# Patient Record
Sex: Male | Born: 1960 | Race: White | Hispanic: No | Smoking: Former smoker
Health system: Southern US, Community
[De-identification: ages and names within clinical notes are randomized; demographics above are authoritative.]

## PROBLEM LIST (undated history)

## (undated) DIAGNOSIS — I739 Peripheral vascular disease, unspecified: Secondary | ICD-10-CM

## (undated) DIAGNOSIS — E119 Type 2 diabetes mellitus without complications: Secondary | ICD-10-CM

---

## 2018-06-10 ENCOUNTER — Ambulatory Visit: Payer: Self-pay | Admitting: Family Medicine

## 2018-06-10 ENCOUNTER — Encounter (HOSPITAL_COMMUNITY): Payer: Self-pay | Admitting: Emergency Medicine

## 2018-06-10 ENCOUNTER — Inpatient Hospital Stay (HOSPITAL_COMMUNITY)
Admission: EM | Admit: 2018-06-10 | Discharge: 2018-06-16 | DRG: 623 | Disposition: A | Discharge status: Home / Self Care | Payer: Self-pay | Attending: Internal Medicine | Admitting: Internal Medicine

## 2018-06-10 ENCOUNTER — Encounter: Payer: Self-pay | Admitting: Family Medicine

## 2018-06-10 ENCOUNTER — Other Ambulatory Visit: Payer: Self-pay

## 2018-06-10 ENCOUNTER — Emergency Department (HOSPITAL_COMMUNITY): Payer: Self-pay

## 2018-06-10 VITALS — BP 145/78 | HR 83 | Temp 98.9°F | Resp 18 | Ht 71.0 in | Wt 152.4 lb

## 2018-06-10 DIAGNOSIS — Z6821 Body mass index (BMI) 21.0-21.9, adult: Secondary | ICD-10-CM

## 2018-06-10 DIAGNOSIS — Z833 Family history of diabetes mellitus: Secondary | ICD-10-CM

## 2018-06-10 DIAGNOSIS — E119 Type 2 diabetes mellitus without complications: Secondary | ICD-10-CM

## 2018-06-10 DIAGNOSIS — E1165 Type 2 diabetes mellitus with hyperglycemia: Secondary | ICD-10-CM | POA: Diagnosis present

## 2018-06-10 DIAGNOSIS — L02619 Cutaneous abscess of unspecified foot: Secondary | ICD-10-CM

## 2018-06-10 DIAGNOSIS — I998 Other disorder of circulatory system: Secondary | ICD-10-CM | POA: Diagnosis present

## 2018-06-10 DIAGNOSIS — E1151 Type 2 diabetes mellitus with diabetic peripheral angiopathy without gangrene: Secondary | ICD-10-CM | POA: Diagnosis present

## 2018-06-10 DIAGNOSIS — S92912A Unspecified fracture of left toe(s), initial encounter for closed fracture: Secondary | ICD-10-CM | POA: Diagnosis present

## 2018-06-10 DIAGNOSIS — S91302A Unspecified open wound, left foot, initial encounter: Secondary | ICD-10-CM

## 2018-06-10 DIAGNOSIS — W25XXXA Contact with sharp glass, initial encounter: Secondary | ICD-10-CM | POA: Diagnosis present

## 2018-06-10 DIAGNOSIS — X58XXXA Exposure to other specified factors, initial encounter: Secondary | ICD-10-CM | POA: Diagnosis present

## 2018-06-10 DIAGNOSIS — K08409 Partial loss of teeth, unspecified cause, unspecified class: Secondary | ICD-10-CM | POA: Diagnosis present

## 2018-06-10 DIAGNOSIS — E440 Moderate protein-calorie malnutrition: Secondary | ICD-10-CM

## 2018-06-10 DIAGNOSIS — F17200 Nicotine dependence, unspecified, uncomplicated: Secondary | ICD-10-CM | POA: Diagnosis present

## 2018-06-10 DIAGNOSIS — L02612 Cutaneous abscess of left foot: Secondary | ICD-10-CM | POA: Diagnosis present

## 2018-06-10 DIAGNOSIS — L03119 Cellulitis of unspecified part of limb: Secondary | ICD-10-CM

## 2018-06-10 DIAGNOSIS — S91332A Puncture wound without foreign body, left foot, initial encounter: Secondary | ICD-10-CM | POA: Diagnosis present

## 2018-06-10 DIAGNOSIS — R0989 Other specified symptoms and signs involving the circulatory and respiratory systems: Secondary | ICD-10-CM

## 2018-06-10 DIAGNOSIS — I70202 Unspecified atherosclerosis of native arteries of extremities, left leg: Secondary | ICD-10-CM | POA: Diagnosis present

## 2018-06-10 DIAGNOSIS — E11628 Type 2 diabetes mellitus with other skin complications: Principal | ICD-10-CM | POA: Diagnosis present

## 2018-06-10 DIAGNOSIS — I10 Essential (primary) hypertension: Secondary | ICD-10-CM | POA: Diagnosis present

## 2018-06-10 DIAGNOSIS — L97509 Non-pressure chronic ulcer of other part of unspecified foot with unspecified severity: Secondary | ICD-10-CM

## 2018-06-10 DIAGNOSIS — E11621 Type 2 diabetes mellitus with foot ulcer: Secondary | ICD-10-CM

## 2018-06-10 DIAGNOSIS — L03116 Cellulitis of left lower limb: Secondary | ICD-10-CM | POA: Diagnosis present

## 2018-06-10 DIAGNOSIS — S025XXA Fracture of tooth (traumatic), initial encounter for closed fracture: Secondary | ICD-10-CM | POA: Diagnosis present

## 2018-06-10 DIAGNOSIS — I771 Stricture of artery: Secondary | ICD-10-CM | POA: Diagnosis present

## 2018-06-10 DIAGNOSIS — R739 Hyperglycemia, unspecified: Secondary | ICD-10-CM | POA: Diagnosis present

## 2018-06-10 DIAGNOSIS — L0390 Cellulitis, unspecified: Secondary | ICD-10-CM | POA: Diagnosis present

## 2018-06-10 LAB — CBC WITH DIFFERENTIAL/PLATELET
Basophils Absolute: 0.1 10*3/uL (ref 0.0–0.1)
Basophils Relative: 0 %
Eosinophils Absolute: 0.1 10*3/uL (ref 0.0–0.7)
Eosinophils Relative: 1 %
HCT: 45.4 % (ref 39.0–52.0)
Hemoglobin: 15.9 g/dL (ref 13.0–17.0)
Lymphocytes Relative: 19 %
Lymphs Abs: 2.4 10*3/uL (ref 0.7–4.0)
MCH: 31.9 pg (ref 26.0–34.0)
MCHC: 35 g/dL (ref 30.0–36.0)
MCV: 91 fL (ref 78.0–100.0)
Monocytes Absolute: 1.1 10*3/uL — ABNORMAL HIGH (ref 0.1–1.0)
Monocytes Relative: 9 %
Neutro Abs: 9.3 10*3/uL — ABNORMAL HIGH (ref 1.7–7.7)
Neutrophils Relative %: 71 %
Platelets: 382 10*3/uL (ref 150–400)
RBC: 4.99 MIL/uL (ref 4.22–5.81)
RDW: 11.9 % (ref 11.5–15.5)
WBC: 12.9 10*3/uL — ABNORMAL HIGH (ref 4.0–10.5)

## 2018-06-10 LAB — CG4 I-STAT (LACTIC ACID)
Lactic Acid, Venous: 2.56 mmol/L (ref 0.5–1.9)
Lactic Acid, Venous: 2.41 mmol/L (ref 0.5–1.9)

## 2018-06-10 LAB — COMPREHENSIVE METABOLIC PANEL
ALT: 14 U/L (ref 0–44)
AST: 17 U/L (ref 15–41)
Albumin: 3.7 g/dL (ref 3.5–5.0)
Alkaline Phosphatase: 111 U/L (ref 38–126)
Anion gap: 15 (ref 5–15)
BUN: 13 mg/dL (ref 6–20)
CO2: 26 mmol/L (ref 22–32)
Calcium: 10 mg/dL (ref 8.9–10.3)
Chloride: 96 mmol/L — ABNORMAL LOW (ref 98–111)
Creatinine, Ser: 0.9 mg/dL (ref 0.61–1.24)
GFR calc Af Amer: >60 mL/min (ref >60)
GFR calc non Af Amer: >60 mL/min (ref >60)
Glucose, Bld: 250 mg/dL — ABNORMAL HIGH (ref 70–99)
Potassium: 4.6 mmol/L (ref 3.5–5.1)
Sodium: 137 mmol/L (ref 135–145)
Total Bilirubin: 0.6 mg/dL (ref 0.3–1.2)
Total Protein: 8.7 g/dL — ABNORMAL HIGH (ref 6.5–8.1)

## 2018-06-10 MED ORDER — SODIUM CHLORIDE 0.9 % IV SOLN
INTRAVENOUS | Status: AC
  Administered 2018-06-11: 01:00:00 via INTRAVENOUS

## 2018-06-10 MED ORDER — HYDROCODONE-ACETAMINOPHEN 5-325 MG PO TABS: 1.0000 | ORAL_TABLET | ORAL | Status: DC | PRN

## 2018-06-10 MED ORDER — ONDANSETRON HCL 4 MG/2ML IJ SOLN: 4.0000 mg | Freq: Four times a day (QID) | INTRAMUSCULAR | Status: DC | PRN

## 2018-06-10 MED ORDER — ONDANSETRON HCL 4 MG PO TABS: 4.0000 mg | ORAL_TABLET | Freq: Four times a day (QID) | ORAL | Status: DC | PRN

## 2018-06-10 MED ORDER — INSULIN ASPART 100 UNIT/ML ~~LOC~~ SOLN
0.0000 [IU] | SUBCUTANEOUS | Status: DC
  Administered 2018-06-11: 2 [IU] via SUBCUTANEOUS
  Administered 2018-06-11: 5 [IU] via SUBCUTANEOUS
  Administered 2018-06-11: 2 [IU] via SUBCUTANEOUS
  Administered 2018-06-11 (×2): 1 [IU] via SUBCUTANEOUS
  Administered 2018-06-12: 2 [IU] via SUBCUTANEOUS
  Administered 2018-06-12 (×2): 1 [IU] via SUBCUTANEOUS
  Administered 2018-06-13 (×3): 2 [IU] via SUBCUTANEOUS
  Administered 2018-06-14 (×3): 1 [IU] via SUBCUTANEOUS

## 2018-06-10 MED ORDER — SODIUM CHLORIDE 0.9 % IV BOLUS
1000.0000 mL | Freq: Once | INTRAVENOUS | Status: AC
  Administered 2018-06-10: 1000 mL via INTRAVENOUS

## 2018-06-10 MED ORDER — ENOXAPARIN SODIUM 40 MG/0.4ML ~~LOC~~ SOLN
40.0000 mg | Freq: Every day | SUBCUTANEOUS | Status: DC
  Administered 2018-06-11: 40 mg via SUBCUTANEOUS
  Filled 2018-06-10: qty 0.4

## 2018-06-10 MED ORDER — ACETAMINOPHEN 325 MG PO TABS: 650.0000 mg | ORAL_TABLET | Freq: Four times a day (QID) | ORAL | Status: DC | PRN

## 2018-06-10 MED ORDER — PIPERACILLIN-TAZOBACTAM 3.375 G IVPB 30 MIN
3.3750 g | Freq: Once | INTRAVENOUS | Status: AC
  Administered 2018-06-10: 3.375 g via INTRAVENOUS
  Filled 2018-06-10: qty 50

## 2018-06-10 MED ORDER — METRONIDAZOLE IN NACL 5-0.79 MG/ML-% IV SOLN
500.0000 mg | Freq: Three times a day (TID) | INTRAVENOUS | Status: DC
  Administered 2018-06-11 – 2018-06-14 (×9): 500 mg via INTRAVENOUS
  Filled 2018-06-10 (×9): qty 100

## 2018-06-10 MED ORDER — TETANUS-DIPHTH-ACELL PERTUSSIS 5-2.5-18.5 LF-MCG/0.5 IM SUSP
0.5000 mL | Freq: Once | INTRAMUSCULAR | Status: AC
  Administered 2018-06-10: 0.5 mL via INTRAMUSCULAR
  Filled 2018-06-10: qty 0.5

## 2018-06-10 MED ORDER — VANCOMYCIN HCL 10 G IV SOLR
1500.0000 mg | Freq: Once | INTRAVENOUS | Status: AC
  Administered 2018-06-10: 1500 mg via INTRAVENOUS
  Filled 2018-06-10: qty 1500

## 2018-06-10 MED ORDER — SODIUM CHLORIDE 0.9 % IV SOLN
2.0000 g | Freq: Every day | INTRAVENOUS | Status: DC
  Administered 2018-06-11 – 2018-06-14 (×4): 2 g via INTRAVENOUS
  Filled 2018-06-10 (×2): qty 2
  Filled 2018-06-10: qty 20
  Filled 2018-06-10: qty 2

## 2018-06-10 MED ORDER — ACETAMINOPHEN 650 MG RE SUPP: 650.0000 mg | Freq: Four times a day (QID) | RECTAL | Status: DC | PRN

## 2018-06-10 NOTE — Progress Notes (Signed)
Pharmacy Note   A consult was received from an ED physician for vancomycin per pharmacy dosing.    The patient's profile has been reviewed for ht/wt/allergies/indication/available labs.    A one time order has been placed for vancomycin 1500 mg IV x1 .  Further antibiotics/pharmacy consults should be ordered by admitting physician if indicated.                       Thank you,  Royetta Asal, PharmD, BCPS Pager [phone] 06/10/2018 9:12 PM

## 2018-06-10 NOTE — ED Provider Notes (Signed)
[facility] DEPT Provider Note   CSN: [id number] Arrival date & time: 06/10/18  1805     History   Chief Complaint Chief Complaint  Patient presents with  . Foot Swelling    HPI Stanley Chaney is a 57 y.o. male presents today for evaluation of acute onset, progressively worsening left foot pain and wound for 1 week.  Sent by PCP for further evaluation.  He states that he was visiting his son about a week ago when he began to feel pain and swelling in his left foot.  He applied ice over the area without relief in his symptoms.  Since then he has had progressively worsening throbbing pain.  Today he noticed really drainage from a wound which he thinks has been developing over the last week.  He denies any known trauma.  Notes numbness and tingling to the area as well which he thinks is due to swelling.  Denies fevers, chills, nausea, vomiting, diaphoresis.  He has been living in the [place] for the past 20 years and has not been seen by a physician in at least that amount of time.  Went to see a primary care physician earlier today for evaluation who promptly sent him to the ED for further evaluation. At the time, he documented there "appears to be a puncture wound at base of the foot at the arch with surrounding blistering and air under the skin that is draining sanguinous fluid with slight odor as well as some thin yellow exudate, does have a bullous type lesions centrally with some fluid under the skin medially, diffuse soft tissue swelling throughout mid foot that moves up to the ankle as well as slightly to the lateral foot, diffuse erythema across the foot primarily medial foot to the medial ankle". He is primarily [place] speaking, a Optometrist was used throughout the encounter.  The history is provided by the patient. The history is limited by a language barrier. A language interpreter was used.    History reviewed. No pertinent past medical  history.  Patient Active Problem List   Diagnosis Date Noted  . Cellulitis of left foot 06/10/2018  . Diabetes mellitus ([place]) 06/10/2018  . Hyperglycemia 06/10/2018  . Cellulitis 06/10/2018    History reviewed. No pertinent surgical history.      Home Medications    Prior to Admission medications   Medication Sig Start Date End Date Taking? Authorizing Provider  acetaminophen (TYLENOL) 500 MG tablet Take 500 mg by mouth every 6 (six) hours as needed.   Yes [provider]    Family History Family History  Problem Relation Age of Onset  . Diabetes Mother   . Cancer Mother     Social History Social History   Tobacco Use  . Smoking status: Current Every Day Smoker  . Smokeless tobacco: Never Used  Substance Use Topics  . Alcohol use: Not Currently  . Drug use: Not on file     Allergies   Patient has no known allergies.   Review of Systems Review of Systems  Constitutional: Negative for chills, diaphoresis and fever.  Musculoskeletal: Positive for arthralgias.  Skin: Positive for color change and wound.  Neurological: Positive for numbness.  All other systems reviewed and are negative.    Physical Exam Updated Vital Signs BP (!) 153/94 (BP Location: Left Arm)   Pulse 87   Temp (!) 97.5 F (36.4 C) (Oral)   Resp 18   SpO2 100%   Physical Exam  Constitutional: He appears well-developed and well-nourished. No distress.  HENT:  Head: Normocephalic and atraumatic.  Eyes: Conjunctivae are normal. Right eye exhibits no discharge. Left eye exhibits no discharge.  Neck: No JVD present. No tracheal deviation present.  Cardiovascular: Normal rate and intact distal pulses.  2+ DP/PT pulses bilaterally.  1+ pitting edema of the left lower extremity near the ankle  Pulmonary/Chest: Effort normal.  Abdominal: He exhibits no distension.  Musculoskeletal: He exhibits no edema.  5/5 strength of BLE major muscle groups. onychomycotic toes bilaterally.   Neurological: He is alert.  Skin: Skin is warm and dry. Capillary refill takes less than 2 seconds. There is erythema.  See below images.  Patient has what appears to be a puncture wound to the plantar aspect of the left medial foot with blistering extending to the medial arch and erythema and swelling extending to the medial aspect of the foot and distal lower leg.  Tender to palpation overlying this area.  No crepitus noted.  Draining serosanguineous fluid  Psychiatric: He has a normal mood and affect. His behavior is normal.  Nursing note and vitals reviewed.        ED Treatments / Results  Labs (all labs ordered are listed, but only abnormal results are displayed) Labs Reviewed  COMPREHENSIVE METABOLIC PANEL - Abnormal; Notable for the following components:      Result Value   Chloride 96 (*)    Glucose, Bld 250 (*)    Total Protein 8.7 (*)    All other components within normal limits  CBC WITH DIFFERENTIAL/PLATELET - Abnormal; Notable for the following components:   WBC 12.9 (*)    Neutro Abs 9.3 (*)    Monocytes Absolute 1.1 (*)    All other components within normal limits  CG4 I-STAT (LACTIC ACID) - Abnormal; Notable for the following components:   Lactic Acid, Venous 2.56 (*)    All other components within normal limits  CG4 I-STAT (LACTIC ACID) - Abnormal; Notable for the following components:   Lactic Acid, Venous 2.41 (*)    All other components within normal limits  CULTURE, BLOOD (ROUTINE X 2)  CULTURE, BLOOD (ROUTINE X 2)  URINALYSIS, ROUTINE W REFLEX MICROSCOPIC  LIPID PANEL  I-STAT CG4 LACTIC ACID, ED  I-STAT CG4 LACTIC ACID, ED    EKG None  Radiology Dg Foot Complete Left  Result Date: 06/10/2018 CLINICAL DATA:  Plantar foot wound, swelling EXAM: LEFT FOOT - COMPLETE 3+ VIEW COMPARISON:  None. FINDINGS: Fracture involving the proximal shaft of the 5th proximal phalanx. Mild degenerative changes involving the 1st MTP joint. Visualized soft tissues are  grossly unremarkable in this patient with known plantar soft tissue wound. No radiopaque foreign body is seen. IMPRESSION: Fracture involving the proximal shaft of the 5th proximal phalanx. Electronically Signed   By: Julian Hy M.D.   On: 06/10/2018 19:20    Procedures Procedures (including critical care time) EMERGENCY DEPARTMENT US SOFT TISSUE INTERPRETATION "Study: Limited Soft Tissue Ultrasound"  INDICATIONS: Soft tissue infection Multiple views of the body part were obtained in real-time with a multi-frequency linear probe  PERFORMED BY: Myself IMAGES ARCHIVED?: Yes SIDE:Left BODY PART:Lower extremity INTERPRETATION:  Abcess present, Cellulitis present and possible foreign body    Medications Ordered in ED Medications  vancomycin (VANCOCIN) 1,500 mg in sodium chloride 0.9 % 500 mL IVPB (1,500 mg Intravenous Transfusing/Transfer 06/10/18 2322)  sodium chloride 0.9 % bolus 1,000 mL (0 mLs Intravenous Stopped 06/10/18 2305)  Tdap (BOOSTRIX) injection 0.5 mL (  0.5 mLs Intramuscular Given 06/10/18 2140)  piperacillin-tazobactam (ZOSYN) IVPB 3.375 g (0 g Intravenous Stopped 06/10/18 2218)     Initial Impression / Assessment and Plan / ED Course  I have reviewed the triage vital signs and the nursing notes.  Pertinent labs & imaging results that were available during my care of the patient were reviewed by me and considered in my medical decision making (see chart for details).     Patient presents for evaluation of progressively worsening left lower extremity puncture wound with surrounding cellulitis.  He is afebrile, mildly hypertensive in the ED.  Decreased sensation noted on palpation of the foot though he is tender to palpation surrounding the infected area.  Found to have a leukocytosis of 12.9, elevated blood glucose of 250 suggesting undiagnosed diabetes.  He also has an elevated lactic acid of 2.56.  Blood cultures were obtained and IV antibiotics were initiated.   Bedside ultrasound performed showing possible foreign body.  Will require admission for further evaluation and management.  Spoke with Dr. Roel Cluck who agrees to assume care of patient and bring him into the hospital for further evaluation and management. She will consult orthopedics for further recommendations. Patient seen and evaluated by Dr. Winfred Leeds who agrees with assessment and plan at this time.   Final Clinical Impressions(s) / ED Diagnoses   Final diagnoses:  Foot abscess, left  Cellulitis of left lower extremity    ED Discharge Orders    None       Debroah Baller 06/10/18 2339    Orlie Dakin, MD 06/10/18 2351

## 2018-06-10 NOTE — ED Triage Notes (Signed)
Pt sent from [facility] for left foot swelling and possible infection for week. Reports very painful.

## 2018-06-10 NOTE — ED Notes (Signed)
ED TO INPATIENT HANDOFF REPORT  Name/Age/Gender Stanley Chaney 57 y.o. male  Code Status   Home/SNF/Other Home  Chief Complaint Left leg swelling  Level of Care/Admitting Diagnosis ED Disposition    ED Disposition Condition [facility] Area: [place] [[id number]]  Level of Care: Med-Surg [16]  Diagnosis: Cellulitis [563875]  Admitting Physician: Toy Baker [[id number]]  Attending Physician: Toy Baker [[id number]]  Estimated length of stay: past midnight tomorrow  Certification:: I certify this patient will need inpatient services for at least 2 midnights  PT Class (Do Not Modify): Inpatient [101]  PT Acc Code (Do Not Modify): Private [1]       Medical History History reviewed. No pertinent past medical history.  Allergies No Known Allergies  IV Location/Drains/Wounds Patient Lines/Drains/Airways Status   Active Line/Drains/Airways    Name:   Placement date:   Placement time:   Site:   Days:   Peripheral IV 06/10/18 Right;Medial Forearm   06/10/18    2136    Forearm   less than 1   Peripheral IV 06/10/18 Left Hand   06/10/18    2159    Hand   less than 1          Labs/Imaging Results for orders placed or performed during the hospital encounter of 06/10/18 (from the past 48 hour(s))  Comprehensive metabolic panel     Status: Abnormal   Collection Time: 06/10/18  7:08 PM  Result Value Ref Range   Sodium 137 135 - 145 mmol/L   Potassium 4.6 3.5 - 5.1 mmol/L   Chloride 96 (L) 98 - 111 mmol/L   CO2 26 22 - 32 mmol/L   Glucose, Bld 250 (H) 70 - 99 mg/dL   BUN 13 6 - 20 mg/dL   Creatinine, Ser 0.90 0.61 - 1.24 mg/dL   Calcium 10.0 8.9 - 10.3 mg/dL   Total Protein 8.7 (H) 6.5 - 8.1 g/dL   Albumin 3.7 3.5 - 5.0 g/dL   AST 17 15 - 41 U/L   ALT 14 0 - 44 U/L   Alkaline Phosphatase 111 38 - 126 U/L   Total Bilirubin 0.6 0.3 - 1.2 mg/dL   GFR calc non Af Amer >60 >60 mL/min   GFR calc Af Amer >60 >60 mL/min    Comment:  (NOTE) The eGFR has been calculated using the CKD EPI equation. This calculation has not been validated in all clinical situations. eGFR's persistently <60 mL/min signify possible Chronic Kidney Disease.    Anion gap 15 5 - 15    Comment: Performed at [facility], [place] [address] [place], [place] [address]  CBC with Differential     Status: Abnormal   Collection Time: 06/10/18  7:08 PM  Result Value Ref Range   WBC 12.9 (H) 4.0 - 10.5 K/uL   RBC 4.99 4.22 - 5.81 MIL/uL   Hemoglobin 15.9 13.0 - 17.0 g/dL   HCT 45.4 39.0 - 52.0 %   MCV 91.0 78.0 - 100.0 fL   MCH 31.9 26.0 - 34.0 pg   MCHC 35.0 30.0 - 36.0 g/dL   RDW 11.9 11.5 - 15.5 %   Platelets 382 150 - 400 K/uL   Neutrophils Relative % 71 %   Neutro Abs 9.3 (H) 1.7 - 7.7 K/uL   Lymphocytes Relative 19 %   Lymphs Abs 2.4 0.7 - 4.0 K/uL   Monocytes Relative 9 %   Monocytes Absolute 1.1 (H) 0.1 - 1.0 K/uL  Eosinophils Relative 1 %   Eosinophils Absolute 0.1 0.0 - 0.7 K/uL   Basophils Relative 0 %   Basophils Absolute 0.1 0.0 - 0.1 K/uL    Comment: Performed at [facility], [place] [address] [place], [place] [address]  CG4 I-STAT (Lactic acid)     Status: Abnormal   Collection Time: 06/10/18  7:16 PM  Result Value Ref Range   Lactic Acid, Venous 2.56 (HH) 0.5 - 1.9 mmol/L   Comment NOTIFIED PHYSICIAN   CG4 I-STAT (Lactic acid)     Status: Abnormal   Collection Time: 06/10/18  8:46 PM  Result Value Ref Range   Lactic Acid, Venous 2.41 (HH) 0.5 - 1.9 mmol/L   Comment NOTIFIED PHYSICIAN    Dg Foot Complete Left  Result Date: 06/10/2018 CLINICAL DATA:  Plantar foot wound, swelling EXAM: LEFT FOOT - COMPLETE 3+ VIEW COMPARISON:  None. FINDINGS: Fracture involving the proximal shaft of the 5th proximal phalanx. Mild degenerative changes involving the 1st MTP joint. Visualized soft tissues are grossly unremarkable in this patient with known plantar soft tissue wound. No radiopaque foreign body  is seen. IMPRESSION: Fracture involving the proximal shaft of the 5th proximal phalanx. Electronically Signed   By: Julian Hy M.D.   On: 06/10/2018 19:20    Pending Labs Unresulted Labs (From admission, onward)    Start     Ordered   06/11/18 0500  Lipid panel  Tomorrow morning,   R     06/10/18 2234   06/10/18 2229  Urinalysis, Routine w reflex microscopic  Once,   R     06/10/18 2228   06/10/18 2110  Blood culture (routine x 2)  BLOOD CULTURE X 2,   STAT     06/10/18 2109   Signed and Held  Hemoglobin A1c  Once,   R     Signed and Held   Signed and Held  HIV antibody  Once,   R     Signed and Held   Signed and Held  Sedimentation rate  Once,   R     Signed and Held   Signed and Held  C-reactive protein  Once,   R     Signed and Held   Signed and Held  Prealbumin  Once,   R     Signed and Held   Signed and Held  Lipid panel  Tomorrow morning,   R     Signed and Held   Signed and Held  Hemoglobin A1c  Once,   R    Comments:  To assess prior glycemic control    Signed and Held   Signed and Held  Lactic acid, plasma  STAT Now then every 3 hours,   STAT     Signed and Held   Signed and Held  Procalcitonin  STAT,   R     Signed and Held   Signed and Held  Protime-INR  STAT,   R     Signed and Held   Signed and Held  APTT  STAT,   R     Signed and Held   Signed and Held  Magnesium  Tomorrow morning,   R    Comments:  Call MD if <1.5    Signed and Held   Signed and Held  Phosphorus  Tomorrow morning,   R     Signed and Held   Signed and Held  TSH  Once,   R    Comments:  Cancel if already done within 1 month and notify MD    Signed and Held   Signed and Held  Comprehensive metabolic panel  Once,   R    Comments:  Cal MD for K<3.5 or >5.0    Signed and Held   Signed and Held  CBC  Once,   R    Comments:  Call for hg <8.0    Signed and Held   Signed and Held  MRSA PCR Screening  Once,   R    Question:  Patient immune status  Answer:  Normal   Signed and Held    Signed and Held  HIV Antibody (routine testing w rflx)  Tomorrow morning,   R     Signed and Held          Vitals/Pain Today's Vitals   06/10/18 2108 06/10/18 2130 06/10/18 2204 06/10/18 2206  BP: (!) 142/71  (!) 152/77   Pulse: 73  73   Resp: 14  18   Temp:      TempSrc:      SpO2: 98%  97%   PainSc:  0-No pain  0-No pain    Isolation Precautions No active isolations  Medications Medications  vancomycin (VANCOCIN) 1,500 mg in sodium chloride 0.9 % 500 mL IVPB (1,500 mg Intravenous New Bag/Given 06/10/18 2202)  sodium chloride 0.9 % bolus 1,000 mL (1,000 mLs Intravenous New Bag/Given 06/10/18 2138)  Tdap (BOOSTRIX) injection 0.5 mL (0.5 mLs Intramuscular Given 06/10/18 2140)  piperacillin-tazobactam (ZOSYN) IVPB 3.375 g (0 g Intravenous Stopped 06/10/18 2218)    Mobility walks

## 2018-06-10 NOTE — Patient Instructions (Addendum)
   Go to emergency room after leaving our office for further evaluation and treatment of your foot.    If you have lab work done today you will be contacted with your lab results within the next 2 weeks.  If you have not heard from [place] then please contact us. The fastest way to get your results is to register for My Chart.   IF you received an x-ray today, you will receive an invoice from [facility]. Please contact [facility] at [phone] with questions or concerns regarding your invoice.   IF you received labwork today, you will receive an invoice from [facility]. Please contact [facility] at [phone] with questions or concerns regarding your invoice.   Our billing staff will not be able to assist you with questions regarding bills from these companies.  You will be contacted with the lab results as soon as they are available. The fastest way to get your results is to activate your My Chart account. Instructions are located on the last page of this paperwork. If you have not heard from [place] regarding the results in 2 weeks, please contact this office.

## 2018-06-10 NOTE — H&P (Signed)
Stanley Chaney XKG:[id number] DOB: 03-Aug-1961 DOA: 06/10/2018   PCP: Patient, No Pcp Per   Outpatient Specialists:  NONE    Patient arrived to ER on 06/10/18 at [place]  Patient coming from: home Lives  With family    Chief Complaint:  Chief Complaint  Patient presents with  . Foot Swelling    HPI: Stanley Chaney is a 57 y.o. male with no known medical history      Presented with   Puncture wound followed by swelling He is unsure what happened. Have never seen a doctor for the past 21 years.   One week ago he could have stepped on glass while visiting his son He applied ice but the foot continued to swell for the past 4 days he had severe pain which prevented him from sleeping and today started to have drainage no fevers or chills  Reports weight loss, increased thirst No blurred vision.   While in ER: Leukocytosis Noted to have BG 250 Remote frx 5th phalynx The following Work up has been ordered so far:  Orders Placed This Encounter  Procedures  . Blood culture (routine x 2)  . DG Foot Complete Left  . Comprehensive metabolic panel  . CBC with Differential  . Saline Lock IV, Maintain IV access  . I-Stat CG4 Lactic Acid, ED  . CG4 I-STAT (Lactic acid)  . CG4 I-STAT (Lactic acid)   Following Medications were ordered in ER: Medications  vancomycin (VANCOCIN) 1,500 mg in sodium chloride 0.9 % 500 mL IVPB (1,500 mg Intravenous New Bag/Given 06/10/18 2202)  sodium chloride 0.9 % bolus 1,000 mL (1,000 mLs Intravenous New Bag/Given 06/10/18 2138)  Tdap (BOOSTRIX) injection 0.5 mL (0.5 mLs Intramuscular Given 06/10/18 2140)  piperacillin-tazobactam (ZOSYN) IVPB 3.375 g (3.375 g Intravenous New Bag/Given 06/10/18 2145)    Significant initial  Findings: Abnormal Labs Reviewed  COMPREHENSIVE METABOLIC PANEL - Abnormal; Notable for the following components:      Result Value   Chloride 96 (*)    Glucose, Bld 250 (*)    Total Protein 8.7 (*)    All other components  within normal limits  CBC WITH DIFFERENTIAL/PLATELET - Abnormal; Notable for the following components:   WBC 12.9 (*)    Neutro Abs 9.3 (*)    Monocytes Absolute 1.1 (*)    All other components within normal limits  CG4 I-STAT (LACTIC ACID) - Abnormal; Notable for the following components:   Lactic Acid, Venous 2.56 (*)    All other components within normal limits  CG4 I-STAT (LACTIC ACID) - Abnormal; Notable for the following components:   Lactic Acid, Venous 2.41 (*)    All other components within normal limits     Na 137 K 4.6  Cr    Lab Results  Component Value Date   CREATININE 0.90 06/10/2018    WBC  12.9  HG/HCT   Stable,     Component Value Date/Time   HGB 15.9 06/10/2018 1908   HCT 45.4 06/10/2018 1908    Lactic Acid, Venous    Component Value Date/Time   LATICACIDVEN 2.41 (HH) 06/10/2018 2046     UA  ordered   Left foot showing fracture involving proximal shaft of the fifth proximal phalanx no radiopaque foreign body  ECG:  Not obtained    ED Triage Vitals  Enc Vitals Group     BP 06/10/18 1824 128/74     Pulse Rate 06/10/18 1824 80     Resp 06/10/18 1824 17  Temp 06/10/18 1824 (!) 97.5 F (36.4 C)     Temp Source 06/10/18 1824 Oral     SpO2 06/10/18 1824 98 %     Weight --      Height --      Head Circumference --      Peak Flow --      Pain Score 06/10/18 1825 10     Pain Loc --      Pain Edu? --      Excl. in [place]? --   TMAX(24)@       Latest  Blood pressure (!) 152/77, pulse 73, temperature (!) 97.5 F (36.4 C), temperature source Oral, resp. rate 18, SpO2 97 %.     Hospitalist was called for admission for left foot wound/cellulitis   Review of Systems:    Pertinent positives include: fatigue, left leg pain  Constitutional:  No weight loss, night sweats, Fevers, chills,  weight loss  HEENT:  No headaches, Difficulty swallowing,Tooth/dental problems,Sore throat,  No sneezing, itching, ear ache, nasal congestion, post nasal  drip,  Cardio-vascular:  No chest pain, Orthopnea, PND, anasarca, dizziness, palpitations.no Bilateral lower extremity swelling  GI:  No heartburn, indigestion, abdominal pain, nausea, vomiting, diarrhea, change in bowel habits, loss of appetite, melena, blood in stool, hematemesis Resp:  no shortness of breath at rest. No dyspnea on exertion, No excess mucus, no productive cough, No non-productive cough, No coughing up of blood.No change in color of mucus.No wheezing. Skin:  no rash or lesions. No jaundice GU:  no dysuria, change in color of urine, no urgency or frequency. No straining to urinate.  No flank pain.  Musculoskeletal:  No joint pain or no joint swelling. No decreased range of motion. No back pain.  Psych:  No change in mood or affect. No depression or anxiety. No memory loss.  Neuro: no localizing neurological complaints, no tingling, no weakness, no double vision, no gait abnormality, no slurred speech, no confusion  All systems reviewed and apart from [place] all are negative  Past Medical History:  History reviewed. No pertinent past medical history.    No past surgical history on file.  Social History:  Ambulatory  independently      reports that he has been smoking. He has never used smokeless tobacco. His alcohol and drug histories are not on file.     Family History:   Family History  Problem Relation Age of Onset  . Diabetes Mother   . Cancer Mother     Allergies: No Known Allergies   Prior to Admission medications   Medication Sig Start Date End Date Taking? Authorizing Provider  acetaminophen (TYLENOL) 500 MG tablet Take 500 mg by mouth every 6 (six) hours as needed.   Yes [provider]   Physical Exam: Blood pressure (!) 152/77, pulse 73, temperature (!) 97.5 F (36.4 C), temperature source Oral, resp. rate 18, SpO2 97 %. 1. General:  in No Acute distress  well  -appearing 2. Psychological: Alert and   Oriented 3. Head/ENT:     Dry Mucous Membranes                          Head Non traumatic, neck supple                            Poor Dentition 4. SKIN: decreased Skin turgor,  Skin clean Dry eft foot swollen with draining  wound,wounds on feet bilaterally With diminished sensation          5. Heart: Regular rate and rhythm no Murmur, no Rub or gallop 6. Lungs: Distant breath sounds no wheezes or crackles   7. Abdomen: Soft, non-tender, Non distended bowel sounds present 8. Lower extremities: no clubbing, cyanosis, or  edema 9. Neurologically Grossly intact, moving all 4 extremities equally  10. MSK: Normal range of motion   LABS:     Recent Labs  Lab 06/10/18 1908  WBC 12.9*  NEUTROABS 9.3*  HGB 15.9  HCT 45.4  MCV 91.0  PLT 182   Basic Metabolic Panel: Recent Labs  Lab 06/10/18 1908  NA 137  K 4.6  CL 96*  CO2 26  GLUCOSE 250*  BUN 13  CREATININE 0.90  CALCIUM 10.0      Recent Labs  Lab 06/10/18 1908  AST 17  ALT 14  ALKPHOS 111  BILITOT 0.6  PROT 8.7*  ALBUMIN 3.7   No results for input(s): LIPASE, AMYLASE in the last 168 hours. No results for input(s): AMMONIA in the last 168 hours.    HbA1C: No results for input(s): HGBA1C in the last 72 hours. CBG: No results for input(s): GLUCAP in the last 168 hours.    Urine analysis: No results found for: COLORURINE, APPEARANCEUR, LABSPEC, PHURINE, GLUCOSEU, HGBUR, BILIRUBINUR, KETONESUR, PROTEINUR, UROBILINOGEN, NITRITE, LEUKOCYTESUR     Cultures: No results found for: [place], [place], CULT, REPTSTATUS   Radiological Exams on Admission: Dg Foot Complete Left  Result Date: 06/10/2018 CLINICAL DATA:  Plantar foot wound, swelling EXAM: LEFT FOOT - COMPLETE 3+ VIEW COMPARISON:  None. FINDINGS: Fracture involving the proximal shaft of the 5th proximal phalanx. Mild degenerative changes involving the 1st MTP joint. Visualized soft tissues are grossly unremarkable in this patient with known plantar soft tissue wound. No  radiopaque foreign body is seen. IMPRESSION: Fracture involving the proximal shaft of the 5th proximal phalanx. Electronically Signed   By: Julian Hy M.D.   On: 06/10/2018 19:20    Chart has been reviewed    Assessment/Plan   57 y.o. male with no known medical history   Admitted for left foot wound/cellulitis in the setting of new diagnosis of diabetes mellitus with hyperglycemia  Present on Admission: . Cellulitis of left foot --admit per cellulitis protocol will           continue current antibiotic choice      plain films showed:   no evidence of air no evidence of osteomyelitis  no   foreign   objects       tetanus shot has been updated.      Will obtain MRSA screening,   obtain blood cultures if febrile or septic     further antibiotic adjustment pending above results       Consult orthopedics in a.m. to see if MRI would be beneficial for further investigation of the foot ulcer Order sed rate and CRP ABIs Ultrasound at bedside was done showed no definitive foreign body but inconcclusive . Hyperglycemia likely new diagnosis of diabetes mellitus order hemoglobin A1c TSH sliding scale and diabetes education Tobacco abuse order tobacco cessation protocol patient is 40+ smoking history book about importance of quitting Given extensive tobacco abuse and distant breath sounds with recent weight loss will obtain chest x-ray Elevated blood pressure -in the setting of pain if continues to persist may need antihypertensives Other plan as per orders. Sepsis given leukocytosis elevated lactic acid will monitor vital signs  obtain serial lactic acid order IV antibiotics and rehydrate DVT prophylaxis:  SCD     Code Status:  FULL CODE  as per patient   I had personally discussed CODE STATUS with patient    Family Communication:   Family not  at  Bedside    Disposition Plan:  To home once workup is complete and patient is stable   Admission status:  inpatient     Expect 2  midnight stay secondary to severity of patient's current illness including    Severe lab abnormalities including elevated lactic acid and extensive comorbidities including:  DM2   That are currently affecting medical management.  I expect  patient to be hospitalized for 2 midnights requiring inpatient medical care.  Patient is at high risk for adverse outcome (such as loss of life or disability) if not treated.  Indication for inpatient stay as follows:     Need for IV antibiotics, IV fluids,      Level of care        medical floor    Shamone Winzer 06/11/2018, 1:30 AM    [facility]  Pager [phone]   after 2 AM please page floor coverage PA If 7AM-7PM, please contact the day team taking care of the patient  [email or web address]  Password [email or web address]

## 2018-06-10 NOTE — ED Provider Notes (Signed)
Left foot swelling and pain for the past 1 week.  Pain is worse with weightbearing pain is severe.  He denies fever denies vomiting.  Denies history of diabetes.  On exam alert nontoxic left lower extremity with open wound at plantar surface.  There is no tenderness at the fifth toe.  Doubt acute fracture.   Orlie Dakin, MD 06/10/18 2103

## 2018-06-10 NOTE — Progress Notes (Signed)
Subjective:  By signing my name below, I, Stanley Chaney, attest that this documentation has been prepared under the direction and in the presence of [name], MD. Electronically Signed: [name], [address]. 06/10/2018 , 5:36 PM .  Patient was seen in Room 3 .   Patient ID: Stanley Chaney, male    DOB: November 03, 1960, 57 y.o.   MRN: [id number] Chief Complaint  Patient presents with  . Foot Pain    left foot has bubble on bottom    HPI Stanley Chaney is a 57 y.o. male Here for left foot issue. Patient states he was visiting his son about a week ago. At the time, he felt pain in his foot, and noticed swelling at that time. He had applied ice over the area. But in the past 4 days, he's had more pain and soreness where he's unable to sleep. Today is the first day he noticed the drainage. He denies fever or chills. He denies history of diabetes. He believes he might have a piece of glass in his foot.   Patient's primary language is [place]. [employer] video interpreter for translation, [id number].   There are no active problems to display for this patient.  No past medical history on file.  No Known Allergies Prior to Admission medications   Not on File   Social History   Socioeconomic History  . Marital status: Married    Spouse name: Not on file  . Number of children: Not on file  . Years of education: Not on file  . Highest education level: Not on file  Occupational History  . Not on file  Social Needs  . Financial resource strain: Not on file  . Food insecurity:    Worry: Not on file    Inability: Not on file  . Transportation needs:    Medical: Not on file    Non-medical: Not on file  Tobacco Use  . Smoking status: Current Every Day Smoker  . Smokeless tobacco: Never Used  Substance and Sexual Activity  . Alcohol use: Not on file  . Drug use: Not on file  . Sexual activity: Not on file  Lifestyle  . Physical activity:    Days per week: Not on file   Minutes per session: Not on file  . Stress: Not on file  Relationships  . Social connections:    Talks on phone: Not on file    Gets together: Not on file    Attends religious service: Not on file    Active member of club or organization: Not on file    Attends meetings of clubs or organizations: Not on file    Relationship status: Not on file  . Intimate partner violence:    Fear of current or ex partner: Not on file    Emotionally abused: Not on file    Physically abused: Not on file    Forced sexual activity: Not on file  Other Topics Concern  . Not on file  Social History Narrative  . Not on file   Review of Systems  Constitutional: Negative for fatigue and unexpected weight change.  Eyes: Negative for visual disturbance.  Respiratory: Negative for cough, chest tightness and shortness of breath.   Cardiovascular: Negative for chest pain, palpitations and leg swelling.  Gastrointestinal: Negative for abdominal pain and Chaney in stool.  Musculoskeletal: Positive for myalgias (left foot).  Skin: Positive for wound.  Neurological: Negative for dizziness, light-headedness and headaches.  Objective:   Physical Exam  Constitutional: He is oriented to person, place, and time. He appears well-developed and well-nourished. No distress.  HENT:  Head: Normocephalic and atraumatic.  Eyes: Pupils are equal, round, and reactive to light. EOM are normal.  Neck: Neck supple.  Cardiovascular: Normal rate.  Pulmonary/Chest: Effort normal. No respiratory distress.  Musculoskeletal: Normal range of motion.  Neurological: He is alert and oriented to person, place, and time.  Skin: Skin is warm and dry.  Left foot: appears to be a puncture wound at base of the foot at the arch with surrounding blistering and air under the skin that is draining sanguinous fluid with slight odor as well as some thin yellow exudate, does have a bullous type lesions centrally with some fluid under the skin  medially, diffuse soft tissue swelling throughout mid foot that moves up to the ankle as well as slightly to the lateral foot, diffuse erythema across the foot primarily medial foot to the medial ankle  Psychiatric: He has a normal mood and affect. His behavior is normal.  Nursing note and vitals reviewed.   Vitals:   06/10/18 1714  BP: (!) 145/78  Pulse: 83  Resp: 18  Temp: 98.9 F (37.2 C)  TempSrc: Oral  SpO2: 97%  Weight: 152 lb 6.4 oz (69.1 kg)  Height: 5\' 11"  (1.803 m)       Assessment & Plan:  Stanley Chaney is a 56 y.o. male Wound, open, foot, left, initial encounter  Cellulitis and abscess of foot  One week history of foot swelling followed by open wound drainage today.  Suspect possible foreign body/puncture now with cellulitis/abscess and possibility of deep space infection given the amount of swelling and erythema extending to ankle.  Further evaluation through emergency room as possible imaging, IV antibiotics may be needed.  [place] interpreter by video used with understanding of plan expressed.  All questions answered.  No orders of the defined types were placed in this encounter.  Patient Instructions     Go to [facility] emergency room after leaving our office for further evaluation and treatment of your foot.    If you have lab work done today you will be contacted with your lab results within the next 2 weeks.  If you have not heard from [place] then please contact us. The fastest way to get your results is to register for My Chart.   IF you received an x-ray today, you will receive an invoice from [facility]. Please contact [facility] at [phone] with questions or concerns regarding your invoice.   IF you received labwork today, you will receive an invoice from [facility]. Please contact [facility] at [phone] with questions or concerns regarding your invoice.   Our billing staff will not be able to assist you with questions  regarding bills from these companies.  You will be contacted with the lab results as soon as they are available. The fastest way to get your results is to activate your My Chart account. Instructions are located on the last page of this paperwork. If you have not heard from [place] regarding the results in 2 weeks, please contact this office.      I personally performed the services described in this documentation, which was scribed in my presence. The recorded information has been reviewed and considered for accuracy and completeness, addended by me as needed, and agree with information above.  Signed,   [name], MD [facility].  06/10/18 5:40 PM

## 2018-06-11 ENCOUNTER — Other Ambulatory Visit: Payer: Self-pay

## 2018-06-11 ENCOUNTER — Inpatient Hospital Stay (HOSPITAL_COMMUNITY): Payer: Self-pay

## 2018-06-11 DIAGNOSIS — S92515A Nondisplaced fracture of proximal phalanx of left lesser toe(s), initial encounter for closed fracture: Secondary | ICD-10-CM

## 2018-06-11 DIAGNOSIS — L02612 Cutaneous abscess of left foot: Secondary | ICD-10-CM

## 2018-06-11 DIAGNOSIS — L0390 Cellulitis, unspecified: Secondary | ICD-10-CM

## 2018-06-11 DIAGNOSIS — E440 Moderate protein-calorie malnutrition: Secondary | ICD-10-CM

## 2018-06-11 LAB — URINALYSIS, ROUTINE W REFLEX MICROSCOPIC
Bacteria, UA: NONE SEEN
Bilirubin Urine: NEGATIVE
Glucose, UA: >=500 mg/dL — AB
Hgb urine dipstick: NEGATIVE
Ketones, ur: 20 mg/dL — AB
Leukocytes, UA: NEGATIVE
Nitrite: NEGATIVE
Protein, ur: NEGATIVE mg/dL
Specific Gravity, Urine: 1.016 (ref 1.005–1.030)
pH: 6 (ref 5.0–8.0)

## 2018-06-11 LAB — LACTIC ACID, PLASMA
Lactic Acid, Venous: 1.7 mmol/L (ref 0.5–1.9)
Lactic Acid, Venous: 1.8 mmol/L (ref 0.5–1.9)

## 2018-06-11 LAB — CBC
HCT: 40.3 % (ref 39.0–52.0)
Hemoglobin: 13.7 g/dL (ref 13.0–17.0)
MCH: 31.1 pg (ref 26.0–34.0)
MCHC: 34 g/dL (ref 30.0–36.0)
MCV: 91.4 fL (ref 78.0–100.0)
Platelets: 365 10*3/uL (ref 150–400)
RBC: 4.41 MIL/uL (ref 4.22–5.81)
RDW: 11.9 % (ref 11.5–15.5)
WBC: 11.1 10*3/uL — ABNORMAL HIGH (ref 4.0–10.5)

## 2018-06-11 LAB — COMPREHENSIVE METABOLIC PANEL
ALT: 12 U/L (ref 0–44)
AST: 13 U/L — ABNORMAL LOW (ref 15–41)
Albumin: 3 g/dL — ABNORMAL LOW (ref 3.5–5.0)
Alkaline Phosphatase: 87 U/L (ref 38–126)
Anion gap: 11 (ref 5–15)
BUN: 9 mg/dL (ref 6–20)
CO2: 28 mmol/L (ref 22–32)
Calcium: 9 mg/dL (ref 8.9–10.3)
Chloride: 103 mmol/L (ref 98–111)
Creatinine, Ser: 0.77 mg/dL (ref 0.61–1.24)
GFR calc Af Amer: >60 mL/min (ref >60)
GFR calc non Af Amer: >60 mL/min (ref >60)
Glucose, Bld: 155 mg/dL — ABNORMAL HIGH (ref 70–99)
Potassium: 4 mmol/L (ref 3.5–5.1)
Sodium: 142 mmol/L (ref 135–145)
Total Bilirubin: 0.9 mg/dL (ref 0.3–1.2)
Total Protein: 7 g/dL (ref 6.5–8.1)

## 2018-06-11 LAB — GLUCOSE, CAPILLARY
Glucose-Capillary: 128 mg/dL — ABNORMAL HIGH (ref 70–99)
Glucose-Capillary: 134 mg/dL — ABNORMAL HIGH (ref 70–99)
Glucose-Capillary: 145 mg/dL — ABNORMAL HIGH (ref 70–99)
Glucose-Capillary: 164 mg/dL — ABNORMAL HIGH (ref 70–99)
Glucose-Capillary: 178 mg/dL — ABNORMAL HIGH (ref 70–99)
Glucose-Capillary: 257 mg/dL — ABNORMAL HIGH (ref 70–99)

## 2018-06-11 LAB — LIPID PANEL
Cholesterol: 148 mg/dL (ref 0–200)
HDL: 28 mg/dL — ABNORMAL LOW (ref >40)
LDL Cholesterol: 100 mg/dL — ABNORMAL HIGH (ref 0–99)
Total CHOL/HDL Ratio: 5.3 RATIO
Triglycerides: 98 mg/dL (ref <150)
VLDL: 20 mg/dL (ref 0–40)

## 2018-06-11 LAB — HIV ANTIBODY (ROUTINE TESTING W REFLEX)
HIV Screen 4th Generation wRfx: NONREACTIVE
HIV Screen 4th Generation wRfx: NONREACTIVE

## 2018-06-11 LAB — PROTIME-INR
INR: 1.06
Prothrombin Time: 13.7 seconds (ref 11.4–15.2)

## 2018-06-11 LAB — TSH: TSH: 0.59 u[IU]/mL (ref 0.350–4.500)

## 2018-06-11 LAB — PHOSPHORUS: Phosphorus: 3.6 mg/dL (ref 2.5–4.6)

## 2018-06-11 LAB — APTT: aPTT: 32 seconds (ref 24–36)

## 2018-06-11 LAB — PROCALCITONIN: Procalcitonin: <0.1 ng/mL

## 2018-06-11 LAB — C-REACTIVE PROTEIN: CRP: 13.9 mg/dL — ABNORMAL HIGH (ref <1.0)

## 2018-06-11 LAB — SEDIMENTATION RATE: Sed Rate: 78 mm/hr — ABNORMAL HIGH (ref 0–16)

## 2018-06-11 LAB — MAGNESIUM: Magnesium: 2.1 mg/dL (ref 1.7–2.4)

## 2018-06-11 LAB — PREALBUMIN: Prealbumin: 7.6 mg/dL — ABNORMAL LOW (ref 18–38)

## 2018-06-11 LAB — MRSA PCR SCREENING: MRSA by PCR: NEGATIVE

## 2018-06-11 MED ORDER — JUVEN PO PACK
1.0000 | PACK | Freq: Two times a day (BID) | ORAL | Status: DC
  Administered 2018-06-13 – 2018-06-15 (×3): 1 via ORAL
  Filled 2018-06-11 (×13): qty 1

## 2018-06-11 MED ORDER — ASPIRIN EC 81 MG PO TBEC
81.0000 mg | DELAYED_RELEASE_TABLET | Freq: Every day | ORAL | Status: DC
  Administered 2018-06-11 – 2018-06-16 (×6): 81 mg via ORAL
  Filled 2018-06-11 (×6): qty 1

## 2018-06-11 MED ORDER — LIVING WELL WITH DIABETES BOOK
Freq: Once | Status: AC
  Administered 2018-06-11: 16:00:00
  Filled 2018-06-11: qty 1

## 2018-06-11 MED ORDER — GADOBUTROL 1 MMOL/ML IV SOLN
7.5000 mL | Freq: Once | INTRAVENOUS | Status: AC | PRN
  Administered 2018-06-11: 6 mL via INTRAVENOUS

## 2018-06-11 MED ORDER — CHLORHEXIDINE GLUCONATE 4 % EX LIQD: 60.0000 mL | Freq: Once | CUTANEOUS | Status: DC

## 2018-06-11 MED ORDER — NICOTINE 21 MG/24HR TD PT24
21.0000 mg | MEDICATED_PATCH | Freq: Every day | TRANSDERMAL | Status: DC
  Administered 2018-06-11 – 2018-06-16 (×6): 21 mg via TRANSDERMAL
  Filled 2018-06-11 (×6): qty 1

## 2018-06-11 MED ORDER — POVIDONE-IODINE 10 % EX SWAB: 2.0000 "application " | Freq: Once | CUTANEOUS | Status: DC

## 2018-06-11 MED ORDER — CEFAZOLIN SODIUM-DEXTROSE 2-4 GM/100ML-% IV SOLN
2.0000 g | INTRAVENOUS | Status: AC
  Administered 2018-06-12: 2 g via INTRAVENOUS
  Filled 2018-06-11: qty 100

## 2018-06-11 MED ORDER — SODIUM CHLORIDE 0.9 % IV SOLN
INTRAVENOUS | Status: DC | PRN
  Administered 2018-06-11 – 2018-06-12 (×2): 1000 mL via INTRAVENOUS

## 2018-06-11 NOTE — Progress Notes (Signed)
Darrick Meigs, MD was paged d/t the pt reporting questions regarding the next step of his care. [facility] reported that Ortho will follow up with the pt today. I notified the pt of the ortho consult per [place].

## 2018-06-11 NOTE — Progress Notes (Signed)
VASCULAR LAB PRELIMINARY  ARTERIAL  ABI completed:    RIGHT    LEFT    PRESSURE WAVEFORM  PRESSURE WAVEFORM  BRACHIAL 124 Triphasic BRACHIAL 123 Triphasic  DP 73 Monophasic DP 79 Monophasic  AT   AT    PT 73 Monophasic PT 90 Monophasic  PER   PER    GREAT TOE  Absent GREAT TOE  Absent    RIGHT LEFT  ABI 0.59 0.73    Bilateral ABIs are suggestive of moderate arterial insufficiency at rest. Unable to calculate TBIs bilaterally due to no apparent waveform. Preliminary results discussed with Dr. Darrick Meigs.  06/11/2018 11:57 AM Maudry Mayhew, MHA, RVT, RDCS, RDMS

## 2018-06-11 NOTE — Consult Note (Signed)
[facility] Nurse wound consult note Reason for Consult:Cellulitis to left plantar foot.  Unclear etiology, he may have stepped on glass.  No foreign body present at this time.  Dry scabbed lesion to right plantar foot at great toe.  Wound type: Infectious with possible neuropathy and decreased sensation due to bilateral nonintact lesions to plantar feet.  Pressure Injury POA: NA Measurement:Left foot:  4 cm x 3 cm erythema with 0.3 cm opening in center.  Patient states wound ruptured and large amount serosanguinous effluent was emitted.  Wound bed: unable to visualize.   Drainage (amount, consistency, odor) Initially large serosanguinous  Weeping now.  Periwound:maceration and erythema  Tender to touch he states he could not wear shoe Dressing procedure/placement/frequency:Cleanse left foot with soap and water. Apply Aquacel AG to open wound .cover with gauze and kerlix/tape.  Change daily.  Right foot open to air.  Will not follow at this time.  Please re-consult if needed.  Domenic Moras MSN, RN, FNP-BC CWON Wound, Ostomy, Continence Nurse Pager [phone]

## 2018-06-11 NOTE — Consult Note (Signed)
Reason for Consult: Questionable abscess left foot Referring Physician: Hospitalist  Rodolph Hagemann is an 57 y.o. male.  HPI: The patient is a 57 year old male who is had about a 10-day history of swelling in his left foot.  He is had some drainage off and on during that time.  He does not remember having previous problems with the foot.  He has not known to be a diabetic but was admitted with elevated blood sugars.  He states that he has been having some worsening sensation in his lower extremity and by his feeling that he has not been getting good enough blood supply to his foot over the last several months.  He is admitted to the medical service with an obvious draining wound for treatment and evaluation.  We are consulted to help manage this lower extremity wound.  History reviewed. No pertinent past medical history.  History reviewed. No pertinent surgical history.  Family History  Problem Relation Age of Onset  . Diabetes Mother   . Cancer Mother     Social History:  reports that he has been smoking. He has never used smokeless tobacco. He reports that he drank alcohol. His drug history is not on file.  Allergies: No Known Allergies  Medications: I have reviewed the patient's current medications.  Results for orders placed or performed during the hospital encounter of 06/10/18 (from the past 48 hour(s))  Comprehensive metabolic panel     Status: Abnormal   Collection Time: 06/10/18  7:08 PM  Result Value Ref Range   Sodium 137 135 - 145 mmol/L   Potassium 4.6 3.5 - 5.1 mmol/L   Chloride 96 (L) 98 - 111 mmol/L   CO2 26 22 - 32 mmol/L   Glucose, Bld 250 (H) 70 - 99 mg/dL   BUN 13 6 - 20 mg/dL   Creatinine, Ser 0.90 0.61 - 1.24 mg/dL   Calcium 10.0 8.9 - 10.3 mg/dL   Total Protein 8.7 (H) 6.5 - 8.1 g/dL   Albumin 3.7 3.5 - 5.0 g/dL   AST 17 15 - 41 U/L   ALT 14 0 - 44 U/L   Alkaline Phosphatase 111 38 - 126 U/L   Total Bilirubin 0.6 0.3 - 1.2 mg/dL   GFR calc non Af Amer  >60 >60 mL/min   GFR calc Af Amer >60 >60 mL/min    Comment: (NOTE) The eGFR has been calculated using the CKD EPI equation. This calculation has not been validated in all clinical situations. eGFR's persistently <60 mL/min signify possible Chronic Kidney Disease.    Anion gap 15 5 - 15    Comment: Performed at [facility], [place] [address], [place], [place] [address]  CBC with Differential     Status: Abnormal   Collection Time: 06/10/18  7:08 PM  Result Value Ref Range   WBC 12.9 (H) 4.0 - 10.5 K/uL   RBC 4.99 4.22 - 5.81 MIL/uL   Hemoglobin 15.9 13.0 - 17.0 g/dL   HCT 45.4 39.0 - 52.0 %   MCV 91.0 78.0 - 100.0 fL   MCH 31.9 26.0 - 34.0 pg   MCHC 35.0 30.0 - 36.0 g/dL   RDW 11.9 11.5 - 15.5 %   Platelets 382 150 - 400 K/uL   Neutrophils Relative % 71 %   Neutro Abs 9.3 (H) 1.7 - 7.7 K/uL   Lymphocytes Relative 19 %   Lymphs Abs 2.4 0.7 - 4.0 K/uL   Monocytes Relative 9 %   Monocytes  Absolute 1.1 (H) 0.1 - 1.0 K/uL   Eosinophils Relative 1 %   Eosinophils Absolute 0.1 0.0 - 0.7 K/uL   Basophils Relative 0 %   Basophils Absolute 0.1 0.0 - 0.1 K/uL    Comment: Performed at [facility], [place] [address], [place], [place] [address]  CG4 I-STAT (Lactic acid)     Status: Abnormal   Collection Time: 06/10/18  7:16 PM  Result Value Ref Range   Lactic Acid, Venous 2.56 (HH) 0.5 - 1.9 mmol/L   Comment NOTIFIED PHYSICIAN   CG4 I-STAT (Lactic acid)     Status: Abnormal   Collection Time: 06/10/18  8:46 PM  Result Value Ref Range   Lactic Acid, Venous 2.41 (HH) 0.5 - 1.9 mmol/L   Comment NOTIFIED PHYSICIAN   Blood culture (routine x 2)     Status: None (Preliminary result)   Collection Time: 06/10/18  9:10 PM  Result Value Ref Range   Specimen Description      BLOOD RIGHT FOREARM Performed at [facility], [place] [address], [place], [place] [address]    Special Requests      BOTTLES DRAWN AEROBIC AND ANAEROBIC Blood Culture adequate  volume Performed at [place] [address], [place], [place] [address]    Culture      NO GROWTH < 12 HOURS Performed at [place] [address] [place], [place] [address]    Report Status PENDING   Blood culture (routine x 2)     Status: None (Preliminary result)   Collection Time: 06/10/18  9:15 PM  Result Value Ref Range   Specimen Description      BLOOD LEFT ANTECUBITAL Performed at [place] [address], [place], [place] [address]    Special Requests      BOTTLES DRAWN AEROBIC AND ANAEROBIC Blood Culture adequate volume Performed at [place] [address] [place], [place] [address]    Culture      NO GROWTH < 12 HOURS Performed at [place] [address], [place], [place] [address]    Report Status PENDING   Urinalysis, Routine w reflex microscopic     Status: Abnormal   Collection Time: 06/11/18 12:00 AM  Result Value Ref Range   Color, Urine YELLOW YELLOW   APPearance CLEAR CLEAR   Specific Gravity, Urine 1.016 1.005 - 1.030   pH 6.0 5.0 - 8.0   Glucose, UA >=500 (A) NEGATIVE mg/dL   Hgb urine dipstick NEGATIVE NEGATIVE   Bilirubin Urine NEGATIVE NEGATIVE   Ketones, ur 20 (A) NEGATIVE mg/dL   Protein, ur NEGATIVE NEGATIVE mg/dL   Nitrite NEGATIVE NEGATIVE   Leukocytes, UA NEGATIVE NEGATIVE   RBC / HPF 0-5 0 - 5 RBC/hpf   WBC, UA 0-5 0 - 5 WBC/hpf   Bacteria, UA NONE SEEN NONE SEEN   Squamous Epithelial / LPF 0-5 0 - 5   Mucus PRESENT    Hyaline Casts, UA PRESENT     Comment: Performed at [facility], [place] [address], [place], [place] [address]  Sedimentation rate     Status: Abnormal   Collection Time: 06/11/18 12:13 AM  Result Value Ref Range   Sed Rate 78 (H) 0 - 16 mm/hr    Comment: Performed at [facility], [place] [address], [place], [place] [address]  C-reactive protein     Status: Abnormal   Collection Time: 06/11/18  12:13 AM  Result Value Ref Range   CRP 13.9 (H) <1.0 mg/dL    Comment: Performed at [facility], [place] [address], [place], [place] [address]  Prealbumin     Status: Abnormal   Collection Time: 06/11/18 12:13 AM  Result Value Ref Range   Prealbumin 7.6 (L) 18 - 38 mg/dL    Comment: Performed at [place], [place] [address] [place], [place] [address]  Lactic acid, plasma     Status: None   Collection Time: 06/11/18 12:13 AM  Result Value Ref Range   Lactic Acid, Venous 1.8 0.5 - 1.9 mmol/L    Comment: Performed at [facility], [place] [address], [place], [place] [address]  Procalcitonin     Status: None   Collection Time: 06/11/18 12:13 AM  Result Value Ref Range   Procalcitonin <0.10 ng/mL    Comment:        Interpretation: PCT (Procalcitonin) <= 0.5 ng/mL: Systemic infection (sepsis) is not likely. Local bacterial infection is possible. (NOTE)       Sepsis PCT Algorithm           Lower Respiratory Tract                                      Infection PCT Algorithm    ----------------------------     ----------------------------         PCT < 0.25 ng/mL                PCT < 0.10 ng/mL         Strongly encourage             Strongly discourage   discontinuation of antibiotics    initiation of antibiotics    ----------------------------     -----------------------------       PCT 0.25 - 0.50 ng/mL            PCT 0.10 - 0.25 ng/mL               OR       >80% decrease in PCT            Discourage initiation of                                            antibiotics      Encourage discontinuation           of antibiotics    ----------------------------     -----------------------------         PCT >= 0.50 ng/mL              PCT 0.26 - 0.50 ng/mL               AND        <80% decrease in PCT             Encourage initiation of                                             antibiotics       Encourage continuation           of  antibiotics    ----------------------------     -----------------------------        PCT >= 0.50 ng/mL                  PCT > 0.50 ng/mL               AND         increase in PCT                  Strongly encourage                                      initiation of antibiotics    Strongly encourage escalation           of antibiotics                                     -----------------------------                                           PCT <= 0.25 ng/mL                                                 OR                                        > 80% decrease in PCT                                     Discontinue / Do not initiate                                             antibiotics Performed at [place] [address], [place], [place] [address]   Protime-INR     Status: None   Collection Time: 06/11/18 12:13 AM  Result Value Ref Range   Prothrombin Time 13.7 11.4 - 15.2 seconds   INR 1.06     Comment: Performed at [facility], [place] [address] [place], [place] [address]  APTT     Status: None   Collection Time: 06/11/18 12:13 AM  Result Value Ref Range   aPTT 32 24 - 36 seconds    Comment: Performed at [facility], [place] [address] [place], [place] [address]  Glucose, capillary     Status: Abnormal   Collection Time: 06/11/18 12:26 AM  Result Value Ref Range   Glucose-Capillary 178 (H) 70 - 99 mg/dL  MRSA PCR Screening     Status: None   Collection Time: 06/11/18 12:30 AM  Result Value Ref Range   MRSA by PCR NEGATIVE NEGATIVE    Comment:        The GeneXpert MRSA Assay (FDA approved for NASAL specimens only), is one component  of a comprehensive MRSA colonization surveillance program. It is not intended to diagnose MRSA infection nor to guide or monitor treatment for MRSA infections. Performed at [facility], [place] [address] [place], [place] [address]   Glucose, capillary      Status: Abnormal   Collection Time: 06/11/18  4:10 AM  Result Value Ref Range   Glucose-Capillary 145 (H) 70 - 99 mg/dL  Lipid panel     Status: Abnormal   Collection Time: 06/11/18  4:42 AM  Result Value Ref Range   Cholesterol 148 0 - 200 mg/dL   Triglycerides 98 <150 mg/dL   HDL 28 (L) >40 mg/dL   Total CHOL/HDL Ratio 5.3 RATIO   VLDL 20 0 - 40 mg/dL   LDL Cholesterol 100 (H) 0 - 99 mg/dL    Comment:        Total Cholesterol/HDL:CHD Risk Coronary Heart Disease Risk Table                     Men   Women  1/2 Average Risk   3.4   3.3  Average Risk       5.0   4.4  2 X Average Risk   9.6   7.1  3 X Average Risk  23.4   11.0        Use the calculated Patient Ratio above and the CHD Risk Table to determine the patient's CHD Risk.        ATP III CLASSIFICATION (LDL):  <100     mg/dL   Optimal  100-129  mg/dL   Near or Above                    Optimal  130-159  mg/dL   Borderline  160-189  mg/dL   High  >190     mg/dL   Very High Performed at [place] [address], [place], [place] [address]   Lactic acid, plasma     Status: None   Collection Time: 06/11/18  4:42 AM  Result Value Ref Range   Lactic Acid, Venous 1.7 0.5 - 1.9 mmol/L    Comment: Performed at [facility], [place] [address], [place], [place] [address]  Magnesium     Status: None   Collection Time: 06/11/18  4:42 AM  Result Value Ref Range   Magnesium 2.1 1.7 - 2.4 mg/dL    Comment: Performed at [facility], [place] [address], [place], [place] [address]  Phosphorus     Status: None   Collection Time: 06/11/18  4:42 AM  Result Value Ref Range   Phosphorus 3.6 2.5 - 4.6 mg/dL    Comment: Performed at [facility], [place] [address], [place], [place] [address]  TSH     Status: None   Collection Time: 06/11/18  4:42 AM  Result Value Ref Range   TSH 0.590 0.350 - 4.500 uIU/mL    Comment: Performed by a 3rd Generation assay with a  functional sensitivity of <=0.01 uIU/mL. Performed at [facility], [place] [address], [place], [place] [address]   Comprehensive metabolic panel     Status: Abnormal   Collection Time: 06/11/18  4:42 AM  Result Value Ref Range   Sodium 142 135 - 145 mmol/L   Potassium 4.0 3.5 - 5.1 mmol/L   Chloride 103 98 - 111 mmol/L   CO2 28 22 - 32 mmol/L   Glucose, Bld  155 (H) 70 - 99 mg/dL   BUN 9 6 - 20 mg/dL   Creatinine, Ser 0.77 0.61 - 1.24 mg/dL   Calcium 9.0 8.9 - 10.3 mg/dL   Total Protein 7.0 6.5 - 8.1 g/dL   Albumin 3.0 (L) 3.5 - 5.0 g/dL   AST 13 (L) 15 - 41 U/L   ALT 12 0 - 44 U/L   Alkaline Phosphatase 87 38 - 126 U/L   Total Bilirubin 0.9 0.3 - 1.2 mg/dL   GFR calc non Af Amer >60 >60 mL/min   GFR calc Af Amer >60 >60 mL/min    Comment: (NOTE) The eGFR has been calculated using the CKD EPI equation. This calculation has not been validated in all clinical situations. eGFR's persistently <60 mL/min signify possible Chronic Kidney Disease.    Anion gap 11 5 - 15    Comment: Performed at [facility], [place] [address], [place], [place] [address]  CBC     Status: Abnormal   Collection Time: 06/11/18  4:42 AM  Result Value Ref Range   WBC 11.1 (H) 4.0 - 10.5 K/uL   RBC 4.41 4.22 - 5.81 MIL/uL   Hemoglobin 13.7 13.0 - 17.0 g/dL   HCT 40.3 39.0 - 52.0 %   MCV 91.4 78.0 - 100.0 fL   MCH 31.1 26.0 - 34.0 pg   MCHC 34.0 30.0 - 36.0 g/dL   RDW 11.9 11.5 - 15.5 %   Platelets 365 150 - 400 K/uL    Comment: Performed at [facility], [place] [address] [place], [place] [address]  Glucose, capillary     Status: Abnormal   Collection Time: 06/11/18  7:30 AM  Result Value Ref Range   Glucose-Capillary 134 (H) 70 - 99 mg/dL  Glucose, capillary     Status: Abnormal   Collection Time: 06/11/18 12:39 PM  Result Value Ref Range   Glucose-Capillary 257 (H) 70 - 99 mg/dL    Dg Chest 2 View  Result Date: 06/11/2018 CLINICAL DATA:   Abnormal lung sounds. EXAM: CHEST - 2 VIEW COMPARISON:  None. FINDINGS: The lungs are clear without focal pneumonia, edema, pneumothorax or pleural effusion. The cardiopericardial silhouette is within normal limits for size. The visualized bony structures of the thorax are intact. IMPRESSION: No active cardiopulmonary disease. Electronically Signed   By: Misty Stanley M.D.   On: 06/11/2018 08:42   Dg Foot Complete Left  Result Date: 06/10/2018 CLINICAL DATA:  Plantar foot wound, swelling EXAM: LEFT FOOT - COMPLETE 3+ VIEW COMPARISON:  None. FINDINGS: Fracture involving the proximal shaft of the 5th proximal phalanx. Mild degenerative changes involving the 1st MTP joint. Visualized soft tissues are grossly unremarkable in this patient with known plantar soft tissue wound. No radiopaque foreign body is seen. IMPRESSION: Fracture involving the proximal shaft of the 5th proximal phalanx. Electronically Signed   By: Julian Hy M.D.   On: 06/10/2018 19:20    ROS  ROS: I have reviewed the patient's review of systems thoroughly and there are no positive responses as relates to the HPI. Blood pressure 122/61, pulse 68, temperature 98.6 F (37 C), temperature source Oral, resp. rate 18, height _0  (1.803 m), weight 69.1 kg, SpO2 97 %. Physical Exam Well-developed well-nourished patient in no acute distress. Alert and oriented x3 HEENT:within normal limits Cardiac: Regular rate and rhythm Pulmonary: Lungs clear to auscultation Abdomen: Soft and nontender.  Normal active bowel sounds  Musculoskeletal: Left foot: Obvious erythema over the medial  aspect of the leg with a single punctate draining area centrally on the bottom of the foot.  Tenderness to palpation surrounding this erythematous wound.  Tenderness to palpation around the lateral aspect of the little toe.  Full range of motion of the great toe.  Decreased sensation of the toes globally compared to the hand.  Diminished pulses in the  foot. Assessment/Plan: 57 year old male likely diabetic of unknown severity with a 10-day history of redness and swelling in the left foot now with drainage in the central aspect on the plantar surface.  Radiographic evidence of proximal phalanx fracture left little toe without him knowing that he has a fracture.//I had a long talk with the patient and his hospitalist today.  Let a ways we could go here but I think we need an MRI to sort out with the extent of this infection is.  Given his vascular results would probably need them to weigh in as to whether he needs arteriogram prior to any sort of surgical intervention.  It is possible with an abscess that they would want [place] to drain that and get it under better control before they gave any consideration to arteriogram.  We will get their advice on that and also await the results of his MRI.  I will hold his Lovenox in the morning and probably plan on doing a little drainage in the operating room tomorrow if that is appropriate.  If there is different or better plan then certainly we could wait over the weekend while that is evolving.  I can be reached at any time to discuss the patient at the number below.  Andreanna Mikolajczak Maudie Mercury 06/11/2018, 1:38 PM  Cell: [phone]

## 2018-06-11 NOTE — Progress Notes (Signed)
Inpatient Diabetes Program Recommendations  AACE/ADA: New Consensus Statement on Inpatient Glycemic Control (2015)  Target Ranges:  Prepandial:   less than 140 mg/dL      Peak postprandial:   less than 180 mg/dL (1-2 hours)      Critically ill patients:  140 - 180 mg/dL   Lab Results  Component Value Date   GLUCAP 257 (H) 06/11/2018    Review of Glycemic Control  Diabetes history: New-onset Outpatient Diabetes medications: None Current orders for Inpatient glycemic control: Novolog 0-9 units Q4H  HgbA1C pending.  Used interpreter to speak with pt about new-onset DM. Will need PCP to manage his diabetes. Has not seen MD in over 20 years.   Inpatient Diabetes Program Recommendations:     Add Lantus 10 units QHS Add Novolog 3 units tidwc for meal coverage insulin   Discussed new diagnosis, how diet, exercise and stress affect blood sugars. Discussed what a HgbA1C is, glucose monitoring at home, and eating healthy diet, limiting simple CHOs.  Pt exercises regularly and eats diet of rice, pasta, veggies and weight is usually stable, although he's lost weight in the last 2 weeks, likely d/t diabetes. Will continue with education. Asked RN to allow pt to prick finger and discuss insulin administration.  Will f/u in am.  Thank you. Lorenda Peck, RD, LDN, CDE Inpatient Diabetes Coordinator [phone]

## 2018-06-11 NOTE — Progress Notes (Signed)
Pt has refused pain medications twice this morning. Pt reassured me that he is ok. Pt refused to elevate lower extremities at this time. I educated the pt regarding swelling and how elevating his leg could help reduce/prevent further swelling and possibly help reduce pain. Pt nicely refused at this time.

## 2018-06-11 NOTE — Progress Notes (Signed)
CSW consulted for access to meds. Alerted RNCM. No more CSW needs identified.  Signing off.   Pricilla Holm, MSW, [place] Social Work [phone]

## 2018-06-11 NOTE — Progress Notes (Signed)
[facility]  PROGRESS NOTE  Hakiem Malizia ALP:[id number] DOB: 1960/10/22 DOA: 06/10/2018 PCP: Patient, No Pcp Per   Brief HPI:   57 year old male with no significant medical problems came to hospital with puncture wound to left foot followed by swelling.  Patient has not seen a physician for past 21 years.  Patient says that one week ago he stepped on a glass while visiting his son.  After that he started swelling.    Subjective   Patient seen and examined, denies pain.  Continues to have discharge from the left foot.   Assessment/Plan:     1. Left foot abscess-patient is new onset diabetic, continue empiric antibiotics Rocephin and Flagyl.  Will consult orthopedics for further recommendations.  Patient might need an MRI to rule out osteomyelitis.  2. Peripheral arterial disease-ABIs done this morning showed moderate peripheral arterial disease, ABI right 0.59, ABI left 0.73.  Will consult orthopedic surgery for further recommendations.  3. Diabetes mellitus-new onset, hemoglobin A1c is pending.  Continue sliding scale insulin with NovoLog.     CBG: Recent Labs  Lab 06/11/18 0026 06/11/18 0410 06/11/18 0730 06/11/18 1239  GLUCAP 178* 145* 134* 257*    CBC: Recent Labs  Lab 06/10/18 1908 06/11/18 0442  WBC 12.9* 11.1*  NEUTROABS 9.3*  --   HGB 15.9 13.7  HCT 45.4 40.3  MCV 91.0 91.4  PLT 382 353    Basic Metabolic Panel: Recent Labs  Lab 06/10/18 1908 06/11/18 0442  NA 137 142  K 4.6 4.0  CL 96* 103  CO2 26 28  GLUCOSE 250* 155*  BUN 13 9  CREATININE 0.90 0.77  CALCIUM 10.0 9.0  MG  --  2.1  PHOS  --  3.6     DVT prophylaxis: Lovenox  Code Status: Full code  Family Communication: No family at bedside  Disposition Plan: likely home when medically ready for discharge   Consultants:  None  Procedures:  None   Antibiotics:   Anti-infectives (From admission, onward)   Start     Dose/Rate Route Frequency Ordered Stop   06/11/18 0500  metroNIDAZOLE (FLAGYL) IVPB 500 mg     500 mg 100 mL/hr over 60 Minutes Intravenous Every 8 hours 06/10/18 2352     06/11/18 0400  cefTRIAXone (ROCEPHIN) 2 g in sodium chloride 0.9 % 100 mL IVPB     2 g 200 mL/hr over 30 Minutes Intravenous Daily 06/10/18 2352     06/10/18 2115  piperacillin-tazobactam (ZOSYN) IVPB 3.375 g     3.375 g 100 mL/hr over 30 Minutes Intravenous  Once 06/10/18 2109 06/10/18 2218   06/10/18 2115  vancomycin (VANCOCIN) 1,500 mg in sodium chloride 0.9 % 500 mL IVPB     1,500 mg 250 mL/hr over 120 Minutes Intravenous  Once 06/10/18 2112 06/11/18 0038       Objective   Vitals:   06/11/18 0011 06/11/18 0600 06/11/18 0918 06/11/18 1420  BP:  126/63 122/61 129/68  Pulse:  66 68 62  Resp:  16 18 18   Temp:  98.3 F (36.8 C) 98.6 F (37 C) 98.2 F (36.8 C)  TempSrc:  Oral Oral Oral  SpO2:  98% 97% 98%  Weight: 69.1 kg     Height: 5\' 11"  (1.803 m)       Intake/Output Summary (Last 24 hours) at 06/11/2018 1519 Last data filed at 06/11/2018 1435 Gross per 24 hour  Intake 3873.56 ml  Output 580 ml  Net 3293.56 ml   [employer]  06/11/18 0011  Weight: 69.1 kg     Physical Examination:    General: Appears in no acute distress  Cardiovascular: S1-S2, regular  Respiratory: Clear to auscultation bilaterally, no wheezing or crackles auscultated  Abdomen: Soft, nontender, no organomegaly  Extremities: Left foot noted to have significant purulent discharge from the plantar surface  Neurologic: Alert, oriented x3, no focal deficit noted     Data Reviewed: I have personally reviewed following labs and imaging studies   Recent Results (from the past 240 hour(s))  Blood culture (routine x 2)     Status: None (Preliminary result)   Collection Time: 06/10/18  9:10 PM  Result Value Ref Range Status   Specimen Description   Final    BLOOD RIGHT FOREARM Performed at [facility], [address] [place], [place] [address]     Special Requests   Final    BOTTLES DRAWN AEROBIC AND ANAEROBIC Blood Culture adequate volume Performed at [facility] [address] [place], [place] [address]    Culture   Final    NO GROWTH < 24 HOURS Performed at [facility] [address], [place], [place] [address]    Report Status PENDING  Incomplete  Blood culture (routine x 2)     Status: None (Preliminary result)   Collection Time: 06/10/18  9:15 PM  Result Value Ref Range Status   Specimen Description   Final    BLOOD LEFT ANTECUBITAL Performed at [facility] [address], [place], [place] [address]    Special Requests   Final    BOTTLES DRAWN AEROBIC AND ANAEROBIC Blood Culture adequate volume Performed at [facility] [address] [place], [place] [address]    Culture   Final    NO GROWTH < 24 HOURS Performed at [facility] [address] [place], [place] [address]    Report Status PENDING  Incomplete  MRSA PCR Screening     Status: None   Collection Time: 06/11/18 12:30 AM  Result Value Ref Range Status   MRSA by PCR NEGATIVE NEGATIVE Final    Comment:        The GeneXpert MRSA Assay (FDA approved for NASAL specimens only), is one component of a comprehensive MRSA colonization surveillance program. It is not intended to diagnose MRSA infection nor to guide or monitor treatment for MRSA infections. Performed at [facility], [place] [address] [place], [place] [address]      Liver Function Tests: Recent Labs  Lab 06/10/18 1908 06/11/18 0442  AST 17 13*  ALT 14 12  ALKPHOS 111 87  BILITOT 0.6 0.9  PROT 8.7* 7.0  ALBUMIN 3.7 3.0*   No results for input(s): LIPASE, AMYLASE in the last 168 hours. No results for input(s): AMMONIA in the last 168 hours.  Cardiac Enzymes: No results for input(s): CKTOTAL, CKMB, CKMBINDEX, TROPONINI in the last 168 hours. BNP (last 3 results) No results for input(s):  BNP in the last 8760 hours.  ProBNP (last 3 results) No results for input(s): PROBNP in the last 8760 hours.    Studies: Dg Chest 2 View  Result Date: 06/11/2018 CLINICAL DATA:  Abnormal lung sounds. EXAM: CHEST - 2 VIEW COMPARISON:  None. FINDINGS: The lungs are clear without focal pneumonia, edema, pneumothorax or pleural effusion. The cardiopericardial silhouette is within normal limits for size. The visualized bony structures of the thorax are intact. IMPRESSION: No active cardiopulmonary disease. Electronically  Signed   By: Misty Stanley M.D.   On: 06/11/2018 08:42   Dg Foot Complete Left  Result Date: 06/10/2018 CLINICAL DATA:  Plantar foot wound, swelling EXAM: LEFT FOOT - COMPLETE 3+ VIEW COMPARISON:  None. FINDINGS: Fracture involving the proximal shaft of the 5th proximal phalanx. Mild degenerative changes involving the 1st MTP joint. Visualized soft tissues are grossly unremarkable in this patient with known plantar soft tissue wound. No radiopaque foreign body is seen. IMPRESSION: Fracture involving the proximal shaft of the 5th proximal phalanx. Electronically Signed   By: Julian Hy M.D.   On: 06/10/2018 19:20    Scheduled Meds: . enoxaparin (LOVENOX) injection  40 mg Subcutaneous Daily  . insulin aspart  0-9 Units Subcutaneous Q4H  . nicotine  21 mg Transdermal Daily  . nutrition supplement (JUVEN)  1 packet Oral BID BM      Time spent: 25 min  [facility] Pager [phone]. If 7PM-7AM, please contact night-coverage at [email or web address], Office  [phone]  password [email or web address]  06/11/2018, 3:19 PM  LOS: 1 day

## 2018-06-11 NOTE — Progress Notes (Signed)
Initial Nutrition Assessment  DOCUMENTATION CODES:   Non-severe (moderate) malnutrition in context of acute illness/injury  INTERVENTION:   Provide Juven Fruit Punch BID, each serving provides 95kcal and 2.5g of protein (amino acids glutamine and arginine)  NUTRITION DIAGNOSIS:   Moderate Malnutrition related to acute illness(pain related to puncture of left foot) as evidenced by percent weight loss, energy intake < 75% for > 7 days.  GOAL:   Patient will meet greater than or equal to 90% of their needs  MONITOR:   PO intake, Supplement acceptance, Labs, Weight trends, I & O's, Skin  REASON FOR ASSESSMENT:   Consult Wound healing  ASSESSMENT:   57 y.o. male Presented with puncture wound followed by swelling.    Patient in room with nursing student at bedside. Pt reporting to student that he his foot continues to hurt.  Pt reports he ate eggs, bacon, bagel with cream cheese and coffee for breakfast. Pt states he is fine to eat 1 meal a day. At home he states he was in a lot of pain d/t his foot and he wasn't eating. Pt has been refusing medications and some care from staff. Pt very preoccupied with his foot. Not interested in suggestions from RD and does not seem to believe nutrition will have an impact on the healing of his foot. Believes he will need antibiotics only.  Pt admitted with elevated CBG levels. There is a consult for diabetes coordinator related to likely new diabetes diagnosis.  Per patient, UBW is 165 lb. Pt has lost 13 lb since a week ago, that would be 9% wt loss x 1 week, significant for time frame.   NUTRITION - FOCUSED PHYSICAL EXAM:    Most Recent Value  Orbital Region  No depletion  Upper Arm Region  No depletion  Thoracic and Lumbar Region  Unable to assess  Buccal Region  No depletion  Temple Region  No depletion  Clavicle Bone Region  No depletion  Clavicle and Acromion Bone Region  No depletion  Scapular Bone Region  No depletion  Dorsal Hand   No depletion  Patellar Region  No depletion  Anterior Thigh Region  No depletion  Posterior Calf Region  Mild depletion  Edema (RD Assessment)  None       Diet Order:   Diet Order            Diet Carb Modified Fluid consistency: Thin; Room service appropriate? Yes  Diet effective now              EDUCATION NEEDS:   Education needs have been addressed(Pt not receptive to education)  Skin:  Skin Assessment: Skin Integrity Issues: Skin Integrity Issues:: Other (Comment) Other: puncture wound on left foot  Last BM:  10/2  Height:   Ht Readings from Last 1 Encounters:  06/11/18 5\' 11"  (1.803 m)    Weight:   Wt Readings from Last 1 Encounters:  06/11/18 69.1 kg    Ideal Body Weight:  78.2 kg  BMI:  Body mass index is 21.25 kg/m.  Estimated Nutritional Needs:   Kcal:  6203-5597  Protein:  80-90g  Fluid:  2L/day   Clayton Bibles, MS, RD, LDN [facility] Pager: [phone] After Hours Pager: [phone]

## 2018-06-11 NOTE — Consult Note (Signed)
Hospital Consult    Reason for Consult:  Foot abscess Referring Physician:  Dr. Darrick Meigs MRN #:  [id number]  History of Present Illness: This is a 57 y.o. male previously from his back to stand.  He now presents with left foot swelling and drainage and pain.  He has been unable to walk for several days because of this.  He says he feels like he is not getting enough blood supply but he denies frank claudication or rest pain.  He has remained active prior to this.  He is a chronic longtime smoker.  Denies any history of diabetes hypertension or hypercholesterolemia.  He does not take aspirin or other blood thinners.  PMH denies any pertinent history  Surgical history no previous surgery  Social history he is a chronic long-term smoker  Family history: No pertinent family history   No Known Allergies  Prior to Admission medications   Medication Sig Start Date End Date Taking? Authorizing Provider  acetaminophen (TYLENOL) 500 MG tablet Take 500 mg by mouth every 6 (six) hours as needed.   Yes [provider]    Social History   Socioeconomic History  . Marital status: Married    Spouse name: Not on file  . Number of children: Not on file  . Years of education: Not on file  . Highest education level: Not on file  Occupational History  . Not on file  Social Needs  . Financial resource strain: Not on file  . Food insecurity:    Worry: Not on file    Inability: Not on file  . Transportation needs:    Medical: Not on file    Non-medical: Not on file  Tobacco Use  . Smoking status: Current Every Day Smoker  . Smokeless tobacco: Never Used  Substance and Sexual Activity  . Alcohol use: Not Currently  . Drug use: Not on file  . Sexual activity: Not on file  Lifestyle  . Physical activity:    Days per week: Not on file    Minutes per session: Not on file  . Stress: Not on file  Relationships  . Social connections:    Talks on phone: Not on file    Gets together:  Not on file    Attends religious service: Not on file    Active member of club or organization: Not on file    Attends meetings of clubs or organizations: Not on file    Relationship status: Not on file  . Intimate partner violence:    Fear of current or ex partner: Not on file    Emotionally abused: Not on file    Physically abused: Not on file    Forced sexual activity: Not on file  Other Topics Concern  . Not on file  Social History Narrative  . Not on file     Family History  Problem Relation Age of Onset  . Diabetes Mother   . Cancer Mother     ROS: ascular: []  chest pain/pressure []  palpitations []  SOB lying flat []  DOE [x]  pain in legs while walking []  pain in legs at rest []  pain in legs at night []  non-healing ulcers []  hx of DVT []  swelling in legs  Pulmonary: []  productive cough []  asthma/wheezing []  home O2  Neurologic: []  weakness in []  arms []  legs []  numbness in []  arms []  legs []  hx of CVA []  mini stroke [] difficulty speaking or slurred speech []  temporary loss of vision in one eye []   dizziness  Hematologic: []  hx of cancer []  bleeding problems []  problems with blood clotting easily  Endocrine:   [x]  diabetes []  thyroid disease  GI []  vomiting blood []  blood in stool  GU: []  CKD/renal failure []  HD--[]  M/W/F or []  T/T/S []  burning with urination []  blood in urine  Psychiatric: []  anxiety []  depression  Musculoskeletal: []  arthritis []  joint pain  Integumentary: []  rashes [x]  ulcers  Constitutional: [x]  fever []  chills   Physical Examination  Vitals:   06/11/18 0600 06/11/18 0918  BP: 126/63 122/61  Pulse: 66 68  Resp: 16 18  Temp: 98.3 F (36.8 C) 98.6 F (37 C)  SpO2: 98% 97%   Body mass index is 21.25 kg/m.  General:  WDWN in NAD HENT: WNL, normocephalic Pulmonary: normal non-labored breathing Cardiac: Palpable femoral pulses bilaterally 1+ pop deal pulses bilaterally No pedal pulses  palpable Abdomen: soft, NT/ND, no masses Extremities: Left foot is cellulitic with an area of fluctuance Musculoskeletal: no muscle wasting or atrophy  Neurologic: A&O X 3; Appropriate Affect ; SENSATION: normal; MOTOR FUNCTION:  moving all extremities equally. Speech is fluent/normal   CBC    Component Value Date/Time   WBC 11.1 (H) 06/11/2018 0442   RBC 4.41 06/11/2018 0442   HGB 13.7 06/11/2018 0442   HCT 40.3 06/11/2018 0442   PLT 365 06/11/2018 0442   MCV 91.4 06/11/2018 0442   MCH 31.1 06/11/2018 0442   MCHC 34.0 06/11/2018 0442   RDW 11.9 06/11/2018 0442   LYMPHSABS 2.4 06/10/2018 1908   MONOABS 1.1 (H) 06/10/2018 1908   EOSABS 0.1 06/10/2018 1908   BASOSABS 0.1 06/10/2018 1908    BMET    Component Value Date/Time   NA 142 06/11/2018 0442   K 4.0 06/11/2018 0442   CL 103 06/11/2018 0442   CO2 28 06/11/2018 0442   GLUCOSE 155 (H) 06/11/2018 0442   BUN 9 06/11/2018 0442   CREATININE 0.77 06/11/2018 0442   CALCIUM 9.0 06/11/2018 0442   GFRNONAA >60 06/11/2018 0442   GFRAA >60 06/11/2018 0442    COAGS: Lab Results  Component Value Date   INR 1.06 06/11/2018     Non-Invasive Vascular Imaging:     Left foot xray FINDINGS: Fracture involving the proximal shaft of the 5th proximal phalanx.  Mild degenerative changes involving the 1st MTP joint.  Visualized soft tissues are grossly unremarkable in this patient with known plantar soft tissue wound.  No radiopaque foreign body is seen.  IMPRESSION: Fracture involving the proximal shaft of the 5th proximal phalanx.  ASSESSMENT/PLAN: This is a 57 y.o. male here with left foot abscess as well as a fracture of his left small toe.  Will need angiography early next week and can be transferred to [facility] this weekend.  Will also need to be placed on antiplatelet agents.  Okay for abscess drainage at this time.  I discussed this with Drs. Lama and Graves. Also discussed with patient that this is a limb  threatening situation.   Neesha Langton C. Donzetta Matters, MD [facility] Office: [phone] Pager: [phone]

## 2018-06-12 ENCOUNTER — Inpatient Hospital Stay (HOSPITAL_COMMUNITY): Payer: Self-pay | Admitting: Certified Registered"

## 2018-06-12 ENCOUNTER — Encounter (HOSPITAL_COMMUNITY)
Admission: EM | Disposition: A | Discharge status: Home / Self Care | Payer: Self-pay | Source: Home / Self Care | Attending: Family Medicine

## 2018-06-12 ENCOUNTER — Encounter (HOSPITAL_COMMUNITY): Payer: Self-pay

## 2018-06-12 DIAGNOSIS — I739 Peripheral vascular disease, unspecified: Secondary | ICD-10-CM

## 2018-06-12 HISTORY — PX: I&D EXTREMITY: SHX5045

## 2018-06-12 LAB — CBC
HCT: 40.3 % (ref 39.0–52.0)
Hemoglobin: 13.8 g/dL (ref 13.0–17.0)
MCH: 31.2 pg (ref 26.0–34.0)
MCHC: 34.2 g/dL (ref 30.0–36.0)
MCV: 91.2 fL (ref 78.0–100.0)
Platelets: 398 10*3/uL (ref 150–400)
RBC: 4.42 MIL/uL (ref 4.22–5.81)
RDW: 12 % (ref 11.5–15.5)
WBC: 8.9 10*3/uL (ref 4.0–10.5)

## 2018-06-12 LAB — COMPREHENSIVE METABOLIC PANEL
ALT: 12 U/L (ref 0–44)
AST: 15 U/L (ref 15–41)
Albumin: 2.9 g/dL — ABNORMAL LOW (ref 3.5–5.0)
Alkaline Phosphatase: 91 U/L (ref 38–126)
Anion gap: 13 (ref 5–15)
BUN: 11 mg/dL (ref 6–20)
CO2: 26 mmol/L (ref 22–32)
Calcium: 9 mg/dL (ref 8.9–10.3)
Chloride: 102 mmol/L (ref 98–111)
Creatinine, Ser: 0.83 mg/dL (ref 0.61–1.24)
GFR calc Af Amer: >60 mL/min (ref >60)
GFR calc non Af Amer: >60 mL/min (ref >60)
Glucose, Bld: 149 mg/dL — ABNORMAL HIGH (ref 70–99)
Potassium: 5 mmol/L (ref 3.5–5.1)
Sodium: 141 mmol/L (ref 135–145)
Total Bilirubin: 0.8 mg/dL (ref 0.3–1.2)
Total Protein: 7 g/dL (ref 6.5–8.1)

## 2018-06-12 LAB — GLUCOSE, CAPILLARY
Glucose-Capillary: 130 mg/dL — ABNORMAL HIGH (ref 70–99)
Glucose-Capillary: 133 mg/dL — ABNORMAL HIGH (ref 70–99)
Glucose-Capillary: 119 mg/dL — ABNORMAL HIGH (ref 70–99)
Glucose-Capillary: 128 mg/dL — ABNORMAL HIGH (ref 70–99)
Glucose-Capillary: 168 mg/dL — ABNORMAL HIGH (ref 70–99)
Glucose-Capillary: 155 mg/dL — ABNORMAL HIGH (ref 70–99)

## 2018-06-12 LAB — HEMOGLOBIN A1C
Hgb A1c MFr Bld: 10.4 % — ABNORMAL HIGH (ref 4.8–5.6)
Mean Plasma Glucose: 252 mg/dL

## 2018-06-12 SURGERY — IRRIGATION AND DEBRIDEMENT EXTREMITY
Anesthesia: General | Laterality: Left

## 2018-06-12 MED ORDER — MIDAZOLAM HCL 2 MG/2ML IJ SOLN
INTRAMUSCULAR | Status: AC
  Filled 2018-06-12: qty 2

## 2018-06-12 MED ORDER — INSULIN GLARGINE 100 UNIT/ML ~~LOC~~ SOLN
10.0000 [IU] | Freq: Every day | SUBCUTANEOUS | Status: DC
  Administered 2018-06-12 – 2018-06-15 (×4): 10 [IU] via SUBCUTANEOUS
  Filled 2018-06-12 (×4): qty 0.1

## 2018-06-12 MED ORDER — SODIUM CHLORIDE 0.9 % IR SOLN
Status: DC | PRN
  Administered 2018-06-12: 1000 mL

## 2018-06-12 MED ORDER — CHLORHEXIDINE GLUCONATE 4 % EX LIQD: 60.0000 mL | Freq: Once | CUTANEOUS | Status: DC

## 2018-06-12 MED ORDER — MIDAZOLAM HCL 5 MG/5ML IJ SOLN
INTRAMUSCULAR | Status: DC | PRN
  Administered 2018-06-12: 2 mg via INTRAVENOUS

## 2018-06-12 MED ORDER — PROPOFOL 10 MG/ML IV BOLUS
INTRAVENOUS | Status: DC | PRN
  Administered 2018-06-12: 180 mg via INTRAVENOUS

## 2018-06-12 MED ORDER — ENOXAPARIN SODIUM 40 MG/0.4ML ~~LOC~~ SOLN
40.0000 mg | Freq: Every day | SUBCUTANEOUS | Status: DC
  Administered 2018-06-13 – 2018-06-16 (×3): 40 mg via SUBCUTANEOUS
  Filled 2018-06-12 (×3): qty 0.4

## 2018-06-12 MED ORDER — PROPOFOL 10 MG/ML IV BOLUS
INTRAVENOUS | Status: AC
  Filled 2018-06-12: qty 20

## 2018-06-12 MED ORDER — BUPIVACAINE HCL (PF) 0.5 % IJ SOLN
INTRAMUSCULAR | Status: AC
  Filled 2018-06-12: qty 30

## 2018-06-12 MED ORDER — DEXAMETHASONE SODIUM PHOSPHATE 10 MG/ML IJ SOLN
INTRAMUSCULAR | Status: AC
  Filled 2018-06-12: qty 1

## 2018-06-12 MED ORDER — LACTATED RINGERS IV SOLN
INTRAVENOUS | Status: DC | PRN
  Administered 2018-06-12: 13:00:00 via INTRAVENOUS

## 2018-06-12 MED ORDER — LIDOCAINE 2% (20 MG/ML) 5 ML SYRINGE
INTRAMUSCULAR | Status: AC
  Filled 2018-06-12: qty 5

## 2018-06-12 MED ORDER — EPHEDRINE 5 MG/ML INJ
INTRAVENOUS | Status: AC
  Filled 2018-06-12: qty 10

## 2018-06-12 MED ORDER — FENTANYL CITRATE (PF) 100 MCG/2ML IJ SOLN: 25.0000 ug | INTRAMUSCULAR | Status: DC | PRN

## 2018-06-12 MED ORDER — OXYCODONE HCL 5 MG/5ML PO SOLN
5.0000 mg | Freq: Once | ORAL | Status: DC | PRN
  Filled 2018-06-12: qty 5

## 2018-06-12 MED ORDER — FENTANYL CITRATE (PF) 100 MCG/2ML IJ SOLN
INTRAMUSCULAR | Status: DC | PRN
  Administered 2018-06-12: 180 ug via INTRAVENOUS
  Administered 2018-06-12: 50 ug via INTRAVENOUS

## 2018-06-12 MED ORDER — ONDANSETRON HCL 4 MG/2ML IJ SOLN
INTRAMUSCULAR | Status: DC | PRN
  Administered 2018-06-12: 4 mg via INTRAVENOUS

## 2018-06-12 MED ORDER — OXYCODONE HCL 5 MG PO TABS: 5.0000 mg | ORAL_TABLET | Freq: Once | ORAL | Status: DC | PRN

## 2018-06-12 MED ORDER — POVIDONE-IODINE 10 % EX SWAB
2.0000 "application " | Freq: Once | CUTANEOUS | Status: AC
  Administered 2018-06-12: 2 via TOPICAL

## 2018-06-12 MED ORDER — ONDANSETRON HCL 4 MG/2ML IJ SOLN: 4.0000 mg | Freq: Once | INTRAMUSCULAR | Status: DC | PRN

## 2018-06-12 MED ORDER — LIDOCAINE HCL (CARDIAC) PF 50 MG/5ML IV SOSY
PREFILLED_SYRINGE | INTRAVENOUS | Status: DC | PRN
  Administered 2018-06-12: 75 mg via INTRAVENOUS

## 2018-06-12 MED ORDER — LACTATED RINGERS IV SOLN
INTRAVENOUS | Status: DC
  Administered 2018-06-12: 1000 mL via INTRAVENOUS

## 2018-06-12 MED ORDER — EPHEDRINE SULFATE 50 MG/ML IJ SOLN
INTRAMUSCULAR | Status: DC | PRN
  Administered 2018-06-12: 5 mg via INTRAVENOUS

## 2018-06-12 MED ORDER — ONDANSETRON HCL 4 MG/2ML IJ SOLN
INTRAMUSCULAR | Status: AC
  Filled 2018-06-12: qty 2

## 2018-06-12 MED ORDER — FENTANYL CITRATE (PF) 250 MCG/5ML IJ SOLN
INTRAMUSCULAR | Status: AC
  Filled 2018-06-12: qty 5

## 2018-06-12 SURGICAL SUPPLY — 36 items
BAG SPEC THK2 15X12 ZIP CLS (MISCELLANEOUS)
BAG ZIPLOCK 12X15 (MISCELLANEOUS) ×1 IMPLANT
BANDAGE ACE 4X5 VEL STRL LF (GAUZE/BANDAGES/DRESSINGS) ×2 IMPLANT
BANDAGE ESMARK 6X9 LF (GAUZE/BANDAGES/DRESSINGS) ×1 IMPLANT
BNDG CMPR 9X6 STRL LF SNTH (GAUZE/BANDAGES/DRESSINGS)
BNDG ESMARK 6X9 LF (GAUZE/BANDAGES/DRESSINGS)
BNDG GAUZE ELAST 4 BULKY (GAUZE/BANDAGES/DRESSINGS) ×3 IMPLANT
COVER SURGICAL LIGHT HANDLE (MISCELLANEOUS) ×3 IMPLANT
CUFF TOURN SGL QUICK 18 (TOURNIQUET CUFF) ×2 IMPLANT
CUFF TOURN SGL QUICK 24 (TOURNIQUET CUFF)
CUFF TOURN SGL QUICK 34 (TOURNIQUET CUFF)
CUFF TRNQT CYL 24X4X40X1 (TOURNIQUET CUFF) IMPLANT
CUFF TRNQT CYL 34X4X40X1 (TOURNIQUET CUFF) IMPLANT
DRAIN PENROSE 18X1/2 LTX STRL (DRAIN) ×1 IMPLANT
DRAIN PENROSE 18X1/4 LTX STRL (WOUND CARE) ×2 IMPLANT
DRAPE SURG 17X11 SM STRL (DRAPES) ×2 IMPLANT
DRSG EMULSION OIL 3X3 NADH (GAUZE/BANDAGES/DRESSINGS) ×2 IMPLANT
DRSG PAD ABDOMINAL 8X10 ST (GAUZE/BANDAGES/DRESSINGS) ×2 IMPLANT
DURAPREP 26ML APPLICATOR (WOUND CARE) ×3 IMPLANT
ELECT REM PT RETURN 15FT ADLT (MISCELLANEOUS) ×3 IMPLANT
GAUZE SPONGE 4X4 12PLY STRL (GAUZE/BANDAGES/DRESSINGS) ×1 IMPLANT
GLOVE BIO SURGEON STRL SZ8 (GLOVE) ×3 IMPLANT
GLOVE ECLIPSE 7.5 STRL STRAW (GLOVE) ×3 IMPLANT
HANDPIECE INTERPULSE COAX TIP (DISPOSABLE)
KIT BASIN OR (CUSTOM PROCEDURE TRAY) ×3 IMPLANT
MANIFOLD NEPTUNE II (INSTRUMENTS) ×3 IMPLANT
PACK ORTHO EXTREMITY (CUSTOM PROCEDURE TRAY) ×3 IMPLANT
PAD ABD 8X10 STRL (GAUZE/BANDAGES/DRESSINGS) ×2 IMPLANT
PAD CAST 4YDX4 CTTN HI CHSV (CAST SUPPLIES) ×1 IMPLANT
PADDING CAST COTTON 4X4 STRL (CAST SUPPLIES) ×3
POSITIONER SURGICAL ARM (MISCELLANEOUS) ×1 IMPLANT
SET HNDPC FAN SPRY TIP SCT (DISPOSABLE) ×1 IMPLANT
SUT ETHILON 3 0 PS 1 (SUTURE) ×2 IMPLANT
SWAB CULTURE ESWAB REG 1ML (MISCELLANEOUS) ×2 IMPLANT
SYR CONTROL 10ML LL (SYRINGE) ×1 IMPLANT
TUBE ANAEROBIC SPECIMEN COL (MISCELLANEOUS) ×2 IMPLANT

## 2018-06-12 NOTE — Transfer of Care (Signed)
Immediate Anesthesia Transfer of Care Note  Patient: Stanley Chaney  Procedure(s) Performed: Excisional Debridement left foot (Left )  Patient Location: PACU  Anesthesia Type:General  Level of Consciousness: awake, alert , oriented and patient cooperative  Airway & Oxygen Therapy: Patient Spontanous Breathing and Patient connected to face mask oxygen  Post-op Assessment: Report given to RN, Post -op Vital signs reviewed and stable and Patient moving all extremities X 4  Post vital signs: stable  Last Vitals:  Vitals Value Taken Time  BP 149/71 06/12/2018  1:47 PM  Temp 36.3 C 06/12/2018  1:47 PM  Pulse 63 06/12/2018  1:55 PM  Resp 19 06/12/2018  1:55 PM  SpO2 98 % 06/12/2018  1:55 PM  Vitals shown include unvalidated device data.  Last Pain:  Vitals:   06/12/18 1347  TempSrc:   PainSc: 0-No pain      Patients Stated Pain Goal: 3 (02/08/55 1537)  Complications: No apparent anesthesia complications

## 2018-06-12 NOTE — Progress Notes (Signed)
Pt was provided with the surgical consent and blood consent through the [place] translator. Pt reports no further questions or concerns.

## 2018-06-12 NOTE — Anesthesia Postprocedure Evaluation (Signed)
Anesthesia Post Note  Patient: Stanley Chaney  Procedure(s) Performed: Excisional Debridement left foot (Left )     Patient location during evaluation: PACU Anesthesia Type: General Level of consciousness: awake and alert Pain management: pain level controlled Vital Signs Assessment: post-procedure vital signs reviewed and stable Respiratory status: spontaneous breathing, nonlabored ventilation, respiratory function stable and patient connected to nasal cannula oxygen Cardiovascular status: blood pressure returned to baseline and stable Postop Assessment: no apparent nausea or vomiting Anesthetic complications: no    Last Vitals:  Vitals:   06/12/18 1547 06/12/18 1649  BP: (!) 171/59 (!) 164/86  Pulse: 66 66  Resp:    Temp: 36.9 C 36.4 C  SpO2: 100% 100%    Last Pain:  Vitals:   06/12/18 1649  TempSrc: Oral  PainSc:                  Arlette Schaad P Ota Ebersole

## 2018-06-12 NOTE — Anesthesia Procedure Notes (Signed)
Procedure Name: LMA Insertion Date/Time: 06/12/2018 1:09 PM Performed by: Lissa Morales, CRNA Pre-anesthesia Checklist: Patient identified, Emergency Drugs available, Suction available and Patient being monitored Patient Re-evaluated:Patient Re-evaluated prior to induction Oxygen Delivery Method: Circle system utilized Preoxygenation: Pre-oxygenation with 100% oxygen Induction Type: IV induction Ventilation: Mask ventilation without difficulty LMA: LMA with gastric port inserted LMA Size: 4.0 Tube type: Oral Number of attempts: 1 Airway Equipment and Method: Oral airway Placement Confirmation: positive ETCO2 Tube secured with: Tape Dental Injury: Teeth and Oropharynx as per pre-operative assessment

## 2018-06-12 NOTE — Progress Notes (Addendum)
[facility]  PROGRESS NOTE  Stanley Chaney LPF:[id number] DOB: 11-04-1960 DOA: 06/10/2018 PCP: Patient, No Pcp Per   Brief HPI:   57 year old male with no significant medical problems came to hospital with puncture wound to left foot followed by swelling.  Patient has not seen a physician for past 21 years.  Patient says that one week ago he stepped on a glass while visiting his son.  After that he started swelling.    Subjective   Patient seen and examined, plan for complaints of the left foot abscess today.  MRI of the foot confirms presence of abscess, no osteomyelitis.  Also shows transverse fracture of the proximal metaphysis of the proximal phalanx small toe.   Assessment/Plan:     1. Left foot abscess-patient is new onset diabetic, continue empiric antibiotics Rocephin and Flagyl.  Orthopedics has seen the patient and plan for drainage of the abscess today.  2. Peripheral arterial disease-ABIs done this morning showed moderate peripheral arterial disease, ABI right 0.59, ABI left 0.73.  Vascular surgery was consulted.  Plan for angiogram early next week.  Will transfer patient to [facility] over the weekend.  Will start aspirin 81 mg p.o. daily  3. Diabetes mellitus-new onset, hemoglobin A1c is 10.4.  Continue sliding scale insulin with NovoLog.  Will start Lantus 10 units subcu daily  4. Transverse fracture of the proximal metaphysis of proximal phalanx-orthopedic surgery following.     CBG: Recent Labs  Lab 06/11/18 1703 06/11/18 2200 06/12/18 0438 06/12/18 0846 06/12/18 1217  GLUCAP 164* 128* 130* 133* 119*    CBC: Recent Labs  Lab 06/10/18 1908 06/11/18 0442 06/12/18 0505  WBC 12.9* 11.1* 8.9  NEUTROABS 9.3*  --   --   HGB 15.9 13.7 13.8  HCT 45.4 40.3 40.3  MCV 91.0 91.4 91.2  PLT 382 365 532    Basic Metabolic Panel: Recent Labs  Lab 06/10/18 1908 06/11/18 0442 06/12/18 0505  NA 137 142 141  K 4.6 4.0 5.0  CL 96* 103 102  CO2 26 28  26   GLUCOSE 250* 155* 149*  BUN 13 9 11   CREATININE 0.90 0.77 0.83  CALCIUM 10.0 9.0 9.0  MG  --  2.1  --   PHOS  --  3.6  --      DVT prophylaxis: Lovenox  Code Status: Full code  Family Communication: No family at bedside  Disposition Plan: likely home when medically ready for discharge   Consultants:  None  Procedures:  None   Antibiotics:   Anti-infectives (From admission, onward)   Start     Dose/Rate Route Frequency Ordered Stop   06/12/18 0600  ceFAZolin (ANCEF) IVPB 2g/100 mL premix     2 g 200 mL/hr over 30 Minutes Intravenous On call to O.R. 06/11/18 2239 06/12/18 1320   06/11/18 0500  metroNIDAZOLE (FLAGYL) IVPB 500 mg     500 mg 100 mL/hr over 60 Minutes Intravenous Every 8 hours 06/10/18 2352     06/11/18 0400  cefTRIAXone (ROCEPHIN) 2 g in sodium chloride 0.9 % 100 mL IVPB     2 g 200 mL/hr over 30 Minutes Intravenous Daily 06/10/18 2352     06/10/18 2115  piperacillin-tazobactam (ZOSYN) IVPB 3.375 g     3.375 g 100 mL/hr over 30 Minutes Intravenous  Once 06/10/18 2109 06/10/18 2218   06/10/18 2115  vancomycin (VANCOCIN) 1,500 mg in sodium chloride 0.9 % 500 mL IVPB     1,500 mg 250 mL/hr over 120 Minutes  Intravenous  Once 06/10/18 2112 06/11/18 0038       Objective   Vitals:   06/12/18 1347 06/12/18 1400 06/12/18 1415 06/12/18 1442  BP: (!) 149/71 (!) 157/72 (!) 157/62 (!) 149/60  Pulse: (!) 55 62 (!) 58 (!) 48  Resp: 17 18 18    Temp: (!) 97.4 F (36.3 C)  (!) 97.5 F (36.4 C) 97.8 F (36.6 C)  TempSrc:    Oral  SpO2: 100% 97% 99% 99%  Weight:      Height:        Intake/Output Summary (Last 24 hours) at 06/12/2018 1500 Last data filed at 06/12/2018 1415 Gross per 24 hour  Intake 1140 ml  Output 5 ml  Net 1135 ml   Filed Weights   06/11/18 0011 06/12/18 1155  Weight: 69.1 kg 72.6 kg     Physical Examination:   Mouth: Oral mucosa is moist, no lesions on palate,  Neck: Supple, no deformities, masses, or tenderness Lungs:  Normal respiratory effort, bilateral clear to auscultation, no crackles or wheezes.  Heart: Regular rate and rhythm, S1 and S2 normal, no murmurs, rubs auscultated Abdomen: BS normoactive,soft,nondistended,non-tender to palpation,no organomegaly Extremities: Left foot in dressing. Neuro : Alert and oriented to time, place and person, No focal deficits      Data Reviewed: I have personally reviewed following labs and imaging studies   Recent Results (from the past 240 hour(s))  Blood culture (routine x 2)     Status: None (Preliminary result)   Collection Time: 06/10/18  9:10 PM  Result Value Ref Range Status   Specimen Description   Final    BLOOD RIGHT FOREARM Performed at [facility], [place] [address] [place], [place] [address]    Special Requests   Final    BOTTLES DRAWN AEROBIC AND ANAEROBIC Blood Culture adequate volume Performed at [facility] [address], [place], [place] [address]    Culture   Final    NO GROWTH 2 DAYS Performed at [facility] [address] [place], [place] [address]    Report Status PENDING  Incomplete  Blood culture (routine x 2)     Status: None (Preliminary result)   Collection Time: 06/10/18  9:15 PM  Result Value Ref Range Status   Specimen Description   Final    BLOOD LEFT ANTECUBITAL Performed at [facility] [address], [place], [place] [address]    Special Requests   Final    BOTTLES DRAWN AEROBIC AND ANAEROBIC Blood Culture adequate volume Performed at [facility] [address], [place], [place] [address]    Culture   Final    NO GROWTH 2 DAYS Performed at [facility] [address] [place], [place] [address]    Report Status PENDING  Incomplete  MRSA PCR Screening     Status: None   Collection Time: 06/11/18 12:30 AM  Result Value Ref Range Status   MRSA by PCR NEGATIVE NEGATIVE Final    Comment:        The GeneXpert MRSA Assay  (FDA approved for NASAL specimens only), is one component of a comprehensive MRSA colonization surveillance program. It is not intended to diagnose MRSA infection nor to guide or monitor treatment for MRSA infections. Performed at [facility], [place] [address] [place], [place] [address]      Liver Function Tests: Recent Labs  Lab 06/10/18 1908 06/11/18 0442 06/12/18 0505  AST  17 13* 15  ALT 14 12 12   ALKPHOS 111 87 91  BILITOT 0.6 0.9 0.8  PROT 8.7* 7.0 7.0  ALBUMIN 3.7 3.0* 2.9*   No results for input(s): LIPASE, AMYLASE in the last 168 hours. No results for input(s): AMMONIA in the last 168 hours.  Cardiac Enzymes: No results for input(s): CKTOTAL, CKMB, CKMBINDEX, TROPONINI in the last 168 hours. BNP (last 3 results) No results for input(s): BNP in the last 8760 hours.  ProBNP (last 3 results) No results for input(s): PROBNP in the last 8760 hours.    Studies: Dg Chest 2 View  Result Date: 06/11/2018 CLINICAL DATA:  Abnormal lung sounds. EXAM: CHEST - 2 VIEW COMPARISON:  None. FINDINGS: The lungs are clear without focal pneumonia, edema, pneumothorax or pleural effusion. The cardiopericardial silhouette is within normal limits for size. The visualized bony structures of the thorax are intact. IMPRESSION: No active cardiopulmonary disease. Electronically Signed   By: Misty Stanley M.D.   On: 06/11/2018 08:42   Mr Foot Left W Wo Contrast  Result Date: 06/12/2018 CLINICAL DATA:  Foot swelling and diabetes. Stepped on glass 10 days ago. Discoloration. EXAM: MRI OF THE LEFT FOREFOOT WITHOUT AND WITH CONTRAST TECHNIQUE: Multiplanar, multisequence MR imaging of the left forefoot was performed both before and after administration of intravenous contrast. Imaging was performed from about the level of the Chopart joint to the proximal phalanges. The distal toes and much of the hindfoot was excluded. CONTRAST:  6 cc Gadavist COMPARISON:  None. FINDINGS:  Bones/Joint/Cartilage Transverse fracture of the proximal metaphysis proximal phalanx of the small toe, as shown on conventional radiographs of 06/10/2018, with associated edema and enhancement in the proximal phalanx as shown for example on image 24/7. There is a small erosion or geode along the medial head of the first metatarsal. No findings of osteomyelitis in the imaged portion of the foot. Ligaments Lisfranc ligament intact. Muscles and Tendons There is abnormal thickening of the medial band of the plantar fascia proximally irregularity and likely some mild partial tearing or laceration of the medial band plantar fascia below the cuneiform is a and about at the level of the Lisfranc joint. In this vicinity there is a 3.0 by 0.6 by 3.3 cm (volume = 3 cm^3) nonenhancing fluid collection along the superior margin of the irregular medial band of the plantar fascia suspicious for abscess., but with only mild enhancement along its margins. In addition, there is a small amount of fluid tracking within along the medial margin of the flexor digitorum brevis muscle for example on images 44 through 37 of series 4. This has some adjacent infiltrative inflammatory stranding is suspicious for phlegmon and potentially incipient abscess, outlining the fascia margin and measuring about 1.4 by 0.9 by 2.3 cm (volume = 2 cm^3). There is low-level edema in the plantar musculature of the foot with some mild enhancement particularly in the flexor digitorum brevis. Edema tracks along some of the fascia planes in the plantar foot, for example between the proximal half of the first metatarsal and adjacent flexor hallucis brevis muscle. Soft tissues Subcutaneous edema noted in the soft tissues plantar to the first MTP joint. On the lateral radiograph there is a tiny density in this vicinity which could conceivably be a foreign body, but no foreign body is observed on MRI. IMPRESSION: 1. Irregularity and partial disruption of the medial  band of the plantar fascia below the Lisfranc joint, with overlying superficial 3 cubic cm of fluid signal intensity suspicious  for early abscess. 2. Smaller phlegmon or incipient abscess slightly further proximally along the medial margin of the flexor digitorum brevis muscle. 3. Focal subcutaneous edema and enhancement plantar to the first MTP joint. Although there is some faint calcification or similar density in this vicinity on the recent radiographs, I do not see a definite foreign body on the MRI images. 4. Transverse fracture the proximal metaphysis of the proximal phalanx small toe. I discussed the findings along the medial band of the plantar fascia by telephone at the time of interpretation on 06/12/2018 at 9:46 am to Dr. [name] , who verbally acknowledged these results. Electronically Signed   By: Van Clines M.D.   On: 06/12/2018 10:16   Dg Foot Complete Left  Result Date: 06/10/2018 CLINICAL DATA:  Plantar foot wound, swelling EXAM: LEFT FOOT - COMPLETE 3+ VIEW COMPARISON:  None. FINDINGS: Fracture involving the proximal shaft of the 5th proximal phalanx. Mild degenerative changes involving the 1st MTP joint. Visualized soft tissues are grossly unremarkable in this patient with known plantar soft tissue wound. No radiopaque foreign body is seen. IMPRESSION: Fracture involving the proximal shaft of the 5th proximal phalanx. Electronically Signed   By: Julian Hy M.D.   On: 06/10/2018 19:20    Scheduled Meds: . aspirin EC  81 mg Oral Daily  . [START ON 06/13/2018] enoxaparin (LOVENOX) injection  40 mg Subcutaneous Daily  . insulin aspart  0-9 Units Subcutaneous Q4H  . nicotine  21 mg Transdermal Daily  . nutrition supplement (JUVEN)  1 packet Oral BID BM      Time spent: 25 min  [facility] Pager [phone]. If 7PM-7AM, please contact night-coverage at [email or web address], Office  [phone]  password [email or web address]  06/12/2018, 3:00 PM  LOS: 2 days

## 2018-06-12 NOTE — Op Note (Signed)
NAMEThurmond, Hildebran [facility] MEDICAL RECORD [id number] ACCOUNT [id number] DATE OF BIRTH:1961/04/15 FACILITY: [facility] LOCATION: [facility] PHYSICIAN:Alicea Wente L. Krystle Polcyn, MD  OPERATIVE REPORT  DATE OF PROCEDURE:  06/12/2018  PREOPERATIVE DIAGNOSIS:  Complex infection, left foot.    POSTOPERATIVE DIAGNOSIS:  Complex infection, left foot.  PROCEDURE:1. Incision and drainage below fascia foe foot infection.   2.Excisional debridement of skin, subcutaneous tissue, muscle, fascia at the site of a complex infection with scalpel, pickups, scissors and rongeur .  SURGEON:  [name], MD  ASSISTANT:  None.  ANESTHESIA:  General.  BRIEF HISTORY:  The patient is a 57 year old male with a history of significant swelling and some pain on the medial aspect of his left foot.  He said it had been going on for about 8-10 days.  He was admitted with an abscess in the foot.  We got an MRI  which showed that the abscess was relatively contained in that medial band of the plantar fascia and then tracking somewhat medially.  He was also having issues with circulation.  He had some ABIs which were bad, and he is going to be studied for  potential revascularization early next week.  I felt that with this abscess, he will probably need to address it, and he was taken to the operating room for this procedure.  DESCRIPTION OF PROCEDURE:  The patient was taken to the operating room.  After adequate anesthesia was obtained with a general anesthetic, the patient was brought to the operating table.  Left leg was elevated for 3 minutes and a blood pressure  tourniquet inflated to 250 mmHg.  A 1-inch incision was made over the draining area of the foot.  Significant amounts of epidermis were removed through the incision.  We followed the tract of pus that went up medially, posteriorly blow the fascia.  The medial band of  the plantar fascia was mostly involved.  There were bands of tissue that were exposed in the wound, and we  debrided this with the scissor. We used scalpel and rongeur to clean up area as well. We debrided some skin and fascia with the scissor as well.  At this point, we irrigated thoroughly with a liter of  normal saline irrigation.  I closed it with 2 interrupted sutures over a Penrose drain.  A sterile compressive dressing was applied, and the patient was taken to recovery and was noted to be in satisfactory condition.  Estimated blood loss for procedure  was minimal.  LN/NUANCE  D:06/12/2018 T:06/12/2018 JOB:[id number]

## 2018-06-12 NOTE — Anesthesia Preprocedure Evaluation (Addendum)
Anesthesia Evaluation  Patient identified by MRN, date of birth, ID band Patient awake    Reviewed: Allergy & Precautions, NPO status , Patient's Chart, lab work & pertinent test results  History of Anesthesia Complications Negative for: history of anesthetic complications  Airway Mallampati: III  TM Distance: >3 FB Neck ROM: Full    Dental  (+) Missing, Poor Dentition Very poor dentition with multiple missing and broken teeth but per patient none are loose:   Pulmonary Current Smoker,    breath sounds clear to auscultation       Cardiovascular + Peripheral Vascular Disease   Rhythm:Regular Rate:Normal     Neuro/Psych negative neurological ROS  negative psych ROS   GI/Hepatic negative GI ROS, Neg liver ROS,   Endo/Other  diabetes (A1c 10), Poorly Controlled  Renal/GU negative Renal ROS  negative genitourinary   Musculoskeletal negative musculoskeletal ROS (+)   Abdominal   Peds  Hematology negative hematology ROS (+)   Anesthesia Other Findings   Reproductive/Obstetrics                           Anesthesia Physical Anesthesia Plan  ASA: III  Anesthesia Plan: General   Post-op Pain Management:    Induction: Intravenous  PONV Risk Score and Plan: 1 and Ondansetron, Treatment may vary due to age or medical condition and Midazolam  Airway Management Planned: LMA  Additional Equipment: None  Intra-op Plan:   Post-operative Plan: Extubation in OR  Informed Consent: I have reviewed the patients History and Physical, chart, labs and discussed the procedure including the risks, benefits and alternatives for the proposed anesthesia with the patient or authorized representative who has indicated his/her understanding and acceptance.   Dental advisory given  Plan Discussed with:   Anesthesia Plan Comments:      Anesthesia Quick Evaluation

## 2018-06-12 NOTE — Brief Op Note (Signed)
06/12/2018  1:36 PM  PATIENT:  Roosvelt Harps  57 y.o. male  PRE-OPERATIVE DIAGNOSIS:  infected left foot  POST-OPERATIVE DIAGNOSIS:  infected left foot  PROCEDURE:  Procedure(s): IRRIGATION AND DEBRIDEMENT EXTREMITY (Left)  SURGEON:  Surgeon(s) and Role:    [name], MD - Primary  PHYSICIAN ASSISTANT:   ASSISTANTS: bethune   ANESTHESIA:   general  EBL:  5 mL   BLOOD ADMINISTERED:none  DRAINS: Penrose drain in the l foot   LOCAL MEDICATIONS USED:  NONE  SPECIMEN:  No Specimen  DISPOSITION OF SPECIMEN:  N/A  COUNTS:  YES  TOURNIQUET:   Total Tourniquet Time Documented: Calf (Left) - 5 minutes Total: Calf (Left) - 5 minutes   DICTATION: .Other Dictation: Dictation Number [id number]  PLAN OF CARE: Admit to inpatient   PATIENT DISPOSITION:  PACU - hemodynamically stable.   Delay start of Pharmacological VTE agent (>24hrs) due to surgical blood loss or risk of bleeding: no

## 2018-06-12 NOTE — Progress Notes (Signed)
Pt practiced pricking his finger to check his blood sugar. Pt did well and expressed excitement that he was able to do so. I attempted to help the pt practice administering insulin, but the pt wanted to watch me administer it one more time. Pt will attempt next time per the pt.

## 2018-06-13 ENCOUNTER — Encounter (HOSPITAL_COMMUNITY): Payer: Self-pay | Admitting: Orthopedic Surgery

## 2018-06-13 DIAGNOSIS — I10 Essential (primary) hypertension: Secondary | ICD-10-CM

## 2018-06-13 LAB — GLUCOSE, CAPILLARY
Glucose-Capillary: 114 mg/dL — ABNORMAL HIGH (ref 70–99)
Glucose-Capillary: 107 mg/dL — ABNORMAL HIGH (ref 70–99)
Glucose-Capillary: 142 mg/dL — ABNORMAL HIGH (ref 70–99)
Glucose-Capillary: 165 mg/dL — ABNORMAL HIGH (ref 70–99)
Glucose-Capillary: 134 mg/dL — ABNORMAL HIGH (ref 70–99)

## 2018-06-13 LAB — CBC
HCT: 39.3 % (ref 39.0–52.0)
Hemoglobin: 13.4 g/dL (ref 13.0–17.0)
MCH: 30.8 pg (ref 26.0–34.0)
MCHC: 34.1 g/dL (ref 30.0–36.0)
MCV: 90.3 fL (ref 78.0–100.0)
Platelets: 479 10*3/uL — ABNORMAL HIGH (ref 150–400)
RBC: 4.35 MIL/uL (ref 4.22–5.81)
RDW: 11.9 % (ref 11.5–15.5)
WBC: 9.4 10*3/uL (ref 4.0–10.5)

## 2018-06-13 LAB — BASIC METABOLIC PANEL
Anion gap: 11 (ref 5–15)
BUN: 9 mg/dL (ref 6–20)
CO2: 29 mmol/L (ref 22–32)
Calcium: 8.8 mg/dL — ABNORMAL LOW (ref 8.9–10.3)
Chloride: 100 mmol/L (ref 98–111)
Creatinine, Ser: 0.69 mg/dL (ref 0.61–1.24)
GFR calc Af Amer: >60 mL/min (ref >60)
GFR calc non Af Amer: >60 mL/min (ref >60)
Glucose, Bld: 113 mg/dL — ABNORMAL HIGH (ref 70–99)
Potassium: 3.6 mmol/L (ref 3.5–5.1)
Sodium: 140 mmol/L (ref 135–145)

## 2018-06-13 MED ORDER — LISINOPRIL 10 MG PO TABS
10.0000 mg | ORAL_TABLET | Freq: Every day | ORAL | Status: DC
  Administered 2018-06-13 – 2018-06-16 (×4): 10 mg via ORAL
  Filled 2018-06-13 (×4): qty 1

## 2018-06-13 NOTE — Plan of Care (Signed)

## 2018-06-13 NOTE — Progress Notes (Addendum)
    Patient doing well after L foot I/D per Dr [name] team, followed by medicine team. He reports very little to no pain around the foot, he slept well, has not been up out of bed yet, + passing gas, denies N/V/D/SOB   Physical Exam: Vitals:   06/12/18 2318 06/13/18 1400  BP: (!) 177/76 (!) 152/67  Pulse: (!) 57 (!) 50  Resp: 16   Temp: 98.2 F (36.8 C) 98.2 F (36.8 C)  SpO2: 100% 99%    Dressing in place, CDI, pt resting comfortably in bed, cap refill <2sec and sym BIL. NVI  POD #1 s/p  L foot I/D per Dr [name] team  - up with PT/OT, encourage ambulation - TDWB as tolerated with crutches - F/U 10 days PO with Dr [name] - Cont meds and management per hospitalist team -

## 2018-06-13 NOTE — Progress Notes (Signed)
[facility]  PROGRESS NOTE  Stanley Chaney ZJI:[id number] DOB: 1960-10-16 DOA: 06/10/2018 PCP: Patient, No Pcp Per   Brief HPI:   57 year old male with no significant medical problems came to hospital with puncture wound to left foot followed by swelling.  Patient has not seen a physician for past 21 years.  Patient says that one week ago he stepped on a glass while visiting his son.  After that he started swelling.    Subjective   Patient seen and examined, status post incision and debridement of the left foot abscess.   Assessment/Plan:     1. Left foot abscess-patient is new onset diabetic, continue empiric antibiotics Rocephin and Flagyl.  Orthopedics saw the patient and he underwent debridement of left foot abscess.  2. Peripheral arterial disease-ABIs done this morning showed moderate peripheral arterial disease, ABI right 0.59, ABI left 0.73.  Vascular surgery was consulted.  Plan for angiogram early next week.  Will transfer patient to [facility] over the weekend.  Will start aspirin 81 mg p.o. daily  3. Diabetes mellitus-new onset, hemoglobin A1c is 10.4.  Continue sliding scale insulin with NovoLog.  Will start Lantus 10 units subcu daily  4. Transverse fracture of the proximal metaphysis of proximal phalanx-orthopedic surgery following.  5. Hypertension-blood pressure is elevated, will start lisinopril 10 mg p.o. daily     CBG: Recent Labs  Lab 06/12/18 2038 06/12/18 2316 06/13/18 0429 06/13/18 0844 06/13/18 1227  GLUCAP 168* 155* 114* 107* 142*    CBC: Recent Labs  Lab 06/10/18 1908 06/11/18 0442 06/12/18 0505 06/13/18 0438  WBC 12.9* 11.1* 8.9 9.4  NEUTROABS 9.3*  --   --   --   HGB 15.9 13.7 13.8 13.4  HCT 45.4 40.3 40.3 39.3  MCV 91.0 91.4 91.2 90.3  PLT 382 365 398 479*    Basic Metabolic Panel: Recent Labs  Lab 06/10/18 1908 06/11/18 0442 06/12/18 0505 06/13/18 0438  NA 137 142 141 140  K 4.6 4.0 5.0 3.6  CL 96* 103 102 100   CO2 26 28 26 29   GLUCOSE 250* 155* 149* 113*  BUN 13 9 11 9   CREATININE 0.90 0.77 0.83 0.69  CALCIUM 10.0 9.0 9.0 8.8*  MG  --  2.1  --   --   PHOS  --  3.6  --   --      DVT prophylaxis: Lovenox  Code Status: Full code  Family Communication: No family at bedside  Disposition Plan: likely home when medically ready for discharge   Consultants:  None  Procedures:  None   Antibiotics:   Anti-infectives (From admission, onward)   Start     Dose/Rate Route Frequency Ordered Stop   06/12/18 0600  ceFAZolin (ANCEF) IVPB 2g/100 mL premix     2 g 200 mL/hr over 30 Minutes Intravenous On call to O.R. 06/11/18 2239 06/12/18 1320   06/11/18 0500  metroNIDAZOLE (FLAGYL) IVPB 500 mg     500 mg 100 mL/hr over 60 Minutes Intravenous Every 8 hours 06/10/18 2352     06/11/18 0400  cefTRIAXone (ROCEPHIN) 2 g in sodium chloride 0.9 % 100 mL IVPB     2 g 200 mL/hr over 30 Minutes Intravenous Daily 06/10/18 2352     06/10/18 2115  piperacillin-tazobactam (ZOSYN) IVPB 3.375 g     3.375 g 100 mL/hr over 30 Minutes Intravenous  Once 06/10/18 2109 06/10/18 2218   06/10/18 2115  vancomycin (VANCOCIN) 1,500 mg in sodium chloride 0.9 % 500  mL IVPB     1,500 mg 250 mL/hr over 120 Minutes Intravenous  Once 06/10/18 2112 06/11/18 0038       Objective   Vitals:   06/12/18 1547 06/12/18 1649 06/12/18 1726 06/12/18 2318  BP: (!) 171/59 (!) 164/86 (!) 169/64 (!) 177/76  Pulse: 66 66 60 (!) 57  Resp: 18 18 18 16   Temp: 98.4 F (36.9 C) 97.6 F (36.4 C) 98 F (36.7 C) 98.2 F (36.8 C)  TempSrc: Oral Oral Oral Oral  SpO2: 100% 100% 99% 100%  Weight:      Height:        Intake/Output Summary (Last 24 hours) at 06/13/2018 1237 Last data filed at 06/13/2018 0956 Gross per 24 hour  Intake 1120 ml  Output 5 ml  Net 1115 ml   Filed Weights   06/11/18 0011 06/12/18 1155  Weight: 69.1 kg 72.6 kg     Physical Examination:  Mouth: Oral mucosa is moist, no lesions on palate,  Neck:  Supple, no deformities, masses, or tenderness Lungs: Normal respiratory effort, bilateral clear to auscultation, no crackles or wheezes.  Heart: Regular rate and rhythm, S1 and S2 normal, no murmurs, rubs auscultated Abdomen: BS normoactive,soft,nondistended,non-tender to palpation,no organomegaly Extremities: Left foot in dressing Neuro : Alert and oriented to time, place and person, No focal deficits    Data Reviewed: I have personally reviewed following labs and imaging studies   Recent Results (from the past 240 hour(s))  Blood culture (routine x 2)     Status: None (Preliminary result)   Collection Time: 06/10/18  9:10 PM  Result Value Ref Range Status   Specimen Description   Final    BLOOD RIGHT FOREARM Performed at [facility], [place] [address], [place], [place] [address]    Special Requests   Final    BOTTLES DRAWN AEROBIC AND ANAEROBIC Blood Culture adequate volume Performed at [facility] [address], [place], [place] [address]    Culture   Final    NO GROWTH 2 DAYS Performed at [facility] [address], [place], [place] [address]    Report Status PENDING  Incomplete  Blood culture (routine x 2)     Status: None (Preliminary result)   Collection Time: 06/10/18  9:15 PM  Result Value Ref Range Status   Specimen Description   Final    BLOOD LEFT ANTECUBITAL Performed at [facility] [address], [place], [place] [address]    Special Requests   Final    BOTTLES DRAWN AEROBIC AND ANAEROBIC Blood Culture adequate volume Performed at [facility] [address] [place], [place] [address]    Culture   Final    NO GROWTH 2 DAYS Performed at [facility] [address] [place], [place] [address]    Report Status PENDING  Incomplete  MRSA PCR Screening     Status: None   Collection Time: 06/11/18 12:30 AM  Result Value Ref Range Status   MRSA by PCR NEGATIVE NEGATIVE Final     Comment:        The GeneXpert MRSA Assay (FDA approved for NASAL specimens only), is one component of a comprehensive MRSA colonization surveillance program. It is not intended to diagnose MRSA infection nor to guide or monitor treatment for MRSA infections. Performed at [facility], [place] [address], [place], [place] [address]   Aerobic/Anaerobic Culture (surgical/deep wound)     Status:  None (Preliminary result)   Collection Time: 06/12/18  1:24 PM  Result Value Ref Range Status   Specimen Description   Final    FOOT LEFT SOFT TISSUE Performed at [facility] [address] [place], [place] [address]    Special Requests   Final    NONE Performed at [facility], [place] [address] [place], [place] [address]    Gram Stain   Final    FEW WBC PRESENT, PREDOMINANTLY PMN MODERATE GRAM POSITIVE COCCI IN PAIRS AND CHAINS    Culture   Final    TOO YOUNG TO READ Performed at [facility], [place] [address] [place], [place] [address]    Report Status PENDING  Incomplete     Liver Function Tests: Recent Labs  Lab 06/10/18 1908 06/11/18 0442 06/12/18 0505  AST 17 13* 15  ALT 14 12 12   ALKPHOS 111 87 91  BILITOT 0.6 0.9 0.8  PROT 8.7* 7.0 7.0  ALBUMIN 3.7 3.0* 2.9*   No results for input(s): LIPASE, AMYLASE in the last 168 hours. No results for input(s): AMMONIA in the last 168 hours.  Cardiac Enzymes: No results for input(s): CKTOTAL, CKMB, CKMBINDEX, TROPONINI in the last 168 hours. BNP (last 3 results) No results for input(s): BNP in the last 8760 hours.  ProBNP (last 3 results) No results for input(s): PROBNP in the last 8760 hours.    Studies: Mr Foot Left W Wo Contrast  Result Date: 06/12/2018 CLINICAL DATA:  Foot swelling and diabetes. Stepped on glass 10 days ago. Discoloration. EXAM: MRI OF THE LEFT FOREFOOT WITHOUT AND WITH CONTRAST TECHNIQUE: Multiplanar, multisequence MR imaging of the left  forefoot was performed both before and after administration of intravenous contrast. Imaging was performed from about the level of the Chopart joint to the proximal phalanges. The distal toes and much of the hindfoot was excluded. CONTRAST:  6 cc Gadavist COMPARISON:  None. FINDINGS: Bones/Joint/Cartilage Transverse fracture of the proximal metaphysis proximal phalanx of the small toe, as shown on conventional radiographs of 06/10/2018, with associated edema and enhancement in the proximal phalanx as shown for example on image 24/7. There is a small erosion or geode along the medial head of the first metatarsal. No findings of osteomyelitis in the imaged portion of the foot. Ligaments Lisfranc ligament intact. Muscles and Tendons There is abnormal thickening of the medial band of the plantar fascia proximally irregularity and likely some mild partial tearing or laceration of the medial band plantar fascia below the cuneiform is a and about at the level of the Lisfranc joint. In this vicinity there is a 3.0 by 0.6 by 3.3 cm (volume = 3 cm^3) nonenhancing fluid collection along the superior margin of the irregular medial band of the plantar fascia suspicious for abscess., but with only mild enhancement along its margins. In addition, there is a small amount of fluid tracking within along the medial margin of the flexor digitorum brevis muscle for example on images 44 through 37 of series 4. This has some adjacent infiltrative inflammatory stranding is suspicious for phlegmon and potentially incipient abscess, outlining the fascia margin and measuring about 1.4 by 0.9 by 2.3 cm (volume = 2 cm^3). There is low-level edema in the plantar musculature of the foot with some mild enhancement particularly in the flexor digitorum brevis. Edema tracks along some of the fascia planes in the plantar foot, for example between the proximal half of the first metatarsal and adjacent flexor hallucis brevis  muscle. Soft tissues  Subcutaneous edema noted in the soft tissues plantar to the first MTP joint. On the lateral radiograph there is a tiny density in this vicinity which could conceivably be a foreign body, but no foreign body is observed on MRI. IMPRESSION: 1. Irregularity and partial disruption of the medial band of the plantar fascia below the Lisfranc joint, with overlying superficial 3 cubic cm of fluid signal intensity suspicious for early abscess. 2. Smaller phlegmon or incipient abscess slightly further proximally along the medial margin of the flexor digitorum brevis muscle. 3. Focal subcutaneous edema and enhancement plantar to the first MTP joint. Although there is some faint calcification or similar density in this vicinity on the recent radiographs, I do not see a definite foreign body on the MRI images. 4. Transverse fracture the proximal metaphysis of the proximal phalanx small toe. I discussed the findings along the medial band of the plantar fascia by telephone at the time of interpretation on 06/12/2018 at 9:46 am to Dr. [name] , who verbally acknowledged these results. Electronically Signed   By: Van Clines M.D.   On: 06/12/2018 10:16    Scheduled Meds: . aspirin EC  81 mg Oral Daily  . enoxaparin (LOVENOX) injection  40 mg Subcutaneous Daily  . insulin aspart  0-9 Units Subcutaneous Q4H  . insulin glargine  10 Units Subcutaneous QHS  . nicotine  21 mg Transdermal Daily  . nutrition supplement (JUVEN)  1 packet Oral BID BM      Time spent: 25 min  [facility] Pager [phone]. If 7PM-7AM, please contact night-coverage at [email or web address], Office  [phone]  password [email or web address]  06/13/2018, 12:37 PM  LOS: 3 days

## 2018-06-14 LAB — GLUCOSE, CAPILLARY
Glucose-Capillary: 149 mg/dL — ABNORMAL HIGH (ref 70–99)
Glucose-Capillary: 148 mg/dL — ABNORMAL HIGH (ref 70–99)
Glucose-Capillary: 149 mg/dL — ABNORMAL HIGH (ref 70–99)
Glucose-Capillary: 122 mg/dL — ABNORMAL HIGH (ref 70–99)
Glucose-Capillary: 172 mg/dL — ABNORMAL HIGH (ref 70–99)

## 2018-06-14 MED ORDER — AMOXICILLIN-POT CLAVULANATE 875-125 MG PO TABS
1.0000 | ORAL_TABLET | Freq: Two times a day (BID) | ORAL | Status: DC
  Administered 2018-06-14 – 2018-06-16 (×4): 1 via ORAL
  Filled 2018-06-14 (×5): qty 1

## 2018-06-14 NOTE — Progress Notes (Signed)
   Patient doing well after L foot I/D per Dr [name] team, followed by medicine team. He continues to report very little to no pain around the foot, he slept well,  + passing gas, denies N/V/D/SOB   Physical Exam: BP 129/69   Pulse (!) 48   Temp 98.3 F (36.8 C) (Oral)   Resp 16   Ht 5\' 11"  (1.803 m)   Wt 72.6 kg   SpO2 100%   BMI 22.32 kg/m    Dressing in place, CDI, pt resting comfortably in bed, cap refill <2sec and sym BIL. NVI  POD #2 s/p  L foot I/D per Dr [name] team  - up with PT/OT, encourage ambulation - TDWB as tolerated with crutches - F/U 10 days PO with Dr [name] - Cont meds and management per hospitalist team - Call ortho with any additional questions [phone]

## 2018-06-14 NOTE — Progress Notes (Signed)
Vascular Surgery  Patient is scheduled for angiogram in AM. She will need to be transferred to [facility] first thing in AM if she is to have the procedure tomorrow.   Deitra Mayo, MD, [place] [phone] Office: [phone]

## 2018-06-14 NOTE — Progress Notes (Signed)
[facility]  PROGRESS NOTE  Jonny Dearden VPX:[id number] DOB: May 27, 1961 DOA: 06/10/2018 PCP: Patient, No Pcp Per   Brief HPI:   57 year old male with no significant medical problems came to hospital with puncture wound to left foot followed by swelling.  Patient has not seen a physician for past 21 years.  Patient says that one week ago he stepped on a glass while visiting his son.  After that he started swelling.    Subjective   Patient seen and examined, denies any complaints.  Status post debridement of left foot abscess per orthopedics.   Assessment/Plan:     1. Left foot abscess-patient is new onset diabetic, he was started on empiric antibiotics Rocephin and Flagyl.  Orthopedics was consulted and patient underwent debridement of left foot abscess.  MRI of the foot showed no osteo myelitis.  Patient can follow-up Dr. [name] in 10 days.  Will switch IV antibiotics to Augmentin 1 tablet p.o. twice daily for 7 more days starting from today.  Follow wound culture results and change antibiotics as needed.  2. Peripheral arterial disease-ABIs done in the hospital morning showed moderate peripheral arterial disease, ABI right 0.59, ABI left 0.73.  Vascular surgery was consulted.  Plan for angiogram early next week.  Will transfer patient to [facility] as per vascular surgery recommendation.  Will continue aspirin 81 mg p.o. daily  3. Diabetes mellitus type II-new onset, hemoglobin A1c is 10.4.  Continue sliding scale insulin with NovoLog.  Started on Lantus 10 units subcu daily.  Consider starting metformin at the time of discharge.  Blood glucose is well controlled.  4. Transverse fracture of the proximal metaphysis of proximal phalanx-follow-up Dr. [name] as outpatient in 10 days.  5. Hypertension-blood pressure was elevated, started on lisinopril 10 mg p.o. daily.  Blood pressure is adequately controlled     CBG: Recent Labs  Lab 06/13/18 1227 06/13/18 1750  06/13/18 2110 06/14/18 0545 06/14/18 0742  GLUCAP 142* 165* 134* 149* 148*    CBC: Recent Labs  Lab 06/10/18 1908 06/11/18 0442 06/12/18 0505 06/13/18 0438  WBC 12.9* 11.1* 8.9 9.4  NEUTROABS 9.3*  --   --   --   HGB 15.9 13.7 13.8 13.4  HCT 45.4 40.3 40.3 39.3  MCV 91.0 91.4 91.2 90.3  PLT 382 365 398 479*    Basic Metabolic Panel: Recent Labs  Lab 06/10/18 1908 06/11/18 0442 06/12/18 0505 06/13/18 0438  NA 137 142 141 140  K 4.6 4.0 5.0 3.6  CL 96* 103 102 100  CO2 26 28 26 29   GLUCOSE 250* 155* 149* 113*  BUN 13 9 11 9   CREATININE 0.90 0.77 0.83 0.69  CALCIUM 10.0 9.0 9.0 8.8*  MG  --  2.1  --   --   PHOS  --  3.6  --   --      DVT prophylaxis: Lovenox  Code Status: Full code  Family Communication: No family at bedside  Disposition Plan: Patient will be transferred to [facility], discussed with Dr. Domenic Polite   Consultants:  None  Procedures:  None   Antibiotics:   Anti-infectives (From admission, onward)   Start     Dose/Rate Route Frequency Ordered Stop   06/12/18 0600  ceFAZolin (ANCEF) IVPB 2g/100 mL premix     2 g 200 mL/hr over 30 Minutes Intravenous On call to O.R. 06/11/18 2239 06/12/18 1320   06/11/18 0500  metroNIDAZOLE (FLAGYL) IVPB 500 mg     500 mg  100 mL/hr over 60 Minutes Intravenous Every 8 hours 06/10/18 2352     06/11/18 0400  cefTRIAXone (ROCEPHIN) 2 g in sodium chloride 0.9 % 100 mL IVPB     2 g 200 mL/hr over 30 Minutes Intravenous Daily 06/10/18 2352     06/10/18 2115  piperacillin-tazobactam (ZOSYN) IVPB 3.375 g     3.375 g 100 mL/hr over 30 Minutes Intravenous  Once 06/10/18 2109 06/10/18 2218   06/10/18 2115  vancomycin (VANCOCIN) 1,500 mg in sodium chloride 0.9 % 500 mL IVPB     1,500 mg 250 mL/hr over 120 Minutes Intravenous  Once 06/10/18 2112 06/11/18 0038       Objective   Vitals:   06/12/18 2318 06/13/18 1400 06/13/18 2104 06/14/18 0546  BP: (!) 177/76 (!) 152/67 (!) 175/71 129/69   Pulse: (!) 57 (!) 50 62 (!) 48  Resp: 16  16 16   Temp: 98.2 F (36.8 C) 98.2 F (36.8 C) 98.1 F (36.7 C) 98.3 F (36.8 C)  TempSrc: Oral Oral Oral Oral  SpO2: 100% 99% 99% 100%  Weight:      Height:        Intake/Output Summary (Last 24 hours) at 06/14/2018 1025 Last data filed at 06/14/2018 0600 Gross per 24 hour  Intake 880 ml  Output -  Net 880 ml   Filed Weights   06/11/18 0011 06/12/18 1155  Weight: 69.1 kg 72.6 kg     Physical Examination:   Mouth: Oral mucosa is moist, no lesions on palate,  Neck: Supple, no deformities, masses, or tenderness Lungs: Normal respiratory effort, bilateral clear to auscultation, no crackles or wheezes.  Heart: Regular rate and rhythm, S1 and S2 normal, no murmurs, rubs auscultated Abdomen: BS normoactive,soft,nondistended,non-tender to palpation,no organomegaly Extremities: Left foot in dressing Neuro : Alert and oriented to time, place and person, No focal deficits Skin: No rashes seen on exam   Data Reviewed: I have personally reviewed following labs and imaging studies   Recent Results (from the past 240 hour(s))  Blood culture (routine x 2)     Status: None (Preliminary result)   Collection Time: 06/10/18  9:10 PM  Result Value Ref Range Status   Specimen Description   Final    BLOOD RIGHT FOREARM Performed at [facility], [address], [place], [place] [address]    Special Requests   Final    BOTTLES DRAWN AEROBIC AND ANAEROBIC Blood Culture adequate volume Performed at [facility] [address], [place], [place] [address]    Culture   Final    NO GROWTH 3 DAYS Performed at [facility], [place] [address], [place], [place] [address]    Report Status PENDING  Incomplete  Blood culture (routine x 2)     Status: None (Preliminary result)   Collection Time: 06/10/18  9:15 PM  Result Value Ref Range Status   Specimen Description   Final    BLOOD LEFT ANTECUBITAL Performed at [facility] [address], [place], [place] [address]    Special Requests   Final    BOTTLES DRAWN AEROBIC AND ANAEROBIC Blood Culture adequate volume Performed at [facility] [address] [place],  [address]    Culture   Final    NO GROWTH 3 DAYS Performed at [facility], [place] [address], [place],  [address]    Report Status PENDING  Incomplete  MRSA PCR Screening  Status: None   Collection Time: 06/11/18 12:30 AM  Result Value Ref Range Status   MRSA by PCR NEGATIVE NEGATIVE Final    Comment:        The GeneXpert MRSA Assay (FDA approved for NASAL specimens only), is one component of a comprehensive MRSA colonization surveillance program. It is not intended to diagnose MRSA infection nor to guide or monitor treatment for MRSA infections. Performed at [facility], [place] [address] [place], [place] [address]   Aerobic/Anaerobic Culture (surgical/deep wound)     Status: None (Preliminary result)   Collection Time: 06/12/18  1:24 PM  Result Value Ref Range Status   Specimen Description   Final    FOOT LEFT SOFT TISSUE Performed at [facility] [address], [place], [place] [address]    Special Requests   Final    NONE Performed at [facility], [place] [address], [place], [place] [address]    Gram Stain   Final    FEW WBC PRESENT, PREDOMINANTLY PMN MODERATE GRAM POSITIVE COCCI IN PAIRS AND CHAINS    Culture   Final    TOO YOUNG TO READ Performed at [facility], [place] [address], [place], [place] [address]    Report Status PENDING  Incomplete     Liver Function Tests: Recent Labs  Lab 06/10/18 1908 06/11/18 0442 06/12/18 0505  AST 17 13* 15  ALT 14 12 12   ALKPHOS 111 87 91  BILITOT 0.6 0.9 0.8  PROT 8.7* 7.0 7.0  ALBUMIN 3.7 3.0* 2.9*   No results for input(s): LIPASE, AMYLASE in the last 168 hours. No results for input(s): AMMONIA in  the last 168 hours.  Cardiac Enzymes: No results for input(s): CKTOTAL, CKMB, CKMBINDEX, TROPONINI in the last 168 hours. BNP (last 3 results) No results for input(s): BNP in the last 8760 hours.  ProBNP (last 3 results) No results for input(s): PROBNP in the last 8760 hours.    Studies: No results found.  Scheduled Meds: . aspirin EC  81 mg Oral Daily  . enoxaparin (LOVENOX) injection  40 mg Subcutaneous Daily  . insulin aspart  0-9 Units Subcutaneous Q4H  . insulin glargine  10 Units Subcutaneous QHS  . lisinopril  10 mg Oral Daily  . nicotine  21 mg Transdermal Daily  . nutrition supplement (JUVEN)  1 packet Oral BID BM      Time spent: 25 min  [facility] Pager [phone]. If 7PM-7AM, please contact night-coverage at [email or web address], Office  [phone]  password [email or web address]  06/14/2018, 10:25 AM  LOS: 4 days

## 2018-06-14 NOTE — Progress Notes (Signed)
A&Ox3, patient to be transferred to [place] at [facility], report called to nurse Caylisa at 2058, patient informed of new room and states he will notify his wife in the morning. Medical necessity form completed. Vss. Denies pain, cbg 176 this evening, lantus given, heplocks patent, all belongings packed up, will be transferred with crutches and walker boot

## 2018-06-15 ENCOUNTER — Inpatient Hospital Stay (HOSPITAL_COMMUNITY)
Admission: EM | Disposition: A | Discharge status: Home / Self Care | Payer: Self-pay | Source: Home / Self Care | Attending: Family Medicine

## 2018-06-15 ENCOUNTER — Encounter (HOSPITAL_COMMUNITY): Payer: Self-pay | Admitting: Vascular Surgery

## 2018-06-15 DIAGNOSIS — E440 Moderate protein-calorie malnutrition: Secondary | ICD-10-CM

## 2018-06-15 DIAGNOSIS — I998 Other disorder of circulatory system: Secondary | ICD-10-CM

## 2018-06-15 HISTORY — PX: PERIPHERAL VASCULAR ATHERECTOMY: CATH118256

## 2018-06-15 HISTORY — PX: PERIPHERAL VASCULAR BALLOON ANGIOPLASTY: CATH118281

## 2018-06-15 HISTORY — PX: ABDOMINAL AORTOGRAM W/LOWER EXTREMITY: CATH118223

## 2018-06-15 LAB — CULTURE, BLOOD (ROUTINE X 2)
Culture: NO GROWTH
Culture: NO GROWTH
Special Requests: ADEQUATE
Special Requests: ADEQUATE

## 2018-06-15 LAB — CREATININE, SERUM
Creatinine, Ser: 0.76 mg/dL (ref 0.61–1.24)
GFR calc Af Amer: >60 mL/min (ref >60)
GFR calc non Af Amer: >60 mL/min (ref >60)

## 2018-06-15 LAB — CBC
HCT: 42.6 % (ref 39.0–52.0)
Hemoglobin: 14.4 g/dL (ref 13.0–17.0)
MCH: 31 pg (ref 26.0–34.0)
MCHC: 33.8 g/dL (ref 30.0–36.0)
MCV: 91.6 fL (ref 78.0–100.0)
Platelets: 578 10*3/uL — ABNORMAL HIGH (ref 150–400)
RBC: 4.65 MIL/uL (ref 4.22–5.81)
RDW: 11.7 % (ref 11.5–15.5)
WBC: 9.4 10*3/uL (ref 4.0–10.5)

## 2018-06-15 LAB — GLUCOSE, CAPILLARY
Glucose-Capillary: 101 mg/dL — ABNORMAL HIGH (ref 70–99)
Glucose-Capillary: 88 mg/dL (ref 70–99)
Glucose-Capillary: 169 mg/dL — ABNORMAL HIGH (ref 70–99)
Glucose-Capillary: 182 mg/dL — ABNORMAL HIGH (ref 70–99)

## 2018-06-15 LAB — POCT ACTIVATED CLOTTING TIME: Activated Clotting Time: 235 seconds

## 2018-06-15 SURGERY — ABDOMINAL AORTOGRAM W/LOWER EXTREMITY
Anesthesia: LOCAL | Laterality: Left

## 2018-06-15 MED ORDER — SODIUM CHLORIDE 0.9 % IV SOLN: INTRAVENOUS | Status: AC

## 2018-06-15 MED ORDER — VERAPAMIL HCL 2.5 MG/ML IV SOLN
INTRAVENOUS | Status: AC
  Filled 2018-06-15: qty 2

## 2018-06-15 MED ORDER — FENTANYL CITRATE (PF) 100 MCG/2ML IJ SOLN
INTRAMUSCULAR | Status: AC
  Filled 2018-06-15: qty 2

## 2018-06-15 MED ORDER — HEPARIN (PORCINE) IN NACL 1000-0.9 UT/500ML-% IV SOLN
INTRAVENOUS | Status: AC
  Filled 2018-06-15: qty 1000

## 2018-06-15 MED ORDER — SODIUM CHLORIDE 0.9 % IV SOLN: 250.0000 mL | INTRAVENOUS | Status: DC | PRN

## 2018-06-15 MED ORDER — CLOPIDOGREL BISULFATE 75 MG PO TABS
75.0000 mg | ORAL_TABLET | Freq: Every day | ORAL | Status: DC
  Administered 2018-06-16: 11:00:00 75 mg via ORAL
  Filled 2018-06-15: qty 1

## 2018-06-15 MED ORDER — CLOPIDOGREL BISULFATE 300 MG PO TABS
ORAL_TABLET | ORAL | Status: DC | PRN
  Administered 2018-06-15: 300 mg via ORAL

## 2018-06-15 MED ORDER — CLOPIDOGREL BISULFATE 75 MG PO TABS: 300.0000 mg | ORAL_TABLET | Freq: Once | ORAL | Status: AC

## 2018-06-15 MED ORDER — HEPARIN SODIUM (PORCINE) 5000 UNIT/ML IJ SOLN: 5000.0000 [IU] | Freq: Three times a day (TID) | INTRAMUSCULAR | Status: DC

## 2018-06-15 MED ORDER — MIDAZOLAM HCL 2 MG/2ML IJ SOLN
INTRAMUSCULAR | Status: AC
  Filled 2018-06-15: qty 2

## 2018-06-15 MED ORDER — LIDOCAINE HCL (PF) 1 % IJ SOLN
INTRAMUSCULAR | Status: DC | PRN
  Administered 2018-06-15: 18 mL

## 2018-06-15 MED ORDER — IODIXANOL 320 MG/ML IV SOLN
INTRAVENOUS | Status: DC | PRN
  Administered 2018-06-15: 130 mL via INTRAVENOUS

## 2018-06-15 MED ORDER — HYDRALAZINE HCL 20 MG/ML IJ SOLN
INTRAMUSCULAR | Status: AC
  Filled 2018-06-15: qty 1

## 2018-06-15 MED ORDER — INSULIN ASPART 100 UNIT/ML ~~LOC~~ SOLN
0.0000 [IU] | Freq: Three times a day (TID) | SUBCUTANEOUS | Status: DC
  Administered 2018-06-15: 18:00:00 2 [IU] via SUBCUTANEOUS
  Administered 2018-06-16: 13:00:00 1 [IU] via SUBCUTANEOUS
  Administered 2018-06-16: 2 [IU] via SUBCUTANEOUS

## 2018-06-15 MED ORDER — FENTANYL CITRATE (PF) 100 MCG/2ML IJ SOLN
INTRAMUSCULAR | Status: DC | PRN
  Administered 2018-06-15 (×3): 50 ug via INTRAVENOUS

## 2018-06-15 MED ORDER — NITROGLYCERIN IN D5W 200-5 MCG/ML-% IV SOLN
INTRAVENOUS | Status: AC
  Filled 2018-06-15: qty 250

## 2018-06-15 MED ORDER — VERAPAMIL HCL 2.5 MG/ML IV SOLN
INTRA_ARTERIAL | Status: DC | PRN
  Administered 2018-06-15: 10:00:00 via INTRA_ARTERIAL

## 2018-06-15 MED ORDER — HYDRALAZINE HCL 20 MG/ML IJ SOLN
5.0000 mg | INTRAMUSCULAR | Status: AC | PRN
  Administered 2018-06-15 (×2): 5 mg via INTRAVENOUS

## 2018-06-15 MED ORDER — LABETALOL HCL 5 MG/ML IV SOLN: 10.0000 mg | INTRAVENOUS | Status: DC | PRN

## 2018-06-15 MED ORDER — ANGIOPLASTY BOOK
Freq: Once | Status: AC
  Administered 2018-06-15: 22:00:00
  Filled 2018-06-15: qty 1

## 2018-06-15 MED ORDER — ONDANSETRON HCL 4 MG/2ML IJ SOLN: 4.0000 mg | Freq: Four times a day (QID) | INTRAMUSCULAR | Status: DC | PRN

## 2018-06-15 MED ORDER — ACETAMINOPHEN 325 MG PO TABS: 650.0000 mg | ORAL_TABLET | ORAL | Status: DC | PRN

## 2018-06-15 MED ORDER — SODIUM CHLORIDE 0.9% FLUSH
3.0000 mL | Freq: Two times a day (BID) | INTRAVENOUS | Status: DC
  Administered 2018-06-15: 22:00:00 3 mL via INTRAVENOUS

## 2018-06-15 MED ORDER — LIDOCAINE HCL (PF) 1 % IJ SOLN
INTRAMUSCULAR | Status: AC
  Filled 2018-06-15: qty 30

## 2018-06-15 MED ORDER — HEPARIN SODIUM (PORCINE) 1000 UNIT/ML IJ SOLN
INTRAMUSCULAR | Status: DC | PRN
  Administered 2018-06-15: 8000 [IU] via INTRAVENOUS

## 2018-06-15 MED ORDER — SODIUM CHLORIDE 0.9% FLUSH: 3.0000 mL | INTRAVENOUS | Status: DC | PRN

## 2018-06-15 MED ORDER — OXYCODONE HCL 5 MG PO TABS: 5.0000 mg | ORAL_TABLET | ORAL | Status: DC | PRN

## 2018-06-15 MED ORDER — MIDAZOLAM HCL 2 MG/2ML IJ SOLN
INTRAMUSCULAR | Status: DC | PRN
  Administered 2018-06-15 (×2): 1 mg via INTRAVENOUS

## 2018-06-15 MED ORDER — SODIUM CHLORIDE 0.9 % IV SOLN
INTRAVENOUS | Status: DC
  Administered 2018-06-15: 09:00:00 via INTRAVENOUS

## 2018-06-15 SURGICAL SUPPLY — 24 items
BALLN ADMIRAL INPACT 5X250 (BALLOONS) ×3
BALLN STERLING OTW 5X220X150 (BALLOONS) ×3
BALLOON ADMIRAL INPACT 5X250 (BALLOONS) IMPLANT
BALLOON STERLING OTW 5X220X150 (BALLOONS) IMPLANT
BUR JETSTREAM XC 2.4/3.4 (BURR) IMPLANT
BURR JETSTREAM XC 2.4/3.4 (BURR) ×3
CATH OMNI FLUSH 5F 65CM (CATHETERS) ×1 IMPLANT
CATH QUICKCROSS SUPP .035X90CM (MICROCATHETER) ×1 IMPLANT
DEVICE CLOSURE MYNXGRIP 6/7F (Vascular Products) ×1 IMPLANT
DEVICE EMBOSHIELD NAV6 4.0-7.0 (FILTER) ×1 IMPLANT
DEVICE TORQUE .025-.038 (MISCELLANEOUS) ×1 IMPLANT
GUIDEWIRE ANGLED .035X260CM (WIRE) ×1 IMPLANT
KIT ENCORE 26 ADVANTAGE (KITS) ×1 IMPLANT
KIT MICROPUNCTURE NIT STIFF (SHEATH) ×1 IMPLANT
KIT PV (KITS) ×3 IMPLANT
SHEATH FLEXOR ANSEL 1 7F 45CM (SHEATH) ×1 IMPLANT
SHEATH PINNACLE 5F 10CM (SHEATH) ×1 IMPLANT
SHEATH PINNACLE 7F 10CM (SHEATH) ×1 IMPLANT
SHEATH PROBE COVER 6X72 (BAG) ×1 IMPLANT
SYR MEDRAD MARK V 150ML (SYRINGE) ×3 IMPLANT
TRANSDUCER W/STOPCOCK (MISCELLANEOUS) ×3 IMPLANT
TRAY PV CATH (CUSTOM PROCEDURE TRAY) ×3 IMPLANT
WIRE BAREWIRE WORK .014X315CM (WIRE) ×1 IMPLANT
WIRE BENTSON .035X145CM (WIRE) ×1 IMPLANT

## 2018-06-15 NOTE — Progress Notes (Signed)
   Doing well s/p lle revascularization with palpable pt pulse. [place] for discharge from vascular standpoint tomorrow with asa and plavix. Will arrange f/u in 4 weeks.   Shawntee Mainwaring C. Donzetta Matters, MD [facility] [place] Office: [phone] Pager: [phone]

## 2018-06-15 NOTE — Op Note (Signed)
    Patient name: Stanley Chaney MRN: [id number] DOB: 16-Apr-1961 Sex: male  06/15/2018 Pre-operative Diagnosis: Critical left lower extremity ischemia with wound Post-operative diagnosis:  Same Surgeon:  [name], MD Procedure Performed: 1.  Ultrasound-guided cannulation right common femoral artery 2.  Aortogram bilateral lower extremity runoff 3.  Rotational atherectomy left SFA with jetstream 4.  Drug-coated balloon angioplasty left SFA with 5 x 250 in.pact Admiral 5.  Minx device closure right common femoral artery 6.  Moderate sedation with fentanyl Versed for 78 minutes   Indications: 57 year old male recently admitted with left foot infection.  He is undergone drainage procedure.  He is now indicated for angiogram possible intervention left lower extremity.  Findings: The aorta and iliac segments are free of flow-limiting stenosis.  On the left side which is the site of concern there is a 50% stenosis at the takeoff of the SFA followed by subtotal occlusive disease for a 15 cm segment in the mid SFA.  This was treated in a true luminal plane at completion there was no dissection no further residual stenosis were previously it was greater than 90% at least.  Runoff is via the posterior tibial is the dominant vessel but there appears to be an anterior tibial and peroneal artery patent as well.  On the right side the SFA is occluded after the takeoff reconstitutes above the popliteal tibial vessels also appear to be dominant via the posterior tibial although the contrast was incompletely time for this.   Procedure:  The patient was identified in the holding area and taken to room 8.  The patient was then placed supine on the table and prepped and draped in the usual sterile fashion.  A time out was called.  Ultrasound was used to evaluate the right common femoral artery which is noted to be patent although there was some posterior plaque.  The area was anesthetized with 1% lidocaine  cannulated under direct visualization with ultrasound with micropuncture needle followed by wire and sheath.  An image was saved to the permanent record.  We then placed a Bentson wire followed by 5 [place] sheath.  Omni Flush catheter was placed to the level of L1 and aortogram with bilateral lower extremity runoff was performed.  With the above findings we then crossed the bifurcation with Bentson and Omni placed along 7 French sheath and the patient was heparinized and ACT returned to 30.  We used a Glidewire quick cross catheter to cross all the way intraluminally through the occluded and stenotic areas.  We confirmed intraluminal access.  We exchanged for a bare wire and placed a 4-7 Nav 6 filter.  Jetstream was performed to the SFA with 2 passes blades down and 1 past blades up into separate sections.  Balloon Angioplasty with 5 mm balloon was then performed demonstrated no dissection or residual stenosis were previously there was subtotal occlusion for a long segment.  We then brought a drug-coated balloon to the table inflated this at nominal pressure for 8 minutes.  Completion demonstrated no residual dissection or stenosis.  We then retrieved the filter demonstrated good flow through that segment.  We exchanged for a short 7 French sheath and deployed a minx device.  He tolerated procedure without immediate complication.  Contrast: 130cc   Coury Grieger C. Donzetta Matters, MD [facility] Office: [phone] Pager: [phone]

## 2018-06-15 NOTE — Progress Notes (Signed)
[facility]  PROGRESS NOTE  Stanley Chaney GXQ:[id number] DOB: [birth year] DOA: 06/10/2018 PCP: Patient, No Pcp Per   Brief HPI:   57 year old male with no significant medical problems came to hospital with puncture wound to left foot followed by swelling.  Patient has not seen a physician for past 21 years.  Patient says that one week ago he stepped on a glass while visiting his son.  After that he started swelling.   Subjective  -just back from Arteriogram   Assessment/Plan:   1. Diabetic foot abscess  -New diagnosis of diabetes, had not seen a physician for 20+ years -Treated with IV ceftriaxone and Flagyl  -Orthopedics consulted, underwent excisional debridement of left foot on 10/4  -MRI did not show evidence of osteomyelitis -Wound culture grew group B strep -Transition to oral antibiotics for 7 more days at discharge  2.  Peripheral arterial disease -Managed pulses, ABIs done in the hospital showed moderate peripheral arterial disease, ABI right 0.59, ABI left 0.73 - Vascular surgery was consulted - Arteriogram today -Continue aspirin 81 mg p.o. daily  3. Diabetes mellitus type II-new diagnosis -hemoglobin A1c is 10.4.  -Continue sliding scale insulin with NovoLog -Uninsured, discharged on metformin and sulfonylureas at discharge  4. Transverse fracture of the proximal metaphysis of proximal phalanx-follow-up Dr. [name] as outpatient in 10 days.  5. Hypertension-blood pressure was elevated, started on lisinopril 10 mg p.o. daily.  Blood pressure is adequately controlled  DVT prophylaxis: Lovenox  Code Status: Full code  Family Communication: No family at bedside  Disposition Plan: Home pending above workup  Consultants: Orthopedics Vascular surgery  Procedures:  -10/4 excisional debridement of skin subcutaneous tissue muscle fascia at the site of complex infection of left foot  Arteriogram left leg Dr. Donzetta Matters   CBG: Recent Labs  Lab  06/14/18 0742 06/14/18 1158 06/14/18 1729 06/14/18 2235 06/15/18 0736  GLUCAP 148* 149* 122* 172* 101*    CBC: Recent Labs  Lab 06/10/18 1908 06/11/18 0442 06/12/18 0505 06/13/18 0438  WBC 12.9* 11.1* 8.9 9.4  NEUTROABS 9.3*  --   --   --   HGB 15.9 13.7 13.8 13.4  HCT 45.4 40.3 40.3 39.3  MCV 91.0 91.4 91.2 90.3  PLT 382 365 398 479*    Basic Metabolic Panel: Recent Labs  Lab 06/10/18 1908 06/11/18 0442 06/12/18 0505 06/13/18 0438  NA 137 142 141 140  K 4.6 4.0 5.0 3.6  CL 96* 103 102 100  CO2 26 28 26 29   GLUCOSE 250* 155* 149* 113*  BUN 13 9 11 9   CREATININE 0.90 0.77 0.83 0.69  CALCIUM 10.0 9.0 9.0 8.8*  MG  --  2.1  --   --   PHOS  --  3.6  --   --      Antibiotics:   Anti-infectives (From admission, onward)   Start     Dose/Rate Route Frequency Ordered Stop   06/14/18 2200  [MAR Hold]  amoxicillin-clavulanate (AUGMENTIN) 875-125 MG per tablet 1 tablet     (MAR Hold since Mon 06/15/2018 at 0849. Reason: Transfer to a Procedural area.)   1 tablet Oral Every 12 hours 06/14/18 1027     06/12/18 0600  ceFAZolin (ANCEF) IVPB 2g/100 mL premix     2 g 200 mL/hr over 30 Minutes Intravenous On call to O.R. 06/11/18 2239 06/12/18 1320   06/11/18 0500  metroNIDAZOLE (FLAGYL) IVPB 500 mg  Status:  Discontinued     500 mg 100 mL/hr over 60 Minutes  Intravenous Every 8 hours 06/10/18 2352 06/14/18 1026   06/11/18 0400  cefTRIAXone (ROCEPHIN) 2 g in sodium chloride 0.9 % 100 mL IVPB  Status:  Discontinued     2 g 200 mL/hr over 30 Minutes Intravenous Daily 06/10/18 2352 06/14/18 1026   06/10/18 2115  piperacillin-tazobactam (ZOSYN) IVPB 3.375 g     3.375 g 100 mL/hr over 30 Minutes Intravenous  Once 06/10/18 2109 06/10/18 2218   06/10/18 2115  vancomycin (VANCOCIN) 1,500 mg in sodium chloride 0.9 % 500 mL IVPB     1,500 mg 250 mL/hr over 120 Minutes Intravenous  Once 06/10/18 2112 06/11/18 0038       Objective   Vitals:   06/15/18 1235 06/15/18 1255  06/15/18 1305 06/15/18 1335  BP: (!) 178/83 (!) 185/63 (!) 158/52 (!) 139/58  Pulse: (!) 51 (!) 54 (!) 54 (!) 49  Resp: 16 14 13 13   Temp:      TempSrc:      SpO2: 100% 99% 100% 99%  Weight:      Height:        Intake/Output Summary (Last 24 hours) at 06/15/2018 1353 Last data filed at 06/14/2018 1405 Gross per 24 hour  Intake 0 ml  Output -  Net 0 ml   Filed Weights   06/11/18 0011 06/12/18 1155  Weight: 69.1 kg 72.6 kg     Physical Examination:  Gen: Awake, Alert, Oriented X 3,  HEENT: PERRLA, Neck supple, no JVD Lungs: Good air movement bilaterally, CTAB CVS: RRR,No Gallops,Rubs or new Murmurs Abd: soft, Non tender, non distended, BS present Extremities: L foot with dressing Skin: as above   Data Reviewed: I have personally reviewed following labs and imaging studies   Recent Results (from the past 240 hour(s))  Blood culture (routine x 2)     Status: None   Collection Time: 06/10/18  9:10 PM  Result Value Ref Range Status   Specimen Description   Final    BLOOD RIGHT FOREARM Performed at [facility], [address], [place], [place] [address]    Special Requests   Final    BOTTLES DRAWN AEROBIC AND ANAEROBIC Blood Culture adequate volume Performed at [facility] [address] [place], [place] [address]    Culture   Final    NO GROWTH 5 DAYS Performed at [facility], [place] [address], [place], [place] [address]    Report Status 06/15/2018 FINAL  Final  Blood culture (routine x 2)     Status: None   Collection Time: 06/10/18  9:15 PM  Result Value Ref Range Status   Specimen Description   Final    BLOOD LEFT ANTECUBITAL Performed at [facility] [address] [place], [place] [address]    Special Requests   Final    BOTTLES DRAWN AEROBIC AND ANAEROBIC Blood Culture adequate volume Performed at [facility] [address], [place], [place] [address]    Culture   Final    NO  GROWTH 5 DAYS Performed at [facility], [place] [address], [place], [place] [address]    Report Status 06/15/2018 FINAL  Final  MRSA PCR Screening     Status: None   Collection Time: 06/11/18 12:30 AM  Result Value Ref Range Status   MRSA by PCR NEGATIVE NEGATIVE Final    Comment:        The GeneXpert MRSA Assay (FDA approved for NASAL specimens only), is  one component of a comprehensive MRSA colonization surveillance program. It is not intended to diagnose MRSA infection nor to guide or monitor treatment for MRSA infections. Performed at [facility], [place] [address], [place], [place] [address]   Aerobic/Anaerobic Culture (surgical/deep wound)     Status: None (Preliminary result)   Collection Time: 06/12/18  1:24 PM  Result Value Ref Range Status   Specimen Description   Final    FOOT LEFT SOFT TISSUE Performed at [facility] [address], [place], [place] [address]    Special Requests   Final    NONE Performed at [facility], [place] [address] [place], [place] [address]    Gram Stain   Final    FEW WBC PRESENT, PREDOMINANTLY PMN MODERATE GRAM POSITIVE COCCI IN PAIRS AND CHAINS Performed at [facility], [place] [address], [place],  [address]    Culture   Final    MODERATE GROUP B STREP(S.AGALACTIAE)ISOLATED TESTING AGAINST S. AGALACTIAE NOT ROUTINELY PERFORMED DUE TO PREDICTABILITY OF AMP/PEN/VAN SUSCEPTIBILITY. NO ANAEROBES ISOLATED; CULTURE IN PROGRESS FOR 5 DAYS    Report Status PENDING  Incomplete     Liver Function Tests: Recent Labs  Lab 06/10/18 1908 06/11/18 0442 06/12/18 0505  AST 17 13* 15  ALT 14 12 12   ALKPHOS 111 87 91  BILITOT 0.6 0.9 0.8  PROT 8.7* 7.0 7.0  ALBUMIN 3.7 3.0* 2.9*   No results for input(s): LIPASE, AMYLASE in the last 168 hours. No results for input(s): AMMONIA in the last 168 hours.  Cardiac Enzymes: No results for input(s): CKTOTAL, CKMB, CKMBINDEX,  TROPONINI in the last 168 hours. BNP (last 3 results) No results for input(s): BNP in the last 8760 hours.  ProBNP (last 3 results) No results for input(s): PROBNP in the last 8760 hours.    Studies: No results found.  Scheduled Meds: . [MAR Hold] amoxicillin-clavulanate  1 tablet Oral Q12H  . [MAR Hold] aspirin EC  81 mg Oral Daily  . [MAR Hold] enoxaparin (LOVENOX) injection  40 mg Subcutaneous Daily  . [MAR Hold] insulin aspart  0-9 Units Subcutaneous TID WC  . [MAR Hold] insulin glargine  10 Units Subcutaneous QHS  . [MAR Hold] lisinopril  10 mg Oral Daily  . [MAR Hold] nicotine  21 mg Transdermal Daily  . [MAR Hold] nutrition supplement (JUVEN)  1 packet Oral BID BM      Time spent: 25 min  [facility] Page via [email or web address] If 7PM-7AM, please contact night-coverage Office  [phone]  password [facility]  06/15/2018, 1:53 PM  LOS: 5 days

## 2018-06-15 NOTE — Progress Notes (Signed)
  Progress Note    06/15/2018 8:57 AM Day of Surgery  Subjective: transferred to 5N overnight without issue  Vitals:   06/15/18 0009 06/15/18 0652  BP: (!) 144/73 (!) 142/70  Pulse: (!) 50 (!) 47  Resp:    Temp: 98.4 F (36.9 C) 98.5 F (36.9 C)  SpO2: 100% 98%    Physical Exam: aaox3 Non labored respirations Palpable femoral pulses bilaterally  CBC    Component Value Date/Time   WBC 9.4 06/13/2018 0438   RBC 4.35 06/13/2018 0438   HGB 13.4 06/13/2018 0438   HCT 39.3 06/13/2018 0438   PLT 479 (H) 06/13/2018 0438   MCV 90.3 06/13/2018 0438   MCH 30.8 06/13/2018 0438   MCHC 34.1 06/13/2018 0438   RDW 11.9 06/13/2018 0438   LYMPHSABS 2.4 06/10/2018 1908   MONOABS 1.1 (H) 06/10/2018 1908   EOSABS 0.1 06/10/2018 1908   BASOSABS 0.1 06/10/2018 1908    BMET    Component Value Date/Time   NA 140 06/13/2018 0438   K 3.6 06/13/2018 0438   CL 100 06/13/2018 0438   CO2 29 06/13/2018 0438   GLUCOSE 113 (H) 06/13/2018 0438   BUN 9 06/13/2018 0438   CREATININE 0.69 06/13/2018 0438   CALCIUM 8.8 (L) 06/13/2018 0438   GFRNONAA >60 06/13/2018 0438   GFRAA >60 06/13/2018 0438    INR    Component Value Date/Time   INR 1.06 06/11/2018 0013     Intake/Output Summary (Last 24 hours) at 06/15/2018 0857 Last data filed at 06/14/2018 1405 Gross per 24 hour  Intake 240 ml  Output -  Net 240 ml     Assessment:  57 y.o. male is s/p I+d of left foot with abi 0.7 on effected side  Plan: pv lab todyay for angiogram possible intervention on left.  Huong Luthi C. Donzetta Matters, MD [facility] Office: [phone] Pager: [phone]  06/15/2018 8:57 AM

## 2018-06-16 LAB — CBC
HCT: 41.8 % (ref 39.0–52.0)
Hemoglobin: 14.1 g/dL (ref 13.0–17.0)
MCH: 30.5 pg (ref 26.0–34.0)
MCHC: 33.7 g/dL (ref 30.0–36.0)
MCV: 90.5 fL (ref 80.0–100.0)
Platelets: 585 10*3/uL — ABNORMAL HIGH (ref 150–400)
RBC: 4.62 MIL/uL (ref 4.22–5.81)
RDW: 11.6 % (ref 11.5–15.5)
WBC: 8.2 10*3/uL (ref 4.0–10.5)

## 2018-06-16 LAB — BASIC METABOLIC PANEL
Anion gap: 9 (ref 5–15)
BUN: 6 mg/dL (ref 6–20)
CO2: 29 mmol/L (ref 22–32)
Calcium: 8.9 mg/dL (ref 8.9–10.3)
Chloride: 102 mmol/L (ref 98–111)
Creatinine, Ser: 0.83 mg/dL (ref 0.61–1.24)
GFR calc Af Amer: >60 mL/min (ref >60)
GFR calc non Af Amer: >60 mL/min (ref >60)
Glucose, Bld: 119 mg/dL — ABNORMAL HIGH (ref 70–99)
Potassium: 4.3 mmol/L (ref 3.5–5.1)
Sodium: 140 mmol/L (ref 135–145)

## 2018-06-16 LAB — GLUCOSE, CAPILLARY
Glucose-Capillary: 177 mg/dL — ABNORMAL HIGH (ref 70–99)
Glucose-Capillary: 145 mg/dL — ABNORMAL HIGH (ref 70–99)

## 2018-06-16 MED ORDER — CLOPIDOGREL BISULFATE 75 MG PO TABS: 75.0000 mg | ORAL_TABLET | Freq: Every day | ORAL | 0 refills | Status: DC

## 2018-06-16 MED ORDER — AMOXICILLIN-POT CLAVULANATE 875-125 MG PO TABS: 1.0000 | ORAL_TABLET | Freq: Two times a day (BID) | ORAL | 0 refills | Status: AC

## 2018-06-16 MED ORDER — METFORMIN HCL 500 MG PO TABS: 500.0000 mg | ORAL_TABLET | Freq: Two times a day (BID) | ORAL | 0 refills | Status: DC

## 2018-06-16 MED ORDER — NICOTINE 14 MG/24HR TD PT24: 14.0000 mg | MEDICATED_PATCH | Freq: Every day | TRANSDERMAL | 0 refills | Status: DC

## 2018-06-16 MED ORDER — BLOOD GLUCOSE METER KIT: PACK | 0 refills | Status: AC

## 2018-06-16 MED ORDER — GLIPIZIDE 5 MG PO TABS: 5.0000 mg | ORAL_TABLET | Freq: Every day | ORAL | 0 refills | Status: DC

## 2018-06-16 MED ORDER — LISINOPRIL 10 MG PO TABS: 10.0000 mg | ORAL_TABLET | Freq: Every day | ORAL | 0 refills | Status: DC

## 2018-06-16 MED ORDER — HYDROCODONE-ACETAMINOPHEN 5-325 MG PO TABS: 1.0000 | ORAL_TABLET | Freq: Four times a day (QID) | ORAL | 0 refills | Status: DC | PRN

## 2018-06-16 MED ORDER — ASPIRIN 81 MG PO TBEC: 81.0000 mg | DELAYED_RELEASE_TABLET | Freq: Every day | ORAL | 0 refills | Status: DC

## 2018-06-16 MED FILL — ASPIRIN LOW DOSE 81 MG TBEC: 81 | 30 days supply | Qty: 30 | Fill #0

## 2018-06-16 MED FILL — LISINOPRIL 10 MG TABS: 10 | 30 days supply | Qty: 30 | Fill #0

## 2018-06-16 MED FILL — glipiZIDE 5 MG TABS: 5 | 30 days supply | Qty: 30 | Fill #0

## 2018-06-16 MED FILL — AMOX-CLAV 875-125 MG TABLET: 875-125 | 6 days supply | Qty: 12 | Fill #0

## 2018-06-16 MED FILL — metFORMIN HCL 500 MG TABS: 500 | 30 days supply | Qty: 60 | Fill #0

## 2018-06-16 MED FILL — CLOPIDOGREL 75 MG TABLET: 75 | 30 days supply | Qty: 30 | Fill #0

## 2018-06-16 MED FILL — Heparin Sod (Porcine)-NaCl IV Soln 1000 Unit/500ML-0.9%: INTRAVENOUS | Qty: 1000 | Status: AC

## 2018-06-16 NOTE — Progress Notes (Signed)
  Progress Note    06/16/2018 10:13 AM 1 Day Post-Op  Subjective: Feeling fine today  Vitals:   06/16/18 0401 06/16/18 0809  BP: (!) 165/65 (!) 98/46  Pulse: 62   Resp: (!) 8   Temp: 97.8 F (36.6 C) 97.7 F (36.5 C)  SpO2: 100%     Physical Exam: Awake alert oriented Nonlabored respirations Right groin without hematoma Strong posterior tibial signal and a palpable popliteal pulse on the left  CBC    Component Value Date/Time   WBC 8.2 06/16/2018 0344   RBC 4.62 06/16/2018 0344   HGB 14.1 06/16/2018 0344   HCT 41.8 06/16/2018 0344   PLT 585 (H) 06/16/2018 0344   MCV 90.5 06/16/2018 0344   MCH 30.5 06/16/2018 0344   MCHC 33.7 06/16/2018 0344   RDW 11.6 06/16/2018 0344   LYMPHSABS 2.4 06/10/2018 1908   MONOABS 1.1 (H) 06/10/2018 1908   EOSABS 0.1 06/10/2018 1908   BASOSABS 0.1 06/10/2018 1908    BMET    Component Value Date/Time   NA 140 06/16/2018 0344   K 4.3 06/16/2018 0344   CL 102 06/16/2018 0344   CO2 29 06/16/2018 0344   GLUCOSE 119 (H) 06/16/2018 0344   BUN 6 06/16/2018 0344   CREATININE 0.83 06/16/2018 0344   CALCIUM 8.9 06/16/2018 0344   GFRNONAA >60 06/16/2018 0344   GFRAA >60 06/16/2018 0344    INR    Component Value Date/Time   INR 1.06 06/11/2018 0013     Intake/Output Summary (Last 24 hours) at 06/16/2018 1013 Last data filed at 06/16/2018 1941 Gross per 24 hour  Intake 840 ml  Output 1650 ml  Net -810 ml     Assessment:  57 y.o. male is s/p left lower extremity revascularization for foot wound  Plan: Will need aspirin/Plavix Follow-up in 4 weeks with left lower extremity duplex and ABIs. Okay for discharge from vascular standpoint   Stanley Chaney C. Donzetta Matters, MD [facility] Office: [phone] Pager: [phone]  06/16/2018 10:13 AM

## 2018-06-16 NOTE — Care Management Note (Signed)
Case Management Note  Patient Details  Name: Stanley Chaney MRN: [id number] Date of Birth: 13-Aug-1961  Subjective/Objective:   From home with son, s/p stent, will be on plavix, has new DM also, will be on oral DM meds.  NCM assisted patient with PCP at [facility] 11/8 at 8:50 for follow up and he can utilize [facility] for meds if needed.   NCM gave patient Match Letter to ast with meds today at dc.  He can go to [facility], or [facility] can get the meds.  No other needs.                 Action/Plan: DC home when ready.  Expected Discharge Date:                  Expected Discharge Plan:  Home/Self Care  In-House Referral:     Discharge planning Services  Follow-up appt scheduled, [facility], [facility]  Post Acute Care Choice:    Choice offered to:     DME Arranged:    DME Agency:     HH Arranged:    HH Agency:     Status of Service:  Completed, signed off  If discussed at [employer], dates discussed:    Additional Comments:  Zenon Mayo, RN 06/16/2018, 10:01 AM

## 2018-06-16 NOTE — Progress Notes (Signed)
Inpatient Diabetes Program Recommendations  AACE/ADA: New Consensus Statement on Inpatient Glycemic Control (2015)  Target Ranges:  Prepandial:   less than 140 mg/dL      Peak postprandial:   less than 180 mg/dL (1-2 hours)      Critically ill patients:  140 - 180 mg/dL   Spoke with patient. Patient had many questions regarding diet education. Discussed CBG checks and for patient to check his glucose BID. Informed patient he will get a prescription for the meter and to pick it up at the pharmacy.  Discussed with patient staying active. Patient says he has a large dog to walk.  Patient is waiting for his wife for discharge. Patient does not have any questions at this time. Informed him to follow up at the clinic on 11/8 when his appointment is scheduled.  Thanks,  Tama Headings RN, MSN, BC-ADM Inpatient Diabetes Coordinator Team Pager [phone] (8a-5p)

## 2018-06-16 NOTE — Progress Notes (Signed)
Subjective: 4 Days Post-Op Procedure(s) (LRB): Excisional Debridement left foot (Left) Patient reports pain as mild.  Had vascular procedure yesterday.  Without complaints.  Objective: Vital signs in last 24 hours: Temp:  [97.8 F (36.6 C)-98.8 F (37.1 C)] 97.8 F (36.6 C) (10/08 0401) Pulse Rate:  [0-69] 62 (10/08 0401) Resp:  [6-48] 8 (10/08 0401) BP: (117-224)/(52-87) 165/65 (10/08 0401) SpO2:  [36 %-100 %] 100 % (10/08 0401) Weight:  [72 kg] 72 kg (10/08 0401)  Intake/Output from previous day: 10/07 0701 - 10/08 0700 In: 360 [P.O.:360] Out: 1650 [Urine:1650] Intake/Output this shift: No intake/output data recorded.  Recent Labs    06/15/18 1634 06/16/18 0344  HGB 14.4 14.1   Recent Labs    06/15/18 1634 06/16/18 0344  WBC 9.4 8.2  RBC 4.65 4.62  HCT 42.6 41.8  PLT 578* 585*   Recent Labs    06/15/18 1634 06/16/18 0344  NA  --  140  K  --  4.3  CL  --  102  CO2  --  29  BUN  --  6  CREATININE 0.76 0.83  GLUCOSE  --  119*  CALCIUM  --  8.9   No results for input(s): LABPT, INR in the last 72 hours. Left foot exam: Foot is warm.  Probable PT pulse. Plantar foot wound is benign.  Penrose drain intact.  Minimal redness.  Only very mild swelling.  Minimal drainage if any.    Assessment/Plan: 4 Days Post-Op Procedure(s) (LRB): Excisional Debridement left foot (Left) S/P I&D Plan: Dressing changed.  Penrose drain pulled. May be up crutches wearing a wooden soled shoe.  May need to use crutches weightbearing as tolerated on left lower extremity. I asked the patient to change the dressing every 2 days.  He will need to keep it dry.  He will follow-up with Dr. [name] in the office in 1 week. Okay from orthopedic viewpoint to discharge home on oral antibiotics at any time.     Erlene Senters 06/16/2018, 7:15 AM

## 2018-06-17 LAB — AEROBIC/ANAEROBIC CULTURE W GRAM STAIN (SURGICAL/DEEP WOUND)

## 2018-06-17 LAB — AEROBIC/ANAEROBIC CULTURE (SURGICAL/DEEP WOUND)

## 2018-06-19 ENCOUNTER — Telehealth: Payer: Self-pay | Admitting: Vascular Surgery

## 2018-06-19 NOTE — Telephone Encounter (Signed)
sch appt spk to pt mld ltr 07/17/18 9am ABI 10am LE art 11am f/u MD

## 2018-06-24 NOTE — Discharge Summary (Addendum)
Physician Discharge Summary  Stanley Chaney ZOX:[id number] DOB: 1960/10/21 DOA: 06/10/2018  PCP: Patient, No Pcp Per  Admit date: 06/10/2018 Discharge date: 06/16/2018  Time spent: 45 minutes  Recommendations for Outpatient Follow-up:  [facility] 11/6 Ortho Dr.John Chaney in 1 week Vascular Dr.[name] in 3weeks   Discharge Diagnoses:  Active Problems:   Diabetic foot abscess   PAD   Diabetes mellitus ([place])   Hyperglycemia   Cellulitis   Malnutrition of moderate degree   Discharge Condition: stable  Diet recommendation: Diabetic  Filed Weights   06/11/18 0011 06/12/18 1155 06/16/18 0401  Weight: 69.1 kg 72.6 kg 72 kg    History of present illness:  57 year old male with no significant medical problems came to hospital with puncture wound to left foot followed by swelling.  Patient has not seen a physician for past 21 years.  Patient says that one week ago he stepped on a glass while visiting his son.  After that he started swelling.  Hospital Course:  1. Diabetic foot abscess  -New diagnosis of diabetes, had not seen a physician for 20+ years -Treated with IV ceftriaxone and Flagyl  -Orthopedics consulted, underwent excisional debridement of left foot on 10/4  -MRI did not show evidence of osteomyelitis -Wound culture grew group B strep -Transitioned to oral antibiotics for 7 more days at discharge -FU with Dr.Graves in 1 week for foot recheck, educated about dressing changes, declined HHRN  2.  Peripheral arterial disease -Diminished pulses, ABIs donein the hospitalshowed moderate peripheral arterial disease, ABI right 0.59, ABI left 0.73 -Vascular surgery was consulted -underwent Arteriogram yesterday, underwent Athrectomy and balloon angioplasty of SFA -discharged home on ASA/Plavix, advised smoking cessation  -FU with Dr.[name] in 3weeks  3. Diabetes mellitustype II-new diagnosis -hemoglobin A1c is 10.4.  -Continue sliding scale insulin  with NovoLog -Uninsured, discharged on metformin and sulfonylureas at discharge -made FU with new PCP at [facility]  4. Transverse fracture of the proximal metaphysis of proximal phalanx-follow-up Dr. Berenice Chaney as outpatient in 10 days.  5. Hypertension-blood pressurewas elevated, started on lisinopril 10 mg p.o. daily. Blood pressure is adequately controlled  6. Sepsis was ruled out  Consultants: Orthopedics Vascular surgery  Procedures:  -10/4 excisional debridement of skin subcutaneous tissue muscle fascia at the site of complex infection of left foot-Dr.John Chaney   -06/15/2018 Pre-operative Diagnosis: Critical left lower extremity ischemia with wound Post-operative diagnosis:  Same Surgeon:  Stanley Quan C. Donzetta Matters, MD Procedure Performed: 1.  Ultrasound-guided cannulation right common femoral artery 2.  Aortogram bilateral lower extremity runoff 3.  Rotational atherectomy left SFA with jetstream 4.  Drug-coated balloon angioplasty left SFA with 5 x 250 in.pact Admiral 5.  Minx device closure right common femoral artery  6.  Moderate sedation with fentanyl Versed for 78 minutes   Discharge Exam: Vitals:   06/16/18 0809 06/16/18 1226  BP: (!) 98/46 (!) 169/70  Pulse:  62  Resp:  15  Temp: 97.7 F (36.5 C) 97.6 F (36.4 C)  SpO2:  99%    General: AAOx3 Cardiovascular: S1S2/RRR Respiratory: CTAB  Discharge Instructions   Discharge Instructions    Diet - low sodium heart healthy   Complete by:  As directed    Diet Carb Modified   Complete by:  As directed    Increase activity slowly   Complete by:  As directed      Allergies as of 06/16/2018   No Known Allergies     Medication List    TAKE these  medications   acetaminophen 500 MG tablet Commonly known as:  TYLENOL Take 500 mg by mouth every 6 (six) hours as needed.   aspirin 81 MG EC tablet Take 1 tablet (81 mg total) by mouth daily.   blood glucose meter kit and supplies Dispense based on  patient and insurance preference. Use up to four times daily as directed. (FOR ICD-10 E10.9, E11.9).   clopidogrel 75 MG tablet Commonly known as:  PLAVIX Take 1 tablet (75 mg total) by mouth daily with breakfast.   glipiZIDE 5 MG tablet Commonly known as:  GLUCOTROL Take 1 tablet (5 mg total) by mouth daily before breakfast.   lisinopril 10 MG tablet Commonly known as:  PRINIVIL,ZESTRIL Take 1 tablet (10 mg total) by mouth daily.   metFORMIN 500 MG tablet Commonly known as:  GLUCOPHAGE Take 1 tablet (500 mg total) by mouth 2 (two) times daily with a meal. Notes to patient:  HOLD FOR 48 HRS AFTER PROCEDURE   nicotine 14 mg/24hr patch Commonly known as:  NICODERM CQ - dosed in mg/24 hours Place 1 patch (14 mg total) onto the skin daily.     ASK your doctor about these medications   amoxicillin-clavulanate 875-125 MG tablet Commonly known as:  AUGMENTIN Take 1 tablet by mouth every 12 (twelve) hours for 6 days. Ask about: Should I take this medication?      No Known Allergies Follow-up Information    [facility] Follow up on 07/15/2018.   Why:  8:50 for hospital follow up Contact information: [place] [phone] [place] Follow up.   Why:  you can use the pharmacy here for medication assitance Contact information: [place] [address] [phone]       Stanley Leitz, MD. Schedule an appointment as soon as possible for a visit in 1 week.   Specialty:  Orthopedic Surgery Contact information: [place] [address] [phone]        Stanley Sandy, MD. Go in 3 week(s).   Specialties:  Vascular Surgery, Cardiology Contact information: [address] [place] [address] [phone]            The results of significant diagnostics from this hospitalization  (including imaging, microbiology, ancillary and laboratory) are listed below for reference.    Significant Diagnostic Studies: Dg Chest 2 View  Result Date: 06/11/2018 CLINICAL DATA:  Abnormal lung sounds. EXAM: CHEST - 2 VIEW COMPARISON:  None. FINDINGS: The lungs are clear without focal pneumonia, edema, pneumothorax or pleural effusion. The cardiopericardial silhouette is within normal limits for size. The visualized bony structures of the thorax are intact. IMPRESSION: No active cardiopulmonary disease. Electronically Signed   By: Misty Stanley M.D.   On: 06/11/2018 08:42   Mr Foot Left W Wo Contrast  Result Date: 06/12/2018 CLINICAL DATA:  Foot swelling and diabetes. Stepped on glass 10 days ago. Discoloration. EXAM: MRI OF THE LEFT FOREFOOT WITHOUT AND WITH CONTRAST TECHNIQUE: Multiplanar, multisequence MR imaging of the left forefoot was performed both before and after administration of intravenous contrast. Imaging was performed from about the level of the Chopart joint to the proximal phalanges. The distal toes and much of the hindfoot was excluded. CONTRAST:  6 cc Gadavist COMPARISON:  None. FINDINGS: Bones/Joint/Cartilage Transverse fracture of the proximal metaphysis proximal phalanx of the small toe, as shown on conventional radiographs of  06/10/2018, with associated edema and enhancement in the proximal phalanx as shown for example on image 24/7. There is a small erosion or geode along the medial head of the first metatarsal. No findings of osteomyelitis in the imaged portion of the foot. Ligaments Lisfranc ligament intact. Muscles and Tendons There is abnormal thickening of the medial band of the plantar fascia proximally irregularity and likely some mild partial tearing or laceration of the medial band plantar fascia below the cuneiform is a and about at the level of the Lisfranc joint. In this vicinity there is a 3.0 by 0.6 by 3.3 cm (volume = 3 cm^3) nonenhancing fluid collection along the  superior margin of the irregular medial band of the plantar fascia suspicious for abscess., but with only mild enhancement along its margins. In addition, there is a small amount of fluid tracking within along the medial margin of the flexor digitorum brevis muscle for example on images 44 through 37 of series 4. This has some adjacent infiltrative inflammatory stranding is suspicious for phlegmon and potentially incipient abscess, outlining the fascia margin and measuring about 1.4 by 0.9 by 2.3 cm (volume = 2 cm^3). There is low-level edema in the plantar musculature of the foot with some mild enhancement particularly in the flexor digitorum brevis. Edema tracks along some of the fascia planes in the plantar foot, for example between the proximal half of the first metatarsal and adjacent flexor hallucis brevis muscle. Soft tissues Subcutaneous edema noted in the soft tissues plantar to the first MTP joint. On the lateral radiograph there is a tiny density in this vicinity which could conceivably be a foreign body, but no foreign body is observed on MRI. IMPRESSION: 1. Irregularity and partial disruption of the medial band of the plantar fascia below the Lisfranc joint, with overlying superficial 3 cubic cm of fluid signal intensity suspicious for early abscess. 2. Smaller phlegmon or incipient abscess slightly further proximally along the medial margin of the flexor digitorum brevis muscle. 3. Focal subcutaneous edema and enhancement plantar to the first MTP joint. Although there is some faint calcification or similar density in this vicinity on the recent radiographs, I do not see a definite foreign body on the MRI images. 4. Transverse fracture the proximal metaphysis of the proximal phalanx small toe. I discussed the findings along the medial band of the plantar fascia by telephone at the time of interpretation on 06/12/2018 at 9:46 am to Dr. Dorna Chaney , who verbally acknowledged these results. Electronically  Signed   By: Van Clines M.D.   On: 06/12/2018 10:16   Dg Foot Complete Left  Result Date: 06/10/2018 CLINICAL DATA:  Plantar foot wound, swelling EXAM: LEFT FOOT - COMPLETE 3+ VIEW COMPARISON:  None. FINDINGS: Fracture involving the proximal shaft of the 5th proximal phalanx. Mild degenerative changes involving the 1st MTP joint. Visualized soft tissues are grossly unremarkable in this patient with known plantar soft tissue wound. No radiopaque foreign body is seen. IMPRESSION: Fracture involving the proximal shaft of the 5th proximal phalanx. Electronically Signed   By: Julian Hy M.D.   On: 06/10/2018 19:20    Microbiology: No results found for this or any previous visit (from the past 240 hour(s)).   Labs: Basic Metabolic Panel: No results for input(s): NA, K, CL, CO2, GLUCOSE, BUN, CREATININE, CALCIUM, MG, PHOS in the last 168 hours. Liver Function Tests: No results for input(s): AST, ALT, ALKPHOS, BILITOT, PROT, ALBUMIN in the last 168 hours. No results for input(s): LIPASE,  AMYLASE in the last 168 hours. No results for input(s): AMMONIA in the last 168 hours. CBC: No results for input(s): WBC, NEUTROABS, HGB, HCT, MCV, PLT in the last 168 hours. Cardiac Enzymes: No results for input(s): CKTOTAL, CKMB, CKMBINDEX, TROPONINI in the last 168 hours. BNP: BNP (last 3 results) No results for input(s): BNP in the last 8760 hours.  ProBNP (last 3 results) No results for input(s): PROBNP in the last 8760 hours.  CBG: No results for input(s): GLUCAP in the last 168 hours.     Signed:  Domenic Polite MD.  [facility] 06/24/2018, 5:31 PM

## 2018-06-30 ENCOUNTER — Other Ambulatory Visit: Payer: Self-pay

## 2018-06-30 DIAGNOSIS — L03116 Cellulitis of left lower limb: Secondary | ICD-10-CM

## 2018-07-15 ENCOUNTER — Other Ambulatory Visit: Payer: Self-pay

## 2018-07-15 ENCOUNTER — Encounter (INDEPENDENT_AMBULATORY_CARE_PROVIDER_SITE_OTHER): Payer: Self-pay | Admitting: Physician Assistant

## 2018-07-15 ENCOUNTER — Ambulatory Visit (INDEPENDENT_AMBULATORY_CARE_PROVIDER_SITE_OTHER): Payer: Self-pay | Admitting: Physician Assistant

## 2018-07-15 VITALS — BP 126/83 | HR 75 | Temp 97.5°F | Ht 71.0 in | Wt 145.0 lb

## 2018-07-15 DIAGNOSIS — I739 Peripheral vascular disease, unspecified: Secondary | ICD-10-CM

## 2018-07-15 DIAGNOSIS — Z1159 Encounter for screening for other viral diseases: Secondary | ICD-10-CM

## 2018-07-15 DIAGNOSIS — B351 Tinea unguium: Secondary | ICD-10-CM

## 2018-07-15 DIAGNOSIS — E1159 Type 2 diabetes mellitus with other circulatory complications: Secondary | ICD-10-CM

## 2018-07-15 DIAGNOSIS — Z23 Encounter for immunization: Secondary | ICD-10-CM

## 2018-07-15 DIAGNOSIS — L02619 Cutaneous abscess of unspecified foot: Secondary | ICD-10-CM

## 2018-07-15 DIAGNOSIS — L03119 Cellulitis of unspecified part of limb: Secondary | ICD-10-CM

## 2018-07-15 MED ORDER — GLIPIZIDE 5 MG PO TABS: 5.0000 mg | ORAL_TABLET | Freq: Every day | ORAL | 1 refills | Status: DC

## 2018-07-15 MED ORDER — METFORMIN HCL 500 MG PO TABS: 500.0000 mg | ORAL_TABLET | Freq: Two times a day (BID) | ORAL | 1 refills | Status: DC

## 2018-07-15 MED ORDER — TERBINAFINE HCL 250 MG PO TABS: 250.0000 mg | ORAL_TABLET | Freq: Every day | ORAL | 0 refills | Status: DC

## 2018-07-15 MED ORDER — LISINOPRIL 10 MG PO TABS: 10.0000 mg | ORAL_TABLET | Freq: Every day | ORAL | 1 refills | Status: DC

## 2018-07-15 MED ORDER — ASPIRIN 81 MG PO TBEC: 81.0000 mg | DELAYED_RELEASE_TABLET | Freq: Every day | ORAL | 1 refills | Status: DC

## 2018-07-15 MED ORDER — CLOPIDOGREL BISULFATE 75 MG PO TABS: 75.0000 mg | ORAL_TABLET | Freq: Every day | ORAL | 0 refills | Status: DC

## 2018-07-15 NOTE — Patient Instructions (Addendum)
Diabetes Mellitus and Nutrition When you have diabetes (diabetes mellitus), it is very important to have healthy eating habits because your blood sugar (glucose) levels are greatly affected by what you eat and drink. Eating healthy foods in the appropriate amounts, at about the same times every day, can help you:  Control your blood glucose.  Lower your risk of heart disease.  Improve your blood pressure.  Reach or maintain a healthy weight.  Every person with diabetes is different, and each person has different needs for a meal plan. Your health care provider may recommend that you work with a diet and nutrition specialist (dietitian) to make a meal plan that is best for you. Your meal plan may vary depending on factors such as:  The calories you need.  The medicines you take.  Your weight.  Your blood glucose, blood pressure, and cholesterol levels.  Your activity level.  Other health conditions you have, such as heart or kidney disease.  How do carbohydrates affect me? Carbohydrates affect your blood glucose level more than any other type of food. Eating carbohydrates naturally increases the amount of glucose in your blood. Carbohydrate counting is a method for keeping track of how many carbohydrates you eat. Counting carbohydrates is important to keep your blood glucose at a healthy level, especially if you use insulin or take certain oral diabetes medicines. It is important to know how many carbohydrates you can safely have in each meal. This is different for every person. Your dietitian can help you calculate how many carbohydrates you should have at each meal and for snack. Foods that contain carbohydrates include:  Bread, cereal, rice, pasta, and crackers.  Potatoes and corn.  Peas, beans, and lentils.  Milk and yogurt.  Fruit and juice.  Desserts, such as cakes, cookies, ice cream, and candy.  How does alcohol affect me? Alcohol can cause a sudden decrease in blood  glucose (hypoglycemia), especially if you use insulin or take certain oral diabetes medicines. Hypoglycemia can be a life-threatening condition. Symptoms of hypoglycemia (sleepiness, dizziness, and confusion) are similar to symptoms of having too much alcohol. If your health care provider says that alcohol is safe for you, follow these guidelines:  Limit alcohol intake to no more than 1 drink per day for nonpregnant women and 2 drinks per day for men. One drink equals 12 oz of beer, 5 oz of wine, or 1 oz of hard liquor.  Do not drink on an empty stomach.  Keep yourself hydrated with water, diet soda, or unsweetened iced tea.  Keep in mind that regular soda, juice, and other mixers may contain a lot of sugar and must be counted as carbohydrates.  What are tips for following this plan? Reading food labels  Start by checking the serving size on the label. The amount of calories, carbohydrates, fats, and other nutrients listed on the label are based on one serving of the food. Many foods contain more than one serving per package.  Check the total grams (g) of carbohydrates in one serving. You can calculate the number of servings of carbohydrates in one serving by dividing the total carbohydrates by 15. For example, if a food has 30 g of total carbohydrates, it would be equal to 2 servings of carbohydrates.  Check the number of grams (g) of saturated and trans fats in one serving. Choose foods that have low or no amount of these fats.  Check the number of milligrams (mg) of sodium in one serving. Most people   should limit total sodium intake to less than 2,300 mg per day.  Always check the nutrition information of foods labeled as "low-fat" or "nonfat". These foods may be higher in added sugar or refined carbohydrates and should be avoided.  Talk to your dietitian to identify your daily goals for nutrients listed on the label. Shopping  Avoid buying canned, premade, or processed foods. These  foods tend to be high in fat, sodium, and added sugar.  Shop around the outside edge of the grocery store. This includes fresh fruits and vegetables, bulk grains, fresh meats, and fresh dairy. Cooking  Use low-heat cooking methods, such as baking, instead of high-heat cooking methods like deep frying.  Cook using healthy oils, such as olive, canola, or sunflower oil.  Avoid cooking with butter, cream, or high-fat meats. Meal planning  Eat meals and snacks regularly, preferably at the same times every day. Avoid going long periods of time without eating.  Eat foods high in fiber, such as fresh fruits, vegetables, beans, and whole grains. Talk to your dietitian about how many servings of carbohydrates you can eat at each meal.  Eat 4-6 ounces of lean protein each day, such as lean meat, chicken, fish, eggs, or tofu. 1 ounce is equal to 1 ounce of meat, chicken, or fish, 1 egg, or 1/4 cup of tofu.  Eat some foods each day that contain healthy fats, such as avocado, nuts, seeds, and fish. Lifestyle   Check your blood glucose regularly.  Exercise at least 30 minutes 5 or more days each week, or as told by your health care provider.  Take medicines as told by your health care provider.  Do not use any products that contain nicotine or tobacco, such as cigarettes and e-cigarettes. If you need help quitting, ask your health care provider.  Work with a counselor or diabetes educator to identify strategies to manage stress and any emotional and social challenges. What are some questions to ask my health care provider?  Do I need to meet with a diabetes educator?  Do I need to meet with a dietitian?  What number can I call if I have questions?  When are the best times to check my blood glucose? Where to find more information:  American Diabetes Association: diabetes.org/food-and-fitness/food  Academy of Nutrition and Dietetics:  www.eatright.org/resources/health/diseases-and-conditions/diabetes  National Institute of Diabetes and Digestive and Kidney Diseases (NIH): www.niddk.nih.gov/health-information/diabetes/overview/diet-eating-physical-activity Summary  A healthy meal plan will help you control your blood glucose and maintain a healthy lifestyle.  Working with a diet and nutrition specialist (dietitian) can help you make a meal plan that is best for you.  Keep in mind that carbohydrates and alcohol have immediate effects on your blood glucose levels. It is important to count carbohydrates and to use alcohol carefully. This information is not intended to replace advice given to you by your health care provider. Make sure you discuss any questions you have with your health care provider. Document Released: 05/23/2005 Document Revised: 09/30/2016 Document Reviewed: 09/30/2016 Elsevier Interactive Patient Education  2018 [employer]  

## 2018-07-15 NOTE — Progress Notes (Signed)
Subjective:  Patient ID: Stanley Chaney, male    DOB: [birth year]  Age: 57 y.o. MRN: [id number]  CC: hospital f/u  HPI Stanley Chaney is a 57 y.o. male with a medical history of tobacco use disorder presents on hospital f/u. Went to ED on 06/10/18 with complaint of puncture wound of the left foot followed by swelling. Pt had stepped on glass and applied ice when he saw the foot swelling. He was admitted and diagnosed with diabetic foot abscess. Pt had not been to a doctor in over 20 years and did not know he was diabetic. A1c 10.4%. MRI did not show signs of osteomyelitis but did show transverse fracture of the proximal metaphysis of 5th proximal phalanx. Treated with IV ceftriaxone and Flagyl. Transitioned to oral abx at discharge. Pt taking Metformin and Glipizide as directed. Says he has decreased carbs in his diet. Not exercising yet as he is allowing time for the wounds on his left foot to heal. Does not regularly check his blood sugar despite having a glucometer and strips.    Patient also diagnosed with PAD while in hospital. Had a peripheral vascular atherectomy with balloon angioplasty of SFA. Says he has stopped smoking and drinking beer. Reportedly taking Plavix and Aspirin as directed. He will be seeing vascular in two days. No complaints regarding left leg pain or swelling. Does not endorse CP, palpitations, SOB, HA, f/c/n/v, swelling, rash, or GI/GU sxs.                Outpatient Medications Prior to Visit  Medication Sig Dispense Refill  . aspirin EC 81 MG EC tablet Take 1 tablet (81 mg total) by mouth daily. 30 tablet 0  . clopidogrel (PLAVIX) 75 MG tablet Take 1 tablet (75 mg total) by mouth daily with breakfast. 30 tablet 0  . glipiZIDE (GLUCOTROL) 5 MG tablet Take 1 tablet (5 mg total) by mouth daily before breakfast. 30 tablet 0  . metFORMIN (GLUCOPHAGE) 500 MG tablet Take 1 tablet (500 mg total) by mouth 2 (two) times daily with a meal. 60 tablet 0  . blood glucose  meter kit and supplies Dispense based on patient and insurance preference. Use up to four times daily as directed. (FOR ICD-10 E10.9, E11.9). (Patient not taking: Reported on 07/15/2018) 1 each 0  . lisinopril (PRINIVIL,ZESTRIL) 10 MG tablet Take 1 tablet (10 mg total) by mouth daily. 30 tablet 0  . acetaminophen (TYLENOL) 500 MG tablet Take 500 mg by mouth every 6 (six) hours as needed.    . nicotine (NICODERM CQ - DOSED IN MG/24 HOURS) 14 mg/24hr patch Place 1 patch (14 mg total) onto the skin daily. 28 patch 0   No facility-administered medications prior to visit.      ROS Review of Systems  Constitutional: Negative for chills, fever and malaise/fatigue.  Eyes: Negative for blurred vision.  Respiratory: Negative for shortness of breath.   Cardiovascular: Negative for chest pain and palpitations.  Gastrointestinal: Negative for abdominal pain and nausea.  Genitourinary: Negative for dysuria and hematuria.  Musculoskeletal: Negative for joint pain and myalgias.  Skin: Negative for rash.  Neurological: Negative for tingling and headaches.  Psychiatric/Behavioral: Negative for depression. The patient is not nervous/anxious.     Objective:  Ht _0  (1.803 m)   Wt 145 lb (65.8 kg)   BMI 20.22 kg/m   Vitals:   07/15/18 0852  BP: 126/83  Pulse: 75  Temp: (!) 97.5 F (36.4 C)  TempSrc: Oral  SpO2:  100%  Weight: 145 lb (65.8 kg)  Height: _0  (1.803 m)     Physical Exam  Constitutional: He is oriented to person, place, and time.  Well developed, well nourished, NAD, polite  HENT:  Head: Normocephalic and atraumatic.  Eyes: No scleral icterus.  Neck: Normal range of motion. Neck supple. No thyromegaly present.  Cardiovascular: Normal rate, regular rhythm and normal heart sounds.  Pulmonary/Chest: Effort normal and breath sounds normal.  Musculoskeletal: He exhibits no edema.  Neurological: He is alert and oriented to person, place, and time.  Skin: Skin is warm and  dry. No rash noted. No erythema. No pallor.  Desquamation of the soles of feet with yellow and dystrophic toe nails. Wounds on left foot clean and dry, no erythema, edema, suppuration, or bleeding.  Psychiatric: He has a normal mood and affect. His behavior is normal. Thought content normal.  Vitals reviewed.    Assessment & Plan:     1. Type 2 diabetes mellitus with other circulatory complication, without long-term current use of insulin (HCC) - Refill metFORMIN (GLUCOPHAGE) 500 MG tablet; Take 1 tablet (500 mg total) by mouth 2 (two) times daily with a meal.  Dispense: 180 tablet; Refill: 1 - Refill lisinopril (PRINIVIL,ZESTRIL) 10 MG tablet; Take 1 tablet (10 mg total) by mouth daily.  Dispense: 90 tablet; Refill: 1 - Refill glipiZIDE (GLUCOTROL) 5 MG tablet; Take 1 tablet (5 mg total) by mouth daily before breakfast.  Dispense: 90 tablet; Refill: 1  2. Cellulitis and abscess of foot - Seems to be healing well. Wound is clean and dry without signs of infection.  3. Onychomycosis - Begin terbinafine (LAMISIL) 250 MG tablet; Take 1 tablet (250 mg total) by mouth daily.  Dispense: 90 tablet; Refill: 0  4. Need for hepatitis C screening tes - Hepatitis c antibody (reflex)  5. Need for prophylactic vaccination and inoculation against influenza - Flu Vaccine QUAD 6+ mos PF IM (Fluarix Quad PF)  6. Need for pneumococcal vaccination - Pneumococcal polysaccharide vaccine 23-valent greater than or equal to 2yo subcutaneous/IM  7. PAD (peripheral artery disease) (HCC) - Refill clopidogrel (PLAVIX) 75 MG tablet; Take 1 tablet (75 mg total) by mouth daily with breakfast.  Dispense: 30 tablet; Refill: 0 - Refill aspirin 81 MG EC tablet; Take 1 tablet (81 mg total) by mouth daily.  Dispense: 90 tablet; Refill: 1 - Pt to follow up with vascular in two days.  Meds ordered this encounter  Medications  . DISCONTD: terbinafine (LAMISIL) 250 MG tablet    Sig: Take 1 tablet (250 mg total) by  mouth daily.    Dispense:  90 tablet    Refill:  0    Order Specific Question:   Supervising Provider    Answer:   Charlott Rakes [[id number]]  . DISCONTD: metFORMIN (GLUCOPHAGE) 500 MG tablet    Sig: Take 1 tablet (500 mg total) by mouth 2 (two) times daily with a meal.    Dispense:  180 tablet    Refill:  1    Order Specific Question:   Supervising Provider    Answer:   Charlott Rakes [[id number]]  . DISCONTD: lisinopril (PRINIVIL,ZESTRIL) 10 MG tablet    Sig: Take 1 tablet (10 mg total) by mouth daily.    Dispense:  90 tablet    Refill:  1    Order Specific Question:   Supervising Provider    Answer:   Charlott Rakes [[id number]]  . DISCONTD: glipiZIDE (GLUCOTROL) 5 MG tablet  Sig: Take 1 tablet (5 mg total) by mouth daily before breakfast.    Dispense:  90 tablet    Refill:  1    Order Specific Question:   Supervising Provider    Answer:   Charlott Rakes [[id number]]  . DISCONTD: clopidogrel (PLAVIX) 75 MG tablet    Sig: Take 1 tablet (75 mg total) by mouth daily with breakfast.    Dispense:  30 tablet    Refill:  0    Order Specific Question:   Supervising Provider    Answer:   Charlott Rakes [[id number]]  . DISCONTD: aspirin 81 MG EC tablet    Sig: Take 1 tablet (81 mg total) by mouth daily.    Dispense:  90 tablet    Refill:  1    Order Specific Question:   Supervising Provider    Answer:   Charlott Rakes [[id number]]  . terbinafine (LAMISIL) 250 MG tablet    Sig: Take 1 tablet (250 mg total) by mouth daily.    Dispense:  90 tablet    Refill:  0    Order Specific Question:   Supervising Provider    Answer:   Charlott Rakes [[id number]]  . metFORMIN (GLUCOPHAGE) 500 MG tablet    Sig: Take 1 tablet (500 mg total) by mouth 2 (two) times daily with a meal.    Dispense:  180 tablet    Refill:  1    Order Specific Question:   Supervising Provider    Answer:   Charlott Rakes [[id number]]  . lisinopril (PRINIVIL,ZESTRIL) 10 MG tablet    Sig: Take 1 tablet (10 mg total) by mouth daily.    Dispense:  90  tablet    Refill:  1    Order Specific Question:   Supervising Provider    Answer:   Charlott Rakes [[id number]]  . glipiZIDE (GLUCOTROL) 5 MG tablet    Sig: Take 1 tablet (5 mg total) by mouth daily before breakfast.    Dispense:  90 tablet    Refill:  1    Order Specific Question:   Supervising Provider    Answer:   Charlott Rakes [[id number]]  . clopidogrel (PLAVIX) 75 MG tablet    Sig: Take 1 tablet (75 mg total) by mouth daily with breakfast.    Dispense:  30 tablet    Refill:  0    Order Specific Question:   Supervising Provider    Answer:   Charlott Rakes [[id number]]  . aspirin 81 MG EC tablet    Sig: Take 1 tablet (81 mg total) by mouth daily.    Dispense:  90 tablet    Refill:  1    Order Specific Question:   Supervising Provider    Answer:   Charlott Rakes [[id number]]    Follow-up: Return in about 2 months (around 09/14/2018) for A1c and physical.   Clent Demark PA

## 2018-07-16 ENCOUNTER — Telehealth (INDEPENDENT_AMBULATORY_CARE_PROVIDER_SITE_OTHER): Payer: Self-pay

## 2018-07-16 LAB — HEPATITIS C ANTIBODY (REFLEX): HCV Ab: <0.1 s/co ratio (ref 0.0–0.9)

## 2018-07-16 LAB — HCV COMMENT:

## 2018-07-16 NOTE — Telephone Encounter (Signed)
-----   Message from Clent Demark, PA-C sent at 07/16/2018 12:49 PM EST ----- HCV negative.

## 2018-07-16 NOTE — Telephone Encounter (Signed)
Patient is aware of negative HCV. Nat Christen, CMA

## 2018-07-17 ENCOUNTER — Ambulatory Visit (INDEPENDENT_AMBULATORY_CARE_PROVIDER_SITE_OTHER)
Admission: RE | Admit: 2018-07-17 | Discharge: 2018-07-17 | Disposition: A | Discharge status: Home / Self Care | Payer: Self-pay | Source: Ambulatory Visit | Attending: Vascular Surgery | Admitting: Vascular Surgery

## 2018-07-17 ENCOUNTER — Ambulatory Visit (HOSPITAL_COMMUNITY)
Admission: RE | Admit: 2018-07-17 | Discharge: 2018-07-17 | Disposition: A | Discharge status: Home / Self Care | Payer: Self-pay | Source: Ambulatory Visit | Attending: Vascular Surgery | Admitting: Vascular Surgery

## 2018-07-17 ENCOUNTER — Other Ambulatory Visit: Payer: Self-pay

## 2018-07-17 ENCOUNTER — Ambulatory Visit (INDEPENDENT_AMBULATORY_CARE_PROVIDER_SITE_OTHER): Payer: Self-pay | Admitting: Vascular Surgery

## 2018-07-17 ENCOUNTER — Encounter: Payer: Self-pay | Admitting: *Deleted

## 2018-07-17 ENCOUNTER — Encounter: Payer: Self-pay | Admitting: Vascular Surgery

## 2018-07-17 ENCOUNTER — Other Ambulatory Visit: Payer: Self-pay | Admitting: *Deleted

## 2018-07-17 VITALS — BP 134/81 | HR 69 | Temp 97.0°F | Resp 16 | Ht 71.0 in | Wt 147.9 lb

## 2018-07-17 DIAGNOSIS — L03116 Cellulitis of left lower limb: Secondary | ICD-10-CM | POA: Insufficient documentation

## 2018-07-17 DIAGNOSIS — I739 Peripheral vascular disease, unspecified: Secondary | ICD-10-CM

## 2018-07-17 NOTE — Progress Notes (Signed)
Patient ID: Stanley Chaney, male   DOB: [birth year], 57 y.o.   MRN: [id number]  Reason for Consult: New Patient (Initial Visit) (4 wks ED consult LE Art, ABI. )   Referred by No ref. provider found  Subjective:     HPI:  Stanley Chaney is a 57 y.o. male presented to the hospital with left foot infection that was debrided bedside and sutures were placed.  Subsequently underwent left lower extremity revascularization with drug-coated balloon angioplasty.  He is taking Plavix.  He missed his orthopedic follow-up appointment.  He follows up today with ABIs and duplex of his left lower extremity.  No past medical history on file. Family History  Problem Relation Age of Onset  . Diabetes Mother   . Cancer Mother    Past Surgical History:  Procedure Laterality Date  . ABDOMINAL AORTOGRAM W/LOWER EXTREMITY Bilateral 06/15/2018   Procedure: ABDOMINAL AORTOGRAM W/LOWER EXTREMITY;  Surgeon: [name], MD;  Location: [facility];  Service: Cardiovascular;  Laterality: Bilateral;  . I&D EXTREMITY Left 06/12/2018   Procedure: Excisional Debridement left foot;  Surgeon: [name], MD;  Location: [facility];  Service: Orthopedics;  Laterality: Left;  . PERIPHERAL VASCULAR ATHERECTOMY Left 06/15/2018   Procedure: PERIPHERAL VASCULAR ATHERECTOMY;  Surgeon: [name], MD;  Location: [facility];  Service: Cardiovascular;  Laterality: Left;  SFA  . PERIPHERAL VASCULAR BALLOON ANGIOPLASTY Left 06/15/2018   Procedure: PERIPHERAL VASCULAR BALLOON ANGIOPLASTY;  Surgeon: [name], MD;  Location: [facility];  Service: Cardiovascular;  Laterality: Left;  SFA    Short Social History:  Social History   Tobacco Use  . Smoking status: Former Smoker    Last attempt to quit: 06/14/2018    Years since quitting: 0.0  . Smokeless tobacco: Never Used  Substance Use Topics  . Alcohol use: Not Currently    No Known Allergies  Current  Outpatient Medications  Medication Sig Dispense Refill  . aspirin 81 MG EC tablet Take 1 tablet (81 mg total) by mouth daily. 90 tablet 1  . blood glucose meter kit and supplies Dispense based on patient and insurance preference. Use up to four times daily as directed. (FOR ICD-10 E10.9, E11.9). 1 each 0  . clopidogrel (PLAVIX) 75 MG tablet Take 1 tablet (75 mg total) by mouth daily with breakfast. 30 tablet 0  . glipiZIDE (GLUCOTROL) 5 MG tablet Take 1 tablet (5 mg total) by mouth daily before breakfast. 90 tablet 1  . metFORMIN (GLUCOPHAGE) 500 MG tablet Take 1 tablet (500 mg total) by mouth 2 (two) times daily with a meal. 180 tablet 1  . lisinopril (PRINIVIL,ZESTRIL) 10 MG tablet Take 1 tablet (10 mg total) by mouth daily. (Patient not taking: Reported on 07/17/2018) 90 tablet 1  . terbinafine (LAMISIL) 250 MG tablet Take 1 tablet (250 mg total) by mouth daily. (Patient not taking: Reported on 07/17/2018) 90 tablet 0   No current facility-administered medications for this visit.     Review of Systems  Constitutional:  Constitutional negative. Eyes: Eyes negative.  Cardiovascular: Cardiovascular negative.  GI: Gastrointestinal negative.  Musculoskeletal: Positive for gait problem.  Skin: Positive for wound.  Hematologic: Hematologic/lymphatic negative.  Psychiatric: Psychiatric negative.        Objective:  Objective   Vitals:   07/17/18 1000  BP: 134/81  Pulse: 69  Resp: 16  Temp: (!) 97 F (36.1 C)  TempSrc: Oral  SpO2: 100%  Weight: 147 lb 14.4 oz (67.1  kg)  Height: '5\' 11"'$  (1.803 m)   Body mass index is 20.63 kg/m.  Physical Exam  Constitutional: He is oriented to person, place, and time. He appears well-developed.  HENT:  Head: Normocephalic.  Eyes: Pupils are equal, round, and reactive to light.  Neck: Normal range of motion.  Cardiovascular: Normal rate.  Pulses:      Popliteal pulses are 2+ on the left side.       Posterior tibial pulses are 2+ on the left  side.  Abdominal: Soft.  Musculoskeletal:  Left plantar foot with sutures in place and surrounding edema no sign of infection without erythema without purulence  There is a dry ulceration right plantar foot  Neurological: He is alert and oriented to person, place, and time.  Skin: Skin is warm and dry. Capillary refill takes less than 2 seconds.    Data: I have independently interpreted his left lower extremity duplex to have 50 to 74% stenosis in his left SFA that is triphasic becomes monophasic below the popliteal with a toe pressure of 111 on the left previously was 0 and an ABI 0.85 on the left.  On the right his ABI 0.52 and monophasic with absent toe pressure     Assessment/Plan:     57 year old male status post left lower extremity revascularization with balloon angioplasty.  He had a foot debridement is not followed up with his orthopedic surgeon and so we will contact their office today for him so he can have his wound evaluated and sutures removed.  We will also set him up for right lower extremity angiogram because he has a right plantar wound that is dry at this time with an ABI of 0.52 and absent toe pressure.  I congratulated him on smoking cessation at this point.     [name] MD [facility]

## 2018-07-20 ENCOUNTER — Encounter (HOSPITAL_COMMUNITY)
Admission: RE | Disposition: A | Discharge status: Home / Self Care | Payer: Self-pay | Source: Ambulatory Visit | Attending: Vascular Surgery

## 2018-07-20 ENCOUNTER — Encounter (HOSPITAL_COMMUNITY): Payer: Self-pay | Admitting: General Practice

## 2018-07-20 ENCOUNTER — Observation Stay (HOSPITAL_COMMUNITY)
Admission: RE | Admit: 2018-07-20 | Discharge: 2018-07-21 | Disposition: A | Discharge status: Home / Self Care | Payer: Self-pay | Source: Ambulatory Visit | Attending: Vascular Surgery | Admitting: Vascular Surgery

## 2018-07-20 ENCOUNTER — Other Ambulatory Visit: Payer: Self-pay

## 2018-07-20 DIAGNOSIS — I739 Peripheral vascular disease, unspecified: Secondary | ICD-10-CM | POA: Diagnosis present

## 2018-07-20 DIAGNOSIS — L97511 Non-pressure chronic ulcer of other part of right foot limited to breakdown of skin: Secondary | ICD-10-CM | POA: Insufficient documentation

## 2018-07-20 DIAGNOSIS — Z955 Presence of coronary angioplasty implant and graft: Secondary | ICD-10-CM | POA: Insufficient documentation

## 2018-07-20 DIAGNOSIS — I70235 Atherosclerosis of native arteries of right leg with ulceration of other part of foot: Principal | ICD-10-CM | POA: Insufficient documentation

## 2018-07-20 DIAGNOSIS — I998 Other disorder of circulatory system: Secondary | ICD-10-CM

## 2018-07-20 DIAGNOSIS — Z79899 Other long term (current) drug therapy: Secondary | ICD-10-CM | POA: Insufficient documentation

## 2018-07-20 DIAGNOSIS — Z7982 Long term (current) use of aspirin: Secondary | ICD-10-CM | POA: Insufficient documentation

## 2018-07-20 DIAGNOSIS — Z87891 Personal history of nicotine dependence: Secondary | ICD-10-CM | POA: Insufficient documentation

## 2018-07-20 DIAGNOSIS — Z7984 Long term (current) use of oral hypoglycemic drugs: Secondary | ICD-10-CM | POA: Insufficient documentation

## 2018-07-20 DIAGNOSIS — Z9889 Other specified postprocedural states: Secondary | ICD-10-CM | POA: Insufficient documentation

## 2018-07-20 HISTORY — PX: LOWER EXTREMITY ANGIOGRAM: SHX5955

## 2018-07-20 HISTORY — DX: Peripheral vascular disease, unspecified: I73.9

## 2018-07-20 HISTORY — PX: LOWER EXTREMITY ANGIOGRAPHY: CATH118251

## 2018-07-20 HISTORY — PX: PERIPHERAL VASCULAR BALLOON ANGIOPLASTY: CATH118281

## 2018-07-20 HISTORY — DX: Type 2 diabetes mellitus without complications: E11.9

## 2018-07-20 LAB — CBC
HCT: 38.3 % — ABNORMAL LOW (ref 39.0–52.0)
Hemoglobin: 13 g/dL (ref 13.0–17.0)
MCH: 29.5 pg (ref 26.0–34.0)
MCHC: 33.9 g/dL (ref 30.0–36.0)
MCV: 87 fL (ref 80.0–100.0)
Platelets: 331 10*3/uL (ref 150–400)
RBC: 4.4 MIL/uL (ref 4.22–5.81)
RDW: 11.9 % (ref 11.5–15.5)
WBC: 7.8 10*3/uL (ref 4.0–10.5)
nRBC: 0 % (ref 0.0–0.2)

## 2018-07-20 LAB — CREATININE, SERUM
Creatinine, Ser: 0.83 mg/dL (ref 0.61–1.24)
GFR calc Af Amer: >60 mL/min (ref >60)
GFR calc non Af Amer: >60 mL/min (ref >60)

## 2018-07-20 LAB — GLUCOSE, CAPILLARY
Glucose-Capillary: 125 mg/dL — ABNORMAL HIGH (ref 70–99)
Glucose-Capillary: 91 mg/dL (ref 70–99)
Glucose-Capillary: 127 mg/dL — ABNORMAL HIGH (ref 70–99)

## 2018-07-20 LAB — POCT I-STAT, CHEM 8
BUN: 20 mg/dL (ref 6–20)
Calcium, Ion: 1.24 mmol/L (ref 1.15–1.40)
Chloride: 101 mmol/L (ref 98–111)
Creatinine, Ser: 1.1 mg/dL (ref 0.61–1.24)
Glucose, Bld: 204 mg/dL — ABNORMAL HIGH (ref 70–99)
HCT: 42 % (ref 39.0–52.0)
Hemoglobin: 14.3 g/dL (ref 13.0–17.0)
Potassium: 4.4 mmol/L (ref 3.5–5.1)
Sodium: 140 mmol/L (ref 135–145)
TCO2: 31 mmol/L (ref 22–32)

## 2018-07-20 SURGERY — LOWER EXTREMITY ANGIOGRAPHY
Anesthesia: LOCAL | Laterality: Right

## 2018-07-20 MED ORDER — SODIUM CHLORIDE 0.9 % IV SOLN: 250.0000 mL | INTRAVENOUS | Status: DC | PRN

## 2018-07-20 MED ORDER — MORPHINE SULFATE (PF) 2 MG/ML IV SOLN: 2.0000 mg | INTRAVENOUS | Status: DC | PRN

## 2018-07-20 MED ORDER — ASPIRIN EC 81 MG PO TBEC
81.0000 mg | DELAYED_RELEASE_TABLET | Freq: Every day | ORAL | Status: DC
  Administered 2018-07-20 – 2018-07-21 (×2): 81 mg via ORAL
  Filled 2018-07-20 (×2): qty 1

## 2018-07-20 MED ORDER — LIDOCAINE HCL (PF) 1 % IJ SOLN
INTRAMUSCULAR | Status: AC
  Filled 2018-07-20: qty 30

## 2018-07-20 MED ORDER — TERBINAFINE HCL 250 MG PO TABS
250.0000 mg | ORAL_TABLET | Freq: Every day | ORAL | Status: DC
  Administered 2018-07-20 – 2018-07-21 (×2): 250 mg via ORAL
  Filled 2018-07-20 (×2): qty 1

## 2018-07-20 MED ORDER — HEPARIN SODIUM (PORCINE) 1000 UNIT/ML IJ SOLN
INTRAMUSCULAR | Status: DC | PRN
  Administered 2018-07-20: 7000 [IU] via INTRAVENOUS

## 2018-07-20 MED ORDER — LABETALOL HCL 5 MG/ML IV SOLN: 10.0000 mg | INTRAVENOUS | Status: DC | PRN

## 2018-07-20 MED ORDER — ANGIOPLASTY BOOK
Freq: Once | Status: AC
  Administered 2018-07-20: 1
  Filled 2018-07-20: qty 1

## 2018-07-20 MED ORDER — OXYCODONE HCL 5 MG PO TABS: 5.0000 mg | ORAL_TABLET | ORAL | Status: DC | PRN

## 2018-07-20 MED ORDER — ONDANSETRON HCL 4 MG/2ML IJ SOLN: 4.0000 mg | Freq: Four times a day (QID) | INTRAMUSCULAR | Status: DC | PRN

## 2018-07-20 MED ORDER — MIDAZOLAM HCL 2 MG/2ML IJ SOLN
INTRAMUSCULAR | Status: DC | PRN
  Administered 2018-07-20: 1 mg via INTRAVENOUS

## 2018-07-20 MED ORDER — INSULIN ASPART 100 UNIT/ML ~~LOC~~ SOLN
4.0000 [IU] | Freq: Three times a day (TID) | SUBCUTANEOUS | Status: DC
  Administered 2018-07-20 – 2018-07-21 (×3): 4 [IU] via SUBCUTANEOUS

## 2018-07-20 MED ORDER — MIDAZOLAM HCL 2 MG/2ML IJ SOLN
INTRAMUSCULAR | Status: AC
  Filled 2018-07-20: qty 2

## 2018-07-20 MED ORDER — HEPARIN SODIUM (PORCINE) 1000 UNIT/ML IJ SOLN
INTRAMUSCULAR | Status: AC
  Filled 2018-07-20: qty 1

## 2018-07-20 MED ORDER — INSULIN ASPART 100 UNIT/ML ~~LOC~~ SOLN
0.0000 [IU] | Freq: Three times a day (TID) | SUBCUTANEOUS | Status: DC
  Administered 2018-07-20: 2 [IU] via SUBCUTANEOUS

## 2018-07-20 MED ORDER — ACETAMINOPHEN 325 MG PO TABS: 650.0000 mg | ORAL_TABLET | ORAL | Status: DC | PRN

## 2018-07-20 MED ORDER — LIDOCAINE HCL (PF) 1 % IJ SOLN
INTRAMUSCULAR | Status: DC | PRN
  Administered 2018-07-20: 15 mL

## 2018-07-20 MED ORDER — CLOPIDOGREL BISULFATE 75 MG PO TABS
75.0000 mg | ORAL_TABLET | Freq: Every day | ORAL | Status: DC
  Administered 2018-07-20 – 2018-07-21 (×2): 75 mg via ORAL
  Filled 2018-07-20 (×2): qty 1

## 2018-07-20 MED ORDER — SODIUM CHLORIDE 0.9% FLUSH: 3.0000 mL | INTRAVENOUS | Status: DC | PRN

## 2018-07-20 MED ORDER — HEPARIN SODIUM (PORCINE) 5000 UNIT/ML IJ SOLN
5000.0000 [IU] | Freq: Three times a day (TID) | INTRAMUSCULAR | Status: DC
  Administered 2018-07-20 – 2018-07-21 (×2): 5000 [IU] via SUBCUTANEOUS
  Filled 2018-07-20 (×2): qty 1

## 2018-07-20 MED ORDER — IODIXANOL 320 MG/ML IV SOLN
INTRAVENOUS | Status: DC | PRN
  Administered 2018-07-20: 80 mL via INTRAVENOUS

## 2018-07-20 MED ORDER — SODIUM CHLORIDE 0.9 % WEIGHT BASED INFUSION
1.0000 mL/kg/h | INTRAVENOUS | Status: AC
  Administered 2018-07-20: 1 mL/kg/h via INTRAVENOUS

## 2018-07-20 MED ORDER — INSULIN ASPART 100 UNIT/ML ~~LOC~~ SOLN: 0.0000 [IU] | Freq: Every day | SUBCUTANEOUS | Status: DC

## 2018-07-20 MED ORDER — SODIUM CHLORIDE 0.9% FLUSH: 3.0000 mL | Freq: Two times a day (BID) | INTRAVENOUS | Status: DC

## 2018-07-20 MED ORDER — SODIUM CHLORIDE 0.9 % IV SOLN
INTRAVENOUS | Status: DC
  Administered 2018-07-20: 09:00:00 via INTRAVENOUS

## 2018-07-20 MED ORDER — FENTANYL CITRATE (PF) 100 MCG/2ML IJ SOLN
INTRAMUSCULAR | Status: DC | PRN
  Administered 2018-07-20: 50 ug via INTRAVENOUS

## 2018-07-20 MED ORDER — HEPARIN (PORCINE) IN NACL 1000-0.9 UT/500ML-% IV SOLN
INTRAVENOUS | Status: DC | PRN
  Administered 2018-07-20 (×2): 500 mL

## 2018-07-20 MED ORDER — LISINOPRIL 10 MG PO TABS
10.0000 mg | ORAL_TABLET | Freq: Every day | ORAL | Status: DC
  Administered 2018-07-20 – 2018-07-21 (×2): 10 mg via ORAL
  Filled 2018-07-20 (×2): qty 1

## 2018-07-20 MED ORDER — FENTANYL CITRATE (PF) 100 MCG/2ML IJ SOLN
INTRAMUSCULAR | Status: AC
  Filled 2018-07-20: qty 2

## 2018-07-20 MED ORDER — ASPIRIN EC 81 MG PO TBEC: 81.0000 mg | DELAYED_RELEASE_TABLET | Freq: Every day | ORAL | Status: DC

## 2018-07-20 MED ORDER — HYDRALAZINE HCL 20 MG/ML IJ SOLN: 5.0000 mg | INTRAMUSCULAR | Status: DC | PRN

## 2018-07-20 MED ORDER — ATORVASTATIN CALCIUM 10 MG PO TABS
10.0000 mg | ORAL_TABLET | Freq: Every day | ORAL | Status: DC
  Administered 2018-07-20: 10 mg via ORAL
  Filled 2018-07-20: qty 1

## 2018-07-20 MED ORDER — CLOPIDOGREL BISULFATE 75 MG PO TABS: 75.0000 mg | ORAL_TABLET | Freq: Every day | ORAL | Status: DC

## 2018-07-20 MED ORDER — HEPARIN (PORCINE) IN NACL 1000-0.9 UT/500ML-% IV SOLN
INTRAVENOUS | Status: AC
  Filled 2018-07-20: qty 1000

## 2018-07-20 SURGICAL SUPPLY — 26 items
BAG SNAP BAND KOVER 36X36 (MISCELLANEOUS) ×2 IMPLANT
BALLN ADMIRAL INPACT 5X250 (BALLOONS) ×3
BALLN MUSTANG 5X200X135 (BALLOONS) ×3
BALLOON ADMIRAL INPACT 5X250 (BALLOONS) ×1 IMPLANT
BALLOON MUSTANG 5X200X135 (BALLOONS) ×1 IMPLANT
BUR JETSTREAM XC 2.4/3.4 (BURR) ×1 IMPLANT
BURR JETSTREAM XC 2.4/3.4 (BURR) ×3
CATH OMNI FLUSH 5F 65CM (CATHETERS) ×2 IMPLANT
CATH QUICKCROSS SUPP .035X90CM (MICROCATHETER) ×2 IMPLANT
CLOSURE MYNX CONTROL 6F/7F (Vascular Products) ×2 IMPLANT
COVER DOME SNAP 22 D (MISCELLANEOUS) ×2 IMPLANT
DEVICE TORQUE .025-.038 (MISCELLANEOUS) ×2 IMPLANT
GUIDEWIRE ANGLED .035X260CM (WIRE) ×2 IMPLANT
KIT ENCORE 26 ADVANTAGE (KITS) ×2 IMPLANT
KIT MICROPUNCTURE NIT STIFF (SHEATH) ×2 IMPLANT
KIT PV (KITS) ×3 IMPLANT
SHEATH FLEXOR ANSEL 1 7F 45CM (SHEATH) ×2 IMPLANT
SHEATH PINNACLE 5F 10CM (SHEATH) ×2 IMPLANT
SHEATH PINNACLE 7F 10CM (SHEATH) ×2 IMPLANT
SHEATH PROBE COVER 6X72 (BAG) ×2 IMPLANT
SHIELD RADPAD SCOOP 12X17 (MISCELLANEOUS) ×2 IMPLANT
SYR MEDRAD MARK V 150ML (SYRINGE) ×3 IMPLANT
TRANSDUCER W/STOPCOCK (MISCELLANEOUS) ×3 IMPLANT
TRAY PV CATH (CUSTOM PROCEDURE TRAY) ×3 IMPLANT
WIRE BENTSON .035X145CM (WIRE) ×2 IMPLANT
WIRE SPARTACORE .014X300CM (WIRE) ×2 IMPLANT

## 2018-07-20 NOTE — Op Note (Signed)
    Patient name: Stanley Chaney MRN: [id number] DOB: 07/18/1961 Sex: male  07/20/2018 Pre-operative Diagnosis: Critical right lower extremity ischemia Post-operative diagnosis:  Same Surgeon:  [name], MD Procedure Performed: 1.  Ultrasound-guided cannulation left common femoral artery 2.  Right lower extremity angiogram 3.  Jetstream atherectomy right SFA 4.  Drug-coated balloon angioplasty of right SFA with 5 x 250 mm in.pact 5.  Minx device closure left common femoral artery 6.  Moderate sedation with fentanyl and Versed for 50 minutes  Indications: 57 year old male has recently undergone drug-coated balloon angioplasty of his left SFA for a wound on his foot.  He also has a dry ulcer on his right foot.  He is indicated for angiogram possible intervention.  Findings: SFA on the right was heavily calcified and occluded for a short segment approximately 10 cm.  After atherectomy and plain balloon Angie plasty there was no further dissection so we placed a drug-coated balloon angioplasty at nominal pressure for 3 minutes and at completion there was no further dissection no stenosis with three-vessel runoff to the foot.   Procedure:  The patient was identified in the holding area and taken to room 8.  The patient was then placed supine on the table and prepped and draped in the usual sterile fashion.  A time out was called.  Ultrasound was used to evaluate the left common femoral artery this was anesthetized with 1% lidocaine the artery was cannulated with direct visualization of ultrasound with micro puncture needle followed by wire sheath.  An image was saved the permanent record.  We placed a Bentson wire followed by 5 [place] sheath.  We used a Omni Flush catheter across the bifurcation perform right lower extremity angiogram.  We then exchanged over a Bentson wire for a long 7 French sheath which was placed into the SFA.  Glidewire and quick cross catheter were used to cross the  occluded SFA after 7000 units of heparin had been given.  We confirmed intraluminal access at the popliteal.  We exchanged for an 014 wire performed jetstream atherectomy of the SFA with both blades down and blades up.  We then performed plain balloon Angie plasty with 5 mm balloon and at completion of this there was no residual stenosis or dissection.  We then inflated a 5 mm drug-coated balloon for 3 minutes at 5 atm.  After this we had again no further residual dissection or stenosis and had three-vessel runoff to the foot.  Satisfied with this we exchanged for short 7 French sheath over a Bentson wire and a minx device was deployed with success.  He tolerated this procedure well without immediate complication.  Contrast: 80cc  Daziya Redmond C. Donzetta Matters, MD [facility] [place] Office: [phone] Pager: [phone]

## 2018-07-20 NOTE — H&P (Signed)
   History and Physical Update  The patient was interviewed and re-examined.  The patient's previous History and Physical has been reviewed and is unchanged from recent office visit. Plan for aortogram with right lower runoff.  Keyly Baldonado C. Donzetta Matters, MD [facility] [place] Office: [phone] Pager: [phone]  07/20/2018, 9:48 AM

## 2018-07-21 ENCOUNTER — Encounter (HOSPITAL_COMMUNITY): Payer: Self-pay | Admitting: Vascular Surgery

## 2018-07-21 LAB — CBC
HCT: 38.4 % — ABNORMAL LOW (ref 39.0–52.0)
Hemoglobin: 12.6 g/dL — ABNORMAL LOW (ref 13.0–17.0)
MCH: 29.3 pg (ref 26.0–34.0)
MCHC: 32.8 g/dL (ref 30.0–36.0)
MCV: 89.3 fL (ref 80.0–100.0)
Platelets: 305 10*3/uL (ref 150–400)
RBC: 4.3 MIL/uL (ref 4.22–5.81)
RDW: 12.2 % (ref 11.5–15.5)
WBC: 6.3 10*3/uL (ref 4.0–10.5)
nRBC: 0 % (ref 0.0–0.2)

## 2018-07-21 LAB — BASIC METABOLIC PANEL
Anion gap: 5 (ref 5–15)
BUN: 17 mg/dL (ref 6–20)
CO2: 28 mmol/L (ref 22–32)
Calcium: 9.4 mg/dL (ref 8.9–10.3)
Chloride: 107 mmol/L (ref 98–111)
Creatinine, Ser: 0.98 mg/dL (ref 0.61–1.24)
GFR calc Af Amer: >60 mL/min (ref >60)
GFR calc non Af Amer: >60 mL/min (ref >60)
Glucose, Bld: 132 mg/dL — ABNORMAL HIGH (ref 70–99)
Potassium: 4.8 mmol/L (ref 3.5–5.1)
Sodium: 140 mmol/L (ref 135–145)

## 2018-07-21 LAB — GLUCOSE, CAPILLARY: Glucose-Capillary: 109 mg/dL — ABNORMAL HIGH (ref 70–99)

## 2018-07-21 MED ORDER — ATORVASTATIN CALCIUM 10 MG PO TABS: 10.0000 mg | ORAL_TABLET | Freq: Every day | ORAL | 11 refills | Status: DC

## 2018-07-21 NOTE — Discharge Summary (Addendum)
Physician Discharge Summary  Patient ID: Stanley Chaney MRN: [id number] DOB/AGE: Sep 12, 1960 57 y.o.  Admit date: 07/20/2018 Discharge date: 07/21/2018  Admission Diagnosis: Critical right lower extremity ischemia  Discharge Diagnoses:  same  Secondary Diagnoses: Active Problems:   PAD (peripheral artery disease) ([facility])   Procedures: Procedure Performed: 1.  Ultrasound-guided cannulation left common femoral artery 2.  Right lower extremity angiogram 3.  Jetstream atherectomy right SFA 4.  Drug-coated balloon angioplasty of right SFA with 5 x 250 mm in.pact 5.  Minx device closure left common femoral artery 6.  Moderate sedation with fentanyl and Versed for 50 minutes  Discharged Condition: good  Hospital Course: Patient was admitted post angiography for overnight observation.  In the morning he was noted to be perfusing both of his extremities well with palpable posterior tibial pulses.  He has a dry eschar overlying his first metatarsal head on the right and a stable small I&D incision in his left plantar foot.  There is no signal significant hematoma in the left groin.  Consults:  None  Significant Diagnostic Studies: CBC CBC Latest Ref Rng & Units 07/21/2018 07/20/2018 07/20/2018  WBC 4.0 - 10.5 K/uL 6.3 7.8 -  Hemoglobin 13.0 - 17.0 g/dL 12.6(L) 13.0 14.3  Hematocrit 39.0 - 52.0 % 38.4(L) 38.3(L) 42.0  Platelets 150 - 400 K/uL 305 331 -      Disposition: Home  Discharge Instructions    Call MD for:  redness, tenderness, or signs of infection (pain, swelling, bleeding, redness, odor or green/yellow discharge around incision site)   Complete by:  As directed    Call MD for:  severe or increased pain, loss or decreased feeling  in affected limb(s)   Complete by:  As directed    Call MD for:  temperature >100.5   Complete by:  As directed    Resume previous diet   Complete by:  As directed      Allergies as of 07/21/2018   No Known Allergies      Medication List    TAKE these medications   aspirin 81 MG EC tablet Take 1 tablet (81 mg total) by mouth daily.   atorvastatin 10 MG tablet Commonly known as:  LIPITOR Take 1 tablet (10 mg total) by mouth daily at 6 PM.   blood glucose meter kit and supplies Dispense based on patient and insurance preference. Use up to four times daily as directed. (FOR ICD-10 E10.9, E11.9).   clopidogrel 75 MG tablet Commonly known as:  PLAVIX Take 1 tablet (75 mg total) by mouth daily with breakfast.   glipiZIDE 5 MG tablet Commonly known as:  GLUCOTROL Take 1 tablet (5 mg total) by mouth daily before breakfast.   lisinopril 10 MG tablet Commonly known as:  PRINIVIL,ZESTRIL Take 1 tablet (10 mg total) by mouth daily.   metFORMIN 500 MG tablet Commonly known as:  GLUCOPHAGE Take 1 tablet (500 mg total) by mouth 2 (two) times daily with a meal.   terbinafine 250 MG tablet Commonly known as:  LAMISIL Take 1 tablet (250 mg total) by mouth daily.      Atorvastatin has been added and sent to his pharmacy to be initiated upon discharge.  Signed:  Eda Paschal. Donzetta Matters, MD [facility] Office: [phone] Pager: [phone]  07/21/2018, 7:51 AM

## 2018-07-22 ENCOUNTER — Telehealth: Payer: Self-pay | Admitting: Vascular Surgery

## 2018-07-22 NOTE — Telephone Encounter (Signed)
sch appt spk to pt mld ltr 08/26/18 10am ABI 11am RLE art 08-28-18 1pm p/o PA

## 2018-07-22 NOTE — Telephone Encounter (Signed)
-----   Message from [name], MD sent at 07/21/2018  7:49 AM EST ----- Roosvelt Harps [id number] 1960-11-29  F/u in 4-6 weeks with pa or me with abi and right lower extremity  duplex.  brandon

## 2018-07-27 ENCOUNTER — Other Ambulatory Visit: Payer: Self-pay

## 2018-07-27 DIAGNOSIS — I739 Peripheral vascular disease, unspecified: Secondary | ICD-10-CM

## 2018-08-11 ENCOUNTER — Inpatient Hospital Stay (INDEPENDENT_AMBULATORY_CARE_PROVIDER_SITE_OTHER): Payer: Self-pay | Admitting: Physician Assistant

## 2018-08-26 ENCOUNTER — Ambulatory Visit (HOSPITAL_COMMUNITY): Payer: Self-pay | Attending: Vascular Surgery

## 2018-08-26 ENCOUNTER — Ambulatory Visit (HOSPITAL_COMMUNITY): Admission: RE | Admit: 2018-08-26 | Payer: Self-pay | Source: Ambulatory Visit

## 2018-08-27 ENCOUNTER — Encounter: Payer: Self-pay | Admitting: General Practice

## 2018-08-31 ENCOUNTER — Encounter: Payer: Self-pay | Admitting: General Practice

## 2018-09-14 ENCOUNTER — Other Ambulatory Visit: Payer: Self-pay

## 2018-09-14 ENCOUNTER — Ambulatory Visit (INDEPENDENT_AMBULATORY_CARE_PROVIDER_SITE_OTHER): Payer: BLUE CROSS/BLUE SHIELD | Admitting: Physician Assistant

## 2018-09-14 ENCOUNTER — Encounter (INDEPENDENT_AMBULATORY_CARE_PROVIDER_SITE_OTHER): Payer: Self-pay | Admitting: Physician Assistant

## 2018-09-14 VITALS — BP 152/93 | HR 67 | Temp 97.5°F | Ht 69.0 in | Wt 157.2 lb

## 2018-09-14 DIAGNOSIS — Z1211 Encounter for screening for malignant neoplasm of colon: Secondary | ICD-10-CM

## 2018-09-14 DIAGNOSIS — Z9119 Patient's noncompliance with other medical treatment and regimen: Secondary | ICD-10-CM

## 2018-09-14 DIAGNOSIS — G8929 Other chronic pain: Secondary | ICD-10-CM | POA: Diagnosis not present

## 2018-09-14 DIAGNOSIS — M25512 Pain in left shoulder: Secondary | ICD-10-CM

## 2018-09-14 DIAGNOSIS — E1159 Type 2 diabetes mellitus with other circulatory complications: Secondary | ICD-10-CM | POA: Diagnosis not present

## 2018-09-14 DIAGNOSIS — Z125 Encounter for screening for malignant neoplasm of prostate: Secondary | ICD-10-CM

## 2018-09-14 DIAGNOSIS — Z91199 Patient's noncompliance with other medical treatment and regimen due to unspecified reason: Secondary | ICD-10-CM

## 2018-09-14 LAB — POCT GLYCOSYLATED HEMOGLOBIN (HGB A1C): Hemoglobin A1C: 9.8 % — AB (ref 4.0–5.6)

## 2018-09-14 MED ORDER — LANCETS MISC: 1.0000 | Freq: Three times a day (TID) | 3 refills | Status: DC | PRN

## 2018-09-14 NOTE — Progress Notes (Signed)
Subjective:  Patient ID: Stanley Chaney, male    DOB: March 16, 1961  Age: 58 y.o. MRN: [id number]  CC: annual exam  HPI Azhar Knope is a 58 y.o. male with a medical history of DM2, PAD, and tobacco use disorder presents for an annual physical. States he is feeling well except for chronic left shoulder pain with limited flexion and abduction. Has taken OTC NSAIDs with no relief of pain. Pt interested in orthopedic referral.     In regards to DM2, patient has not been compliant with his medications. Although, he does take metformin as directed. Says he favors reduction in food portions and reduction in carbohydrate intake. Eats fish and fruits more regularly. Also states the cost of medications worries him since he already has tens of thousands to pay the hospital for his admission on 06/10/18 for cellulitis and abscess of left foot.     Does not endorse CP, palpitations, SOB, HA, tingling, numbness, abdominal pain, f/c/n/v, edema, rash, or GI/GU sxs.     Outpatient Medications Prior to Visit  Medication Sig Dispense Refill  . aspirin 81 MG EC tablet Take 1 tablet (81 mg total) by mouth daily. 90 tablet 1  . metFORMIN (GLUCOPHAGE) 500 MG tablet Take 1 tablet (500 mg total) by mouth 2 (two) times daily with a meal. 180 tablet 1  . atorvastatin (LIPITOR) 10 MG tablet Take 1 tablet (10 mg total) by mouth daily at 6 PM. (Patient not taking: Reported on 09/14/2018) 30 tablet 11  . blood glucose meter kit and supplies Dispense based on patient and insurance preference. Use up to four times daily as directed. (FOR ICD-10 E10.9, E11.9). (Patient not taking: Reported on 09/14/2018) 1 each 0  . clopidogrel (PLAVIX) 75 MG tablet Take 1 tablet (75 mg total) by mouth daily with breakfast. (Patient not taking: Reported on 09/14/2018) 30 tablet 0  . glipiZIDE (GLUCOTROL) 5 MG tablet Take 1 tablet (5 mg total) by mouth daily before breakfast. (Patient not taking: Reported on 09/14/2018) 90 tablet 1  . lisinopril  (PRINIVIL,ZESTRIL) 10 MG tablet Take 1 tablet (10 mg total) by mouth daily. (Patient not taking: Reported on 09/14/2018) 90 tablet 1  . terbinafine (LAMISIL) 250 MG tablet Take 1 tablet (250 mg total) by mouth daily. (Patient not taking: Reported on 09/14/2018) 90 tablet 0   No facility-administered medications prior to visit.      ROS Review of Systems  Constitutional: Negative for chills, fever and malaise/fatigue.  Eyes: Negative for blurred vision.  Respiratory: Negative for shortness of breath.   Cardiovascular: Negative for chest pain and palpitations.  Gastrointestinal: Negative for abdominal pain and nausea.  Genitourinary: Negative for dysuria and hematuria.  Musculoskeletal: Positive for joint pain. Negative for myalgias.  Skin: Negative for rash.  Neurological: Negative for tingling and headaches.  Psychiatric/Behavioral: Negative for depression. The patient is not nervous/anxious.     Objective:  BP (!) 152/93 (BP Location: Left Arm, Patient Position: Sitting, Cuff Size: Normal)   Pulse 67   Temp (!) 97.5 F (36.4 C) (Oral)   Ht '5\' 9"'$  (1.753 m)   Wt 157 lb 3.2 oz (71.3 kg)   SpO2 99%   BMI 23.21 kg/m   BP/Weight 09/14/2018 07/21/2018 09/73/5329  Systolic BP 924 268 -  Diastolic BP 93 50 -  Wt. (Lbs) 157.2 146.17 -  BMI 23.21 - 20.97      Physical Exam Vitals signs reviewed.  Constitutional:      Comments: Well developed, well nourished, NAD,  polite  HENT:     Head: Normocephalic and atraumatic.  Eyes:     General: No scleral icterus. Neck:     Musculoskeletal: Normal range of motion and neck supple.     Thyroid: No thyromegaly.  Cardiovascular:     Rate and Rhythm: Normal rate and regular rhythm.     Heart sounds: Normal heart sounds.  Pulmonary:     Effort: Pulmonary effort is normal.     Breath sounds: Normal breath sounds.  Abdominal:     General: Bowel sounds are normal.     Palpations: Abdomen is soft.     Tenderness: There is no abdominal  tenderness.  Genitourinary:    Comments: Pt declined DRE for prostate exam. Musculoskeletal:     Comments: Left shoulder with limited flexion and abduction to approximately 80 degrees; pROM limited to approximately 100 degree on flexion and abduction. All provacative testing elicited pain.   Skin:    General: Skin is warm and dry.     Coloration: Skin is not pale.     Findings: No erythema or rash.  Neurological:     Mental Status: He is alert and oriented to person, place, and time.  Psychiatric:        Behavior: Behavior normal.        Thought Content: Thought content normal.      Assessment & Plan:    1. Type 2 diabetes mellitus with other circulatory complication, without long-term current use of insulin (HCC) - HgB A1c 9.8% down from 10.4% however patient has been noncompliant with glipizide 5 mg.  - Lancets MISC; Inject 1 each into the skin 3 (three) times daily as needed.  Dispense: 100 each; Refill: 3  2. Screening for prostate cancer - PSA; Future - PSA  3. Special screening for malignant neoplasms, colon - Fecal occult blood, imunochemical  4. Chronic left shoulder pain - Suspect adhesive capsulitis - DG Shoulder Left; Future - AMB referral to orthopedics  5. Noncompliance - Pt advised to take all medications as directed. He is trying fruits, cinnamon and fish to help lower blood sugar which is a step in several pronged approached to lowering blood sugar but is not enough on its own. Unfortunately patient is worried about the cost of medications and the large hospital bill he has to pay. Pt strongly recommended to take the medications previously prescribed and to work with insurance to obtain low cost medication.     Meds ordered this encounter  Medications  . Lancets MISC    Sig: Inject 1 each into the skin 3 (three) times daily as needed.    Dispense:  100 each    Refill:  3    E11.10. Mechanical lancets per pt's insurance coverage.    Order Specific  Question:   Supervising Provider    Answer:   Charlott Rakes [[id number]]    Follow-up: Return in about 3 months (around 12/14/2018) for DM2.   Clent Demark PA

## 2018-09-14 NOTE — Patient Instructions (Signed)
Diabetes Basics    Diabetes (diabetes mellitus) is a long-term (chronic) disease. It occurs when the body does not properly use sugar (glucose) that is released from food after you eat.  Diabetes may be caused by one or both of these problems:  · Your pancreas does not make enough of a hormone called insulin.  · Your body does not react in a normal way to insulin that it makes.  Insulin lets sugars (glucose) go into cells in your body. This gives you energy. If you have diabetes, sugars cannot get into cells. This causes high blood sugar (hyperglycemia).  Follow these instructions at home:  How is diabetes treated?  You may need to take insulin or other diabetes medicines daily to keep your blood sugar in balance. Take your diabetes medicines every day as told by your doctor. List your diabetes medicines here:  Diabetes medicines  · Name of medicine: ______________________________  ? Amount (dose): _______________ Time (a.m./p.m.): _______________ Notes: ___________________________________  · Name of medicine: ______________________________  ? Amount (dose): _______________ Time (a.m./p.m.): _______________ Notes: ___________________________________  · Name of medicine: ______________________________  ? Amount (dose): _______________ Time (a.m./p.m.): _______________ Notes: ___________________________________  If you use insulin, you will learn how to give yourself insulin by injection. You may need to adjust the amount based on the food that you eat. List the types of insulin you use here:  Insulin  · Insulin type: ______________________________  ? Amount (dose): _______________ Time (a.m./p.m.): _______________ Notes: ___________________________________  · Insulin type: ______________________________  ? Amount (dose): _______________ Time (a.m./p.m.): _______________ Notes: ___________________________________  · Insulin type: ______________________________  ? Amount (dose): _______________ Time (a.m./p.m.):  _______________ Notes: ___________________________________  · Insulin type: ______________________________  ? Amount (dose): _______________ Time (a.m./p.m.): _______________ Notes: ___________________________________  · Insulin type: ______________________________  ? Amount (dose): _______________ Time (a.m./p.m.): _______________ Notes: ___________________________________  How do I manage my blood sugar?    Check your blood sugar levels using a blood glucose monitor as directed by your doctor.  Your doctor will set treatment goals for you. Generally, you should have these blood sugar levels:  · Before meals (preprandial): 80-130 mg/dL (4.4-7.2 mmol/L).  · After meals (postprandial): below 180 mg/dL (10 mmol/L).  · A1c level: less than 7%.  Write down the times that you will check your blood sugar levels:  Blood sugar checks  · Time: _______________ Notes: ___________________________________  · Time: _______________ Notes: ___________________________________  · Time: _______________ Notes: ___________________________________  · Time: _______________ Notes: ___________________________________  · Time: _______________ Notes: ___________________________________  · Time: _______________ Notes: ___________________________________    What do I need to know about low blood sugar?  Low blood sugar is called hypoglycemia. This is when blood sugar is at or below 70 mg/dL (3.9 mmol/L). Symptoms may include:  · Feeling:  ? Hungry.  ? Worried or nervous (anxious).  ? Sweaty and clammy.  ? Confused.  ? Dizzy.  ? Sleepy.  ? Sick to your stomach (nauseous).  · Having:  ? A fast heartbeat.  ? A headache.  ? A change in your vision.  ? Tingling or no feeling (numbness) around the mouth, lips, or tongue.  ? Jerky movements that you cannot control (seizure).  · Having trouble with:  ? Moving (coordination).  ? Sleeping.  ? Passing out (fainting).  ? Getting upset easily (irritability).  Treating low blood sugar  To treat low blood  sugar, eat or drink something sugary right away. If you can think clearly and swallow safely, follow the 15:15   rule:  · Take 15 grams of a fast-acting carb (carbohydrate). Talk with your doctor about how much you should take.  · Some fast-acting carbs are:  ? Sugar tablets (glucose pills). Take 3-4 glucose pills.  ? 6-8 pieces of hard candy.  ? 4-6 oz (120-150 mL) of fruit juice.  ? 4-6 oz (120-150 mL) of regular (not diet) soda.  ? 1 Tbsp (15 mL) honey or sugar.  · Check your blood sugar 15 minutes after you take the carb.  · If your blood sugar is still at or below 70 mg/dL (3.9 mmol/L), take 15 grams of a carb again.  · If your blood sugar does not go above 70 mg/dL (3.9 mmol/L) after 3 tries, get help right away.  · After your blood sugar goes back to normal, eat a meal or a snack within 1 hour.  Treating very low blood sugar  If your blood sugar is at or below 54 mg/dL (3 mmol/L), you have very low blood sugar (severe hypoglycemia). This is an emergency. Do not wait to see if the symptoms will go away. Get medical help right away. Call your local emergency services (911 in the [place] Do not drive yourself to the hospital.  Questions to ask your health care provider  · Do I need to meet with a diabetes educator?  · What equipment will I need to care for myself at home?  · What diabetes medicines do I need? When should I take them?  · How often do I need to check my blood sugar?  · What number can I call if I have questions?  · When is my next doctor's visit?  · Where can I find a support group for people with diabetes?  Where to find more information  · American Diabetes Association: www.diabetes.org  · American Association of Diabetes Educators: www.diabeteseducator.org/patient-resources  Contact a doctor if:  · Your blood sugar is at or above 240 mg/dL (13.3 mmol/L) for 2 days in a row.  · You have been sick or have had a fever for 2 days or more, and you are not getting better.  · You have any of these  problems for more than 6 hours:  ? You cannot eat or drink.  ? You feel sick to your stomach (nauseous).  ? You throw up (vomit).  ? You have watery poop (diarrhea).  Get help right away if:  · Your blood sugar is lower than 54 mg/dL (3 mmol/L).  · You get confused.  · You have trouble:  ? Thinking clearly.  ? Breathing.  Summary  · Diabetes (diabetes mellitus) is a long-term (chronic) disease. It occurs when the body does not properly use sugar (glucose) that is released from food after digestion.  · Take insulin and diabetes medicines as told.  · Check your blood sugar every day, as often as told.  · Keep all follow-up visits as told by your doctor. This is important.  This information is not intended to replace advice given to you by your health care provider. Make sure you discuss any questions you have with your health care provider.  Document Released: 11/28/2017 Document Revised: 02/16/2018 Document Reviewed: 11/28/2017  Elsevier Interactive Patient Education © 2019 [employer]

## 2018-09-15 ENCOUNTER — Telehealth (INDEPENDENT_AMBULATORY_CARE_PROVIDER_SITE_OTHER): Payer: Self-pay

## 2018-09-15 LAB — PSA: Prostate Specific Ag, Serum: 0.7 ng/mL (ref 0.0–4.0)

## 2018-09-15 NOTE — Telephone Encounter (Signed)
Patient is aware that prostate result is normal. Stanley Chaney, CMA

## 2018-09-15 NOTE — Telephone Encounter (Signed)
-----   Message from Clent Demark, PA-C sent at 09/15/2018  8:44 AM EST ----- Prostate result normal.

## 2018-09-23 ENCOUNTER — Ambulatory Visit (INDEPENDENT_AMBULATORY_CARE_PROVIDER_SITE_OTHER): Payer: BLUE CROSS/BLUE SHIELD | Admitting: Orthopaedic Surgery

## 2018-09-23 ENCOUNTER — Encounter (INDEPENDENT_AMBULATORY_CARE_PROVIDER_SITE_OTHER): Payer: Self-pay | Admitting: Orthopaedic Surgery

## 2018-09-23 ENCOUNTER — Ambulatory Visit (INDEPENDENT_AMBULATORY_CARE_PROVIDER_SITE_OTHER): Payer: BLUE CROSS/BLUE SHIELD

## 2018-09-23 DIAGNOSIS — M25512 Pain in left shoulder: Secondary | ICD-10-CM

## 2018-09-23 DIAGNOSIS — M7542 Impingement syndrome of left shoulder: Secondary | ICD-10-CM

## 2018-09-23 DIAGNOSIS — G8929 Other chronic pain: Secondary | ICD-10-CM

## 2018-09-23 NOTE — Progress Notes (Signed)
Office Visit Note   Patient: Stanley Chaney           Date of Birth: 06-11-1961           MRN: [id number] Visit Date: 09/23/2018              Requested by: Clent Demark, PA-C No address on file PCP: Clent Demark, PA-C   Assessment & Plan: Visit Diagnoses:  1. Chronic left shoulder pain   2. Impingement syndrome of left shoulder     Plan: I do feel that most of all he would benefit from outpatient physical therapy but he is reluctant to do so.  I did place a steroid injection subacromial space concerning his goal helped him most from a pain standpoint and hopefully can help get his shoulder moving better.  I showed him things on him to try.  All question concerns were answered and addressed.  I explained in detail to him what the process with the shoulder is involving.  Hopefully he will understand this fully and the injection will help.  We will see him back in 4 weeks to see how he is doing overall.  Follow-Up Instructions: Return in about 4 weeks (around 10/21/2018).   Orders:  Orders Placed This Encounter  Procedures  . XR Shoulder Left   No orders of the defined types were placed in this encounter.     Procedures: No procedures performed   Clinical Data: No additional findings.   Subjective: Chief Complaint  Patient presents with  . Left Shoulder - Pain  Patient is a very pleasant 58 year old gentleman with worsening left shoulder pain for about a year now.  His range of motion and strength are decreased over the year.  It sounds like he is uses shoulder less and less and has created more more problems with stiffness and impingement.  He denies any injuries.  It started wake him up at night first and now is gotten to the point where it is detrimentally affecting his mobility, his quality of life and his activities daily living.  He does have a history of severe peripheral vascular disease and has had interventions to improve blood flow to his legs.  He  states that the shoulder hurts mainly with just reaching overhead and behind him and it is quite stiff.  HPI  Review of Systems He currently denies any headache, chest pain, shortness of breath, fever, chills, nausea, vomiting.  He does not check his blood glucose regularly so were unsure of how it is running.  Objective: Vital Signs: There were no vitals taken for this visit.  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  It is significantly stiff and painful throughout rotation.  Clinically it is well located. Specialty Comments:  No specialty comments available.  Imaging: Xr Shoulder Left  Result Date: 09/23/2018 3 views of the left shoulder show no acute findings.  The shoulder is well located.    PMFS History: Patient Active Problem List   Diagnosis Date Noted  . PAD (peripheral artery disease) ([place]) 07/20/2018  . Malnutrition of moderate degree 06/11/2018  . Cellulitis of left foot 06/10/2018  . Diabetes mellitus ([place]) 06/10/2018  . Hyperglycemia 06/10/2018  . Cellulitis 06/10/2018   Past Medical History:  Diagnosis Date  . Diabetes mellitus without complication ([place])   . PAD (peripheral artery disease) ([place])     Family  History  Problem Relation Age of Onset  . Diabetes Mother   . Cancer Mother     Past Surgical History:  Procedure Laterality Date  . ABDOMINAL AORTOGRAM W/LOWER EXTREMITY Bilateral 06/15/2018   Procedure: ABDOMINAL AORTOGRAM W/LOWER EXTREMITY;  Surgeon: [name], MD;  Location: [facility];  Service: Cardiovascular;  Laterality: Bilateral;  . I&D EXTREMITY Left 06/12/2018   Procedure: Excisional Debridement left foot;  Surgeon: [name], MD;  Location: [facility];  Service: Orthopedics;  Laterality: Left;  . LOWER EXTREMITY ANGIOGRAM Right 07/20/2018     Right lower extremity angiogram  . LOWER EXTREMITY  ANGIOGRAPHY Right 07/20/2018   Procedure: Lower Extremity Angiography;  Surgeon: [name], MD;  Location: [facility];  Service: Cardiovascular;  Laterality: Right;  . PERIPHERAL VASCULAR ATHERECTOMY Left 06/15/2018   Procedure: PERIPHERAL VASCULAR ATHERECTOMY;  Surgeon: [name], MD;  Location: [facility];  Service: Cardiovascular;  Laterality: Left;  SFA  . PERIPHERAL VASCULAR BALLOON ANGIOPLASTY Left 06/15/2018   Procedure: PERIPHERAL VASCULAR BALLOON ANGIOPLASTY;  Surgeon: [name], MD;  Location: [facility];  Service: Cardiovascular;  Laterality: Left;  SFA  . PERIPHERAL VASCULAR BALLOON ANGIOPLASTY Right 07/20/2018   Procedure: PERIPHERAL VASCULAR BALLOON ANGIOPLASTY;  Surgeon: [name], MD;  Location: [facility];  Service: Cardiovascular;  Laterality: Right;  SFA WITH DRUG COATED    Social History   Occupational History  . Not on file  Tobacco Use  . Smoking status: Former Smoker    Last attempt to quit: 06/14/2018    Years since quitting: 0.2  . Smokeless tobacco: Never Used  Substance and Sexual Activity  . Alcohol use: Not Currently  . Drug use: Never  . Sexual activity: Not on file

## 2018-10-13 ENCOUNTER — Encounter (HOSPITAL_COMMUNITY): Payer: Self-pay

## 2018-10-13 ENCOUNTER — Other Ambulatory Visit: Payer: Self-pay

## 2018-10-13 ENCOUNTER — Emergency Department (HOSPITAL_COMMUNITY)
Admission: EM | Admit: 2018-10-13 | Discharge: 2018-10-13 | Disposition: A | Discharge status: Home / Self Care | Payer: BLUE CROSS/BLUE SHIELD | Attending: Emergency Medicine | Admitting: Emergency Medicine

## 2018-10-13 ENCOUNTER — Emergency Department (HOSPITAL_COMMUNITY): Payer: BLUE CROSS/BLUE SHIELD

## 2018-10-13 DIAGNOSIS — Z955 Presence of coronary angioplasty implant and graft: Secondary | ICD-10-CM | POA: Insufficient documentation

## 2018-10-13 DIAGNOSIS — Y929 Unspecified place or not applicable: Secondary | ICD-10-CM | POA: Diagnosis not present

## 2018-10-13 DIAGNOSIS — Z7982 Long term (current) use of aspirin: Secondary | ICD-10-CM | POA: Diagnosis not present

## 2018-10-13 DIAGNOSIS — Y9389 Activity, other specified: Secondary | ICD-10-CM | POA: Diagnosis not present

## 2018-10-13 DIAGNOSIS — Y999 Unspecified external cause status: Secondary | ICD-10-CM | POA: Insufficient documentation

## 2018-10-13 DIAGNOSIS — E1165 Type 2 diabetes mellitus with hyperglycemia: Secondary | ICD-10-CM | POA: Diagnosis not present

## 2018-10-13 DIAGNOSIS — Z23 Encounter for immunization: Secondary | ICD-10-CM | POA: Diagnosis not present

## 2018-10-13 DIAGNOSIS — S91302A Unspecified open wound, left foot, initial encounter: Secondary | ICD-10-CM | POA: Diagnosis not present

## 2018-10-13 DIAGNOSIS — Z87891 Personal history of nicotine dependence: Secondary | ICD-10-CM | POA: Diagnosis not present

## 2018-10-13 DIAGNOSIS — X19XXXA Contact with other heat and hot substances, initial encounter: Secondary | ICD-10-CM | POA: Insufficient documentation

## 2018-10-13 DIAGNOSIS — S99922A Unspecified injury of left foot, initial encounter: Secondary | ICD-10-CM | POA: Diagnosis not present

## 2018-10-13 DIAGNOSIS — Z7984 Long term (current) use of oral hypoglycemic drugs: Secondary | ICD-10-CM | POA: Insufficient documentation

## 2018-10-13 DIAGNOSIS — S92512D Displaced fracture of proximal phalanx of left lesser toe(s), subsequent encounter for fracture with routine healing: Secondary | ICD-10-CM | POA: Diagnosis not present

## 2018-10-13 MED ORDER — BACITRACIN ZINC 500 UNIT/GM EX OINT: 1.0000 "application " | TOPICAL_OINTMENT | Freq: Two times a day (BID) | CUTANEOUS | 0 refills | Status: AC

## 2018-10-13 MED ORDER — CEPHALEXIN 500 MG PO CAPS: 500.0000 mg | ORAL_CAPSULE | Freq: Four times a day (QID) | ORAL | 0 refills | Status: AC

## 2018-10-13 MED ORDER — TETANUS-DIPHTH-ACELL PERTUSSIS 5-2.5-18.5 LF-MCG/0.5 IM SUSP
0.5000 mL | Freq: Once | INTRAMUSCULAR | Status: AC
  Administered 2018-10-13: 0.5 mL via INTRAMUSCULAR
  Filled 2018-10-13: qty 0.5

## 2018-10-13 NOTE — ED Notes (Signed)
Pt verbalized discharge instructions and follow up care. Alert and ambulatory. Wife is picking him up

## 2018-10-13 NOTE — ED Notes (Signed)
Pt stated they have diabetes and are worried about the complications from this.

## 2018-10-13 NOTE — Discharge Instructions (Addendum)
You need to call the podiatry office on your discharge paperwork to make an appointment for follow up.  You also need to have your wounds rechecked in the emergency department or by your regular doctor in 48 hours to ensure that there is no progression of your wounds.  You were given an antibiotic to treat a potential wound infection. Take the antibiotic as directed.   Please also use bacitracin ointment on your wounds twice daily and always wear shoes when walking.   Please return to the emergency room immediately if you experience any new or worsening symptoms or any symptoms that indicate worsening infection such as fevers, increased redness/swelling/pain, warmth, or drainage from the affected area.
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
## 2018-10-17 ENCOUNTER — Ambulatory Visit (INDEPENDENT_AMBULATORY_CARE_PROVIDER_SITE_OTHER): Payer: BLUE CROSS/BLUE SHIELD | Admitting: Podiatry

## 2018-10-17 ENCOUNTER — Encounter: Payer: Self-pay | Admitting: Podiatry

## 2018-10-17 VITALS — BP 144/88 | HR 65 | Resp 16

## 2018-10-17 DIAGNOSIS — I739 Peripheral vascular disease, unspecified: Secondary | ICD-10-CM | POA: Diagnosis not present

## 2018-10-17 DIAGNOSIS — L97522 Non-pressure chronic ulcer of other part of left foot with fat layer exposed: Secondary | ICD-10-CM | POA: Diagnosis not present

## 2018-10-17 DIAGNOSIS — E0843 Diabetes mellitus due to underlying condition with diabetic autonomic (poly)neuropathy: Secondary | ICD-10-CM | POA: Diagnosis not present

## 2018-10-17 DIAGNOSIS — I70245 Atherosclerosis of native arteries of left leg with ulceration of other part of foot: Secondary | ICD-10-CM

## 2018-10-21 ENCOUNTER — Ambulatory Visit (INDEPENDENT_AMBULATORY_CARE_PROVIDER_SITE_OTHER): Payer: BLUE CROSS/BLUE SHIELD | Admitting: Orthopaedic Surgery

## 2018-10-21 ENCOUNTER — Encounter (INDEPENDENT_AMBULATORY_CARE_PROVIDER_SITE_OTHER): Payer: Self-pay | Admitting: Orthopaedic Surgery

## 2018-10-21 DIAGNOSIS — M25512 Pain in left shoulder: Secondary | ICD-10-CM | POA: Diagnosis not present

## 2018-10-21 DIAGNOSIS — G8929 Other chronic pain: Secondary | ICD-10-CM

## 2018-10-21 DIAGNOSIS — M7542 Impingement syndrome of left shoulder: Secondary | ICD-10-CM | POA: Diagnosis not present

## 2018-10-21 NOTE — Progress Notes (Signed)
The patient is still not seeing in follow-up today.  We saw him several months ago with severe impingement syndrome and pain and immobility of the left shoulder.  He is someone who is originally from [place]  He does speak [place] but it certainly is broken.  He said he has severe diabetes and once he finally had that he quit smoking and drinking.  He does describe chest pain when he wakes up at night on that shoulder but he has severe shoulder pain.  He feels like it may be a circulatory issue as well.  I have encouraged him to see his cardiologist or his primary care physician about that.  As far as his shoulder exam there is he shows deficits the rotator cuff of the left shoulder with decreased mobility of the shoulder and severe weakness as well.  He cannot abduct the shoulder well and his external rotation is significant limited.  There is some atrophy of the intratendinous muscle looking at his scapula as well.  His right shoulder exam is normal.  At this point I have tried injection in her shoulder and this did not help much.  I am certainly concerned about the architecture of his shoulder in terms of the soft tissue in the rotator cuff itself.  His x-rays from last time did not show any significant deformities.  I do feel that he likely has a degree of rotator cuff tear and an MRI would be helpful.  I did again stressed to him the importance of seeing his primary care physician or cardiologist about the chest pain that he is having at night.  He demonstrated understanding of this.

## 2018-10-22 ENCOUNTER — Other Ambulatory Visit (INDEPENDENT_AMBULATORY_CARE_PROVIDER_SITE_OTHER): Payer: Self-pay

## 2018-10-22 DIAGNOSIS — G8929 Other chronic pain: Secondary | ICD-10-CM

## 2018-10-22 DIAGNOSIS — M25512 Pain in left shoulder: Principal | ICD-10-CM
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
## 2018-11-02 ENCOUNTER — Ambulatory Visit (INDEPENDENT_AMBULATORY_CARE_PROVIDER_SITE_OTHER): Payer: BLUE CROSS/BLUE SHIELD | Admitting: Podiatry

## 2018-11-02 DIAGNOSIS — I739 Peripheral vascular disease, unspecified: Secondary | ICD-10-CM

## 2018-11-02 DIAGNOSIS — L97522 Non-pressure chronic ulcer of other part of left foot with fat layer exposed: Secondary | ICD-10-CM

## 2018-11-02 DIAGNOSIS — I70245 Atherosclerosis of native arteries of left leg with ulceration of other part of foot: Secondary | ICD-10-CM

## 2018-11-02 DIAGNOSIS — E0843 Diabetes mellitus due to underlying condition with diabetic autonomic (poly)neuropathy: Secondary | ICD-10-CM

## 2018-11-04 NOTE — Progress Notes (Signed)
   Subjective:  58 year old male with PMHx of DM presenting today for follow up evaluation of ulcerations of the left foot. He states he is doing well overall. He reports the wounds seem to be healing appropriately. He has been using the Betadine as directed and has completed the course of antibiotics he received from the ED. There are no modifying factors noted. Patient is here for further evaluation and treatment.   Past Medical History:  Diagnosis Date  . Diabetes mellitus without complication ([place])   . PAD (peripheral artery disease) ([place])       Objective/Physical Exam General: The patient is alert and oriented x3 in no acute distress.  Dermatology:  Wound #1 noted to the left medial foot measuring 0.8 x 0.8 x 0.1 cm (LxWxD).   Wound #2 noted to the left lateral plantar foot measuring 2.5 x 2.5 x 0.3 cm.   To the noted ulceration(s), there is no eschar. There is a moderate amount of slough, fibrin, and necrotic tissue noted. Granulation tissue and wound base is red. There is a minimal amount of serosanguineous drainage noted. There is no exposed bone muscle-tendon ligament or joint. There is no malodor. Periwound integrity is intact. Skin is warm, dry and supple bilateral lower extremities.  Vascular: Diminished pedal pulses bilaterally. No edema or erythema noted. Capillary refill within normal limits.  Neurological: Epicritic and protective threshold diminished bilaterally.   Musculoskeletal Exam: Range of motion within normal limits to all pedal and ankle joints bilateral. Muscle strength 5/5 in all groups bilateral.   Assessment: #1 ulceration of the left medial foot secondary to diabetes mellitus #2 ulceration of the lateral plantar left foot secondary to diabetes mellitus  #3 PVD #4 diabetes mellitus w/ peripheral neuropathy   Plan of Care:  #1 Patient was evaluated. #2 medically necessary excisional debridement including subcutaneous tissue was performed using a  tissue nipper and a chisel blade. Excisional debridement of all the necrotic nonviable tissue down to healthy bleeding viable tissue was performed with post-debridement measurements same as pre-. #3 the wound was cleansed and dry sterile dressing applied. #4 Continue using Betadine daily with bandage.  #5 Patient has finished oral antibiotics from the ED.  #6 Continue management by vein and vascular specialists.  #7 patient is to return to clinic in 3 weeks.   Edrick Kins, DPM [facility]  Dr. Edrick Kins, [address]                                        [place], [place] [address]                Office [phone]  Fax [phone]

## 2018-11-05 ENCOUNTER — Encounter (INDEPENDENT_AMBULATORY_CARE_PROVIDER_SITE_OTHER): Payer: Self-pay | Admitting: Orthopaedic Surgery

## 2018-11-05 ENCOUNTER — Other Ambulatory Visit (INDEPENDENT_AMBULATORY_CARE_PROVIDER_SITE_OTHER): Payer: Self-pay

## 2018-11-05 ENCOUNTER — Ambulatory Visit (INDEPENDENT_AMBULATORY_CARE_PROVIDER_SITE_OTHER): Payer: BLUE CROSS/BLUE SHIELD | Admitting: Orthopaedic Surgery

## 2018-11-05 DIAGNOSIS — G8929 Other chronic pain: Secondary | ICD-10-CM

## 2018-11-05 DIAGNOSIS — M25512 Pain in left shoulder: Principal | ICD-10-CM

## 2018-11-05 NOTE — Progress Notes (Signed)
I have been following the patient for some time now for his left shoulder.  He is only 58 years old and has had significant pain and decreased function of motion of his left shoulder for some time now.  He is failed conservative treatment measures.  He has been to therapy and injections.  His right shoulder is normal but the left shoulder has still severe limitations with getting his shoulder over his head and externally rotated.  It is well located in x-rays show a normal-appearing shoulder from a structural standpoint but obviously on exam he is showing signs of significant arthrofibrosis.  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  At this point an MRI is warranted to assess the rotator cuff and the other structures within the shoulder.  We will work on getting this scheduled.  I do feel it is medically necessary at this point given the failure of all forms of conservative treatment and given the loss of function of his left shoulder.

## 2018-11-12 ENCOUNTER — Emergency Department (HOSPITAL_COMMUNITY)
Admission: EM | Admit: 2018-11-12 | Discharge: 2018-11-12 | Disposition: A | Discharge status: Home / Self Care | Payer: BLUE CROSS/BLUE SHIELD | Attending: Emergency Medicine | Admitting: Emergency Medicine

## 2018-11-12 ENCOUNTER — Emergency Department (HOSPITAL_COMMUNITY): Payer: BLUE CROSS/BLUE SHIELD

## 2018-11-12 ENCOUNTER — Ambulatory Visit (INDEPENDENT_AMBULATORY_CARE_PROVIDER_SITE_OTHER): Payer: BLUE CROSS/BLUE SHIELD | Admitting: Primary Care

## 2018-11-12 ENCOUNTER — Ambulatory Visit: Payer: BLUE CROSS/BLUE SHIELD | Admitting: Emergency Medicine

## 2018-11-12 ENCOUNTER — Other Ambulatory Visit: Payer: Self-pay

## 2018-11-12 ENCOUNTER — Encounter (INDEPENDENT_AMBULATORY_CARE_PROVIDER_SITE_OTHER): Payer: Self-pay | Admitting: Primary Care

## 2018-11-12 ENCOUNTER — Encounter (HOSPITAL_COMMUNITY): Payer: Self-pay

## 2018-11-12 VITALS — BP 141/77 | HR 67 | Temp 97.4°F | Ht 70.0 in | Wt 152.0 lb

## 2018-11-12 VITALS — BP 169/75 | HR 66 | Temp 97.8°F | Resp 18 | Ht 70.0 in | Wt 151.8 lb

## 2018-11-12 DIAGNOSIS — R5383 Other fatigue: Secondary | ICD-10-CM | POA: Insufficient documentation

## 2018-11-12 DIAGNOSIS — G8929 Other chronic pain: Secondary | ICD-10-CM | POA: Diagnosis not present

## 2018-11-12 DIAGNOSIS — R079 Chest pain, unspecified: Secondary | ICD-10-CM

## 2018-11-12 DIAGNOSIS — R0789 Other chest pain: Secondary | ICD-10-CM | POA: Diagnosis not present

## 2018-11-12 DIAGNOSIS — R0602 Shortness of breath: Secondary | ICD-10-CM | POA: Insufficient documentation

## 2018-11-12 DIAGNOSIS — Z87891 Personal history of nicotine dependence: Secondary | ICD-10-CM | POA: Diagnosis not present

## 2018-11-12 DIAGNOSIS — R9431 Abnormal electrocardiogram [ECG] [EKG]: Secondary | ICD-10-CM | POA: Diagnosis not present

## 2018-11-12 DIAGNOSIS — E119 Type 2 diabetes mellitus without complications: Secondary | ICD-10-CM | POA: Diagnosis not present

## 2018-11-12 DIAGNOSIS — Z7984 Long term (current) use of oral hypoglycemic drugs: Secondary | ICD-10-CM | POA: Insufficient documentation

## 2018-11-12 DIAGNOSIS — M25512 Pain in left shoulder: Secondary | ICD-10-CM | POA: Diagnosis not present

## 2018-11-12 DIAGNOSIS — R911 Solitary pulmonary nodule: Secondary | ICD-10-CM | POA: Insufficient documentation

## 2018-11-12 LAB — CBC WITH DIFFERENTIAL/PLATELET
Abs Immature Granulocytes: 0.02 10*3/uL (ref 0.00–0.07)
Basophils Absolute: 0.1 10*3/uL (ref 0.0–0.1)
Basophils Relative: 1 %
Eosinophils Absolute: 0.3 10*3/uL (ref 0.0–0.5)
Eosinophils Relative: 4 %
HCT: 42.6 % (ref 39.0–52.0)
Hemoglobin: 14.2 g/dL (ref 13.0–17.0)
Immature Granulocytes: 0 %
Lymphocytes Relative: 38 %
Lymphs Abs: 2.6 10*3/uL (ref 0.7–4.0)
MCH: 28.3 pg (ref 26.0–34.0)
MCHC: 33.3 g/dL (ref 30.0–36.0)
MCV: 85 fL (ref 80.0–100.0)
Monocytes Absolute: 0.7 10*3/uL (ref 0.1–1.0)
Monocytes Relative: 10 %
Neutro Abs: 3.2 10*3/uL (ref 1.7–7.7)
Neutrophils Relative %: 47 %
Platelets: 249 10*3/uL (ref 150–400)
RBC: 5.01 MIL/uL (ref 4.22–5.81)
RDW: 11.9 % (ref 11.5–15.5)
WBC: 6.9 10*3/uL (ref 4.0–10.5)
nRBC: 0 % (ref 0.0–0.2)

## 2018-11-12 LAB — COMPREHENSIVE METABOLIC PANEL
ALT: 16 U/L (ref 0–44)
AST: 16 U/L (ref 15–41)
Albumin: 3.8 g/dL (ref 3.5–5.0)
Alkaline Phosphatase: 139 U/L — ABNORMAL HIGH (ref 38–126)
Anion gap: 11 (ref 5–15)
BUN: 15 mg/dL (ref 6–20)
CO2: 26 mmol/L (ref 22–32)
Calcium: 9.7 mg/dL (ref 8.9–10.3)
Chloride: 99 mmol/L (ref 98–111)
Creatinine, Ser: 0.79 mg/dL (ref 0.61–1.24)
GFR calc Af Amer: >60 mL/min (ref >60)
GFR calc non Af Amer: >60 mL/min (ref >60)
Glucose, Bld: 251 mg/dL — ABNORMAL HIGH (ref 70–99)
Potassium: 4.4 mmol/L (ref 3.5–5.1)
Sodium: 136 mmol/L (ref 135–145)
Total Bilirubin: 0.4 mg/dL (ref 0.3–1.2)
Total Protein: 7.1 g/dL (ref 6.5–8.1)

## 2018-11-12 LAB — I-STAT TROPONIN, ED
Troponin i, poc: 0 ng/mL (ref 0.00–0.08)
Troponin i, poc: 0.01 ng/mL (ref 0.00–0.08)

## 2018-11-12 LAB — LIPASE, BLOOD: Lipase: 37 U/L (ref 11–51)

## 2018-11-12 LAB — BRAIN NATRIURETIC PEPTIDE: B Natriuretic Peptide: 41.1 pg/mL (ref 0.0–100.0)

## 2018-11-12 LAB — D-DIMER, QUANTITATIVE: D-Dimer, Quant: 0.76 ug/mL-FEU — ABNORMAL HIGH (ref 0.00–0.50)

## 2018-11-12 MED ORDER — IOHEXOL 350 MG/ML SOLN
75.0000 mL | Freq: Once | INTRAVENOUS | Status: AC | PRN
  Administered 2018-11-12: 75 mL via INTRAVENOUS

## 2018-11-12 NOTE — Progress Notes (Signed)
Patient came to clinic with a need for an ECG from [facility].  Patient complains of left shoulder pain radiating to the left chest area.  Patient state the pain occurs in the early morning and stays for 30-40 minutes.  Patient is without pain at present time.  Patient had an ECG with was abnormal. Patient PCP contacted Juluis Mire who stated that patient should go to ED.  Patient advise to go to ED

## 2018-11-12 NOTE — ED Notes (Signed)
ED Provider at bedside. 

## 2018-11-12 NOTE — ED Provider Notes (Signed)
Blood pressure 136/73, pulse (!) 58, resp. rate 20, height 5\' 10"  (1.778 m), weight 68 kg, SpO2 97 %.  Assuming care from Dr. Sherry Ruffing.  In short, Stanley Chaney is a 58 y.o. male with a chief complaint of Chest Pain .  Refer to the original H&P for additional details.  The current plan of care is to follow up repeat troponin and likely discharge if negative.   Second troponin negative. D-dimer elevated and CTA obtained without PE. Did discuss pulmonary nodule and follow up recommendation. Discussed with patient as well. He will discuss with PCP.    EKG Interpretation  Date/Time:  Thursday November 12 2018 13:20:19 EST Ventricular Rate:  67 PR Interval:    QRS Duration: 96 QT Interval:  387 QTC Calculation: 409 R Axis:   -28 Text Interpretation:  Sinus rhythm Paired ventricular premature complexes Borderline left axis deviation Borderline low voltage, extremity leads Baseline wander in lead(s) II When compared to prior, more wandering baseline.  No STEMI Confirmed by Antony Blackbird [phone]) on 11/12/2018 1:29:42 PM         Nupur Hohman, [facility], MD 11/13/18 [phone]

## 2018-11-12 NOTE — ED Notes (Signed)
Patient transported to CT scan . 

## 2018-11-12 NOTE — ED Notes (Signed)
Pt transported to Xray. 

## 2018-11-12 NOTE — Discharge Instructions (Signed)
Your evaluation for chest pain was normal today. You do have a lung nodule found on CT. Ask your PCP regarding follow up for this. You may need follow up imaging in the next 3 months.

## 2018-11-12 NOTE — Progress Notes (Signed)
Acute Office Visit  Subjective:    Patient ID: Stanley Chaney, male    DOB: 1960/10/10, 58 y.o.   MRN: [id number]  Chief Complaint  Patient presents with  . Chest Pain    HPI Patient is in today for an acute visit unclear what exactly is the problem he describes chest pain with shortness of breath than goes to his left shoulder down his arm. Note he is followed by ortho for bilateral shoulder pain. This pain wakes him up at night if he takes a walk pain subsides. Will ck EKG re refer him for a cardiologist to r/o cardiac nature. Past medical history of DM2, PAD, and tobacco use.   Past Medical History:  Diagnosis Date  . Diabetes mellitus without complication ([place])   . PAD (peripheral artery disease) ([place])     Past Surgical History:  Procedure Laterality Date  . ABDOMINAL AORTOGRAM W/LOWER EXTREMITY Bilateral 06/15/2018   Procedure: ABDOMINAL AORTOGRAM W/LOWER EXTREMITY;  Surgeon: [name], MD;  Location: [facility];  Service: Cardiovascular;  Laterality: Bilateral;  . I&D EXTREMITY Left 06/12/2018   Procedure: Excisional Debridement left foot;  Surgeon: [name], MD;  Location: [facility];  Service: Orthopedics;  Laterality: Left;  . LOWER EXTREMITY ANGIOGRAM Right 07/20/2018     Right lower extremity angiogram  . LOWER EXTREMITY ANGIOGRAPHY Right 07/20/2018   Procedure: Lower Extremity Angiography;  Surgeon: [name], MD;  Location: [facility];  Service: Cardiovascular;  Laterality: Right;  . PERIPHERAL VASCULAR ATHERECTOMY Left 06/15/2018   Procedure: PERIPHERAL VASCULAR ATHERECTOMY;  Surgeon: [name], MD;  Location: [facility];  Service: Cardiovascular;  Laterality: Left;  SFA  . PERIPHERAL VASCULAR BALLOON ANGIOPLASTY Left 06/15/2018   Procedure: PERIPHERAL VASCULAR BALLOON ANGIOPLASTY;  Surgeon: [name], MD;  Location: [facility];  Service: Cardiovascular;  Laterality: Left;   SFA  . PERIPHERAL VASCULAR BALLOON ANGIOPLASTY Right 07/20/2018   Procedure: PERIPHERAL VASCULAR BALLOON ANGIOPLASTY;  Surgeon: [name], MD;  Location: [facility];  Service: Cardiovascular;  Laterality: Right;  SFA WITH DRUG COATED     Family History  Problem Relation Age of Onset  . Diabetes Mother   . Cancer Mother     Social History   Socioeconomic History  . Marital status: Married    Spouse name: Not on file  . Number of children: Not on file  . Years of education: Not on file  . Highest education level: Not on file  Occupational History  . Not on file  Social Needs  . Financial resource strain: Not on file  . Food insecurity:    Worry: Not on file    Inability: Not on file  . Transportation needs:    Medical: Not on file    Non-medical: Not on file  Tobacco Use  . Smoking status: Former Smoker    Last attempt to quit: 06/14/2018    Years since quitting: 0.4  . Smokeless tobacco: Never Used  Substance and Sexual Activity  . Alcohol use: Not Currently  . Drug use: Never  . Sexual activity: Not on file  Lifestyle  . Physical activity:    Days per week: Not on file    Minutes per session: Not on file  . Stress: Not on file  Relationships  . Social connections:    Talks on phone: Not on file    Gets together: Not on file    Attends religious service:  Not on file    Active member of club or organization: Not on file    Attends meetings of clubs or organizations: Not on file    Relationship status: Not on file  . Intimate partner violence:    Fear of current or ex partner: Not on file    Emotionally abused: Not on file    Physically abused: Not on file    Forced sexual activity: Not on file  Other Topics Concern  . Not on file  Social History Narrative  . Not on file    Outpatient Medications Prior to Visit  Medication Sig Dispense Refill  . blood glucose meter kit and supplies Dispense based on patient and insurance preference.  Use up to four times daily as directed. (FOR ICD-10 E10.9, E11.9). 1 each 0  . metFORMIN (GLUCOPHAGE) 500 MG tablet Take 1 tablet (500 mg total) by mouth 2 (two) times daily with a meal. 180 tablet 1  . aspirin 81 MG EC tablet Take 1 tablet (81 mg total) by mouth daily. (Patient not taking: Reported on 11/12/2018) 90 tablet 1  . atorvastatin (LIPITOR) 10 MG tablet Take 1 tablet (10 mg total) by mouth daily at 6 PM. (Patient not taking: Reported on 11/12/2018) 30 tablet 11  . clopidogrel (PLAVIX) 75 MG tablet Take 1 tablet (75 mg total) by mouth daily with breakfast. (Patient not taking: Reported on 11/12/2018) 30 tablet 0  . glipiZIDE (GLUCOTROL) 5 MG tablet Take 1 tablet (5 mg total) by mouth daily before breakfast. (Patient not taking: Reported on 11/12/2018) 90 tablet 1  . Lancets MISC Inject 1 each into the skin 3 (three) times daily as needed. (Patient not taking: Reported on 11/12/2018) 100 each 3  . lisinopril (PRINIVIL,ZESTRIL) 10 MG tablet Take 1 tablet (10 mg total) by mouth daily. (Patient not taking: Reported on 11/12/2018) 90 tablet 1  . terbinafine (LAMISIL) 250 MG tablet Take 1 tablet (250 mg total) by mouth daily. (Patient not taking: Reported on 11/12/2018) 90 tablet 0   No facility-administered medications prior to visit.     No Known Allergies  Review of Systems  Constitutional: Negative.   HENT: Positive for hearing loss.   Eyes: Negative.   Respiratory: Positive for shortness of breath.   Cardiovascular: Positive for chest pain.  Gastrointestinal: Negative.   Genitourinary: Negative.   Musculoskeletal: Negative.   Skin: Negative.   Neurological: Negative.   Endo/Heme/Allergies: Negative.   Psychiatric/Behavioral: Negative.        Objective:    Physical Exam  BP (!) 141/77 (BP Location: Left Arm, Patient Position: Sitting, Cuff Size: Normal)   Pulse 67   Temp (!) 97.4 F (36.3 C) (Oral)   Ht '5\' 10"'  (1.778 m)   Wt 152 lb (68.9 kg)   SpO2 100%   BMI 21.81 kg/m  Wt  Readings from Last 3 Encounters:  11/12/18 152 lb (68.9 kg)  10/13/18 145 lb (65.8 kg)  09/14/18 157 lb 3.2 oz (71.3 kg)    Health Maintenance Due  Topic Date Due  . OPHTHALMOLOGY EXAM  12/03/1968  . Fecal DNA (Cologuard)  12/03/2008    There are no preventive care reminders to display for this patient.   Lab Results  Component Value Date   TSH 0.590 06/11/2018   Lab Results  Component Value Date   WBC 6.3 07/21/2018   HGB 12.6 (L) 07/21/2018   HCT 38.4 (L) 07/21/2018   MCV 89.3 07/21/2018   PLT 305 07/21/2018   Lab  Results  Component Value Date   NA 140 07/21/2018   K 4.8 07/21/2018   CO2 28 07/21/2018   GLUCOSE 132 (H) 07/21/2018   BUN 17 07/21/2018   CREATININE 0.98 07/21/2018   BILITOT 0.8 06/12/2018   ALKPHOS 91 06/12/2018   AST 15 06/12/2018   ALT 12 06/12/2018   PROT 7.0 06/12/2018   ALBUMIN 2.9 (L) 06/12/2018   CALCIUM 9.4 07/21/2018   ANIONGAP 5 07/21/2018   Lab Results  Component Value Date   CHOL 148 06/11/2018   Lab Results  Component Value Date   HDL 28 (L) 06/11/2018   Lab Results  Component Value Date   LDLCALC 100 (H) 06/11/2018   Lab Results  Component Value Date   TRIG 98 06/11/2018   Lab Results  Component Value Date   CHOLHDL 5.3 06/11/2018   Lab Results  Component Value Date   HGBA1C 9.8 (A) 09/14/2018       Assessment & Plan:   Problem List Items Addressed This Visit    None    1. Chest pain, unspecified type  - EKG 12-Lead  Unable to obtained sent [facility]   No orders of the defined types were placed in this encounter.    Kerin Perna, NP

## 2018-11-12 NOTE — ED Provider Notes (Signed)
[facility] EMERGENCY DEPARTMENT Provider Note   CSN: [id number] Arrival date & time: 11/12/18  1310    History   Chief Complaint Chief Complaint  Patient presents with  . Chest Pain    HPI Stanley Chaney is a 58 y.o. male.     The history is provided by the patient and medical records. No language interpreter was used.  Chest Pain  Pain location:  L chest and L lateral chest Pain quality: aching   Pain radiates to:  L shoulder Pain severity:  Moderate Onset quality:  Gradual Duration:  3 days Timing:  Rare Progression:  Resolved Chronicity:  New Relieved by:  Nothing Worsened by:  Nothing Ineffective treatments:  None tried Associated symptoms: fatigue and shortness of breath   Associated symptoms: no abdominal pain, no back pain, no cough, no diaphoresis, no fever, no headache, no nausea, no palpitations, no vomiting and no weakness     Past Medical History:  Diagnosis Date  . Diabetes mellitus without complication ([place])   . PAD (peripheral artery disease) [facility])     Patient Active Problem List   Diagnosis Date Noted  . PAD (peripheral artery disease) ([place]) 07/20/2018  . Malnutrition of moderate degree 06/11/2018  . Cellulitis of left foot 06/10/2018  . Diabetes mellitus ([place]) 06/10/2018  . Hyperglycemia 06/10/2018  . Cellulitis 06/10/2018    Past Surgical History:  Procedure Laterality Date  . ABDOMINAL AORTOGRAM W/LOWER EXTREMITY Bilateral 06/15/2018   Procedure: ABDOMINAL AORTOGRAM W/LOWER EXTREMITY;  Surgeon: [name], MD;  Location: [facility];  Service: Cardiovascular;  Laterality: Bilateral;  . I&D EXTREMITY Left 06/12/2018   Procedure: Excisional Debridement left foot;  Surgeon: [name], MD;  [facility];  Service: Orthopedics;  Laterality: Left;  . LOWER EXTREMITY ANGIOGRAM Right 07/20/2018     Right lower extremity angiogram  . LOWER EXTREMITY ANGIOGRAPHY Right 07/20/2018   Procedure: Lower  Extremity Angiography;  Surgeon: [name], MD;  Location: [facility];  Service: Cardiovascular;  Laterality: Right;  . PERIPHERAL VASCULAR ATHERECTOMY Left 06/15/2018   Procedure: PERIPHERAL VASCULAR ATHERECTOMY;  Surgeon: [name], MD;  Location: [facility];  Service: Cardiovascular;  Laterality: Left;  SFA  . PERIPHERAL VASCULAR BALLOON ANGIOPLASTY Left 06/15/2018   Procedure: PERIPHERAL VASCULAR BALLOON ANGIOPLASTY;  Surgeon: [name], MD;  Location: [facility];  Service: Cardiovascular;  Laterality: Left;  SFA  . PERIPHERAL VASCULAR BALLOON ANGIOPLASTY Right 07/20/2018   Procedure: PERIPHERAL VASCULAR BALLOON ANGIOPLASTY;  Surgeon: [name], MD;  Location: [facility];  Service: Cardiovascular;  Laterality: Right;  SFA WITH DRUG COATED         Home Medications    Prior to Admission medications   Medication Sig Start Date End Date Taking? Authorizing Provider  aspirin 81 MG EC tablet Take 1 tablet (81 mg total) by mouth daily. Patient not taking: Reported on 11/12/2018 07/15/18   Clent Demark, PA-C  atorvastatin (LIPITOR) 10 MG tablet Take 1 tablet (10 mg total) by mouth daily at 6 PM. Patient not taking: Reported on 11/12/2018 07/21/18   [name], MD  blood glucose meter kit and supplies Dispense based on patient and insurance preference. Use up to four times daily as directed. (FOR ICD-10 E10.9, E11.9). 06/16/18   Domenic Polite, MD  clopidogrel (PLAVIX) 75 MG tablet Take 1 tablet (75 mg total) by mouth daily with breakfast. Patient not taking: Reported on 11/12/2018 07/15/18  Clent Demark, PA-C  glipiZIDE (GLUCOTROL) 5 MG tablet Take 1 tablet (5 mg total) by mouth daily before breakfast. Patient not taking: Reported on 11/12/2018 07/15/18   Clent Demark, PA-C  Lancets MISC Inject 1 each into the skin 3 (three) times daily as needed. Patient not taking: Reported on  11/12/2018 09/14/18   Clent Demark, PA-C  lisinopril (PRINIVIL,ZESTRIL) 10 MG tablet Take 1 tablet (10 mg total) by mouth daily. Patient not taking: Reported on 11/12/2018 07/15/18   Clent Demark, PA-C  metFORMIN (GLUCOPHAGE) 500 MG tablet Take 1 tablet (500 mg total) by mouth 2 (two) times daily with a meal. 07/15/18   Clent Demark, PA-C  terbinafine (LAMISIL) 250 MG tablet Take 1 tablet (250 mg total) by mouth daily. Patient not taking: Reported on 11/12/2018 07/15/18   Clent Demark, PA-C    Family History Family History  Problem Relation Age of Onset  . Diabetes Mother   . Cancer Mother     Social History Social History   Tobacco Use  . Smoking status: Former Smoker    Last attempt to quit: 06/14/2018    Years since quitting: 0.4  . Smokeless tobacco: Never Used  Substance Use Topics  . Alcohol use: Not Currently  . Drug use: Never     Allergies   Patient has no known allergies.   Review of Systems Review of Systems  Constitutional: Positive for fatigue. Negative for chills, diaphoresis and fever.  HENT: Negative for congestion.   Eyes: Negative for visual disturbance.  Respiratory: Positive for shortness of breath. Negative for cough, chest tightness, wheezing and stridor.   Cardiovascular: Positive for chest pain. Negative for palpitations.  Gastrointestinal: Negative for abdominal pain, constipation, diarrhea, nausea and vomiting.  Genitourinary: Negative for flank pain.  Musculoskeletal: Negative for back pain, neck pain and neck stiffness.  Neurological: Negative for weakness, light-headedness and headaches.  Psychiatric/Behavioral: Negative for agitation.  All other systems reviewed and are negative.    Physical Exam Updated Vital Signs There were no vitals taken for this visit.  Physical Exam Vitals signs and nursing note reviewed.  Constitutional:      General: He is not in acute distress.    Appearance: He is well-developed. He is not  ill-appearing, toxic-appearing or diaphoretic.  HENT:     Head: Normocephalic and atraumatic.  Eyes:     Conjunctiva/sclera: Conjunctivae normal.     Pupils: Pupils are equal, round, and reactive to light.  Neck:     Musculoskeletal: Neck supple.  Cardiovascular:     Rate and Rhythm: Normal rate and regular rhythm.     Heart sounds: No murmur.  Pulmonary:     Effort: Pulmonary effort is normal. No tachypnea or respiratory distress.     Breath sounds: Normal breath sounds. No decreased breath sounds, wheezing, rhonchi or rales.  Abdominal:     Palpations: Abdomen is soft.     Tenderness: There is no abdominal tenderness.  Musculoskeletal:     Right lower leg: He exhibits no tenderness. No edema.     Left lower leg: He exhibits no tenderness. No edema.  Skin:    General: Skin is warm and dry.     Capillary Refill: Capillary refill takes less than 2 seconds.  Neurological:     General: No focal deficit present.     Mental Status: He is alert.      ED Treatments / Results  Labs (all labs ordered are listed, but only  abnormal results are displayed) Labs Reviewed  COMPREHENSIVE METABOLIC PANEL - Abnormal; Notable for the following components:      Result Value   Glucose, Bld 251 (*)    Alkaline Phosphatase 139 (*)    All other components within normal limits  CBC WITH DIFFERENTIAL/PLATELET  BRAIN NATRIURETIC PEPTIDE  LIPASE, BLOOD  D-DIMER, QUANTITATIVE (NOT AT [facility])  I-STAT TROPONIN, ED    EKG EKG Interpretation  Date/Time:  Thursday November 12 2018 13:20:19 EST Ventricular Rate:  67 PR Interval:    QRS Duration: 96 QT Interval:  387 QTC Calculation: 409 R Axis:   -28 Text Interpretation:  Sinus rhythm Paired ventricular premature complexes Borderline left axis deviation Borderline low voltage, extremity leads Baseline wander in lead(s) II When compared to prior, more wandering baseline.  No STEMI Confirmed by Antony Blackbird [phone]) on 11/12/2018 1:29:42  PM   Radiology Dg Chest 2 View  Result Date: 11/12/2018 CLINICAL DATA:  Central chest pain for 1 day. History of diabetes. Former smoker. EXAM: CHEST - 2 VIEW COMPARISON:  06/11/2018 FINDINGS: Cardiac silhouette is normal in size. No mediastinal or hilar masses. No evidence of adenopathy. Clear lungs.  No pleural effusion or pneumothorax. Skeletal structures are intact. IMPRESSION: No active cardiopulmonary disease. Electronically Signed   By: Lajean Manes M.D.   On: 11/12/2018 15:19    Procedures Procedures (including critical care time)  Medications Ordered in ED Medications - No data to display   Initial Impression / Assessment and Plan / ED Course  I have reviewed the triage vital signs and the nursing notes.  Pertinent labs & imaging results that were available during my care of the patient were reviewed by me and considered in my medical decision making (see chart for details).        Stanley Chaney is a 58 y.o. male with a past medical history significant for diabetes, PAD, and prior foot cellulitis who presents at the direction of his PCP for complaint of left chest pain, left shoulder pain, and fatigue.  Patient reports that his symptoms of been ongoing for the last several days.  He reports he has had left shoulder pain for months.  He reports he is having MRI coming up next week for his left shoulder pain however when he told his PCP today that he was having discomfort in his left chest as well, they did EKG and told him to come to the emergency department.  Patient reports no history of cardiac disease.  He denies any fevers, chills, palpitations, congestion, or cough.  He does report that he has had some shortness of breath.  He reports the chest pain is not exertional or pleuritic.  He denies any chest tenderness.  He denies any nausea, vomiting, diaphoresis.  He does not think it is his heart but he was told to come here for evaluation.  On exam, lungs clear and chest has  no tenderness.  No murmur.  Patient has symmetric radial pulses.  Lower extremities are nontender nonedematous.  Normal lower extremity pulses present.  No CVA tenderness.  EKG showed wandering baseline but otherwise showed no significant abnormality.  Clinically I have low suspicion is a cardiac etiology.  Patient thinks it feels similar to his left shoulder pain which is chronic and he is getting a full musculoskeletal work-up with MRI this week.  He thinks it is rating from his shoulder to his chest wall with the pectoral muscles.  However, given his fatigue and the associated shortness  of breath, patient will work-up including chest x-ray, labs, and delta troponin.  Initial labs are reassuring.  Initial troponin negative.  BNP not elevated.  Lipase normal.  Metabolic panel showed elevated glucose but otherwise did not show AKI.  AST and ALT were normal and mild elevation in alk phos.  CBC shows no significant abnormality.  Chest x-ray is unremarkable.  As the symptoms seem to be extremely musculoskeletal in nature, we have a low suspicion for PE as etiology.  Shared decision made conversation held and we decided not to pursue a d-dimer at this time.  We will get a delta troponin however to make sure this is not his heart.  Care transferred to oncoming team while awaiting delta troponin.  If delta troponin is negative, patient will likely be stable for discharge home for outpatient PCP follow-up and to continue his left shoulder work-up this week.  4:11 PM Just prior to transfer care, patient was reassessed and he says that at one point he was told he needed to take a medication to prevent blood clots.  He is unsure if he had blood clot before but now he wants to be checked for blood clot.  D-dimer will be ordered.  If d-dimer is negative and delta troponin is negative, anticipate he will be stable for discharge home for likely musculoskeletal left chest wall and left shoulder pain to be followed up by  orthopedics.   Final Clinical Impressions(s) / ED Diagnoses   Final diagnoses:  Atypical chest pain  Shortness of breath  Fatigue, unspecified type  Chronic left shoulder pain     Clinical Impression: 1. Atypical chest pain   2. Shortness of breath   3. Fatigue, unspecified type   4. Chronic left shoulder pain     Disposition: Care transferred to oncoming team while awaiting results of d-dimer and delta troponin.  Anticipate discharge home if work-up is reassuring.  This note was prepared with assistance of Systems analyst. Occasional wrong-word or sound-a-like substitutions may have occurred due to the inherent limitations of voice recognition software.      , Gwenyth Allegra, MD 11/12/18 [phone]

## 2018-11-12 NOTE — ED Triage Notes (Signed)
Pt arrives POV from PCP office for chest pain in center of chest & abnormal EKG. Pt denies CP, n/v currently. Pt reports having left sided shoulder pain r/t "injection". Pt a&o, VSS at this time.

## 2018-11-16 ENCOUNTER — Ambulatory Visit
Admission: RE | Admit: 2018-11-16 | Discharge: 2018-11-16 | Disposition: A | Discharge status: Home / Self Care | Payer: BLUE CROSS/BLUE SHIELD | Source: Ambulatory Visit | Attending: Orthopaedic Surgery | Admitting: Orthopaedic Surgery

## 2018-11-16 DIAGNOSIS — M25512 Pain in left shoulder: Principal | ICD-10-CM

## 2018-11-16 DIAGNOSIS — G8929 Other chronic pain: Secondary | ICD-10-CM

## 2018-11-16 DIAGNOSIS — M19012 Primary osteoarthritis, left shoulder: Secondary | ICD-10-CM | POA: Diagnosis not present

## 2018-11-23 ENCOUNTER — Ambulatory Visit (INDEPENDENT_AMBULATORY_CARE_PROVIDER_SITE_OTHER): Payer: BLUE CROSS/BLUE SHIELD | Admitting: Podiatry

## 2018-11-23 ENCOUNTER — Other Ambulatory Visit: Payer: Self-pay

## 2018-11-23 DIAGNOSIS — I70245 Atherosclerosis of native arteries of left leg with ulceration of other part of foot: Secondary | ICD-10-CM | POA: Diagnosis not present

## 2018-11-23 DIAGNOSIS — I739 Peripheral vascular disease, unspecified: Secondary | ICD-10-CM

## 2018-11-23 DIAGNOSIS — L97522 Non-pressure chronic ulcer of other part of left foot with fat layer exposed: Secondary | ICD-10-CM

## 2018-11-23 DIAGNOSIS — E0843 Diabetes mellitus due to underlying condition with diabetic autonomic (poly)neuropathy: Secondary | ICD-10-CM

## 2018-11-25 NOTE — Progress Notes (Signed)
   Subjective:  58 year old male with PMHx of DM presenting today for follow up evaluation of ulcerations of the left foot. He states he is doing well overall. He reports the wounds seem to be healing well. He has been using the Betadine as directed. There are no modifying factors noted. Patient is here for further evaluation and treatment.   Past Medical History:  Diagnosis Date  . Diabetes mellitus without complication ([place])   . PAD (peripheral artery disease) ([place])       Objective/Physical Exam General: The patient is alert and oriented x3 in no acute distress.  Dermatology:  Wound #1 noted to the left medial foot measuring 1.0 x 1.0 x 0.2 cm (LxWxD).   Wound #2 noted to the left lateral plantar foot measuring 1.0 x 1.0 x 0.2 cm.   To the noted ulceration(s), there is no eschar. There is a moderate amount of slough, fibrin, and necrotic tissue noted. Granulation tissue and wound base is red. There is a minimal amount of serosanguineous drainage noted. There is no exposed bone muscle-tendon ligament or joint. There is no malodor. Periwound integrity is intact. Skin is warm, dry and supple bilateral lower extremities.  Vascular: Diminished pedal pulses bilaterally. No edema or erythema noted. Capillary refill within normal limits.  Neurological: Epicritic and protective threshold diminished bilaterally.   Musculoskeletal Exam: Range of motion within normal limits to all pedal and ankle joints bilateral. Muscle strength 5/5 in all groups bilateral.   Assessment: #1 ulceration of the left medial foot secondary to diabetes mellitus #2 ulceration of the lateral plantar left foot secondary to diabetes mellitus  #3 PVD #4 diabetes mellitus w/ peripheral neuropathy   Plan of Care:  #1 Patient was evaluated. #2 medically necessary excisional debridement including subcutaneous tissue was performed using a tissue nipper and a chisel blade. Excisional debridement of all the necrotic  nonviable tissue down to healthy bleeding viable tissue was performed with post-debridement measurements same as pre-. #3 the wound was cleansed and dry sterile dressing applied. #4 Continue using Betadine daily with bandage.  #5 Continue management by vein and vascular specialists.  #6 patient is to return to clinic in 3 weeks.   Edrick Kins, DPM [facility]  Dr. Edrick Kins, [address]                                        [place], [place] [address]                Office [phone]  Fax [phone]

## 2018-12-04 ENCOUNTER — Ambulatory Visit (HOSPITAL_COMMUNITY)
Admission: RE | Admit: 2018-12-04 | Discharge: 2018-12-04 | Disposition: A | Discharge status: Home / Self Care | Payer: BLUE CROSS/BLUE SHIELD | Source: Ambulatory Visit | Attending: Family | Admitting: Family

## 2018-12-04 ENCOUNTER — Encounter: Payer: Self-pay | Admitting: Family

## 2018-12-04 ENCOUNTER — Ambulatory Visit (INDEPENDENT_AMBULATORY_CARE_PROVIDER_SITE_OTHER)
Admission: RE | Admit: 2018-12-04 | Discharge: 2018-12-04 | Disposition: A | Discharge status: Home / Self Care | Payer: BLUE CROSS/BLUE SHIELD | Source: Ambulatory Visit | Attending: Family | Admitting: Family

## 2018-12-04 ENCOUNTER — Ambulatory Visit (INDEPENDENT_AMBULATORY_CARE_PROVIDER_SITE_OTHER): Payer: Self-pay | Admitting: Family

## 2018-12-04 ENCOUNTER — Other Ambulatory Visit: Payer: Self-pay

## 2018-12-04 VITALS — BP 183/82 | HR 70 | Temp 97.0°F | Resp 16 | Ht 70.0 in | Wt 154.0 lb

## 2018-12-04 DIAGNOSIS — IMO0002 Reserved for concepts with insufficient information to code with codable children: Secondary | ICD-10-CM

## 2018-12-04 DIAGNOSIS — E1151 Type 2 diabetes mellitus with diabetic peripheral angiopathy without gangrene: Secondary | ICD-10-CM

## 2018-12-04 DIAGNOSIS — I779 Disorder of arteries and arterioles, unspecified: Secondary | ICD-10-CM

## 2018-12-04 DIAGNOSIS — Z87891 Personal history of nicotine dependence: Secondary | ICD-10-CM

## 2018-12-04 DIAGNOSIS — E1165 Type 2 diabetes mellitus with hyperglycemia: Secondary | ICD-10-CM

## 2018-12-04 NOTE — Progress Notes (Signed)
[facility] [place]   CC: Follow up peripheral artery occlusive disease  History of Present Illness Page Lancon is a 58 y.o. male who is s/p atherectomy of right SFA and drug-coated balloon angioplasty of right SFA , left CFA access, on 07-20-18 by Dr. Donzetta Matters for critical right lower extremity ischemia.  Pt had undergone a drug-coated balloon angioplasty of his left SFA on 06-15-18 by Dr. Donzetta Matters for a wound on his foot.  He also had a dry ulcer on his right foot.  Pt returns today for 3 months follow up surveillance. However, ABI and RLE arterial duplex not done since the above procedures, will try to have performed today.   He states that his left foot ulcers have healed a great deal since the above procedure. He has no signs of ischemia in his right LE. He remains tobacco free since October 2019. He walks a dog for a total to 3-4 hours daily with no claudication symptoms.   He denies any known history of stroke or TIA.  Diabetic: Yes, 9.8 A1C on 09-14-18, uncontrolled; normal GFR and serum creatinine as of 11-12-18 Tobacco use: former smoker, quit 06-14-18  Pt meds include: Statin :Yes Betablocker: No ASA: Yes Other anticoagulants/antiplatelets: Plavix, it seems he is not taking.   Past Medical History:  Diagnosis Date  . Diabetes mellitus without complication ([place])   . PAD (peripheral artery disease) ([facility])     Social History Social History   Tobacco Use  . Smoking status: Former Smoker    Last attempt to quit: 06/14/2018    Years since quitting: 0.4  . Smokeless tobacco: Never Used  . Tobacco comment: pt reports quitting 6 months ago  Substance Use Topics  . Alcohol use: Yes    Alcohol/week: 18.0 standard drinks    Types: 18 Cans of beer per week    Comment: pt reports not drinking for past 6 months  . Drug use: Never    Family History Family History  Problem Relation Age of Onset  . Diabetes Mother   . Cancer Mother     Past Surgical  History:  Procedure Laterality Date  . ABDOMINAL AORTOGRAM W/LOWER EXTREMITY Bilateral 06/15/2018   Procedure: ABDOMINAL AORTOGRAM W/LOWER EXTREMITY;  Surgeon: [name], MD;  Location: [facility];  Service: Cardiovascular;  Laterality: Bilateral;  . I&D EXTREMITY Left 06/12/2018   Procedure: Excisional Debridement left foot;  Surgeon: [name], MD;  Location: [facility];  Service: Orthopedics;  Laterality: Left;  . LOWER EXTREMITY ANGIOGRAM Right 07/20/2018     Right lower extremity angiogram  . LOWER EXTREMITY ANGIOGRAPHY Right 07/20/2018   Procedure: Lower Extremity Angiography;  Surgeon: [name], MD;  Location: [facility];  Service: Cardiovascular;  Laterality: Right;  . PERIPHERAL VASCULAR ATHERECTOMY Left 06/15/2018   Procedure: PERIPHERAL VASCULAR ATHERECTOMY;  Surgeon: [name], MD;  Location: [facility];  Service: Cardiovascular;  Laterality: Left;  SFA  . PERIPHERAL VASCULAR BALLOON ANGIOPLASTY Left 06/15/2018   Procedure: PERIPHERAL VASCULAR BALLOON ANGIOPLASTY;  Surgeon: [name], MD;  Location: [facility];  Service: Cardiovascular;  Laterality: Left;  SFA  . PERIPHERAL VASCULAR BALLOON ANGIOPLASTY Right 07/20/2018   Procedure: PERIPHERAL VASCULAR BALLOON ANGIOPLASTY;  Surgeon: [name], MD;  Location: [facility];  Service: Cardiovascular;  Laterality: Right;  SFA WITH DRUG COATED     No Known Allergies  Current Outpatient Medications  Medication Sig Dispense Refill  .  aspirin 81 MG EC tablet Take 1 tablet (81 mg total) by mouth daily. 90 tablet 1  . blood glucose meter kit and supplies Dispense based on patient and insurance preference. Use up to four times daily as directed. (FOR ICD-10 E10.9, E11.9). 1 each 0  . metFORMIN (GLUCOPHAGE) 500 MG tablet Take 1 tablet (500 mg total) by mouth 2 (two) times daily with a meal. (Patient taking differently: Take 500 mg  by mouth 2 (two) times daily. ) 180 tablet 1  . atorvastatin (LIPITOR) 10 MG tablet Take 1 tablet (10 mg total) by mouth daily at 6 PM. (Patient not taking: Reported on 12/04/2018) 30 tablet 11  . clopidogrel (PLAVIX) 75 MG tablet Take 1 tablet (75 mg total) by mouth daily with breakfast. (Patient not taking: Reported on 12/04/2018) 30 tablet 0  . glipiZIDE (GLUCOTROL) 5 MG tablet Take 1 tablet (5 mg total) by mouth daily before breakfast. (Patient not taking: Reported on 12/04/2018) 90 tablet 1  . Lancets MISC Inject 1 each into the skin 3 (three) times daily as needed. (Patient not taking: Reported on 12/04/2018) 100 each 3  . lisinopril (PRINIVIL,ZESTRIL) 10 MG tablet Take 1 tablet (10 mg total) by mouth daily. (Patient not taking: Reported on 12/04/2018) 90 tablet 1  . terbinafine (LAMISIL) 250 MG tablet Take 1 tablet (250 mg total) by mouth daily. (Patient not taking: Reported on 12/04/2018) 90 tablet 0   No current facility-administered medications for this visit.     ROS: See HPI for pertinent positives and negatives.   Physical Examination  Vitals:   12/04/18 1022 12/04/18 1024  BP: (!) 194/79 (!) 183/82  Pulse: 70 70  Resp: 16   Temp: (!) 97 F (36.1 C)   TempSrc: Oral   SpO2: 100%   Weight: 154 lb (69.9 kg)   Height: '5\' 10"'$  (1.778 m)    Body mass index is 22.1 kg/m.  General: A&O x 3, WDWN, slender, fit appearing male. His [place] interpreter is with him. Gait: normal HENT: No gross abnormalities.  Eyes: PERRLA. Pulmonary: Respirations are non labored, CTAB, good air movement in all fields Cardiac: regular rhythm, no detected murmur.         Carotid Bruits Right Left   Negative Negative   Radial pulses are 2+ palpable bilaterally   Adominal aortic pulse is 1+ palpable (pt is thin)                      VASCULAR EXAM: Extremities without ischemic changes, without Gangrene; with open wounds:  I attempted to take photos of pt left foot ulcers using the Epic Haiku  application, however, the photos did not appear in the file.   Shallow healing small ulcers at medial aspect base of left foot first metatarsal joint, and at lateral aspect of left foot, no erythema, no drainage, no maceration, no swelling, no odor.  LE Pulses Right Left       FEMORAL  2+ palpable  2+ palpable        POPLITEAL  not palpable   not palpable       POSTERIOR TIBIAL  2+ palpable   faintly palpable        DORSALIS PEDIS      ANTERIOR TIBIAL 1+ palpable  faintly palpable    Abdomen: soft, NT, no palpable masses. Skin: no rashes, no cellulitis, See Extremities. Musculoskeletal: no muscle wasting or atrophy.  Neurologic: A&O X 3; appropriate affect, Sensation is normal; MOTOR FUNCTION:  moving all extremities equally, motor strength 5/5 throughout. Speech is fluent/normal. CN 2-12 intact. Psychiatric: Thought content is normal, mood appropriate for clinical situation.     ASSESSMENT: Stanley Chaney is a 58 y.o. male who is s/p atherectomy of right SFA and drug-coated balloon angioplasty of right SFA , left CFA access, on 07-20-18 by Dr. Donzetta Matters for critical right lower extremity ischemia.  Pt had undergone a drug-coated balloon angioplasty of his left SFA on 06-15-18 by Dr. Donzetta Matters for a wound on his foot.    His atherosclerotic risk factors include currently uncontrolled DM and former smoker (quit in October 2019).  Fortunately he walks 3-4 hours daily with no claudication sx's, his left foot ulcers are shallow and healing, and he takes a daily ASA, and a statin. It seems he is not taking Plavix.   Right ABI has improved from 52% to 100% (triphasic waveforms), left ABI remains stable at 83% (monophasic waveforms).  Arterial duplex of legs shows no stenosis in the right leg, significant stenosis in the left leg SFA at the distal segment, s/p balloon angioplasty of his left  SFA on 06-15-18.    DATA  Right LE arterial Duplex (12-04-18):\ Right Duplex Findings: +----------+--------+-----+--------+--------+--------+           PSV cm/sRatioStenosisWaveformComments +----------+--------+-----+--------+--------+--------+ CFA Distal167                  biphasic         +----------+--------+-----+--------+--------+--------+ DFA       85                   biphasic         +----------+--------+-----+--------+--------+--------+ SFA Prox  107                  biphasic         +----------+--------+-----+--------+--------+--------+ SFA Mid   116                  biphasic         +----------+--------+-----+--------+--------+--------+ SFA Distal97                   biphasic         +----------+--------+-----+--------+--------+--------+ POP Prox  61                   biphasic         +----------+--------+-----+--------+--------+--------+ POP Distal65                   biphasic         +----------+--------+-----+--------+--------+--------+ ATA Distal48                   biphasic         +----------+--------+-----+--------+--------+--------+ PTA Distal49                   biphasic         +----------+--------+-----+--------+--------+--------+  Left Duplex Findings: +----------+--------+-----+--------+----------+--------+           PSV cm/sRatioStenosisWaveform  Comments +----------+--------+-----+--------+----------+--------+ CFA Distal268                  biphasic           +----------+--------+-----+--------+----------+--------+ DFA       70                   biphasic           +----------+--------+-----+--------+----------+--------+ SFA Prox  109                  biphasic           +----------+--------+-----+--------+----------+--------+ SFA Mid   81                   biphasic           +----------+--------+-----+--------+----------+--------+ SFA  [id number]                  monophasic         +----------+--------+-----+--------+----------+--------+ POP Prox  27                   monophasic         +----------+--------+-----+--------+----------+--------+ POP Distal48                   monophasic         +----------+--------+-----+--------+----------+--------+ ATA Distal26                   monophasic         +----------+--------+-----+--------+----------+--------+ PTA Distal43                   monophasic         +----------+--------+-----+--------+----------+--------+  A focal velocity elevation of 623 cm/s was obtained at Distal left SFA with a VR of 7.5. Findings are characteristic of 75-99% stenosis.   Summary: Right: Near normal examination. Left: 75-99% stenosis noted in the superficial femoral artery and/or popliteal artery.   ABI (Date: 12/04/2018): ABI Findings: +---------+------------------+-----+---------+--------+ Right    Rt Pressure (mmHg)IndexWaveform Comment  +---------+------------------+-----+---------+--------+ Brachial 151                                      +---------+------------------+-----+---------+--------+ ATA      178               1.12 triphasic         +---------+------------------+-----+---------+--------+ PTA      150               0.94 triphasic         +---------+------------------+-----+---------+--------+ Great Toe92                0.58                   +---------+------------------+-----+---------+--------+  +---------+------------------+-----+----------+-------+ Left     Lt Pressure (mmHg)IndexWaveform  Comment +---------+------------------+-----+----------+-------+ Brachial 159                                      +---------+------------------+-----+----------+-------+ ATA      118               0.74 monophasic        +---------+------------------+-----+----------+-------+ PTA  140                0.88 monophasic        +---------+------------------+-----+----------+-------+ Great Toe79                0.50                   +---------+------------------+-----+----------+-------+  +-------+-----------+-----------+------------+------------+ ABI/TBIToday's ABIToday's TBIPrevious ABIPrevious TBI +-------+-----------+-----------+------------+------------+ Right  1.12       0.58       0.52        0            +-------+-----------+-----------+------------+------------+ Left   0.85       0.69       0.88        0.50         +-------+-----------+-----------+------------+------------+  Right ABIs appear increased compared to prior study on 07/17/2018. Left ABIs appear essentially unchanged compared to prior study on 07/17/2018.   Summary: Right: Resting right ankle-brachial index is within normal range. No evidence of significant right lower extremity arterial disease. The right toe-brachial index is abnormal. RT great toe pressure = 92 mmHg. Left: Resting left ankle-brachial index indicates mild left lower extremity arterial disease. The left toe-brachial index is abnormal. LT Great toe pressure = 79 mmHg.    PLAN:  Based on the patient's vascular studies and examination, and after Dr.[name] spoke with and examined pt, pt will be scheduled for an arteriogram, possible left LE intervention, in 3-4 weeks with Dr. Donzetta Matters.   Pt has uncontrolled DM, he requested that I refer him to an endocrinologist to help him manage his DM; referred today.  I discussed in depth with the patient the nature of atherosclerosis, and emphasized the importance of maximal medical management including strict control of blood pressure, blood glucose, and lipid levels, obtaining regular exercise, and continued cessation of smoking.  The patient is aware that without maximal medical management the underlying atherosclerotic disease process will progress, limiting the benefit of any  interventions.  The patient was given information about PAD including signs, symptoms, treatment, what symptoms should prompt the patient to seek immediate medical care, and risk reduction measures to take.  Clemon Chambers, RN, MSN, FNP-C [facility] Phone: [phone]  Clinic MD: Donzetta Matters  12/04/18 10:58 AM

## 2018-12-04 NOTE — Patient Instructions (Addendum)
??????????? ?????????????? ??????? Peripheral Vascular Disease ??????????? ?????????????? ??????? (???)- ??? ??????????? ??????????? ???????. ????? ??????? ???????? ???- ?????? ??????????????. ? ??????????? ??????? ??? ???????? ? ??????? ??????????? ???????, ?? ??????? ????? ??????????? ?? ?????? ? ?????? ?????? ?????????. ??? ????? ???????? ? ?????????????? ?????????????? ???, ??? ? ?????????? ???????, ????????, ??????? ??? ?????. ??? ?? ?????, ???? ????? ????? ??????????? ???????? ?????? ??????? ??? ? ?????. ?????????? ??? ???? ???:  ???????????? ???. ??? ???????? ???????????????? ???. ????? ??????????? ????????? ? ?????????? ??????????? ????????? ??????????? ???????.  ?????????????? ???. ????? ??????????? ????????? ? ?????????? ?????????, ??????? ???????? ? ?????? ? ??????? ??????????? ??????? (??????). ??? ??????? ??? ?????? ???????????? ? ???????? ???????. ??? ????? ????? ????????? ? ?????? ?????? ???????????. ??? ?????????, ??????? ??????????????? ????????? ????????? ?????????????? ? ???? ??? ????. ??? ?????????? ?????????. ?????? ????????  ??? ??????? ???? ????? ????????? ?? ???? ????????? ??????. ???????? ???????????????? ???????? ????????????? ??? ???????? ????????? ??????? ???????? (??????) ?????? ??????? ????????, ?.?. ????????????. ????????? ????? ?????? ????? ?????????? ?? ?????? ??????????? ??????? ? ???????? ? ??????? ??????? ????????. ??? ???????? ? ???????????? ????????? ? ????? ???????? ?????? ?????? ???????????. ??????? ????????????????? ????????? ??? ????????:  ???????? ???????, ???????????? ?????? ??????????? ???????.  ??????????? ??????????? ???????.  ???????????, ??????? ????? ????????? ? ?????????? ??? ??????? ??????????? ???????.  ????? ????? ? ???????, ????????????? ??????????? ????????????? ???. ??? ???????? ????? ??????????? ????????? ? ??? ????? ????????? ????? ???? ????, ????:  ? ??? ??????????? ???????? ??????? ?? ???.  ? ??? ??????? ????????????  ??????????? ?????????, ? ??? ?????: ? ??????? ??????? ??????????? ? ?????. ? ???????? ??????. ? ?????????? ???????????? ???????? (???????????). ? ??????????? ??????? ??????. ? ?????? ??????????? ???????, ????????? ? ????????? ?????????, ? ????????. ? ?????? ?????, ????????, ?????? ??? ?????????, ? ????????. ????? ?????? ????? ????????? ? ??????????? ??????????? ??????? ? ???????????. ? ??????? ???????. ??? ??????? ????????? ??-?? ?????????? ??????????? ??????? ? ?????? ? ???????. ? ????????? ????? ???????. ? ?????? ?????????? ??????, ??????? ???????? ??????? ? ?????. ? ??????????? ?????.  ?? ???????????? ????? ??? ??????.  ? ??? ???????????? ?????????? ????????.  ? ??? ????????.  ??? 50??? ??? ??????. ?????? ???????? ??? ????????? ??? ????????? ????? ????????? ? ????????????? ????????? ?????????. ???? ???????? ??????? ?? ????, ????? ????? ????????? ?? ???????? ???????????? ?????????? ?????. ???????????????? ???????? ? ???????? ???????? ?????????:  ?????? ? ?????? ???????? ???. ??? ????? ???? ????????? ?????????????? ?????????????? ? ????? (????????????? ????????? ??????????????).  ???? ? ???????? ? ?????. ??? ???????? ????????? ?? ????? ?????????? ?????????? ? ???????? ?? ????? ?????? (?????????????? ???????????? ????????? ??????????????).  ???? ? ????? ?? ????? ??????.  ????????, ??????????? ??? ???????? ? ?????.  ??????????? ???? ??? ??????, ? ??????????? ?? ????????? ? ?????? ?????.  ?????????, ?????????? ???? ??? ?????. ??? ????? ????: ? ????????? ?????. ? ?????? ????. ? ????????? ??? ??????????? ??????? ????. ? ?????????? ????? ?? ?????.  ????????????? ? ??????? ??? ?? ??????????? (??????????? ??????????).  ?????????? ????????????. ? ????? ? ??? ???? ?????? ??????????? ???? ? ?????? ?? ????????, ?????? ??? ?????. ?????????? ????? ??? ? ??????? ????? ??????? ?????? ????????. ??? ??? ?????????????? ??? ????????? ??????????????? ?? ?????????:  ????????????? ? ???  ????????? ? ?????????.  ??????????? ???????????? ???????????? ? ?????? ?????? ???????????? ????????.  ??????????? ?????? ????????????, ??????????? ????????, ??? ??????? ? ????????????? ? ??? ???, ? ????? ?????????? ??????? ??????? ???????????. ???????????? ????? ????????: ? ????????? ????????????? ???????? ? ????? ????? ? ?????, ? ????? ????????????? ?????? ?????? (?????????? ???????????????). ? ???????????????? ???????????? ? ?????????????? ???????? ???? ??? ????????? ??????????? ??????????? ????? ????? ???? ??????????? ?????? (??????????????????). ? ???????? ? ???? ??????????? ?????? ????????????? ???????? ????? ??????????? ???????????????? ???????????? ? ??????????????:  ????????????? ????? (??????????? ??? ?????????????).  ???????????? ??????????? ????????????? ????? (??-???????????).  ?????????? ? ??????? ????????????????? ???? (????????-??????????? ??????????? ??? ???). ??? ??? ???????? ??????? ??? ??????? ?? ??????? ?????? ????????? ? ??????????? ????????? ? ??? ?????????. ????? ??? ??????? ?? ?????? ????????. ?????????, ??????????????? ????????????? ????? ???????????, ?????????? ?????? ? ??????????????. ? ??? ????? ?????????? ???????????? (???????????) ?????????, ????? ??? ???????? ??????, ?????????? ??????? ??????????? ? ?????????? ???????????? ????????. ??????? ????????:  ????????? ?????? ?????, ????????: ? ????? ?? ???????. ? ?????????? ?????????? ?????????? ??????????. ? ?????????? ????? ? ?????? ??????????? ????? ? ???????????.  ????? ????????????? ??????????, ????? ???: ? ????????? ??? ?????????? ?????, ??????????????? ?????????????????. ? ????????? ??? ????????? ?????????. ? ????????? ??? ??????????????? ?????? ??????????? ? ?????.  ????????????? ?????????, ????? ???: ? ?????????, ?????????????? ????????????? ?????????? ???????? ??? ?????????? ????????? ??????? ? ????????? ????????? (?????????????). ? ?????????, ?????????????? ????????? ?????????????? ?????????  ??????????????? ??????? (??????) ??? ??????????? ???????????? ??????? ? ???????? ?????????. ? ????????????? ???????? ??? ??????????????? ????????? ? ????? ??????????????? ??????? (???????????? ?????????????? ???????). ? ????????????? ???????? ??? ???????? ???????? ????? ?? ?????????????? ???? ?? ?????????? ??????????. ? ?????????. ??? ????????????? ???????? ?????????? ??????????. ????? ????????????? ????? ????????????? ? ??? ??????, ???? ?????? ?????? ?????????? ?? ??????? ????????? ??? ?????? ??????????????? ??? ????????????? ??????? ???????. ? ???????? ???????? ???????? ????? ???????????: ????? ?????  ?? ???????????? ????????, ?????????? ???????, ??? ???????? ???????, ????? ??? ???????? ? ??????????? ????????. ???? ??? ?????? ?? ??????? ??? ????? ??????, ?????????? ? ?????? ???????? ?????.  ? ?????? ??????? ???? ???????? ??? ? ??????????? ???????? ???, ?????????????? ??????? ??? ?? ????? ??????? ??????.  ??????????????? ????? ? ?????? ??????????? ????? ? ???????????. ???? ??? ????? ??????, ?????????? ? ?????? ???????? ?????.  ????????? ????????????. ???????? ? ?????? ???????? ?????, ????? ??? ??????? ??? ????????. ????? ????????  ?????????? ?????????????? ? ??????????? ????????????? ????????? ?????? ? ???????????? ? ?????????? ?????? ???????? ?????.  ?????????? ?????????? ??????? ? ????? ???????. ? ?????? ?????????? ????? ??????????? ???????. ? ????????? ?????????? ???? ?????? ?? ??????? ??????? ??? ???????.  ????????? ?? ??? ??????????? ??????, ??? ??????? ????? ??????? ??????. ??? ?????. ?????????? ? ???????? ?????, ????:  ? ??? ????????? ?????? ? ????? ?? ????? ??????.  ? ??? ????????? ???? ? ????? ?? ????? ??????.  ? ??? ??????????? ??????????? ???? ??? ??????.  ? ??? ??????????? ????????? ????.  ? ??? ??????????? ??????????.  ? ??? ?? ??????? ??????? ???????????? ?????? ??? ??????. ?????????? ?????????? ?? ???????, ????:  ???? ???? ??? ???? ?????????? ????????, ?????? ?  ??????????? ????????? ???????.  ???? ???? ??? ???? ????????, ???????, ???????, ?????????? ??????????? ??? ??????.  ? ??? ????????? ???? ? ????? ??? ???????????? ???????.  ? ??? ????????? ????????? ???????? ???????? ? ????, ???? ??? ????.  ?? ???????? ?????????? ??????????? ???????? ??? ??????? ??????????? ????????.  ? ??? ???????? ????????? ????? ??????? ???????? ???? ??? ????????? ??????. ??????  ??????????? ?????????????? ??????? (???)- ??? ??????????? ??????????? ???????.  ? ??????????? ??????? ??? ???????? ? ??????? ??????????? ???????, ?? ??????? ????? ??????????? ?? ?????? ?????? ? ?????? ?????? ?????????.  ??? ????? ????????? ? ????????????? ????????? ?????????. ???? ???????? ??????? ?? ????, ????? ????? ????????? ?? ???????? ???????????? ?????????? ?????.  ??????? ??? ??????? ?? ??????? ????????? ? ??????????? ????????? ?????????. ??? ?????????? ?? ????? ???????? ??????, ??????????????? ????? ??????. ??????????? ???????? ????? ???????????? ??? ??????? ? ????? ??????? ??????.  Document Released: 01/12/2009 Document Revised: 03/03/2017 Document Reviewed: 03/03/2017 [employer] Interactive Patient Education  2019 [employer].

## 2018-12-14 ENCOUNTER — Encounter: Payer: Self-pay | Admitting: Podiatry

## 2018-12-14 ENCOUNTER — Other Ambulatory Visit: Payer: Self-pay

## 2018-12-14 ENCOUNTER — Ambulatory Visit (INDEPENDENT_AMBULATORY_CARE_PROVIDER_SITE_OTHER): Payer: BLUE CROSS/BLUE SHIELD | Admitting: Podiatry

## 2018-12-14 VITALS — Temp 97.5°F

## 2018-12-14 DIAGNOSIS — E0843 Diabetes mellitus due to underlying condition with diabetic autonomic (poly)neuropathy: Secondary | ICD-10-CM | POA: Diagnosis not present

## 2018-12-14 DIAGNOSIS — I739 Peripheral vascular disease, unspecified: Secondary | ICD-10-CM

## 2018-12-14 DIAGNOSIS — L97522 Non-pressure chronic ulcer of other part of left foot with fat layer exposed: Secondary | ICD-10-CM

## 2018-12-14 DIAGNOSIS — I70245 Atherosclerosis of native arteries of left leg with ulceration of other part of foot: Secondary | ICD-10-CM | POA: Diagnosis not present

## 2018-12-14 NOTE — Progress Notes (Signed)
   Subjective:  58 year old male with PMHx of DM presenting today for follow up evaluation of ulcerations of the left foot.  Patient believes that he is almost healed.  He denies pain.  He does have a longstanding history of peripheral vascular disease and is being treated and managed by [facility], Dr. Servando Snare.  He states that he is due for a revascularization procedure to the left lower extremity once the COVID-19 pandemic has improved.  He presents for further treatment evaluation  Past Medical History:  Diagnosis Date  . Diabetes mellitus without complication ([place])   . PAD (peripheral artery disease) ([place])       Objective/Physical Exam General: The patient is alert and oriented x3 in no acute distress.  Dermatology:  Wound #1 noted to the left medial foot measuring 0.70.7 x 0.2 cm (LxWxD).   Wound #2 noted to the left lateral plantar foot measuring 0.50.5 x 0.2 cm.  Improvement noted since last visit. To the noted ulceration(s), there is no eschar. There is a moderate amount of slough, fibrin, and necrotic tissue noted. Granulation tissue and wound base is red. There is a minimal amount of serosanguineous drainage noted. There is no exposed bone muscle-tendon ligament or joint. There is no malodor. Periwound integrity is intact. Skin is warm, dry and supple bilateral lower extremities.  Vascular: Diminished pedal pulses bilaterally. No edema or erythema noted. Capillary refill within normal limits.  Neurological: Epicritic and protective threshold diminished bilaterally.   Musculoskeletal Exam: Range of motion within normal limits to all pedal and ankle joints bilateral. Muscle strength 5/5 in all groups bilateral.   Assessment: #1 ulceration of the left medial foot secondary to diabetes mellitus #2 ulceration of the lateral plantar left foot secondary to diabetes mellitus  #3 PVD #4 diabetes mellitus w/ peripheral neuropathy   Plan of Care:  #1 Patient was evaluated. #2  medically necessary excisional debridement including subcutaneous tissue was performed using a tissue nipper and a chisel blade. Excisional debridement of all the necrotic nonviable tissue down to healthy bleeding viable tissue was performed with post-debridement measurements same as pre-. #3 the wound was cleansed and dry sterile dressing applied. #4 Continue using Betadine daily with bandage.  #5 Continue management by vein and vascular specialists.  Patient is sent for revascularization procedure after COVID-19 pandemic. #6 patient is to return to clinic in 3 weeks.   Edrick Kins, DPM [facility]  Dr. Edrick Kins, [address]                                        [place], [place] [address]                Office [phone]  Fax [phone]

## 2018-12-16 ENCOUNTER — Telehealth: Payer: Self-pay | Admitting: Primary Care

## 2018-12-16 NOTE — Telephone Encounter (Signed)
New Message  PS-[facility] called, states they received a referral for this pt but they do not see adults and is closing the referral

## 2018-12-16 NOTE — Telephone Encounter (Signed)
Our clinic didn't refer the patient . Thank you

## 2018-12-23 ENCOUNTER — Telehealth: Payer: Self-pay | Admitting: Vascular Surgery

## 2018-12-23 ENCOUNTER — Telehealth: Payer: Self-pay | Admitting: *Deleted

## 2018-12-23 ENCOUNTER — Other Ambulatory Visit: Payer: Self-pay | Admitting: *Deleted

## 2018-12-23 NOTE — Telephone Encounter (Signed)
I informed [facility] - Kay Dr. Amalia Hailey would be fine with the Aortagram prior to the 01/04/2019 appt if pt would be available for 04/272/2020 wound follow up. Zigmund Daniel stated pt should be able to make the 01/04/2019 appt.

## 2018-12-23 NOTE — Telephone Encounter (Signed)
I called patient's interpreter Rosalyn Charters to advise her that he will need to contact [employer] to update his address since they do not have his new address on file.  Alfia verbalized understanding and will contact the patient.    Thurston Hole., LPN

## 2018-12-23 NOTE — Telephone Encounter (Signed)
[facility] - Stanley Chaney states Dr. Donzetta Matters can perform Aortagram either 12/30/2018 prior to Dr. Amalia Hailey 01/04/2019 office appt or after on 01/07/2019.

## 2018-12-30 ENCOUNTER — Encounter (HOSPITAL_COMMUNITY)
Admission: RE | Disposition: A | Discharge status: Home / Self Care | Payer: Self-pay | Source: Home / Self Care | Attending: Vascular Surgery

## 2018-12-30 ENCOUNTER — Ambulatory Visit (HOSPITAL_COMMUNITY)
Admission: RE | Admit: 2018-12-30 | Discharge: 2018-12-30 | Disposition: A | Discharge status: Home / Self Care | Payer: BLUE CROSS/BLUE SHIELD | Attending: Vascular Surgery | Admitting: Vascular Surgery

## 2018-12-30 ENCOUNTER — Encounter (HOSPITAL_COMMUNITY): Payer: Self-pay

## 2018-12-30 ENCOUNTER — Other Ambulatory Visit: Payer: Self-pay

## 2018-12-30 DIAGNOSIS — Z79899 Other long term (current) drug therapy: Secondary | ICD-10-CM | POA: Diagnosis not present

## 2018-12-30 DIAGNOSIS — E1151 Type 2 diabetes mellitus with diabetic peripheral angiopathy without gangrene: Secondary | ICD-10-CM | POA: Diagnosis not present

## 2018-12-30 DIAGNOSIS — E1165 Type 2 diabetes mellitus with hyperglycemia: Secondary | ICD-10-CM | POA: Diagnosis not present

## 2018-12-30 DIAGNOSIS — Z87891 Personal history of nicotine dependence: Secondary | ICD-10-CM | POA: Insufficient documentation

## 2018-12-30 DIAGNOSIS — Z7984 Long term (current) use of oral hypoglycemic drugs: Secondary | ICD-10-CM | POA: Diagnosis not present

## 2018-12-30 DIAGNOSIS — Z7982 Long term (current) use of aspirin: Secondary | ICD-10-CM | POA: Insufficient documentation

## 2018-12-30 DIAGNOSIS — I998 Other disorder of circulatory system: Secondary | ICD-10-CM | POA: Diagnosis not present

## 2018-12-30 DIAGNOSIS — Z833 Family history of diabetes mellitus: Secondary | ICD-10-CM | POA: Insufficient documentation

## 2018-12-30 DIAGNOSIS — I70245 Atherosclerosis of native arteries of left leg with ulceration of other part of foot: Secondary | ICD-10-CM | POA: Insufficient documentation

## 2018-12-30 DIAGNOSIS — L97529 Non-pressure chronic ulcer of other part of left foot with unspecified severity: Secondary | ICD-10-CM | POA: Diagnosis not present

## 2018-12-30 DIAGNOSIS — E11621 Type 2 diabetes mellitus with foot ulcer: Secondary | ICD-10-CM | POA: Insufficient documentation

## 2018-12-30 HISTORY — PX: ABDOMINAL AORTOGRAM W/LOWER EXTREMITY: CATH118223

## 2018-12-30 HISTORY — PX: PERIPHERAL VASCULAR INTERVENTION: CATH118257

## 2018-12-30 LAB — GLUCOSE, CAPILLARY
Glucose-Capillary: 437 mg/dL — ABNORMAL HIGH (ref 70–99)
Glucose-Capillary: 356 mg/dL — ABNORMAL HIGH (ref 70–99)
Glucose-Capillary: 238 mg/dL — ABNORMAL HIGH (ref 70–99)

## 2018-12-30 LAB — POCT I-STAT 4, (NA,K, GLUC, HGB,HCT)
Glucose, Bld: 431 mg/dL — ABNORMAL HIGH (ref 70–99)
HCT: 42 % (ref 39.0–52.0)
Hemoglobin: 14.3 g/dL (ref 13.0–17.0)
Potassium: 3.9 mmol/L (ref 3.5–5.1)
Sodium: 138 mmol/L (ref 135–145)

## 2018-12-30 LAB — POCT I-STAT CREATININE: Creatinine, Ser: 0.7 mg/dL (ref 0.61–1.24)

## 2018-12-30 SURGERY — ABDOMINAL AORTOGRAM W/LOWER EXTREMITY
Anesthesia: LOCAL | Laterality: Left

## 2018-12-30 MED ORDER — SODIUM CHLORIDE 0.9 % IV SOLN
INTRAVENOUS | Status: DC
  Administered 2018-12-30: 08:00:00 via INTRAVENOUS

## 2018-12-30 MED ORDER — INSULIN ASPART 100 UNIT/ML ~~LOC~~ SOLN
10.0000 [IU] | Freq: Once | SUBCUTANEOUS | Status: AC
  Administered 2018-12-30: 10 [IU] via SUBCUTANEOUS
  Filled 2018-12-30: qty 1

## 2018-12-30 MED ORDER — LIDOCAINE HCL (PF) 1 % IJ SOLN
INTRAMUSCULAR | Status: AC
  Filled 2018-12-30: qty 30

## 2018-12-30 MED ORDER — FENTANYL CITRATE (PF) 100 MCG/2ML IJ SOLN
INTRAMUSCULAR | Status: AC
  Filled 2018-12-30: qty 2

## 2018-12-30 MED ORDER — MIDAZOLAM HCL 2 MG/2ML IJ SOLN
INTRAMUSCULAR | Status: AC
  Filled 2018-12-30: qty 2

## 2018-12-30 MED ORDER — CLOPIDOGREL BISULFATE 300 MG PO TABS
ORAL_TABLET | ORAL | Status: AC
  Filled 2018-12-30: qty 1

## 2018-12-30 MED ORDER — MIDAZOLAM HCL 2 MG/2ML IJ SOLN
INTRAMUSCULAR | Status: DC | PRN
  Administered 2018-12-30: 1 mg via INTRAVENOUS

## 2018-12-30 MED ORDER — HEPARIN (PORCINE) IN NACL 1000-0.9 UT/500ML-% IV SOLN
INTRAVENOUS | Status: AC
  Filled 2018-12-30: qty 1000

## 2018-12-30 MED ORDER — FENTANYL CITRATE (PF) 100 MCG/2ML IJ SOLN
INTRAMUSCULAR | Status: DC | PRN
  Administered 2018-12-30: 50 ug via INTRAVENOUS

## 2018-12-30 MED ORDER — HEPARIN (PORCINE) IN NACL 1000-0.9 UT/500ML-% IV SOLN
INTRAVENOUS | Status: DC | PRN
  Administered 2018-12-30 (×2): 500 mL

## 2018-12-30 MED ORDER — LIDOCAINE HCL (PF) 1 % IJ SOLN
INTRAMUSCULAR | Status: DC | PRN
  Administered 2018-12-30: 15 mL via INTRADERMAL

## 2018-12-30 MED ORDER — CLOPIDOGREL BISULFATE 300 MG PO TABS
ORAL_TABLET | ORAL | Status: DC | PRN
  Administered 2018-12-30: 300 mg via ORAL

## 2018-12-30 MED ORDER — HEPARIN SODIUM (PORCINE) 1000 UNIT/ML IJ SOLN
INTRAMUSCULAR | Status: DC | PRN
  Administered 2018-12-30: 6000 [IU] via INTRAVENOUS

## 2018-12-30 MED ORDER — CLOPIDOGREL BISULFATE 75 MG PO TABS: 75.0000 mg | ORAL_TABLET | Freq: Every day | ORAL | 3 refills | Status: DC

## 2018-12-30 MED ORDER — HEPARIN SODIUM (PORCINE) 1000 UNIT/ML IJ SOLN
INTRAMUSCULAR | Status: AC
  Filled 2018-12-30: qty 1

## 2018-12-30 MED ORDER — MORPHINE SULFATE (PF) 10 MG/ML IV SOLN: 2.0000 mg | INTRAVENOUS | Status: DC | PRN

## 2018-12-30 MED ORDER — IODIXANOL 320 MG/ML IV SOLN
INTRAVENOUS | Status: DC | PRN
  Administered 2018-12-30: 10:00:00 118 mL via INTRA_ARTERIAL

## 2018-12-30 SURGICAL SUPPLY — 17 items
BALLN MUSTANG 5.0X40 135 (BALLOONS) ×2
BALLOON MUSTANG 5.0X40 135 (BALLOONS) IMPLANT
CATH OMNI FLUSH 5F 65CM (CATHETERS) ×1 IMPLANT
CLOSURE MYNX CONTROL 6F/7F (Vascular Products) ×1 IMPLANT
KIT ENCORE 26 ADVANTAGE (KITS) ×2 IMPLANT
KIT MICROPUNCTURE NIT STIFF (SHEATH) ×1 IMPLANT
KIT PV (KITS) ×2 IMPLANT
SHEATH PINNACLE 5F 10CM (SHEATH) ×1 IMPLANT
SHEATH PINNACLE 6F 10CM (SHEATH) ×1 IMPLANT
SHEATH PINNACLE ST 6F 45CM (SHEATH) ×1 IMPLANT
SHEATH PROBE COVER 6X72 (BAG) ×1 IMPLANT
STENT ELUVIA 6X40X130 (Permanent Stent) ×1 IMPLANT
SYR MEDRAD MARK V 150ML (SYRINGE) ×1 IMPLANT
TRANSDUCER W/STOPCOCK (MISCELLANEOUS) ×2 IMPLANT
TRAY PV CATH (CUSTOM PROCEDURE TRAY) ×2 IMPLANT
WIRE BENTSON .035X145CM (WIRE) ×1 IMPLANT
WIRE HI TORQ VERSACORE J 260CM (WIRE) ×1 IMPLANT

## 2018-12-30 NOTE — Discharge Instructions (Signed)
Femoral Site Care °This sheet gives you information about how to care for yourself after your procedure. Your health care provider may also give you more specific instructions. If you have problems or questions, contact your health care provider. °What can I expect after the procedure? °After the procedure, it is common to have: °· Bruising that usually fades within 1-2 weeks. °· Tenderness at the site. °Follow these instructions at home: °Wound care °· Follow instructions from your health care provider about how to take care of your insertion site. Make sure you: °? Wash your hands with soap and water before you change your bandage (dressing). If soap and water are not available, use hand sanitizer. °? Change your dressing as told by your health care provider. °? Leave stitches (sutures), skin glue, or adhesive strips in place. These skin closures may need to stay in place for 2 weeks or longer. If adhesive strip edges start to loosen and curl up, you may trim the loose edges. Do not remove adhesive strips completely unless your health care provider tells you to do that. °· Do not take baths, swim, or use a hot tub until your health care provider approves. °· You may shower 24-48 hours after the procedure or as told by your health care provider. °? Gently wash the site with plain soap and water. °? Pat the area dry with a clean towel. °? Do not rub the site. This may cause bleeding. °· Do not apply powder or lotion to the site. Keep the site clean and dry. °· Check your femoral site every day for signs of infection. Check for: °? Redness, swelling, or pain. °? Fluid or blood. °? Warmth. °? Pus or a bad smell. °Activity °· For the first 2-3 days after your procedure, or as long as directed: °? Avoid climbing stairs as much as possible. °? Do not squat. °· Do not lift anything that is heavier than 10 lb (4.5 kg), or the limit that you are told, until your health care provider says that it is safe. °· Rest as  directed. °? Avoid sitting for a long time without moving. Get up to take short walks every 1-2 hours. °· Do not drive for 24 hours if you were given a medicine to help you relax (sedative). °General instructions °· Take over-the-counter and prescription medicines only as told by your health care provider. °· Keep all follow-up visits as told by your health care provider. This is important. °Contact a health care provider if you have: °· A fever or chills. °· You have redness, swelling, or pain around your insertion site. °Get help right away if: °· The catheter insertion area swells very fast. °· You pass out. °· You suddenly start to sweat or your skin gets clammy. °· The catheter insertion area is bleeding, and the bleeding does not stop when you hold steady pressure on the area. °· The area near or just beyond the catheter insertion site becomes pale, cool, tingly, or numb. °These symptoms may represent a serious problem that is an emergency. Do not wait to see if the symptoms will go away. Get medical help right away. Call your local emergency services (911 in the [place] Do not drive yourself to the hospital. °Summary °· After the procedure, it is common to have bruising that usually fades within 1-2 weeks. °· Check your femoral site every day for signs of infection. °· Do not lift anything that is heavier than 10 lb (4.5 kg), or the   limit that you are told, until your health care provider says that it is safe. °This information is not intended to replace advice given to you by your health care provider. Make sure you discuss any questions you have with your health care provider. °Document Released: 04/29/2014 Document Revised: 09/08/2017 Document Reviewed: 09/08/2017 °Elsevier Interactive Patient Education © 2019 [employer] ° °

## 2018-12-30 NOTE — Progress Notes (Signed)
D/c instructions given to wife, Tedra Coupe, via telephone with interpreter Rosalyn Charters.  Patient present for d/c instructions as well.  No questions at this time and wife verbalized understanding of instructions

## 2018-12-30 NOTE — H&P (Signed)
   History and Physical Update  The patient was interviewed and re-examined.  The patient's previous History and Physical has been reviewed and is unchanged from recent office visit. Has continued ulceration of left foot with residual left sfa distal stenosis.   Eriverto Byrnes C. Donzetta Matters, MD [facility] Office: [phone] Pager: [phone]  12/30/2018, 8:10 AM

## 2018-12-30 NOTE — Progress Notes (Signed)
No bleeding or hematoma noted after ambulation 

## 2018-12-30 NOTE — Progress Notes (Signed)
Pt's CBG was 431.  Gwynne Edinger notified.  Orders received for insulin which was given. (see MAR) Cath lab ,Jan, notified os need for follow up CBG

## 2018-12-30 NOTE — Op Note (Signed)
    Patient name: Stanley Chaney MRN: [id number] DOB: 1961/03/09 Sex: male  12/30/2018 Pre-operative Diagnosis: Critical left lower extremity ischemia Post-operative diagnosis:  Same Surgeon:  [name], MD Procedure Performed: 1.  Ultrasound-guided cannulation right common femoral artery 2.  Aortogram bilateral lower extremity runoff 3.  Stent of left SFA with 6 x 9mm elluvia 4.  Minx device closure right common femoral artery 5.  Moderate sedation with fentanyl and Versed for 41 minutes  Indications: 58 year old male with history of uncontrolled diabetes previous smoker has had ulceration bilateral feet.  He now has an area on his left foot that is being treated by podiatry has evidence of very tight stenosis of left SFA distally.  He has not been on Plavix.  He did quit smoking.  He is indicated for angiogram possible invention left lower extremity.  Findings: There is may be a 30% stenosis of the distal aorta with similar into the left common iliac artery.  The right lower extremity SFA is patent has three-vessel runoff on the right.  Left SFA has multifocal areas of calcification distally there is a 90% stenosis.  After stenting and balloon dilatation there is 0% residual stenosis with runoff to the foot via the posterior tibial is the dominant vessel and the peroneal artery also.  Anterior tibial artery does not appear to make it to the foot.   Procedure:  The patient was identified in the holding area and taken to room 8.  The patient was then placed supine on the table and prepped and draped in the usual sterile fashion.  A time out was called.  Ultrasound was used to evaluate the right common femoral artery which is noted to be patent although there was posterior plaque.  There is anesthetized 1% lidocaine cannulated with micropuncture needle followed by wire sheath with direct ultrasound visualization.  An image was saved the permanent record.  We placed a Bentson wire followed by  5 [place] sheath.  Omni cath was placed to level 1 aortogram was performed followed by bilateral extremity runoff with the above findings.  We crossed bifurcation with Omni catheter Bentson wire placed a long 6 French sheath into the left SFA patient was fully heparinized.  Using a 5 mm balloon is a catheter as well as a versa core wire we were able to traverse the 90% stenosis and pre-dilated this abdominal pressure.  A 6 x 40 mm drug-eluting stent was then placed.  Please angiography demonstrated no residual stenosis with peroneal and posterior tibial artery runoff to the foot.  Satisfied with this we removed our sheath exchanged for short 6 French sheath placed a minx device for closure.  He tolerated this procedure without immediate complication.  300 mg of Plavix was administered at completion.  Contrast: 118 cc    Hopie Pellegrin C. Donzetta Matters, MD [facility] Office: [phone] Pager: [phone]

## 2018-12-31 ENCOUNTER — Encounter (HOSPITAL_COMMUNITY): Payer: Self-pay | Admitting: Vascular Surgery

## 2019-01-04 ENCOUNTER — Other Ambulatory Visit: Payer: Self-pay

## 2019-01-04 ENCOUNTER — Ambulatory Visit (INDEPENDENT_AMBULATORY_CARE_PROVIDER_SITE_OTHER): Payer: BLUE CROSS/BLUE SHIELD | Admitting: Podiatry

## 2019-01-04 ENCOUNTER — Encounter: Payer: Self-pay | Admitting: Podiatry

## 2019-01-04 VITALS — Temp 98.1°F

## 2019-01-04 DIAGNOSIS — I739 Peripheral vascular disease, unspecified: Secondary | ICD-10-CM

## 2019-01-04 DIAGNOSIS — I70245 Atherosclerosis of native arteries of left leg with ulceration of other part of foot: Secondary | ICD-10-CM

## 2019-01-04 DIAGNOSIS — E0843 Diabetes mellitus due to underlying condition with diabetic autonomic (poly)neuropathy: Secondary | ICD-10-CM

## 2019-01-04 DIAGNOSIS — L97522 Non-pressure chronic ulcer of other part of left foot with fat layer exposed: Secondary | ICD-10-CM

## 2019-01-14 NOTE — Progress Notes (Signed)
   HPI: 58 year old male presents the office today for follow-up evaluation regarding ulcers to the left foot secondary to diabetes mellitus.  He says that the ulcers are doing very well.  He believes that they are healed.  No new complaints at this time  Past Medical History:  Diagnosis Date  . Diabetes mellitus without complication ([place])   . PAD (peripheral artery disease) ([place])      Physical Exam: General: The patient is alert and oriented x3 in no acute distress.  Dermatology: Skin is warm, dry and supple bilateral lower extremities. Negative for open lesions or macerations.  Ulcerations noted to the left foot have healed.  Complete reepithelialization has occurred.  Vascular: Diminished pedal pulses bilaterally. No edema or erythema noted. Capillary refill within normal limits.  Neurological: Epicritic and protective threshold absent bilaterally.   Musculoskeletal Exam: Range of motion within normal limits to all pedal and ankle joints bilateral. Muscle strength 5/5 in all groups bilateral.   Assessment: 1.  Ulcerations left foot x2 secondary to diabetes mellitus-healed 2.  PVD 3.  Diabetes mellitus w/ peripheral neuropathy   Plan of Care:  1. Patient evaluated.  2.  Light debridement of the healed ulcerations was performed to remove some of the hyperkeratotic callus tissue. 3.  Recommend the patient wear good supportive shoe gear 4.  Stressed the importance of controlling diabetes blood glucose levels and close management by his feet PCP 5.  Return to clinic as needed      Edrick Kins, DPM [facility]  Dr. Edrick Kins, DPM    [address], [place] [address]                Office [phone]  Fax [phone]

## 2019-01-15 ENCOUNTER — Telehealth: Payer: Self-pay | Admitting: Vascular Surgery

## 2019-01-15 NOTE — Telephone Encounter (Signed)
sch appt lvm mld ltr 02/19/2019 10am ABI 11am Bil LE 1145am p/o NP

## 2019-01-15 NOTE — Telephone Encounter (Signed)
-----   Message from [name], MD sent at 12/30/2018  9:49 AM EDT ----- Stanley Chaney [id number] 1961-01-25  12/30/2018 Pre-operative Diagnosis: Critical left lower extremity ischemia  Surgeon:  [name], MD  Procedure Performed: 1.  Ultrasound-guided cannulation right common femoral artery 2.  Aortogram bilateral lower extremity runoff 3.  Stent of left SFA with 6 x 25mm elluvia 4.  Minx device closure right common femoral artery 5.  Moderate sedation with fentanyl and Versed for 41 minutes  F/u in 6-8 weeks with abi and ble duplex. Can be with me/np/pa  Erlene Quan

## 2019-02-17 ENCOUNTER — Other Ambulatory Visit: Payer: Self-pay

## 2019-02-17 DIAGNOSIS — I779 Disorder of arteries and arterioles, unspecified: Secondary | ICD-10-CM

## 2019-02-19 ENCOUNTER — Other Ambulatory Visit: Payer: Self-pay

## 2019-02-19 ENCOUNTER — Encounter: Payer: BC Managed Care – PPO | Admitting: Family

## 2019-02-19 ENCOUNTER — Ambulatory Visit (HOSPITAL_COMMUNITY)
Admission: RE | Admit: 2019-02-19 | Discharge: 2019-02-19 | Disposition: A | Discharge status: Home / Self Care | Payer: BC Managed Care – PPO | Source: Ambulatory Visit | Attending: Family | Admitting: Family

## 2019-02-19 DIAGNOSIS — I779 Disorder of arteries and arterioles, unspecified: Secondary | ICD-10-CM

## 2019-02-23 ENCOUNTER — Telehealth (HOSPITAL_COMMUNITY): Payer: Self-pay | Admitting: Rehabilitation

## 2019-02-23 NOTE — Telephone Encounter (Signed)
The above patient or their representative was contacted and gave the following answers to these questions:         Do you have any of the following symptoms? No Fever                    Cough                   Shortness of breath  Do  you have any of the following other symptoms? No  muscle pain         vomiting,        diarrhea        rash         weakness        red eye        abdominal pain         bruising         bleeding              joint pain           severe headache  Have you been in contact with someone who was or has been sick in the past 2 weeks? No Yes                 Unsure                         Unable to assess   Does the person that you were in contact with have any of the following symptoms?  Cough         shortness of breath           muscle pain         vomiting,            diarrhea            rash            weakness           fever            red eye           abdominal pain          bruising  or  bleeding                joint pain                severe headache             Have you  or someone you have been in contact with traveled internationally in the last month?  No      If yes, which countries?  Have you  or someone you have been in contact with traveled outside [place] in the last month?  No      If yes, which state and city?  COMMENTS OR ACTION PLAN FOR THIS PATIENT:    

## 2019-02-24 ENCOUNTER — Ambulatory Visit (HOSPITAL_COMMUNITY)
Admission: RE | Admit: 2019-02-24 | Discharge: 2019-02-24 | Disposition: A | Discharge status: Home / Self Care | Payer: BC Managed Care – PPO | Source: Ambulatory Visit | Attending: Family | Admitting: Family

## 2019-02-24 ENCOUNTER — Ambulatory Visit (INDEPENDENT_AMBULATORY_CARE_PROVIDER_SITE_OTHER): Payer: BC Managed Care – PPO | Admitting: Family

## 2019-02-24 ENCOUNTER — Encounter: Payer: Self-pay | Admitting: Family

## 2019-02-24 ENCOUNTER — Other Ambulatory Visit: Payer: Self-pay

## 2019-02-24 ENCOUNTER — Ambulatory Visit (INDEPENDENT_AMBULATORY_CARE_PROVIDER_SITE_OTHER)
Admission: RE | Admit: 2019-02-24 | Discharge: 2019-02-24 | Disposition: A | Discharge status: Home / Self Care | Payer: BC Managed Care – PPO | Source: Ambulatory Visit | Attending: Family | Admitting: Family

## 2019-02-24 VITALS — BP 145/76 | HR 62 | Temp 97.9°F | Resp 14 | Ht 68.0 in | Wt 150.0 lb

## 2019-02-24 DIAGNOSIS — I779 Disorder of arteries and arterioles, unspecified: Secondary | ICD-10-CM

## 2019-02-24 DIAGNOSIS — Z87891 Personal history of nicotine dependence: Secondary | ICD-10-CM

## 2019-02-24 DIAGNOSIS — E1151 Type 2 diabetes mellitus with diabetic peripheral angiopathy without gangrene: Secondary | ICD-10-CM

## 2019-02-24 DIAGNOSIS — E1165 Type 2 diabetes mellitus with hyperglycemia: Secondary | ICD-10-CM

## 2019-02-24 DIAGNOSIS — IMO0002 Reserved for concepts with insufficient information to code with codable children: Secondary | ICD-10-CM

## 2019-02-24 NOTE — Patient Instructions (Addendum)
???? ??? ?????? ?? ??????? Steps to Quit Smoking  ??????? ?????? ????? ???? ??????? ??? ?????? ???????? ? ????? ??????????? ????? ??? ?????? ?????????. ??????? ?????????? ??? ? ?????????? ????? ????? ???????? ?????? ????????? ??????????? ???????????. ??????? ?????? ??????, ?? ??? ?????????, ??? ?? ?????? ??????? ??? ?????? ????????. ??????? ?? ?????? ??????? ??????. ?????? ???????????? ?????? ?? ???????? ???? ?? ???????? ??????, ? ??? ????????? ???? ???????? ??????? ??????????? ? ?????????, ????? ???:  ??? ?????? ??? ??????? ??????, ????? ??? ???? (??????????? ????????????? ??????????? ??????).  ??????????? ??????.  ????????.  ?????????? ????????.  ?????????.  ?????????? ? ???????? ??????. ????? ????, ????? ?? ??????? ??????, ????? ?????????? ????? ????????, ??? ??????, ????????? ??????? ? ??????. ?? ????? ?????? ????????, ??? ????? ???? ??????, ?????? ??? ???????? ???? ????? ??????????? ? ????????? ? ??????????. ???? ?? ?????????, ????? ?? ??????? ???????? ??????? ???? ???????? ??????? ? ?????? ?????? ????. ??? ????????????? ? ?????? ?? ???????? ????? ?? ?????? ?????????? ?? ???????, ???????? ????, ?????? ???????? ?? ?????? ???????, ??? ?????????? ??????. ????? ??????? ?? ???????:  ???????? ???? ?????? ?? ???????. ?????????? ???? ? ???????? ????????? ???? ??????, ??????? ?????, ??????? ???????? ??? ?????????????.  ???????? ???????, ?????? ?? ???????? ??????. ??????? ???? ?????? ?? ?????? ??? ??? ?????, ????????, ?? ??????? ? ??????, ? ????? ?????????? ??? ? ?????????.  ?????????? ?????, ?????, ???? ? ????????, ??????? ???????? ? ??? ??????? ?????? (????????????? ???????), ? ????????? ??. ?????????? ????????? ????????? ????????: ? ????????? ??? ???????? ? ???? ????, ?? ?????? ? ? ??????????. ? ????????? ??????????? ?????????????? ???? ????????? ? ?????????. ? ???????? ???? ?????????? ? ??????????. ? ????????? ?????? ? ????, ? ??? ????? ??????? ????? ? ?????.  ???????? ?????  ?????????????, ???????, ???????? ?? ??????, ??? ?? ???????? ??????. ????????? ?? ??????? ????? ??????? ????? ????????? ????? ?? ???????.  ?????????? ?? ????? ?????? ? ????????? ?????? ?? ???????.  ???????, ????? ???????? ??????? ??????????? ????? ??????????? ??????????. ????? ????????? ????? ???????????? ??? ?????? ?? ???????? ?????????? ?? ????? ?????? ? ????????? ?????????? ?????? ?? ???????. ????? ??? ????? ???? ?????????:  ??????? ?????? ????? ?????? ???????????? ?????????? ?????????? ????????????? ?????? ? ??????? ????????????? ??????? ???????. ???????????? ??????????, ??? ????? ????? ????? ???????? ????? ????????, ??? ??????????? ????? ?? ???????.  ????????? ????? ????????????, ?????????? ??? ?????????? ?????? ??????? ???????. ? ??? ????? ?????? ?????? ?? ?????, ???? ?? ??????? ?? ????????? ????????????. ???? ???????? 10-???????? ???????????? ????? ???? ????????????.  ??????????? ??????? ? ??????? ?????????, ????????? ?????? ??? ??????? ?????? ? ?????????? ??? ????????? ???? ????? ????, ??? ?? ??????? ??????. ???????? ??????? ???????? ??????? ??? ?? ?????? ?????????????. ????? ??? ????? ????: ? ?????? ???? ? ?????????????. ? ?????????? ????? ??? ????????? ??????. ? ???????? ????????? ??? ??????????. ? ?????? ????????? ??? ????????? ????????????. ? ????????? ???????? ????????? ?????????. ? ?????????? ??? ????????? ?????????.  ????? ????????, ?????????? ??????? ??????. (???? ?? ????????? ??? ??????? ??????, ?????????????? ?????????? ? ????? ??????? ??????.) ????????? ????????? ???????? ???????, ? ????????? ?? ????????. ??? ???? ???????? ???????? ?????????? ???????????, ?? ?????????, ?????????? ???????, ???????????? ?????????? ????????? ???????????. ??????? ???? ????? ???????????????: ? ??????????? ????????, ??????????? ??????? ??? ???????. ? ??????????? ?????????? ??? ?????. ? ?????????????? ?????????, ??????????? ??????. ?????????? ?? ????? ?????? ? ????????? ????????? ?????????,  ???????? ????? ???????? ? ????????? ? ?????? ??????????????. ?????????? ?? ???? ???? ????????? ???? ??? ?????? ?????? ?? ?????, ??? ?????????? ???? ????????? ??-???????????. ???? ?? ????????? ??? ??????? ??????, ?????????? ? ????? ??????? ?????? ? ?????? ???????????? ??? ???? ????????? ????????? ? ?????? ?? ???????. ?? ??????? ????????? ?????????, ?????????? ??????? ??????, ???? ?????? ??? ?? ???????? ????? ??????? ??????. ??? ????? ???????, ????? ????? ??????? ??????? ????? ?? ??????? ??????? ????? ????????????? ???????????, ?? ???? ????? ???????? ????????? ???? ???????. ???????? ?? ?????????? ????????? ????????:  ?????????? ? ????? ????? ? ??????? ? ???????? ???????????? ? ???????? ??? ? ??? ?????. ????????? ?? ???????? ????????? ?????? ???????, ?????????? ? ??????????????? ?????? ????????? ??? ? ????????????.  ??????? ??????? ????? ?? ?????? ????? ???.  ????????? ????????? ????, ?????????? ? ??? ??????? ??????, ????? ??? ????, ????????? ??? ??????? ?? ??????.  ????????? ????? ? ????? ????????? ?????.  ?????????? ???????? ?????????? ????????, ?????? ??? ? ????????? ????? ?????? ????? ????????? ??????? ??????. ????? ????????? ??????, ??????????: ? ?????????? ?????????? ?????????? ??????????. ? ??????????? ??????????. ? ??????? ?????. ? ?????????????. ? ????????? ????????? ???????????? ?????? ????. ??? ????????, ?????? ?????, ?????? ????? ????? ???? ? ?????? ?? ???, ???????, ????? ????? ???? ???????? ?????????. ??????????????? ?????????? ????? ? ???? ????????.  ???????? ????????? (?????????? ) ??? ???????? ??? ???????? ??? ?????? ????????? ???????, ??????? ??????? ??? ?????????????? ?????? ????? ?????? ?? ???????, ???????????? ???????????, ?????? ? ?????????. ?????????? ????????? ?????????? ??????????, ????? ???  Quick Guide ?? CDC (??????? ?? ???????? ? ???????????? ??????????? ???). ??? ?????? ?? ??????? ?? ?????? ????? ? ?????? ????????? (??????? ???????) ?? ????? smokefree.gov ? ??????  ???-??????. ??? ? ???? ???? ???????????, ????? ????? ??????? ? ??????? ?????? 24 ????? ????? ?????? ?? ??????? ? ??? ????? ????????? ????????? ???????? ???????????. ??? ???????? ??? ??????? ???????? ???????? ????? 2-3 ??? ????? ????, ??? ?? ??????? ??????, ?? ??? ?????? ?? ?????? ????? 2-3 ??????. ????? ??????????? ????????? ??? ????????? ????? ????:  ???????? ??????????.  ?????????????, ???????????? ??? ???????????.  ????????? ??? ???????????? ????????.  ??????????????.  ??????? ??????? ? ???????????? ??????? ????????? ? ?????????? ? ????????.  ????????? ?????????? ????? ????.  ?????.  ???????.  ?????? ??? ???? ? ?????.  ????????? ? ???????? ??????????? ???? ????????.  ??????????? ??????????.  ???????? ?? ???? (??????????). ?? ?????????? ?????? 2-3 ?????? ?????? ?? ??????? ?? ??????? ???????? ????? ?????????? ??????????, ????? ???:  ????????? ???????? ? ???????? ????????.  ?????????? ????? ? ???? ? ?????.  ?????????? ?????????? ?????.  ???????? ????????????? ????????.  ???????? ????.  ??????????? ?????? ?????.  ?????????? ?????????? ????, ????? ?? ??????. ????? ?? ??????? ??? ??????????? ????? ???????? ?????? ??????? ???????. ?? ??????? ??????????????, ???? ??? ??? ?? ??????? ? ??????? ????. ????????? ????? ????? ??????? ????? ??????? ?????? ?? ???????, ?????? ??? ??? ????????? ?????? ? ???????????? ???????????. ??????? ??? ????????? ??? ?????????? ?????? ????? ??????? ??????, ? ?????????? ? ????? ??????, ???? ? ??? ??????? ?????-???? ??????? ??? ????????. ??? ?????????? ?? ????? ???????? ??????, ??????????????? ????? ??????. ??????????? ???????? ????? ???????????? ??? ??????? ? ????? ??????? ??????. Document Released: 12/11/2006 Document Revised: 05/06/2017 Document Reviewed: 01/10/2015 Elsevier Interactive Patient Education  2019 [employer].      ??????????? ?????????????? ??????? Peripheral Vascular Disease ??????????? ?????????????? ??????? (???)-- ???  ??????????? ??????????? ???????. ????? ??????? ???????? ???-- ?????? ??????????????. ? ??????????? ??????? ??? ???????? ? ??????? ??????????? ???????, ?? ??????? ????? ??????????? ?? ?????? ? ?????? ?????? ?????????. ??? ????? ???????? ? ?????????????? ?????????????? ???, ??? ? ?????????? ???????, ????????, ??????? ??? ?????. ??? ?? ?????, ???? ????? ????? ??????????? ???????? ?????? ??????? ??? ? ?????. ?????????? ??? ???? ???:  ???????????? ???. ??? ???????? ???????????????? ???. ????? ??????????? ????????? ? ?????????? ??????????? ????????? ??????????? ???????.  ?????????????? ???. ????? ??????????? ????????? ? ?????????? ?????????, ??????? ???????? ? ?????? ? ??????? ??????????? ??????? (??????). ??? ??????? ??? ?????? ???????????? ? ???????? ???????. ??? ????? ????? ????????? ? ?????? ?????? ???????????. ??? ?????????, ??????? ??????????????? ????????? ????????? ?????????????? ? ???? ??? ????. ??? ?????????? ?????????. ?????? ????????  ??? ??????? ???? ????? ????????? ?? ???? ????????? ??????. ???????? ???????????????? ???????? ????????????? ??? ???????? ????????? ??????? ???????? (??????) ?????? ??????? ????????, ?.?. ????????????. ????????? ????? ?????? ????? ?????????? ?? ?????? ??????????? ??????? ? ???????? ? ??????? ??????? ????????. ??? ???????? ? ???????????? ????????? ? ????? ???????? ?????? ?????? ???????????. ??????? ????????????????? ????????? ??? ????????:  ???????? ???????, ???????????? ?????? ??????????? ???????.  ??????????? ??????????? ???????.  ???????????, ??????? ????? ????????? ? ?????????? ??? ??????? ??????????? ???????.  ????? ????? ? ???????, ????????????? ??????????? ????????????? ???. ??? ???????? ????? ??????????? ????????? ? ??? ????? ????????? ????? ???? ????, ????:  ? ??? ??????????? ???????? ??????? ?? ???.  ? ??? ??????? ???????????? ??????????? ?????????, ? ??? ?????: ? ??????? ??????? ??????????? ? ?????. ? ???????? ??????. ? ??????????  ???????????? ???????? (???????????). ? ??????????? ??????? ??????. ? ?????? ??????????? ???????, ????????? ? ????????? ?????????, ? ????????. ? ?????? ?????, ????????, ?????? ??? ?????????, ? ????????. ????? ?????? ????? ????????? ? ??????????? ??????????? ??????? ? ???????????. ? ??????? ???????. ??? ??????? ????????? ??-?? ?????????? ??????????? ??????? ? ?????? ? ???????. ? ????????? ????? ???????. ? ?????? ?????????? ??????, ??????? ???????? ??????? ? ?????. ? ??????????? ?????.  ?? ???????????? ????? ??? ??????.  ? ??? ???????????? ?????????? ????????.  ? ??? ????????.  ???  50 ??? ??? ??????. °?????? ???????? ??? ????????? °??? ????????? ????? ????????? ? ????????????? ????????? ?????????. ???? ???????? ??????? ?? ????, ????? ????? ????????? ?? ???????? ???????????? ?????????? ?????. ???????????????? ???????? ? ???????? ???????? ?????????: °· ?????? ? ?????? ???????? ???. ??? ????? ???? ????????? ?????????????? ?????????????? ? ????? (????????????? ????????? ??????????????). °· ???? ? ???????? ? ?????. ??? ???????? ????????? ?? ????? ?????????? ?????????? ? ???????? ?? ????? ?????? (?????????????? ???????????? ????????? ??????????????). °· ???? ? ????? ?? ????? ??????. °· ????????, ??????????? ??? ???????? ? ?????. °· ??????????? ???? ??? ??????, ? ??????????? ?? ????????? ? ?????? ?????. °· ?????????, ?????????? ???? ??? ?????. ??? ????? ????: °? ????????? ?????. °? ?????? ????. °? ????????? ??? ??????????? ??????? ????. °? ?????????? ????? ?? ?????. °· ????????????? ? ??????? ??? ?? ??????????? (??????????? ??????????). °· ?????????? ????????????. °? ????? ? ??? ???? ?????? ??????????? ???? ? ?????? ?? ????????, ?????? ??? ?????. ?????????? ????? ??? ? ??????? ????? ??????? ?????? ????????. °??? ??? ?????????????? °??? ????????? ??????????????? ?? ?????????: °· ????????????? ? ??? ????????? ? ?????????. °· ??????????? ???????????? ???????????? ? ?????? ?????? ????????????  ????????. °· ??????????? ?????? ????????????, ??????????? ????????, ??? ??????? ? ????????????? ? ??? ???, ? ????? ?????????? ??????? ??????? ???????????. ???????????? ????? ????????: °? ????????? ????????????? ???????? ? ????? ????? ? ?????, ? ????? ????????????? ?????? ?????? (?????????? ???????????????). °? ???????????????? ???????????? ? ?????????????? ???????? ???? ??? ????????? ??????????? ??????????? ????? ????? ???? ??????????? ?????? (??????????????????). °? ???????? ? ???? ??????????? ?????? ????????????? ???????? ????? ??????????? ???????????????? ???????????? ? ??????????????: °§ ????????????? ????? (??????????? ??? ?????????????). °§ ???????????? ??????????? ????????????? ????? (??-???????????). °§ ?????????? ? ??????? ????????????????? ???? (????????-??????????? ??????????? ??? ???). °??? ??? ???????? °??????? ??? ??????? ?? ??????? ?????? ????????? ? ??????????? ????????? ? ??? ?????????. ????? ??? ??????? ?? ?????? ????????. ?????????, ??????????????? ????????????? ????? ???????????, ?????????? ?????? ? ??????????????. ? ??? ????? ?????????? ???????????? (???????????) ?????????, ????? ??? ???????? ??????, ?????????? ??????? ??????????? ? ?????????? ???????????? ????????. ??????? ????????: °· ????????? ?????? ?????, ????????: °? ????? ?? ???????. °? ?????????? ?????????? ?????????? ??????????. °? ?????????? ????? ? ?????? ??????????? ????? ? ???????????. °· ????? ????????????? ??????????, ????? ???: °? ????????? ??? ?????????? ?????, ??????????????? ?????????????????. °? ????????? ??? ????????? ?????????. °? ????????? ??? ??????????????? ?????? ??????????? ? ?????. °· ????????????? ?????????, ????? ???: °? ?????????, ?????????????? ????????????? ?????????? ???????? ??? ?????????? ????????? ??????? ? ????????? ????????? (?????????????). °? ?????????, ?????????????? ????????? ?????????????? ????????? ??????????????? ??????? (??????) ??? ??????????? ???????????? ??????? ? ????????  ?????????. °? ????????????? ???????? ??? ??????????????? ????????? ? ????? ??????????????? ??????? (???????????? ?????????????? ???????). °? ????????????? ???????? ??? ???????? ???????? ????? ?? ?????????????? ???? ?? ?????????? ??????????. °? ?????????. ??? ????????????? ???????? ?????????? ??????????. ????? ????????????? ????? ????????????? ? ??? ??????, ???? ?????? ?????? ?????????? ?? ??????? ????????? ??? ?????? ??????????????? ??? ????????????? ??????? ???????. °? ???????? ???????? ???????? ????? ???????????: °????? ????? °· ?? ???????????? ????????, ?????????? ???????, ??? ???????? ???????, ????? ??? ???????? ? ??????????? ????????. ???? ??? ?????? ?? ??????? ??? ????? ??????, ?????????? ? ?????? ???????? ?????. °· ? ?????? ??????? ???? ???????? ??? ? ??????????? ???????? ???, ?????????????? ??????? ??? ?? ????? ??????? ??????. °· ??????????????? ????? ? ?????? ??????????? ????? ? ???????????. ???? ??? ????? ??????, ?????????? ? ?????? ???????? ?????. °· ????????? ????????????. ???????? ? ?????? ???????? ?????, ????? ??? ??????? ??? ????????. °????? ???????? °· ?????????? ?????????????? ? ??????????? ????????????? ????????? ?????? ? ???????????? ? ?????????? ?????? ???????? ?????. °· ?????????? ?????????? ??????? ? ????? ???????. °? ?????? ?????????? ????? ??????????? ???????. °? ????????? ?????????? ???? ?????? ?? ??????? ??????? ??? ???????. °· ????????? ?? ??? ??????????? ??????, ??? ??????? ????? ??????? ??????. ??? ?????. °?????????? ? ???????? ?????, ????: °· ? ??? ????????? ?????? ? ????? ?? ????? ??????. °· ? ??? ????????? ???? ? ????? ?? ????? ??????. °· ? ??? ??????????? ??????????? ???? ??? ??????. °· ? ??? ??????????? ????????? ????. °· ? ??? ??????????? ??????????. °· ? ??? ?? ??????? ??????? ???????????? ?????? ??? ??????. °?????????? ?????????? ?? ???????, ????: °· ???? ???? ??? ???? ?????????? ????????, ?????? ? ??????????? ????????? ???????. °· ???? ???? ??? ???? ????????, ???????, ???????,  ?????????? ??????????? ??? ??????. °· ? ??? ????????? ???? ? ????? ??? ???????????? ???????. °· ? ??? ????????? ????????? ???????? ???????? ? ????, ???? ??? ????. °· ?? ???????? ?????????? ??????????? ???????? ??? ??????? ??????????? ????????. °· ? ??? ???????? ????????? ????? ??????? ???????? ???? ??? ????????? ??????. °?????? °· ??????????? ?????????????? ??????? (???) -- ??? ??????????? ??????????? ???????. °· ? ??????????? ??????? ??? ???????? ? ??????? ??????????? ???????, ?? ??????? ????? ??????????? ?? ?????? ?????? ? ?????? ?????? ?????????. °· ??? ????? ????????? ? ????????????? ????????? ?????????. ???? ???????? ??????? ?? ????, ????? ????? ????????? ?? ???????? ???????????? ?????????? ?????. °· ??????? ??? ??????? ?? ??????? ????????? ? ??????????? ????????? ?????????. °??? ?????????? ?? ????? ???????? ??????, ??????????????? ????? ??????. ??????????? ???????? ????? ???????????? ??? ??????? ? ????? ??????? ??????. °  Document Released: 01/12/2009 Document Revised: 03/03/2017 Document Reviewed: 03/03/2017 Elsevier Interactive Patient Education  2019 [employer].

## 2019-02-24 NOTE — Progress Notes (Signed)
[facility] [place]   CC: Follow up peripheral artery occlusive disease  History of Present Illness Stanley Chaney is a 58 y.o. male who is s/p stent placement of left SFA with 6 x 75m elluvia on 12-30-18 by Dr. CDonzetta Mattersfor critical left lower extremity ischemia.  He is also s/p atherectomy of right SFA and drug-coated balloon angioplasty of right SFA on 07-20-18 by Dr. CDonzetta Mattersfor critical right lower extremity ischemia.  Pt had undergone a drug-coated balloon angioplasty of his left SFA on 06-15-18 by Dr. CDonzetta Mattersfor a wound on his foot. He also had a dry ulcer on his right foot.  Pt returns today for follow up surveillance.  He states that his left foot ulcers have healed since the above procedure. He has no signs of ischemia in either lower extremity.   He walks a dog for a total to 3-4 hours daily with no claudication symptoms.   He denies any known history of stroke or TIA.  [facility].   The last time he saw his PCP was in March 2020 for chest pain. He is followed by ortho for bilateral shoulder pain. In March of 2020 his troponin was 0, ECG showed NSR with 2 paired ventricular complexes.   Diabetic: Yes, 9.8 A1C on 09-14-18, uncontrolled; normal GFR and serum creatinine as of 11-12-18 Tobacco use: former smoker, quit 06-14-18, however he resumed and quit 3 weeks ago  Pt meds include: Statin :Yes Betablocker: No ASA: Yes Other anticoagulants/antiplatelets: Plavix  Past Medical History:  Diagnosis Date  . Diabetes mellitus without complication ([facility]   . PAD (peripheral artery disease) ([facility])     Social History Social History   Tobacco Use  . Smoking status: Former Smoker    Quit date: 06/14/2018    Years since quitting: 0.6  . Smokeless tobacco: Never Used  . Tobacco comment: pt reports quitting 6 months ago  Substance Use Topics  . Alcohol use: Yes    Alcohol/week: 18.0 standard drinks    Types: 18 Cans of beer per  week    Comment: pt reports not drinking for past 6 months  . Drug use: Never    Family History Family History  Problem Relation Age of Onset  . Diabetes Mother   . Cancer Mother     Past Surgical History:  Procedure Laterality Date  . ABDOMINAL AORTOGRAM W/LOWER EXTREMITY Bilateral 06/15/2018   Procedure: ABDOMINAL AORTOGRAM W/LOWER EXTREMITY;  Surgeon: CWaynetta Sandy MD;  [facility];  Service: Cardiovascular;  Laterality: Bilateral;  . ABDOMINAL AORTOGRAM W/LOWER EXTREMITY Left 12/30/2018   Procedure: ABDOMINAL AORTOGRAM W/LOWER EXTREMITY;  Surgeon: CWaynetta Sandy MD;  [facility];  Service: Cardiovascular;  Laterality: Left;  . I&D EXTREMITY Left 06/12/2018   Procedure: Excisional Debridement left foot;  Surgeon: GDorna Leitz MD;  Location: [facility];  Service: Orthopedics;  Laterality: Left;  . LOWER EXTREMITY ANGIOGRAM Right 07/20/2018     Right lower extremity angiogram  . LOWER EXTREMITY ANGIOGRAPHY Right 07/20/2018   Procedure: Lower Extremity Angiography;  Surgeon: CWaynetta Sandy MD;  [facility];  Service: Cardiovascular;  Laterality: Right;  . PERIPHERAL VASCULAR ATHERECTOMY Left 06/15/2018   Procedure: PERIPHERAL VASCULAR ATHERECTOMY;  Surgeon: CWaynetta Sandy MD;  [facility];  Service: Cardiovascular;  Laterality: Left;  SFA  . PERIPHERAL VASCULAR BALLOON ANGIOPLASTY Left 06/15/2018   Procedure: PERIPHERAL VASCULAR BALLOON ANGIOPLASTY;  Surgeon: [name], MD;  Location: [facility];  Service: Cardiovascular;  Laterality: Left;  SFA  . PERIPHERAL VASCULAR BALLOON ANGIOPLASTY Right 07/20/2018   Procedure: PERIPHERAL VASCULAR BALLOON ANGIOPLASTY;  Surgeon: [name], MD;  Location: [facility];  Service: Cardiovascular;  Laterality: Right;  SFA WITH DRUG COATED   . PERIPHERAL VASCULAR INTERVENTION Left 12/30/2018    Procedure: PERIPHERAL VASCULAR INTERVENTION;  Surgeon: [name], MD;  Location: [facility];  Service: Cardiovascular;  Laterality: Left;  SFA    No Known Allergies  Current Outpatient Medications  Medication Sig Dispense Refill  . aspirin 81 MG EC tablet Take 1 tablet (81 mg total) by mouth daily. 90 tablet 1  . atorvastatin (LIPITOR) 10 MG tablet Take 1 tablet (10 mg total) by mouth daily at 6 PM. 30 tablet 11  . blood glucose meter kit and supplies Dispense based on patient and insurance preference. Use up to four times daily as directed. (FOR ICD-10 E10.9, E11.9). 1 each 0  . clopidogrel (PLAVIX) 75 MG tablet Take 1 tablet (75 mg total) by mouth daily. 30 tablet 3  . glipiZIDE (GLUCOTROL) 5 MG tablet Take 1 tablet (5 mg total) by mouth daily before breakfast. 90 tablet 1  . Lancets MISC Inject 1 each into the skin 3 (three) times daily as needed. 100 each 3  . lisinopril (PRINIVIL,ZESTRIL) 10 MG tablet Take 1 tablet (10 mg total) by mouth daily. 90 tablet 1  . metFORMIN (GLUCOPHAGE) 500 MG tablet Take 1 tablet (500 mg total) by mouth 2 (two) times daily with a meal. (Patient taking differently: Take 500 mg by mouth 2 (two) times daily. ) 180 tablet 1  . terbinafine (LAMISIL) 250 MG tablet Take 1 tablet (250 mg total) by mouth daily. 90 tablet 0   No current facility-administered medications for this visit.     ROS: See HPI for pertinent positives and negatives.   Physical Examination  Vitals:   02/24/19 1441 02/24/19 1444  BP: (!) 148/80 (!) 145/76  Pulse: 62   Resp: 14   Temp: 97.9 F (36.6 C)   TempSrc: Temporal   SpO2: 100%   Weight: 150 lb (68 kg)   Height: '5\' 8"'$  (1.727 m)    Body mass index is 22.81 kg/m.  General: A&O x 3, WDWN, slender fit appearing male. [place] speaking, interpreter with him.  Gait: normal HENT: No gross abnormalities.  Eyes: Pupils are equal. Pulmonary: Respirations are non labored, CTAB, adequate air movement in all  fields Cardiac: regular rhythm, no detected murmur.         Carotid Bruits Right Left   Negative Negative   Radial pulses are 1+ alpable bilaterally   Adominal aortic pulse is faintly palpable on expiration only               VASCULAR EXAM: Extremities without ischemic changes, without Gangrene; without open wounds.  LE Pulses Right Left       FEMORAL  2+ palpable  2+ palpable        POPLITEAL  2+ palpable   1+ palpable       POSTERIOR TIBIAL  1+ palpable   1+ palpable        DORSALIS PEDIS      ANTERIOR TIBIAL not palpable  not palpable    Abdomen: soft, NT, no palpable masses. Skin: no rashes, no cellulitis, no ulcers noted. Musculoskeletal: no muscle wasting or atrophy.  Neurologic: A&O X 3; appropriate affect, Sensation is normal; MOTOR FUNCTION:  moving all extremities equally, motor strength 5/5 throughout. Speech is fluent/normal. CN 2-12 intact. Psychiatric: Thought content is normal, mood appropriate for clinical situation.     ASSESSMENT: Stanley Chaney is a 58 y.o. male who is s/p stent placement of left SFA with 6 x 50m elluvia on 12-30-18 by Dr. CDonzetta Mattersfor critical left lower extremity ischemia.  He is also s/p atherectomy of right SFA and drug-coated balloon angioplasty of right SFA on 07-20-18 by Dr. CDonzetta Mattersfor critical right lower extremity ischemia.  Pt had undergone a drug-coated balloon angioplasty of his left SFA on 06-15-18 by Dr. CDonzetta Mattersfor a wound on his foot.  The ulcer at his left medal malleolus has healed. He walks any distance with no claudication.   ABI's today remain unchanged compared to March 2020: no disease in the right LE with triphasic waveforms, mild disease in the left with triphasic and monophasic waveforms.  However, the duplex of the left E arterial system shows a significant improvement to no stenosis from critical stenosis, biphasic  waveforms throughout.    I advised him to see his PCP ASAP to help him manage his uncontrolled DM. He resumed smoking then quit about 3 weeks ago.  He is not taking the atorvastatin on his medication list, he may be taking the Plavix and ASA.   His atherosclerotic risk factors include uncontrolled DM and resumption of smoking at times.   Over 3 minutes was spent counseling patient re smoking cessation, and patient was given free resources re smoking cessation, in [place].   DATA  Left LE Arterial Duplex (02-24-19): LEFT      PSV cm/sRatioStenosisWaveformComments +----------+--------+-----+--------+--------+--------+ CFA Mid   122                  biphasic         +----------+--------+-----+--------+--------+--------+ CFA Distal100                  biphasic         +----------+--------+-----+--------+--------+--------+ DFA       71                   biphasic         +----------+--------+-----+--------+--------+--------+ SFA Prox  171                  biphasic         +----------+--------+-----+--------+--------+--------+ SFA Mid   117                  biphasic         +----------+--------+-----+--------+--------+--------+ SFA Distal70                                    +----------+--------+-----+--------+--------+--------+ POP Prox  53                                    +----------+--------+-----+--------+--------+--------+  POP Distal52                   biphasic         +----------+--------+-----+--------+--------+--------+ ATA Distal33                   biphasic         +----------+--------+-----+--------+--------+--------+ PTA Distal47                   biphasic         +----------+--------+-----+--------+--------+--------+   Left Stent(s): +-----------------+--------+--------+--------+--------+ Mid to distal SFAPSV  cm/sStenosisWaveformComments +-----------------+--------+--------+--------+--------+ Prox to Stent    124             biphasic         +-----------------+--------+--------+--------+--------+ Proximal Stent   87              biphasic         +-----------------+--------+--------+--------+--------+ Mid Stent        74              biphasic         +-----------------+--------+--------+--------+--------+ Distal Stent     54              biphasic         +-----------------+--------+--------+--------+--------+ Distal to Stent  82              biphasic         +-----------------+--------+--------+--------+--------+ Summary: Left: Near normal examination. Patent stent with no evidence of stenosis in the Mid to distal SFA artery. Significant improvement compared to the exam on 12-04-18 at which time there was a PSV at the distal SFA of 623 PSV with monophasic waveforms distal to this.     ABI (Date: 02/24/2019): ABI Findings: +---------+------------------+-----+---------+--------+ Right    Rt Pressure (mmHg)IndexWaveform Comment  +---------+------------------+-----+---------+--------+ Brachial 148                                      +---------+------------------+-----+---------+--------+ ATA      151               1.01 triphasic         +---------+------------------+-----+---------+--------+ PTA      123               0.82 triphasic         +---------+------------------+-----+---------+--------+ Great Toe76                0.51                   +---------+------------------+-----+---------+--------+  +---------+------------------+-----+----------+-------+ Left     Lt Pressure (mmHg)IndexWaveform  Comment +---------+------------------+-----+----------+-------+ Brachial 150                                      +---------+------------------+-----+----------+-------+ ATA      104               0.69 monophasic         +---------+------------------+-----+----------+-------+ PTA      136               0.91 triphasic         +---------+------------------+-----+----------+-------+ Great Toe63                0.42                   +---------+------------------+-----+----------+-------+  +-------+-----------+-----------+------------+------------+  ABI/TBIToday's ABIToday's TBIPrevious ABIPrevious TBI +-------+-----------+-----------+------------+------------+ Right  1.01       0.82       1.12        0.58         +-------+-----------+-----------+------------+------------+ Left   0.91       0.42       0.88        0.50         +-------+-----------+-----------+------------+------------+  Bilateral ABIs appear essentially unchanged compared to prior study on 12/04/2018.   Summary: Right: Resting right ankle-brachial index is within normal range. No evidence of significant right lower extremity arterial disease. The right toe-brachial index is abnormal. RT great toe pressure = 76 mmHg.  Left: Resting left ankle-brachial index indicates mild left lower extremity arterial disease. The left toe-brachial index is abnormal. LT Great toe pressure = 63 mmHg.    PLAN:  Based on the patient's vascular studies and examination, pt will return to clinic in 3 months with left LE Arterial duplex and ABI's. Walk at least 30 minutes total daily in a safe environment.   I discussed in depth with the patient the nature of atherosclerosis, and emphasized the importance of maximal medical management including strict control of blood pressure, blood glucose, and lipid levels, obtaining regular exercise, and cessation of smoking.  The patient is aware that without maximal medical management the underlying atherosclerotic disease process will progress, limiting the benefit of any interventions.  The patient was given information about PAD including signs, symptoms, treatment, what symptoms should  prompt the patient to seek immediate medical care, and risk reduction measures to take.  Clemon Chambers, RN, MSN, FNP-C [facility] Phone: [phone]  Clinic MD: Laqueta Due  02/24/19 3:15 PM

## 2019-03-03 ENCOUNTER — Ambulatory Visit (INDEPENDENT_AMBULATORY_CARE_PROVIDER_SITE_OTHER): Payer: BC Managed Care – PPO | Admitting: Family Medicine

## 2019-03-03 ENCOUNTER — Encounter: Payer: Self-pay | Admitting: Family Medicine

## 2019-03-03 ENCOUNTER — Other Ambulatory Visit: Payer: Self-pay

## 2019-03-03 VITALS — BP 112/70 | HR 87 | Temp 98.2°F | Resp 12 | Wt 152.6 lb

## 2019-03-03 DIAGNOSIS — E1159 Type 2 diabetes mellitus with other circulatory complications: Secondary | ICD-10-CM | POA: Diagnosis not present

## 2019-03-03 DIAGNOSIS — I739 Peripheral vascular disease, unspecified: Secondary | ICD-10-CM

## 2019-03-03 DIAGNOSIS — R911 Solitary pulmonary nodule: Secondary | ICD-10-CM | POA: Diagnosis not present

## 2019-03-03 LAB — POCT GLYCOSYLATED HEMOGLOBIN (HGB A1C): Hemoglobin A1C: 10.8 % — AB (ref 4.0–5.6)

## 2019-03-03 LAB — GLUCOSE, POCT (MANUAL RESULT ENTRY): POC Glucose: 187 mg/dl — AB (ref 70–99)

## 2019-03-03 NOTE — Progress Notes (Signed)
Subjective:    Patient ID: Stanley Chaney, male    DOB: 1961-06-11, 58 y.o.   MRN: [id number]  HPI Stanley Chaney is a 57 y.o. male Presents today for: Chief Complaint  Patient presents with   Diabetes    Patient is here today to f/u on diabetes. He was given a provider from when he was in the hospital. He seen once and he was sent here. Patient was given or office info to come to be f/u on his diabetes   Speaks english with understanding expressed.   I last saw patient in October 2019. Sent to emergency room at that time for evaluation of left open foot wound.  Since that time he has had multiple hospitalizations and emergency department visits.  It appears he was last evaluated by Clent Demark at [facility] in January, with history of type 2 diabetes, peripheral artery disease, tobacco use, chronic left shoulder pain.  Diabetes: Diagnosed after hospitalization October 2019.  Last visit with [facility] in January.  Noted questionable compliance with glipizide at that time but was taking metformin.   Had tried to reduce food portions and carbohydrate intake.  There was some concern about cost of medications at that time due to his previous hospital bills.  Microalbumin: No recent testing Not currently on statin or his ACE inhibitor(previously prescribed lisinopril '10mg'$  and lipitor '10mg'$ , but not taking.   Is on aspirin 81 mg daily. Currently taking metformin 500 mg twice daily. No new side effects or diarrhea. Just picked up refill.  Prior on glipizide '5mg'$  qd - not taking, no side effects.  Not checking home blood sugars - has equipment at home, but scared of infection with testing.  No smoking,.   No recent lipid panel. Due for ophthalmology exam.  Up-to-date on foot exam and Pneumovax. Results for orders placed or performed in visit on 03/03/19  POCT glycosylated hemoglobin (Hb A1C)  Result Value Ref Range   Hemoglobin A1C 10.8  (A) 4.0 - 5.6 %   HbA1c POC (<> result, manual entry)     HbA1c, POC (prediabetic range)     HbA1c, POC (controlled diabetic range)    POCT glucose (manual entry)  Result Value Ref Range   POC Glucose 187 (A) 70 - 99 mg/dl    Lab Results  Component Value Date   HGBA1C 10.8 (A) 03/03/2019   HGBA1C 9.8 (A) 09/14/2018   HGBA1C 10.4 (H) 06/11/2018   Lab Results  Component Value Date   LDLCALC 100 (H) 06/11/2018   CREATININE 0.70 12/30/2018    Peripheral artery disease: Last visit with vascular and vein specialist on June 17. History of stent placement of the left SFA in April by Dr. Donzetta Matters for critical lower left extremity ischemia, atherectomy of right SFA with drug-coated balloon angioplasty of right SFA in November 2019 for critical right lower extremity ischemia.  Prior left SFA in October 2019 for the wound on his foot.  Reported improvement of foot ulcers since above procedures when seen at June 17 vascular visit. Quit smoking in October, then resumed and quit against 3 weeks prior to June 17 vascular appt.  3 month follow-up appointment planned.  Some DOE since October, no recent changes, no chest pains.    Lung nodule: CT angio of  chest on March 5, small ill-defined density within the inferior portion of the right upper lobe measuring 1.5 x 0.6 cm.  Plan for repeat CT at 3 months. No hemoptysis,  new cough, or dyspnea. No new weight loss.  Wt Readings from Last 3 Encounters:  03/03/19 152 lb 9.6 oz (69.2 kg)  02/24/19 150 lb (68 kg)  12/30/18 150 lb (68 kg)     Patient Active Problem List   Diagnosis Date Noted   PAD (peripheral artery disease) ([place]) 07/20/2018   Malnutrition of moderate degree 06/11/2018   Cellulitis of left foot 06/10/2018   Diabetes mellitus ([place]) 06/10/2018   Hyperglycemia 06/10/2018   Cellulitis 06/10/2018   Past Medical History:  Diagnosis Date   Diabetes mellitus without complication ([place])    PAD (peripheral artery disease) ([place])      Past Surgical History:  Procedure Laterality Date   ABDOMINAL AORTOGRAM W/LOWER EXTREMITY Bilateral 06/15/2018   Procedure: ABDOMINAL AORTOGRAM W/LOWER EXTREMITY;  Surgeon: [name], MD;  Location: [facility];  Service: Cardiovascular;  Laterality: Bilateral;   ABDOMINAL AORTOGRAM W/LOWER EXTREMITY Left 12/30/2018   Procedure: ABDOMINAL AORTOGRAM W/LOWER EXTREMITY;  Surgeon: [name], MD;  Location: [facility];  Service: Cardiovascular;  Laterality: Left;   I&D EXTREMITY Left 06/12/2018   Procedure: Excisional Debridement left foot;  Surgeon: [name], MD;  Location: [facility];  Service: Orthopedics;  Laterality: Left;   LOWER EXTREMITY ANGIOGRAM Right 07/20/2018     Right lower extremity angiogram   LOWER EXTREMITY ANGIOGRAPHY Right 07/20/2018   Procedure: Lower Extremity Angiography;  Surgeon: [name], MD;  Location: [facility];  Service: Cardiovascular;  Laterality: Right;   PERIPHERAL VASCULAR ATHERECTOMY Left 06/15/2018   Procedure: PERIPHERAL VASCULAR ATHERECTOMY;  Surgeon: [name], MD;  Location: [facility];  Service: Cardiovascular;  Laterality: Left;  SFA   PERIPHERAL VASCULAR BALLOON ANGIOPLASTY Left 06/15/2018   Procedure: PERIPHERAL VASCULAR BALLOON ANGIOPLASTY;  Surgeon: [name], MD;  Location: [facility];  Service: Cardiovascular;  Laterality: Left;  SFA   PERIPHERAL VASCULAR BALLOON ANGIOPLASTY Right 07/20/2018   Procedure: PERIPHERAL VASCULAR BALLOON ANGIOPLASTY;  Surgeon: [name], MD;  Location: [facility];  Service: Cardiovascular;  Laterality: Right;  SFA WITH DRUG COATED    PERIPHERAL VASCULAR INTERVENTION Left 12/30/2018   Procedure: PERIPHERAL VASCULAR INTERVENTION;  Surgeon: [name], MD;  Location: [facility];  Service: Cardiovascular;  Laterality: Left;  SFA   No Known Allergies Prior  to Admission medications   Medication Sig Start Date End Date Taking? Authorizing Provider  aspirin 81 MG EC tablet Take 1 tablet (81 mg total) by mouth daily. 07/15/18   Clent Demark, PA-C  blood glucose meter kit and supplies Dispense based on patient and insurance preference. Use up to four times daily as directed. (FOR ICD-10 E10.9, E11.9). 06/16/18   Domenic Polite, MD  Lancets MISC Inject 1 each into the skin 3 (three) times daily as needed. 09/14/18   Clent Demark, PA-C  metFORMIN (GLUCOPHAGE) 500 MG tablet Take 1 tablet (500 mg total) by mouth 2 (two) times daily with a meal. Patient taking differently: Take 500 mg by mouth 2 (two) times daily.  07/15/18   Clent Demark, PA-C   Social History   Socioeconomic History   Marital status: Married    Spouse name: Not on file   Number of children: Not on file   Years of education: Not on file   Highest education level: Not on file  Occupational History   Not on file  Social Needs   Financial resource strain: Not on  file   Food insecurity    Worry: Not on file    Inability: Not on file   Transportation needs    Medical: Not on file    Non-medical: Not on file  Tobacco Use   Smoking status: Former Smoker    Quit date: 06/14/2018    Years since quitting: 0.7   Smokeless tobacco: Never Used   Tobacco comment: pt reports quitting 6 months ago  Substance and Sexual Activity   Alcohol use: Yes    Alcohol/week: 18.0 standard drinks    Types: 18 Cans of beer per week    Comment: pt reports not drinking for past 6 months   Drug use: Never   Sexual activity: Not on file  Lifestyle   Physical activity    Days per week: Not on file    Minutes per session: Not on file   Stress: Not on file  Relationships   Social connections    Talks on phone: Not on file    Gets together: Not on file    Attends religious service: Not on file    Active member of club or organization: Not on file    Attends meetings of  clubs or organizations: Not on file    Relationship status: Not on file   Intimate partner violence    Fear of current or ex partner: Not on file    Emotionally abused: Not on file    Physically abused: Not on file    Forced sexual activity: Not on file  Other Topics Concern   Not on file  Social History Narrative   Not on file    Review of Systems  Constitutional: Positive for fatigue. Negative for unexpected weight change.  Eyes: Negative for visual disturbance.  Respiratory: Negative for cough, chest tightness and shortness of breath.   Cardiovascular: Negative for chest pain, palpitations and leg swelling.  Gastrointestinal: Negative for abdominal pain and blood in stool.  Neurological: Negative for dizziness, light-headedness and headaches.       Objective:   Physical Exam Vitals signs reviewed.  Constitutional:      Appearance: He is well-developed.  HENT:     Head: Normocephalic and atraumatic.     Mouth/Throat:     Pharynx: No posterior oropharyngeal erythema.  Eyes:     Pupils: Pupils are equal, round, and reactive to light.  Neck:     Vascular: No carotid bruit or JVD.  Cardiovascular:     Rate and Rhythm: Normal rate and regular rhythm.     Heart sounds: Normal heart sounds. No murmur.  Pulmonary:     Effort: Pulmonary effort is normal. No respiratory distress.     Breath sounds: Normal breath sounds. No wheezing or rales.  Skin:    General: Skin is warm and dry.  Neurological:     Mental Status: He is alert and oriented to person, place, and time.    Vitals:   03/03/19 1548  BP: 112/70  Pulse: 87  Resp: 12  Temp: 98.2 F (36.8 C)  TempSrc: Oral  SpO2: 98%  Weight: 152 lb 9.6 oz (69.2 kg)          Assessment & Plan:   Stanley Chaney is a 58 y.o. male Type 2 diabetes mellitus with other circulatory complication, without long-term current use of insulin ([place]) - Plan: POCT glycosylated hemoglobin (Hb A1C), POCT glucose (manual entry),  Comprehensive metabolic panel, CANCELED: Hemoglobin A1c  -Uncontrolled.  Will need additional medication, but he  is hesitant to take other medications, as well as checking home blood sugar.  Recommended he at least initially check on blood sugars, bring record of those in 2 weeks.  -We will be checking renal function/electrolytes as above to determine other med changes in the meantime, specifically higher dose of metformin, and would consider SGLT2 or possibly insulin for improved control  Solitary pulmonary nodule - Plan: CT CHEST NODULE FOLLOW UP LOW DOSE W/O  -Noted on CT 3 months ago, asymptomatic, repeat CT as recommended was ordered  PAD (peripheral artery disease) ([place]) - Plan: Lipid Panel, Comprehensive metabolic panel  -Continue follow-up with vascular specialist.  Will strive for improved diabetes control  No orders of the defined types were placed in this encounter.  Patient Instructions   No change in meds for now, will discuss meds and xray results at follow up in 2 weeks. 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  Xray ordered at [facility].  Keep a record of your blood sugars outside of the office and bring them to the next office visit.   ???????? ?????? 2 ???? ? ????????, ???? ?? ????? Type 2 Diabetes Mellitus, Self Care, Adult ????? ?????????? ???????? ?????? 2 ???? (???????? ?????? 2 ????) ????? ?????????????? ? ???? ??????? ?????? (???????) ? ????? ????? ??????????? ??????? ?????:  ????????.  ??????????? ????????????.  ??????????? ? ?????? ?????.  ??????? ???????? ??? ????????, ??? ?????????????.  ?????????? ?? ??????? ?????????? ?????? ? ?????? ???. ????????? ?????????? ?????????, ??? ????? ?????, ????? ??????????? ? ???????? ? ???? ????. ?????? ?????? ??????? ??????? ????? ??????????? ??? ????? ???????? ?????? ???????????? (???????????) ???????????, ????? ??? ??????????? ?????? ? ??????????? ?????. ??? ??????? ???? ????? ????????? ????????? ??? ???????????? ??????????  ???????. ?????? ??????????? ????? ????:  ???????.  ???????? ??? ???????? ?????? ???????????.  ???????? ??? ???????? ????????????? ????????. ??? ?????????????? ??????? ??????? ? ?????   ?????????? ??????? ??????? ? ????? ?????? ???? ??? ?????, ??? ??????? ????? ??????? ??????.  ?????????? ? ???? ??????? A1c (?????????? A1c) ??? ??? ????? ??? ? ??? ??? ??? ?????, ??? ??????? ????? ??????? ??????. ??? ??????? ???? ????????? ?????????????? ???? ??????? ??? ???. ?????? ???? ??????? ??????? ? ???, ????? ???????????? ????????? ?????? ??????? ? ?????:  ????? ????????? ???????? ???? (??????????????? ???????): 80-130 ??/?? (4,4-7,2 ?????/?).  ????? ???????? ??????? ???? (???????????????? ???????): ???? 180 ??/?? (10 ?????/?).  ??????? A1c: ?????? 7%. ??? ????????? ??????????? ????????????? ? ???????????? ???????? ????????????? ?????????????, ????? ????????? ??????? ??????? ??????? ?????, ?????????, ????? ??????? ??????? ? ????? ??????? ?????. ?????????, ??? ??? ???????? ?????? ???????? ?????????????, ????? ???:  ?????????? ?????.  ?????.  ?????????? ????????.  ??????????? ? ?????????????? ????, ??? ??????.  ?????????? ??????. ???????? ???????????? ????????????, ????? ?????????? ?????? ??????? ??????? ?????, ?????????, ????? ??????? ??????? ? ????? ?????? ?? 70 ??/?? (3,9 ?????/?) ??? ????. ???? ???????????? ?????????? ?? ????? ??? ????? ??????????? ?????????? ????????, ?? ????? ???, ??????? ? ????? ?? ??????????? ???????? ?????? ???? ??? ????? ?? ?????????? ???? (?????????). ????? ????? ???????? ???????????? ? ????? ?? ????????? ??. ?????? ?????? ??? ???? ??????? ? 15 ???????? ?????? ???????????? ???????? ??? ??????????? ??????? ?????? ??????? ? ?????. ????????????? ? ??????? ??????? ????? ??????? ????? ???????? ? ????????, ??? ????????? ???????????? ? ??????, ???? ?? ?????? ?? ? ????????? ?????????? ???????? ??????????????. ?????????? ????? ????:  ?????.  ???????.  ?????????? ?  ???????? ????????.  ??????????? ????????.  ?????????????? ??? ???????? ??????????????? ?????????.  ??????????.  ???????.  ????????? ??????? ????????? ??????????.  ???????? ????.  ?????????? ??????.  ?????????????????.  ????????? ???????????.  ???????? ??????????? ??? ???????? ??????? ???, ??? ??? ?????.  ??????????? ???.  ???????.  ?????????? ???????. ??????? ???????????? ???? ?? ? ???????? ? ?????? ????????? ???????, ?????????? ???????  15:15: °· ??????? ?????? 15 ??????? ?????? ???????????? ????????. ?????????? ? ????? ??????? ?????? ? ???, ??????? ??? ??????? ?????????. °· ????????? ?????? ??????????? ????????? ????????: °? ???????? ??????? (??????? 15 ?????). °? 6-8 ???? ?????????? ????????. °? 4-6 ????? (120-150 ??) ?????????? ????. °? 4-6 ????? (120-150 ??) ??????? (?? ???????????) ???????????? ????. °? 1 ???????? ????? (15 ??) ???? ??? ??????. °· ????????? ??????? ??????? ? ????? ????? 15 ????? ????? ?????? ????????. °· ???? ??????? ??????? ? ????? ??? ??? ????????? ?? ?????? 70 ??/?? (3,9 ?????/?) ??? ????, ??????? ??? 15 ??????? ????????. °· ???? ????? 3 ??????? ??? ??????? ??????? ? ????? ?? ??????????? ???? 70 ??/?? (3,9 ?????/?), ?????????? ?? ?????????? ??????????? ???????. °· ????? ????, ??? ??? ??????? ??????? ? ????? ?????????????, ? ???????? 1 ???? ??????? ???????????? ?????? ??? ??????????. °??????? ??????? ???????????? °??????? ???????????? -- ??? ????? ??? ??????? ??????? ? ????? ?????????? 54 ??/?? (3 ?????/?) ??? ????. ??????? ???????????? -- ??? ????????? ? ???????????? ? ?????????? ??????????? ??????. ?? ?????, ???? ???????? ??????? ????. ?????????? ?????????? ?? ??????????? ???????. ????????? ? ??????? ?????? ?????????? ?????? (? ??? ?? ?????? 911). °???? ? ??? ???????? ??????? ????????????, ? ?? ?? ?????? ?? ????, ?? ????, ????????, ??? ??????????? ???????? ?????????. ???? ????? ??? ??????? ???? ?????? ?????, ??? ????????? ? ??? ??????? ??????? ? ????? ? ???  ??????? ??? ???????? ?????????. ???????? ? ?????? ???????? ?????, ??????? ?? ??? ?? ?????? ???????????? ???????? ????? ??? ????? ???????? ??? ?????????? ???????? ?????????. °??? ??????? ???????????? ????? ????????????? ??????? ? ????????. ????? ??????? ????? ???????? ???????????? ???????? ??????? ????? ??????????. ????? ????? ???????????? ??????? ??????? ????????????? ????????????. °? ???????? ???????? ???????? ????? ????????????: °?????????? ???????????????????? ????????? ? ???????????? ? ??????? ??? ?????????? °· ???? ??? ??????? ???? ???????? ??????? ??? ???????????????????? ?????????, ?????????? ?? ?????????. °· ?? ??????? ????????? ?????????????? ?????? ???????? ??? ?????? ???????????????????? ????????, ??????? ?? ??????????. ?????????? ???????, ????? ? ??? ?????? ??? ????? ????????. °· ???? ?? ?????????? ???????, ?????????? ??? ????????? ? ??????????? ?? ????? ?????????? ?????????? ? ?? ????, ????? ???????? ?? ?????. ??? ??????? ???? ?????????, ??? ??? ?????????????? ?????????. °??????? ????? ? ?????? ???????? ???? ° °???, ??? ?? ????? ? ?????, ?????? ?? ??? ??????? ??????? ? ????? ? ????????? ????????. ?????????? ????? ??????? ???????? ?????????????? ??????? ??????? ? ????????????? ?????? ????????? ????????. ???? ????????? ??????? ???????? ???????????? «???????» ??????, ??????? ?????????, ?????? ??????? ? ??????, ???????? ???????? ????????? ? «????????» ?????. °??????? ?????????? ?? ????? ? ??????????? ?? ??????? ? ?????? (???????????????? ?????-?????????), ??????? ??????? ??? ??????? ???? ???????, ?????????? ??? ???. ??????? ?? ???, ????? ??: °· ????????? ??????????? ?????? ???????? ????? ? ????????? ??????????? ??? ??? ?????. °· ???? ?????????? ????????, ????? ???? ???? ?????????? ??????-??????? ?????. °· ?????????? ????????????? ? ???? ????????. ?????????? ???, ????? ???????? ????????? ??????? ? ???? ??????? ??????????? ?????? ?????????. °· ?????????? ?? ???? ??? ??????? ???????????? ?????? ???, ????? ??  ?? ?????? ???? ??? ???? ???, ??? ??????. ??????? ???????????? ???? ???? ? ??????? ??????. ° °?????? ???????? ????? ????? °????????? ??????? ?????????? ??????????, ??? ??????? ????? ??????? ??????. ??? ????? ????????: °· ?????????? ?? ???????? ? ??????? ??????????, ????? ??? ???? ??? ?????? ????????, ?? ??????? ???? 2 ???? ? ??????. °· ?????? ?????? ???????? ?? ??????? ???? 150 ????? ??????????? ????????? ??? ??????? ?????????????. ??? ????? ???? ??????? ??????, ???? ?? ?????????? ??? ?????? ????????. °? ???????????? ?????????? ???????? ?? 3 ??? ?????? ???? ? ??????. °? ?? ??????? ?????? 2 ???? ?????? ?????????? ?????????? ????????????. °????? ? ??? ?????????? ????? ?????????? ???????? ??? ????????????, ?????????? ? ?????? ???????? ?????, ????? ?? ???? ????????????? ??????????????? ????? ????????, ????????????? ?????????? ??? ????. °???????? ????? ? ?????? ????????? ?????? ????? °· ?? ??????? ??????????? ????? ???????? ???????, ????????, ????????, ??????????? ????? ? ??????????? ????????. ???? ??? ?????? ?? ??????? ??? ????? ??????, ?????????? ? ?????? ???????? ?????. °· ???? ??? ??????? ???? ???????, ??? ???????? ????????? ??? ???, ?????????? ???????????? ???????? ??   1 ?????? ? ???? ??? ??????, ??????? ?? ?????????, ? ?? 2 ?????? ? ???? -- ??? ??????. ???? ?????? -- ??? 12 ????? (355??) ????, 5 ????? (148??) ???? ??? 1 ????? (44??) ???????? ????????? ???????.  ????????? ????????? ????????. ???? ??? ????? ??????, ?????????? ? ?????? ???????? ?????. ?????????? ? ????? ?????????   ??????? ??????? ????????. ? ?????????? ? ???????????, ???????????? ????? ??????? ??????, ?????????????, ????? ?? ???? ??????? ?????? ????????? ????????: ? ????? (?????????). ??????? ????????? ???????? ?????? ??????. ? ?????????. ? ??????? B.  ?????????? ?? ???????????? ???? ?????? ????? ?????????? ?????? ????????, ? ????? ???????????? ? ???????????? ???? ??? ? ???.  ????????? ???????????? ???? ???? ? ?????? ?? ???????  ???????, ???????, ???????????, ??????? ??? ???. ?????????? ?? ???????????? ???? ????? ??????? ?????? ???? ??? ? ???.  ??????? ???? ? ????? ??? ???? ? ???? ? ??????????? ?????? ????? ???? ??? ????????? ??? ? ????. ????????? ?? ????? ? ??????????? ???? ??? ????????? ??? ? 6 ???????.  ????????????? ?????????? ???. ????? ????????  ?????????? ?????????????? ? ??????????? ????????????? ????????? ?????? ??? ??????? ????? ??????? ??????.  ???????? ? ????? ????? ??????? ??????? ?? ??????, ? ????? ? ?????? ????? ?????.  ?????? ??????????? ????????? ???????? ??? ??????????? ????????? ???????.  ????????? ?? ??? ??????????? ??????, ??? ??????? ????? ??????? ??????. ??? ?????. ???????, ??????? ??????? ?????? ?????? ???????? ?????  ????? ?? ??? ??????????? ?? ???????????? ?? ???????? ??????? ???????? ?????????  ??? ??? ????? ?????? ????????? ??? ????? ? ????????? ??? ????? ????? ?????????????? ?????????? ??? ????????? ?????????????? ?????????? ? ???????, ???????? ????????? ???-?????:  American Diabetes Association (ADA) (???????????? ????????????? ??????????): [email or web address]  American Association of Diabetes Educators (AADE) (???????????? ?????????? ?? ???????????????? ???????? ????????? ? ???????? ????????): [email or web address] ??????  ????? ?????????? ???????? (???????? ?????? 2 ????) ????? ?????????????? ? ???? ??????? ?????? (???????) ? ????? ????? ??????????? ??????? ????? ????????, ??????????? ??????????, ?????????? ?????? ????? ? ??????? ????????.  ?????????? ??????? ??????? ? ????? ?????? ???? ??? ?????, ??? ??????? ????? ??????? ??????.  ??????? ??????? ????? ??????????? ??? ????? ???????? ?????? ???????????? (???????????) ???????????, ????? ??? ??????????? ?????? ? ??????????? ?????. ??? ??????? ???? ????? ????????? ????????? ??? ???????????? ?????????? ???????.  ????????? ?? ??? ??????????? ??????, ??? ??????? ????? ??????? ??????. ??? ?????. ??? ?????????? ?? ????? ????????  ??????, ??????????????? ????? ??????. ??????????? ???????? ????? ???????????? ??? ??????? ? ????? ??????? ??????. Document Released: 01/07/2017 Document Revised: 04/14/2018 Elsevier Interactive Patient Education  [employer].    If you have lab work done today you will be contacted with your lab results within the next 2 weeks.  If you have not heard from [place] then please contact us. The fastest way to get your results is to register for My Chart.   IF you received an x-ray today, you will receive an invoice from [facility]. Please contact [facility] at [phone] with questions or concerns regarding your invoice.   IF you received labwork today, you will receive an invoice from [facility]. Please contact [facility] at [phone] with questions or concerns regarding your invoice.   Our billing staff will not be able to assist you with questions regarding bills from these companies.  You will be contacted with the lab results as soon as they are available. The fastest way to get your results is to activate your My Chart account. Instructions are located on the last page of this paperwork. If you have not heard from [place] regarding the results in 2 weeks, please contact this office.       Signed,   Dellis Filbert  Carlota Raspberry, MD [facility] at [facility].  03/04/19 10:14 PM

## 2019-03-03 NOTE — Patient Instructions (Addendum)
No change in meds for now, will discuss meds and xray results at follow up in 2 weeks. 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  Xray ordered at [facility].  Keep a record of your blood sugars outside of the office and bring them to the next office visit.   ???????? ?????? 2 ???? ? ????????, ???? ?? ????? Type 2 Diabetes Mellitus, Self Care, Adult ????? ?????????? ???????? ?????? 2 ???? (???????? ?????? 2 ????) ????? ?????????????? ? ???? ??????? ?????? (???????) ? ????? ????? ??????????? ??????? ?????:  ????????.  ??????????? ????????????.  ??????????? ? ?????? ?????.  ??????? ???????? ??? ????????, ??? ?????????????.  ?????????? ?? ??????? ?????????? ?????? ? ?????? ???. ????????? ?????????? ?????????, ??? ????? ?????, ????? ??????????? ? ???????? ? ???? ????. ?????? ?????? ??????? ??????? ????? ??????????? ??? ????? ???????? ?????? ???????????? (???????????) ???????????, ????? ??? ??????????? ?????? ? ??????????? ?????. ??? ??????? ???? ????? ????????? ????????? ??? ???????????? ?????????? ???????. ?????? ??????????? ????? ????:  ???????.  ???????? ??? ???????? ?????? ???????????.  ???????? ??? ???????? ????????????? ????????. ??? ?????????????? ??????? ??????? ? ?????   ?????????? ??????? ??????? ? ????? ?????? ???? ??? ?????, ??? ??????? ????? ??????? ??????.  ?????????? ? ???? ??????? A1c (?????????? A1c) ??? ??? ????? ??? ? ??? ??? ??? ?????, ??? ??????? ????? ??????? ??????. ??? ??????? ???? ????????? ?????????????? ???? ??????? ??? ???. ?????? ???? ??????? ??????? ? ???, ????? ???????????? ????????? ?????? ??????? ? ?????:  ????? ????????? ???????? ???? (??????????????? ???????): 80-130 ??/?? (4,4-7,2 ?????/?).  ????? ???????? ??????? ???? (???????????????? ???????): ???? 180 ??/?? (10 ?????/?).  ??????? A1c: ?????? 7%. ??? ????????? ??????????? ????????????? ? ???????????? ???????? ????????????? ?????????????, ????? ????????? ??????? ??????? ??????? ?????, ?????????, ?????  ??????? ??????? ? ????? ??????? ?????. ?????????, ??? ??? ???????? ?????? ???????? ?????????????, ????? ???:  ?????????? ?????.  ?????.  ?????????? ????????.  ??????????? ? ?????????????? ????, ??? ??????.  ?????????? ??????. ???????? ???????????? ????????????, ????? ?????????? ?????? ??????? ??????? ?????, ?????????, ????? ??????? ??????? ? ????? ?????? ?? 70 ??/?? (3,9 ?????/?) ??? ????. ???? ???????????? ?????????? ?? ????? ??? ????? ??????????? ?????????? ????????, ?? ????? ???, ??????? ? ????? ?? ??????????? ???????? ?????? ???? ??? ????? ?? ?????????? ???? (?????????). ????? ????? ???????? ???????????? ? ????? ?? ????????? ??. ?????? ?????? ??? ???? ??????? ? 15 ???????? ?????? ???????????? ???????? ??? ??????????? ??????? ?????? ??????? ? ?????. ????????????? ? ??????? ??????? ????? ??????? ????? ???????? ? ????????, ??? ????????? ???????????? ? ??????, ???? ?? ?????? ?? ? ????????? ?????????? ???????? ??????????????. ?????????? ????? ????:  ?????.  ???????.  ?????????? ? ???????? ????????.  ??????????? ????????.  ?????????????? ??? ???????? ??????????????? ?????????.  ??????????.  ???????.  ????????? ??????? ????????? ??????????.  ???????? ????.  ?????????? ??????.  ?????????????????.  ????????? ???????????.  ???????? ??????????? ??? ???????? ??????? ???, ??? ??? ?????.  ??????????? ???.  ???????.  ?????????? ???????. ??????? ???????????? ???? ?? ? ???????? ? ?????? ????????? ???????, ?????????? ??????? 15:15:  ??????? ?????? 15 ??????? ?????? ???????????? ????????. ?????????? ? ????? ??????? ?????? ? ???, ??????? ??? ??????? ?????????.  ????????? ?????? ??????????? ????????? ????????: ? ???????? ??????? (??????? 15 ?????). ? 6-8 ???? ?????????? ????????. ? 4-6 ????? (120-150 ??) ?????????? ????. ? 4-6 ????? (120-150 ??) ??????? (?? ???????????) ???????????? ????. ? 1 ???????? ????? (15 ??) ???? ??? ??????.  ????????? ??????? ??????? ? ????? ????? 15  ????? ????? ?????? ????????.  ???? ??????? ??????? ? ????? ??? ??? ????????? ?? ?????? 70 ??/?? (3,9 ?????/?) ??? ????, ??????? ??? 15 ??????? ????????.  ???? ????? 3 ??????? ??? ??????? ??????? ? ????? ?? ??????????? ????  70 ??/?? (3,9 ?????/?), ?????????? ?? ?????????? ??????????? ???????.  ????? ????, ??? ??? ??????? ??????? ? ????? ?????????????, ? ???????? 1 ???? ??????? ???????????? ?????? ??? ??????????. ??????? ??????? ???????????? ??????? ???????????? - ??? ????? ??? ??????? ??????? ? ????? ?????????? 54 ??/?? (3 ?????/?) ??? ????. ??????? ???????????? - ??? ????????? ? ???????????? ? ?????????? ??????????? ??????. ?? ?????, ???? ???????? ??????? ????. ?????????? ?????????? ?? ??????????? ???????. ????????? ? ??????? ?????? ?????????? ?????? (? ??? ?? ?????? 911). ???? ? ??? ???????? ??????? ????????????, ? ?? ?? ?????? ?? ????, ?? ????, ????????, ??? ??????????? ???????? ?????????. ???? ????? ??? ??????? ???? ?????? ?????, ??? ????????? ? ??? ??????? ??????? ? ????? ? ??? ??????? ??? ???????? ?????????. ???????? ? ?????? ???????? ?????, ??????? ?? ??? ?? ?????? ???????????? ???????? ????? ??? ????? ???????? ??? ?????????? ???????? ?????????. ??? ??????? ???????????? ????? ????????????? ??????? ? ????????. ????? ??????? ????? ???????? ???????????? ???????? ??????? ????? ??????????. ????? ????? ???????????? ??????? ??????? ????????????? ????????????. ? ???????? ???????? ???????? ????? ????????????: ?????????? ???????????????????? ????????? ? ???????????? ? ??????? ??? ??????????  ???? ??? ??????? ???? ???????? ??????? ??? ???????????????????? ?????????, ?????????? ?? ?????????.  ?? ??????? ????????? ?????????????? ?????? ???????? ??? ?????? ???????????????????? ????????, ??????? ?? ??????????. ?????????? ???????, ????? ? ??? ?????? ??? ????? ????????.  ???? ?? ?????????? ???????, ?????????? ??? ????????? ? ??????????? ?? ????? ?????????? ?????????? ? ?? ????, ????? ???????? ?? ?????. ???  ??????? ???? ?????????, ??? ??? ?????????????? ?????????. ??????? ????? ? ?????? ???????? ????  ???, ??? ?? ????? ? ?????, ?????? ?? ??? ??????? ??????? ? ????? ? ????????? ????????. ?????????? ????? ??????? ???????? ?????????????? ??????? ??????? ? ????????????? ?????? ????????? ????????. ???? ????????? ??????? ???????? ???????????? ??????? ??????, ??????? ?????????, ?????? ??????? ? ??????, ???????? ???????? ????????? ? ???????? ?????. ??????? ?????????? ?? ????? ? ??????????? ?? ??????? ? ?????? (???????????????? ?????-?????????), ??????? ??????? ??? ??????? ???? ???????, ?????????? ??? ???. ??????? ?? ???, ????? ??:  ????????? ??????????? ?????? ???????? ????? ? ????????? ??????????? ??? ??? ?????.  ???? ?????????? ????????, ????? ???? ???? ?????????? ??????-??????? ?????.  ?????????? ????????????? ? ???? ????????. ?????????? ???, ????? ???????? ????????? ??????? ? ???? ??????? ??????????? ?????? ?????????.  ?????????? ?? ???? ??? ??????? ???????????? ?????? ???, ????? ?? ?? ?????? ???? ??? ???? ???, ??? ??????. ??????? ???????????? ???? ???? ? ??????? ??????.  ?????? ???????? ????? ????? ????????? ??????? ?????????? ??????????, ??? ??????? ????? ??????? ??????. ??? ????? ????????:  ?????????? ?? ???????? ? ??????? ??????????, ????? ??? ???? ??? ?????? ????????, ?? ??????? ???? 2 ???? ? ??????.  ?????? ?????? ???????? ?? ??????? ???? 150 ????? ??????????? ????????? ??? ??????? ?????????????. ??? ????? ???? ??????? ??????, ???? ?? ?????????? ??? ?????? ????????. ? ???????????? ?????????? ???????? ?? 3 ??? ?????? ???? ? ??????. ? ?? ??????? ?????? 2 ???? ?????? ?????????? ?????????? ????????????. ????? ? ??? ?????????? ????? ?????????? ???????? ??? ????????????, ?????????? ? ?????? ???????? ?????, ????? ?? ???? ????????????? ??????????????? ????? ????????, ????????????? ?????????? ??? ????. ???????? ????? ? ?????? ????????? ?????? ?????  ?? ??????? ??????????? ????? ???????? ???????,  ????????, ????????, ??????????? ????? ? ??????????? ????????. ???? ??? ?????? ?? ??????? ??? ????? ??????, ?????????? ? ?????? ???????? ?????.  ???? ??? ??????? ???? ???????, ??? ???????? ????????? ??? ???, ?????????? ???????????? ???????? ?? 1 ?????? ? ???? ??? ??????, ??????? ?? ?????????, ? ?? 2 ?????? ? ???? - ??? ??????. ???? ?????? - ??? 12 ????? (355??) ????, 5 ????? (148??) ???? ??? 1 ????? (44??) ???????? ????????? ???????.  ????????? ????????? ????????. ???? ??? ????? ??????, ?????????? ? ?????? ???????? ?????. ?????????? ? ????? ?????????   ??????? ??????? ????????. ? ?????????? ? ???????????, ???????????? ????? ??????? ??????, ?????????????, ????? ?? ???? ??????? ?????? ????????? ????????: ? ????? (?????????). ??????? ????????? ???????? ?????? ??????. ? ?????????. ? ???????   B.  ?????????? ?? ???????????? ???? ?????? ????? ?????????? ?????? ????????, ? ????? ???????????? ? ???????????? ???? ??? ? ???.  ????????? ???????????? ???? ???? ? ?????? ?? ??????? ???????, ???????, ???????????, ??????? ??? ???. ?????????? ?? ???????????? ???? ????? ??????? ?????? ???? ??? ? ???.  ??????? ???? ? ????? ??? ???? ? ???? ? ??????????? ?????? ????? ???? ??? ????????? ??? ? ????. ????????? ?? ????? ? ??????????? ???? ??? ????????? ??? ? 6 ???????.  ????????????? ?????????? ???. ????? ????????  ?????????? ?????????????? ? ??????????? ????????????? ????????? ?????? ??? ??????? ????? ??????? ??????.  ???????? ? ????? ????? ??????? ??????? ?? ??????, ? ????? ? ?????? ????? ?????.  ?????? ??????????? ????????? ???????? ??? ??????????? ????????? ???????.  ????????? ?? ??? ??????????? ??????, ??? ??????? ????? ??????? ??????. ??? ?????. ???????, ??????? ??????? ?????? ?????? ???????? ?????  ????? ?? ??? ??????????? ?? ???????????? ?? ???????? ??????? ???????? ?????????  ??? ??? ????? ?????? ????????? ??? ????? ? ????????? ??? ????? ????? ?????????????? ?????????? ??? ?????????  ?????????????? ?????????? ? ???????, ???????? ????????? ???-?????:  American Diabetes Association (ADA) (???????????? ????????????? ??????????): [email or web address]  American Association of Diabetes Educators (AADE) (???????????? ?????????? ?? ???????????????? ???????? ????????? ? ???????? ????????): [email or web address] ??????  ????? ?????????? ???????? (???????? ?????? 2 ????) ????? ?????????????? ? ???? ??????? ?????? (???????) ? ????? ????? ??????????? ??????? ????? ????????, ??????????? ??????????, ?????????? ?????? ????? ? ??????? ????????.  ?????????? ??????? ??????? ? ????? ?????? ???? ??? ?????, ??? ??????? ????? ??????? ??????.  ??????? ??????? ????? ??????????? ??? ????? ???????? ?????? ???????????? (???????????) ???????????, ????? ??? ??????????? ?????? ? ??????????? ?????. ??? ??????? ???? ????? ????????? ????????? ??? ???????????? ?????????? ???????.  ????????? ?? ??? ??????????? ??????, ??? ??????? ????? ??????? ??????. ??? ?????. ??? ?????????? ?? ????? ???????? ??????, ??????????????? ????? ??????. ??????????? ???????? ????? ???????????? ??? ??????? ? ????? ??????? ??????. Document Released: 01/07/2017 Document Revised: 04/14/2018 Elsevier Interactive Patient Education  [employer].    If you have lab work done today you will be contacted with your lab results within the next 2 weeks.  If you have not heard from [place] then please contact us. The fastest way to get your results is to register for My Chart.   IF you received an x-ray today, you will receive an invoice from [facility]. Please contact [facility] at [phone] with questions or concerns regarding your invoice.   IF you received labwork today, you will receive an invoice from [facility]. Please contact [facility] at [phone] with questions or concerns regarding your invoice.   Our billing staff will not be able to assist you with questions regarding bills from these companies.  You  will be contacted with the lab results as soon as they are available. The fastest way to get your results is to activate your My Chart account. Instructions are located on the last page of this paperwork. If you have not heard from [place] regarding the results in 2 weeks, please contact this office.

## 2019-03-04 ENCOUNTER — Encounter: Payer: Self-pay | Admitting: Family Medicine

## 2019-03-04 LAB — LIPID PANEL
Chol/HDL Ratio: 4.8 ratio (ref 0.0–5.0)
Cholesterol, Total: 196 mg/dL (ref 100–199)
HDL: 41 mg/dL (ref >39)
LDL Calculated: 107 mg/dL — ABNORMAL HIGH (ref 0–99)
Triglycerides: 242 mg/dL — ABNORMAL HIGH (ref 0–149)
VLDL Cholesterol Cal: 48 mg/dL — ABNORMAL HIGH (ref 5–40)

## 2019-03-04 LAB — COMPREHENSIVE METABOLIC PANEL
ALT: 14 IU/L (ref 0–44)
AST: 10 IU/L (ref 0–40)
Albumin/Globulin Ratio: 1.8 (ref 1.2–2.2)
Albumin: 4.5 g/dL (ref 3.8–4.9)
Alkaline Phosphatase: 144 IU/L — ABNORMAL HIGH (ref 39–117)
BUN/Creatinine Ratio: 21 (ref 10–24)
BUN: 16 mg/dL (ref 8–27)
Bilirubin Total: 0.2 mg/dL (ref 0.0–1.2)
CO2: 25 mmol/L (ref 20–29)
Calcium: 10.1 mg/dL (ref 8.6–10.2)
Chloride: 103 mmol/L (ref 96–106)
Creatinine, Ser: 0.77 mg/dL (ref 0.76–1.27)
GFR calc Af Amer: 114 mL/min/{1.73_m2} (ref >59)
GFR calc non Af Amer: 99 mL/min/{1.73_m2} (ref >59)
Globulin, Total: 2.5 g/dL (ref 1.5–4.5)
Glucose: 235 mg/dL — ABNORMAL HIGH (ref 65–99)
Potassium: 5.6 mmol/L — ABNORMAL HIGH (ref 3.5–5.2)
Sodium: 143 mmol/L (ref 134–144)
Total Protein: 7 g/dL (ref 6.0–8.5)

## 2019-03-17 ENCOUNTER — Encounter: Payer: Self-pay | Admitting: Family Medicine

## 2019-03-17 ENCOUNTER — Other Ambulatory Visit: Payer: Self-pay

## 2019-03-17 ENCOUNTER — Ambulatory Visit (INDEPENDENT_AMBULATORY_CARE_PROVIDER_SITE_OTHER): Payer: BC Managed Care – PPO | Admitting: Family Medicine

## 2019-03-17 VITALS — BP 136/76 | HR 64 | Temp 97.4°F | Ht 68.0 in | Wt 153.0 lb

## 2019-03-17 DIAGNOSIS — E875 Hyperkalemia: Secondary | ICD-10-CM

## 2019-03-17 DIAGNOSIS — E1159 Type 2 diabetes mellitus with other circulatory complications: Secondary | ICD-10-CM | POA: Diagnosis not present

## 2019-03-17 MED ORDER — METFORMIN HCL 500 MG PO TABS: 1000.0000 mg | ORAL_TABLET | Freq: Two times a day (BID) | ORAL | 1 refills | Status: DC

## 2019-03-17 NOTE — Patient Instructions (Addendum)
Increase metfomin to 2 pills in the morning, 1 at night for 1 week.  Then increase to 2 pills twice per day. If diarrhea or stomach upset, go back to lower dose and let me know.   Recheck in 3 weeks - please bring blood sugar readings once per day: Fasting OR 2 hours after meal.   Return to the clinic or go to the nearest emergency room if any of your symptoms worsen or new symptoms occur.   ???????? ?????? ? ????????? ??????????? ?????? Diabetes Mellitus and Standards of Medical Care ??????? ??????? (????????? ???????) ????? ???? ???????. ??????? ? ??? ??????? ????? ??????????? ???????? ??????????? ??????????, ? ?????? ??????? ??????:  ????, ??????? ???????????????? ? ??????? ??????? (????????????).  ???????????? ????????? ??? ???????? ?????.  ?????????.  ???????? ? ?????????? ?? ??????? (??????????????? ????-????????).  ????????????????? ?????????? ?? ??????? (certified diabetes educator, CDE).  ???????????? ?? ???????? ???????????.  ?????????.  ????-???????.  ?????????? ?? ???????????? ????? ? ?????? (???????).  ??????????.  ???? ?????????? ????? (????????).  ?????????? ? ??????? ???????????? ????????. ??? ??????????? ?????????? ???????? ????? ??? ??????????? ????????? ????? ?????????????? ???????????? ?? ?????. ????????????? ????? - ??? ????? ??????????? ??? ?????? ????? ??????? ???????. ??????? ???? ????? ???? ??? ????? ????????? ??????????. ??????????? ???????????? ????? ???????????? ??? ???????? ????????? ??????? ? ?????? ??? ????? ????? ???? ????? ???????? ??? ???????, ????? ???????? ??? ??????????? ? ???????? ???????. ????? ?? ????? ????????? ??????????? ????????????. ????? ???????????? ????? ???????? ?????????:  ????????? ? ??? ?????, ???? ? ??????????? ??????? ????? ???? (???).  ???????? ????????????? ????????. ??? ???????????? ????? ????????? ??? ?????? ??????? ??????????? ???????. ???? ??????? ???????????? ???????? ????? ????????, ? ??????????? ?? ?????  ??????????? ?????????, ???????? ? ?????? ????????.  ???????????? ?????????? ??????.  ?????? ????.  ???????????? ???????????? ??? ????????? ??????????? ?????? (?????????????? ??????????). ??? ????? ???????? ???????? ?????? ?? ????? ? ???????, ???????? ?????? ???????????????? ? ??????? ?????? ??? ? ???????.  ?????? ???????????? ???? ??? ?????? ????????? ? ???? ?????, ??????? ???????? ?? ??????? ???????, ??????, ????????, ??????????? ???????, ??????? ??? ?????? ?????????.  ???????????? ??? ????????? ????????? ?? ??????? ??????????? ??????? (?????????? ?????????), ??????? ????? ???????? ???????? ?????? ?? ????? ? ???????, ? ????? [??] ???????????. ??????? ????? ? ??????????? ?? ?????? ????? ??????? ? ?????????????? ????????????, ??? ????? ????????? ????????? ???????:  ?????? ?? HbA1c (?????????? A1c). ???? ???? ????????? ???????? ?????????? ? ???????? ?????? ?????? (???????) ? ????? ? ??????? ????????? 2-3 ???????. ??? ????????, ????? ? ?????? ????????????? ??????????????? ??? ???? ???????. ???? ???? ????? ?????????: ? ?? ??????? ???? 2 ???? ? ???, ???? ?? ?????????? ????? ????? ???????. ? ??? 4 ???? ? ???, ???? ?? ?? ?????????? ????? ??????? ??? ???? ??? ??????????.  ?????? ?? ??????, ??????? ????? ??????????, ?????????? ????, ?????????? ???? ? ?????? ?????????????. ? ??????? ???????? ???? - ????? 100 ??/?? (5,5 ?????/?). ???? ?? ? ?????? ???????? ????? ??????????, ??????? ???????? ?????????? ????? 70 ??/?? (3,9 ?????/?). ? ??????? ???????? ???? ??? ?????? ?????????? 40 ??/?? (2,2 ?????/?) ??? ???? ? 50 ??/?? (2,8 ?????/?) ??? ???? - ??? ??????. ??????? ??????????? ???? 60 ??/?? (3,3 ?????/?) ??? ???? ??????? ????????? ?????? ?? ????????????? ??????????? ??????. ? ??????? ???????? ????????????? - ????? 150 ??/?? (8,3 ?????/?).  ?????????????? ????? ??????.  ????? ?? ??????? ?????.  ????? ?? ??????? ?????????? ??????. ???????????? ? ??????????? ? ????????  ????????? ??????????? ??? ???? ?  ???.  ???? ? ??? ?????? 1-?? ????, ???? ????? ??????????????? ??? ?????? ???????????? ? ???????? ????? 3-5 ??? ????? ???????????? ????????, ? ????? - ???? ??? ? ???. ? ???? ? ??????? ??????  1-?? ????, ???? ????? ??????????????? ?????? ???????????? ????, ??? ?????? ??????? ?????????? 10 ??? ??? ??????, ? ???? ??????? ? ???? ??????? ???????? 3-5 ???. ????? ??????? ????????????, ??????? ?????? ????????? ???????????? ???? ?????? ???.  ???? ? ??? ?????? 2-?? ????, ???? ????? ??????????????? ??? ?????? ???????????? ? ???????? ????? ????? ???????????? ????????, ? ????? - ???? ??? ? ???. ???????????   ???? ?????????? ???????? ? ???????? 6 ??????? ??? ?????? ????????????? ????????? ???????? ?????????? (?????? ??????) ????????.  ???? ?????????? ???????? ? ???????? 2 ??? ??? ?????? ????????????? ???????? ?????? ????????? (?????????????? ????????). ???? ??? 65 ??? ? ??????, ??? ???????? ????? ???? ????????? ? ???? ????? ?? ???? ????????? ????????.  ????????, ?????? ????? ????, ??? ? ??? ?????????? ??????? ????????? ???????, ????????????? ???????? ???????? ?????? ???????? B.  ??????? ???????? ?????? ???????? ???????? ? ???? ?????? ???? ??????? ????? ??????? ????????? ???????? ?????????? ????????????? ??????? ?? ???????? ? ???????????? ??????????? (Centers for Disease Control and Prevention, CDC). ?????????? ? ????????????? ???????? ?? ????? ???????????? ???????? ? ????? ?? ???? ????????????? ????????????? ???????? ?? ???????? ??????????? ???????? ?????????, ???????????? ? ?????????. ???? ??? ???????????? ? ??? ????????? ????? ???????? (? ??? ????? ??????? ????????????? ????????? ?????????), ????????, ??????????? ?????????? ????????????, ? ??? ????? ????????? ??? ?????????? ? ??????????? ?? ???????????? ????????. ???? ??????? ??? ???? ??????? ????? ??????????? ??? ?????? ????????? ?????. ?? ? ?????? ????????:  ??? ??? ????????? ?????????, ??????? ???????.  ????? ???????? ???????, ??????? ??  ???????????.  ???? ??????? ?????? ??????? ? ?????.  ??????? ???????? ??????? ? ??? ? ?????.  ???????? ?????? ?????? ?????, ????? ??? ??????? ??????????, ? ????? ???????????? ??????, ???????? ???????? ? ??????????. ???????? ?????????????? ???????? ????????? ??????? ???? ??????, ????????? ?????? ?? ????????????? ? ???? ?????? ??????? ? ????? ? ????????? ?? ?? ?????????? ???????. ????? ?? ????? ????????? ??? ?:  ?????????????? ????????????? ?? ????????? ???????, ????? ????????? ???????? ??????? ? ??????? ????? ?????, ??????? ? ??????? ???????????? ????????.  ???????????????? ?????-?????????, ??????? ????? ??????????? ??? ??????????? ??? ?????? ???? ???????.  ??????????? ?? ???????????, ??????? ????? ???????? ??? ??????? ?????????? ? ???? ??????????. ??????  ??????? ??????? (????????? ???????) ????? ???? ???????. ??????? ? ??? ??????? ????? ???? ???????????? ???????? ??????????? ??????????.  ??? ??????????? ?????????? ???????? ????? ??? ??????????? ????????? ????? ?????????????? ???????????? ?? ?????.  ????????? ?????, ??????? ?????????? ?????????? ??????????? ????????, ???????? ?????, ???????? ????????????? ????????, ?????????? ??????????, ????????-?????? ? ???????? ????, ??? ??? ????????.  ?????? ?? ?????? ????????? ????????, ????? ????? ????? ???? ??? ????? ????????? ??????????. ??? ?????????? ?? ????? ???????? ??????, ??????????????? ????? ??????. ??????????? ???????? ????? ???????????? ??? ??????? ? ????? ??????? ??????. Document Released: 08/07/2016 Document Revised: 06/06/2017 Elsevier Patient Education  [employer].    If you have lab work done today you will be contacted with your lab results within the next 2 weeks.  If you have not heard from [place] then please contact us. The fastest way to get your results is to register for My Chart.   IF you received an x-ray today, you will receive an invoice from [facility]. Please contact [facility] at  [phone] with questions or concerns regarding your invoice.   IF you received labwork today, you will receive an invoice from [place]. Please contact [facility] at [phone] with questions or concerns regarding your invoice.   Our billing staff will not be able to assist you with questions regarding bills from these companies.  You will be contacted with the lab results as soon as they are available. The fastest way to get your results is to activate your  My Chart account. Instructions are located on the last page of this paperwork. If you have not heard from [place] regarding the results in 2 weeks, please contact this office.

## 2019-03-17 NOTE — Progress Notes (Signed)
Subjective:    Patient ID: Stanley Chaney, male    DOB: 03-31-1961, 58 y.o.   MRN: [id number]  HPI Stanley Chaney is a 58 y.o. male Presents today for: Chief Complaint  Patient presents with   Diabetes  english spoken with understanding expressed - declined interpreter.   Diabetes: Complicated by peripheral vascular disease. See last office visit June 24.  Was had incident to start any new medicines or check his home readings.  I did ask him to bring in home readings to determine next med.  Uncontrolled A1c 10.8 June 24. Currently on metformin 500 mg twice daily.  No diarrhea or GI intolerance.  No HA/urinary issues/visual disturbance.  Some fatigue.  Has not checked blood sugars at home.  Has been trying to watch diet.   Microalbumin: Optho, foot exam, pneumovax: Due for ophthalmology exam.  Up-to-date on foot exam, A1c, Pneumovax.  Lab Results  Component Value Date   HGBA1C 10.8 (A) 03/03/2019   HGBA1C 9.8 (A) 09/14/2018   HGBA1C 10.4 (H) 06/11/2018   Lab Results  Component Value Date   LDLCALC 107 (H) 03/03/2019   CREATININE 0.77 03/03/2019    Hyperkalemia: No CP/palpitiations.  No supplements.  Lab Results  Component Value Date   K 5.6 (H) 03/03/2019     Patient Active Problem List   Diagnosis Date Noted   PAD (peripheral artery disease) ([place]) 07/20/2018   Malnutrition of moderate degree 06/11/2018   Cellulitis of left foot 06/10/2018   Diabetes mellitus ([place]) 06/10/2018   Hyperglycemia 06/10/2018   Cellulitis 06/10/2018   Past Medical History:  Diagnosis Date   Diabetes mellitus without complication ([place])    PAD (peripheral artery disease) ([place])    Past Surgical History:  Procedure Laterality Date   ABDOMINAL AORTOGRAM W/LOWER EXTREMITY Bilateral 06/15/2018   Procedure: ABDOMINAL AORTOGRAM W/LOWER EXTREMITY;  Surgeon: [name], MD;  Location: [place] [facility];  Service: Cardiovascular;  Laterality: Bilateral;    ABDOMINAL AORTOGRAM W/LOWER EXTREMITY Left 12/30/2018   Procedure: ABDOMINAL AORTOGRAM W/LOWER EXTREMITY;  Surgeon: [name], MD;  Location: [place] [facility];  Service: Cardiovascular;  Laterality: Left;   I&D EXTREMITY Left 06/12/2018   Procedure: Excisional Debridement left foot;  Surgeon: [name], MD;  Location: [facility];  Service: Orthopedics;  Laterality: Left;   LOWER EXTREMITY ANGIOGRAM Right 07/20/2018     Right lower extremity angiogram   LOWER EXTREMITY ANGIOGRAPHY Right 07/20/2018   Procedure: Lower Extremity Angiography;  Surgeon: [name], MD;  Location: [place] [facility];  Service: Cardiovascular;  Laterality: Right;   PERIPHERAL VASCULAR ATHERECTOMY Left 06/15/2018   Procedure: PERIPHERAL VASCULAR ATHERECTOMY;  Surgeon: [name], MD;  Location: [place] [facility];  Service: Cardiovascular;  Laterality: Left;  SFA   PERIPHERAL VASCULAR BALLOON ANGIOPLASTY Left 06/15/2018   Procedure: PERIPHERAL VASCULAR BALLOON ANGIOPLASTY;  Surgeon: [name], MD;  Location: [place] [facility];  Service: Cardiovascular;  Laterality: Left;  SFA   PERIPHERAL VASCULAR BALLOON ANGIOPLASTY Right 07/20/2018   Procedure: PERIPHERAL VASCULAR BALLOON ANGIOPLASTY;  Surgeon: [name], MD;  Location: [place] [facility];  Service: Cardiovascular;  Laterality: Right;  SFA WITH DRUG COATED    PERIPHERAL VASCULAR INTERVENTION Left 12/30/2018   Procedure: PERIPHERAL VASCULAR INTERVENTION;  Surgeon: [name], MD;  Location: [place] [facility];  Service: Cardiovascular;  Laterality: Left;  SFA   No Known Allergies Prior to Admission medications   Medication Sig Start Date End Date Taking?  Authorizing Provider  aspirin 81 MG EC tablet Take 1 tablet (81 mg total) by mouth daily. 07/15/18  Yes Clent Demark, PA-C  blood glucose meter kit and supplies Dispense based on patient and insurance preference. Use up  to four times daily as directed. (FOR ICD-10 E10.9, E11.9). 06/16/18  Yes Domenic Polite, MD  Lancets MISC Inject 1 each into the skin 3 (three) times daily as needed. 09/14/18  Yes Clent Demark, PA-C  metFORMIN (GLUCOPHAGE) 500 MG tablet Take 1 tablet (500 mg total) by mouth 2 (two) times daily with a meal. Patient taking differently: Take 500 mg by mouth 2 (two) times daily.  07/15/18  Yes Clent Demark, PA-C   Social History   Socioeconomic History   Marital status: Married    Spouse name: Not on file   Number of children: Not on file   Years of education: Not on file   Highest education level: Not on file  Occupational History   Not on file  Social Needs   Financial resource strain: Not on file   Food insecurity    Worry: Not on file    Inability: Not on file   Transportation needs    Medical: Not on file    Non-medical: Not on file  Tobacco Use   Smoking status: Former Smoker    Quit date: 06/14/2018    Years since quitting: 0.7   Smokeless tobacco: Never Used   Tobacco comment: pt reports quitting 6 months ago  Substance and Sexual Activity   Alcohol use: Yes    Alcohol/week: 18.0 standard drinks    Types: 18 Cans of beer per week    Comment: pt reports not drinking for past 6 months   Drug use: Never   Sexual activity: Not on file  Lifestyle   Physical activity    Days per week: Not on file    Minutes per session: Not on file   Stress: Not on file  Relationships   Social connections    Talks on phone: Not on file    Gets together: Not on file    Attends religious service: Not on file    Active member of club or organization: Not on file    Attends meetings of clubs or organizations: Not on file    Relationship status: Not on file   Intimate partner violence    Fear of current or ex partner: Not on file    Emotionally abused: Not on file    Physically abused: Not on file    Forced sexual activity: Not on file  Other Topics Concern     Not on file  Social History Narrative   Not on file    Review of Systems  Constitutional: Positive for fatigue (some decreased energy, no acute changes. ). Negative for unexpected weight change.  Eyes: Negative for visual disturbance.  Respiratory: Negative for cough, chest tightness and shortness of breath.   Cardiovascular: Negative for chest pain, palpitations and leg swelling.  Gastrointestinal: Negative for abdominal pain and blood in stool.  Neurological: Negative for dizziness, light-headedness and headaches.   Per HPI.      Objective:   Physical Exam Vitals signs reviewed.  Constitutional:      Appearance: He is well-developed.  HENT:     Head: Normocephalic and atraumatic.  Eyes:     Pupils: Pupils are equal, round, and reactive to light.  Neck:     Vascular: No carotid bruit or JVD.  Cardiovascular:     Rate and Rhythm: Normal rate and regular rhythm.     Heart sounds: Normal heart sounds. No murmur.  Pulmonary:     Effort: Pulmonary effort is normal.     Breath sounds: Normal breath sounds. No rales.  Skin:    General: Skin is warm and dry.  Neurological:     Mental Status: He is alert and oriented to person, place, and time.    Vitals:   03/17/19 1512  BP: 136/76  Pulse: 64  Temp: (!) 97.4 F (36.3 C)  TempSrc: Oral  SpO2: 97%  Weight: 153 lb (69.4 kg)  Height: '5\' 8"'  (1.727 m)       Assessment & Plan:    Shrey Boike is a 58 y.o. male Type 2 diabetes mellitus with other circulatory complication, without long-term current use of insulin ([place]) - Plan: Microalbumin / creatinine urine ratio, Basic metabolic panel, metFORMIN (GLUCOPHAGE) 500 MG tablet  -Unfortunately has not checked home blood sugar readings.  Based on last A1c will need changes in meds.  Has tolerated metformin at current dose, plan for increase to 1000 mg twice daily over the course of 2 weeks.  Potential side effects and risks were discussed, option of decreased dose if  gastrointestinal intolerance  -May still need additional medication including possible SGLT2.  Could consider sulfonylurea depending on cost.   -Recheck next 3 weeks with home readings, advised 1 reading per day either fasting or 2 hours postprandial, handout given on self-care with diabetes and goal readings  -RTC precautions if increasing fatigue, vision changes, other symptoms of significant hyperglycemia.  Hyperkalemia - Plan: Basic metabolic panel  -Repeat on BMP.  Meds ordered this encounter  Medications   metFORMIN (GLUCOPHAGE) 500 MG tablet    Sig: Take 2 tablets (1,000 mg total) by mouth 2 (two) times daily with a meal. intial week - 2 tabs in am, 1 tab in pm. Increase to 2 tabs BID week 2 if tolerated.    Dispense:  360 tablet    Refill:  1   Patient Instructions    Increase metfomin to 2 pills in the morning, 1 at night for 1 week.  Then increase to 2 pills twice per day. If diarrhea or stomach upset, go back to lower dose and let me know.   Recheck in 3 weeks - please bring blood sugar readings once per day: Fasting OR 2 hours after meal.   Return to the clinic or go to the nearest emergency room if any of your symptoms worsen or new symptoms occur.   ???????? ?????? ? ????????? ??????????? ?????? Diabetes Mellitus and Standards of Medical Care ??????? ??????? (????????? ???????) ????? ???? ???????. ??????? ? ??? ??????? ????? ??????????? ???????? ??????????? ??????????, ? ?????? ??????? ??????:  ????, ??????? ???????????????? ? ??????? ??????? (????????????).  ???????????? ????????? ??? ???????? ?????.  ?????????.  ???????? ? ?????????? ?? ??????? (??????????????? ????-????????).  ????????????????? ?????????? ?? ??????? (certified diabetes educator, CDE).  ???????????? ?? ???????? ???????????.  ?????????.  ????-???????.  ?????????? ?? ???????????? ????? ? ?????? (???????).  ??????????.  ???? ?????????? ????? (????????).  ?????????? ? ???????  ???????????? ????????. ??? ??????????? ?????????? ???????? ????? ??? ??????????? ????????? ????? ?????????????? ???????????? ?? ?????. ????????????? ????? -- ??? ????? ??????????? ??? ?????? ????? ??????? ???????. ??????? ???? ????? ???? ??? ????? ????????? ??????????. ??????????? ???????????? ????? ???????????? ??? ???????? ????????? ??????? ? ?????? ??? ????? ????? ???? ????? ???????? ??? ???????, ????? ???????? ??? ??????????? ? ???????? ???????. ????? ?? ????? ????????? ??????????? ????????????. ????? ???????????? ????? ???????? ?????????:  ????????? ? ??? ?????, ???? ? ??????????? ??????? ????? ???? (???).  ???????? ????????????? ????????. ??? ???????????? ????? ????????? ??? ?????? ??????? ??????????? ???????. ???? ??????? ???????????? ???????? ????? ????????, ? ??????????? ?? ????? ??????????? ?????????, ???????? ? ?????? ????????.  ???????????? ?????????? ??????.  ?????? ????.  ???????????? ???????????? ??? ????????? ??????????? ?????? (?????????????? ??????????). ??? ????? ???????? ???????? ?????? ?? ????? ? ???????, ???????? ?????? ???????????????? ? ??????? ?????? ??? ? ???????.  ?????? ???????????? ???? ??? ?????? ????????? ? ???? ?????, ??????? ???????? ?? ??????? ???????, ??????, ????????, ??????????? ???????, ??????? ??? ?????? ?????????.  ???????????? ??? ????????? ????????? ?? ??????? ??????????? ??????? (?????????? ?????????), ??????? ????? ???????? ???????? ?????? ?? ????? ? ???????, ? ????? [??] ???????????. ??????? ????? ? ??????????? ?? ?????? ????? ??????? ? ?????????????? ????????????, ??? ????? ????????? ????????? ???????:  ?????? ??  HbA1c (?????????? A1c). ???? ???? ????????? ???????? ?????????? ? ???????? ?????? ?????? (???????) ? ????? ? ??????? ????????? 2-3 ???????. ??? ????????, ????? ? ?????? ????????????? ??????????????? ??? ???? ???????. ???? ???? ????? ?????????: ? ?? ??????? ???? 2 ???? ? ???, ???? ?? ?????????? ????? ????? ???????. ? ??? 4 ???? ? ???, ????  ?? ?? ?????????? ????? ??????? ??? ???? ??? ??????????.  ?????? ?? ??????, ??????? ????? ??????????, ?????????? ????, ?????????? ???? ? ?????? ?????????????. ? ??????? ???????? ???? -- ????? 100 ??/?? (5,5 ?????/?). ???? ?? ? ?????? ???????? ????? ??????????, ??????? ???????? ?????????? ????? 70 ??/?? (3,9 ?????/?). ? ??????? ???????? ???? ??? ?????? ?????????? 40 ??/?? (2,2 ?????/?) ??? ???? ? 50 ??/?? (2,8 ?????/?) ??? ???? - ??? ??????. ??????? ??????????? ???? 60 ??/?? (3,3 ?????/?) ??? ???? ??????? ????????? ?????? ?? ????????????? ??????????? ??????. ? ??????? ???????? ????????????? -- ????? 150 ??/?? (8,3 ?????/?).  ?????????????? ????? ??????.  ????? ?? ??????? ?????.  ????? ?? ??????? ?????????? ??????. ???????????? ? ??????????? ? ????????  ????????? ??????????? ??? ???? ? ???.  ???? ? ??? ?????? 1-?? ????, ???? ????? ??????????????? ??? ?????? ???????????? ? ???????? ????? 3-5 ??? ????? ???????????? ????????, ? ????? -- ???? ??? ? ???. ? ???? ? ??????? ?????? 1-?? ????, ???? ????? ??????????????? ?????? ???????????? ????, ??? ?????? ??????? ?????????? 10 ??? ??? ??????, ? ???? ??????? ? ???? ??????? ???????? 3-5 ???. ????? ??????? ????????????, ??????? ?????? ????????? ???????????? ???? ?????? ???.  ???? ? ??? ?????? 2-?? ????, ???? ????? ??????????????? ??? ?????? ???????????? ? ???????? ????? ????? ???????????? ????????, ? ????? -- ???? ??? ? ???. ???????????   ???? ?????????? ???????? ? ???????? 6 ??????? ??? ?????? ????????????? ????????? ???????? ?????????? (?????? ??????) ????????.  ???? ?????????? ???????? ? ???????? 2 ??? ??? ?????? ????????????? ???????? ?????? ????????? (?????????????? ????????). ???? ??? 65 ??? ? ??????, ??? ???????? ????? ???? ????????? ? ???? ????? ?? ???? ????????? ????????.  ????????, ?????? ????? ????, ??? ? ??? ?????????? ??????? ????????? ???????, ????????????? ???????? ???????? ?????? ???????? B.  ??????? ???????? ?????? ???????? ???????? ?  ???? ?????? ???? ??????? ????? ??????? ????????? ???????? ?????????? ????????????? ??????? ?? ???????? ? ???????????? ??????????? (Centers for Disease Control and Prevention, CDC). ?????????? ? ????????????? ???????? ?? ????? ???????????? ???????? ? ????? ?? ???? ????????????? ????????????? ???????? ?? ???????? ??????????? ???????? ?????????, ???????????? ? ?????????. ???? ??? ???????????? ? ??? ????????? ????? ???????? (? ??? ????? ??????? ????????????? ????????? ?????????), ????????, ??????????? ?????????? ????????????, ? ??? ????? ????????? ??? ?????????? ? ??????????? ?? ???????????? ????????. ???? ??????? ??? ???? ??????? ????? ??????????? ??? ?????? ????????? ?????. ?? ? ?????? ????????:  ??? ??? ????????? ?????????, ??????? ???????.  ????? ???????? ???????, ??????? ?? ???????????.  ???? ??????? ?????? ??????? ? ?????.  ??????? ???????? ??????? ? ??? ? ?????.  ???????? ?????? ?????? ?????, ????? ??? ??????? ??????????, ? ????? ???????????? ??????, ???????? ???????? ? ??????????. ???????? ?????????????? ???????? ????????? ??????? ???? ??????, ????????? ?????? ?? ????????????? ? ???? ?????? ??????? ? ????? ? ????????? ?? ?? ?????????? ???????. ????? ?? ????? ????????? ??? ?:  ?????????????? ????????????? ?? ????????? ???????, ????? ????????? ???????? ??????? ? ??????? ????? ?????, ??????? ? ??????? ???????????? ????????.  ???????????????? ?????-?????????, ??????? ????? ??????????? ??? ??????????? ??? ?????? ???? ???????.  ??????????? ?? ???????????, ??????? ????? ???????? ??? ??????? ?????????? ? ???? ??????????. ??????  ??????? ??????? (????????? ???????) ????? ???? ???????. ??????? ? ??? ??????? ????? ???? ???????????? ???????? ??????????? ??????????.  ??? ??????????? ?????????? ???????? ????? ??? ??????????? ????????? ????? ?????????????? ???????????? ?? ?????.  ????????? ?????, ??????? ?????????? ?????????? ??????????? ????????, ???????? ?????, ???????? ????????????? ????????,  ?????????? ??????????, ????????-?????? ? ???????? ????, ??? ??? ????????.  ?????? ?? ?????? ????????? ????????, ????? ????? ????? ???? ??? ????? ????????? ??????????. ??? ?????????? ?? ????? ???????? ??????, ??????????????? ????? ??????. ??????????? ???????? ????? ???????????? ??? ??????? ? ????? ??????? ??????.  Document Released: 08/07/2016 Document Revised: 06/06/2017 [employer] Patient Education  [employer].    If you have lab work done today you will be contacted with your lab results within the next 2 weeks.  If you have not heard from [place] then please contact us. The fastest way to get your results is to register for My Chart.   IF you received an x-ray today, you will receive an invoice from [facility]. Please contact [facility] at [phone] with questions or concerns regarding your invoice.   IF you received labwork today, you will receive an invoice from [facility]. Please contact [facility] at [phone] with questions or concerns regarding your invoice.   Our billing staff will not be able to assist you with questions regarding bills from these companies.  You will be contacted with the lab results as soon as they are available. The fastest way to get your results is to activate your My Chart account. Instructions are located on the last page of this paperwork. If you have not heard from [place] regarding the results in 2 weeks, please contact this office.      Signed,   [name], MD [facility].  03/17/19 7:33 PM

## 2019-03-18 LAB — BASIC METABOLIC PANEL
BUN/Creatinine Ratio: 20 (ref 10–24)
BUN: 17 mg/dL (ref 8–27)
CO2: 23 mmol/L (ref 20–29)
Calcium: 9.9 mg/dL (ref 8.6–10.2)
Chloride: 101 mmol/L (ref 96–106)
Creatinine, Ser: 0.83 mg/dL (ref 0.76–1.27)
GFR calc Af Amer: 111 mL/min/{1.73_m2} (ref >59)
GFR calc non Af Amer: 96 mL/min/{1.73_m2} (ref >59)
Glucose: 275 mg/dL — ABNORMAL HIGH (ref 65–99)
Potassium: 5 mmol/L (ref 3.5–5.2)
Sodium: 141 mmol/L (ref 134–144)

## 2019-03-18 LAB — MICROALBUMIN / CREATININE URINE RATIO
Creatinine, Urine: 72.8 mg/dL
Microalb/Creat Ratio: 8 mg/g creat (ref 0–29)
Microalbumin, Urine: 6 ug/mL

## 2019-04-07 ENCOUNTER — Encounter: Payer: Self-pay | Admitting: Family Medicine

## 2019-04-07 ENCOUNTER — Ambulatory Visit (INDEPENDENT_AMBULATORY_CARE_PROVIDER_SITE_OTHER): Payer: BC Managed Care – PPO | Admitting: Family Medicine

## 2019-04-07 ENCOUNTER — Other Ambulatory Visit: Payer: Self-pay

## 2019-04-07 VITALS — BP 127/71 | HR 66 | Temp 98.2°F | Resp 12 | Wt 149.8 lb

## 2019-04-07 DIAGNOSIS — E785 Hyperlipidemia, unspecified: Secondary | ICD-10-CM

## 2019-04-07 DIAGNOSIS — E1159 Type 2 diabetes mellitus with other circulatory complications: Secondary | ICD-10-CM | POA: Diagnosis not present

## 2019-04-07 LAB — GLUCOSE, POCT (MANUAL RESULT ENTRY): POC Glucose: 196 mg/dl — AB (ref 70–99)

## 2019-04-07 MED ORDER — JARDIANCE 10 MG PO TABS: 10.0000 mg | ORAL_TABLET | Freq: Every day | ORAL | 2 refills | Status: DC

## 2019-04-07 MED ORDER — ATORVASTATIN CALCIUM 10 MG PO TABS: 10.0000 mg | ORAL_TABLET | Freq: Every day | ORAL | 0 refills | Status: DC

## 2019-04-07 NOTE — Patient Instructions (Addendum)
   Start lipitor once per day and recheck cholesterol in 6 weeks. If any new side effects - return to discuss.  Continue metformin twice per day for diabetes and watch diet.  Start jardiance once per day for diabetes. Recheck in 6 weeks.   Return to the clinic or go to the nearest emergency room if any of your symptoms worsen or new symptoms occur.      If you have lab work done today you will be contacted with your lab results within the next 2 weeks.  If you have not heard from [place] then please contact us. The fastest way to get your results is to register for My Chart.   IF you received an x-ray today, you will receive an invoice from [facility]. Please contact [facility] at [phone] with questions or concerns regarding your invoice.   IF you received labwork today, you will receive an invoice from [place]. Please contact [facility] at [phone] with questions or concerns regarding your invoice.   Our billing staff will not be able to assist you with questions regarding bills from these companies.  You will be contacted with the lab results as soon as they are available. The fastest way to get your results is to activate your My Chart account. Instructions are located on the last page of this paperwork. If you have not heard from [place] regarding the results in 2 weeks, please contact this office.

## 2019-04-07 NOTE — Progress Notes (Signed)
Subjective:    Patient ID: Stanley Chaney, male    DOB: 01/07/1961, 58 y.o.   MRN: [id number]  HPI  Stanley Chaney is a 58 y.o. male Presents today for: Chief Complaint  Patient presents with  . Diabetes    patient is here for his 3 week f/u on diabetes. Patient was informed at last visit to itake is metformin as followed-. 2 pills in the am and 1 pill in the pm for 1 week then 2 tab per day thereafter. Patient statede he is feeling much better now   Diabetes: With hyperglycemia, PVD.  Elevated A1c in June.  Microalbumin: Optho, foot exam, pneumovax:  Lab Results  Component Value Date   HGBA1C 10.8 (A) 03/03/2019   HGBA1C 9.8 (A) 09/14/2018   HGBA1C 10.4 (H) 06/11/2018   Lab Results  Component Value Date   LDLCALC 107 (H) 03/03/2019   CREATININE 0.83 03/17/2019   See last office visit July 8.  Uncontrolled diabetes, was tolerating metformin, plan for 1000 mg dosing. Home readings: not checking - has meter, but not checking - unknown reason - no barriers. Has strips, denies testing fear.  Feels better with higher dosing of metformin. More energy.  No HA, blurry vision, abd pain or new sx's.  Last ate at 8 am - fasting at present.  Plans to continue to work on diet, but agrees to statin.  Hx of PVD.   The 10-year ASCVD risk score Mikey Bussing DC Jr., et al., 2013) is: 17.7%   Values used to calculate the score:     Age: 34 years     Sex: Male     Is Non-Hispanic African American: No     Diabetic: Yes     Tobacco smoker: No     Systolic Blood Pressure: 800 mmHg     Is BP treated: No     HDL Cholesterol: 41 mg/dL     Total Cholesterol: 196 mg/dL  Depression screen [facility] 2/9 04/07/2019 03/17/2019 03/03/2019 11/12/2018 09/14/2018  Decreased Interest 0 0 0 3 -  Down, Depressed, Hopeless 0 0 0 3 1  PHQ - 2 Score 0 0 0 6 1  Altered sleeping - - - 3 -  Tired, decreased energy - - - 3 -  Change in appetite - - - 0 -  Feeling bad or failure about yourself  - - - 0 -  Trouble  concentrating - - - 0 -  Moving slowly or fidgety/restless - - - 0 -  Suicidal thoughts - - - 0 -  PHQ-9 Score - - - 12 -      Results for orders placed or performed in visit on 03/17/19  Microalbumin / creatinine urine ratio  Result Value Ref Range   Creatinine, Urine 72.8 Not Estab. mg/dL   Microalbumin, Urine 6.0 Not Estab. ug/mL   Microalb/Creat Ratio 8 0 - 29 mg/g creat  Basic metabolic panel  Result Value Ref Range   Glucose 275 (H) 65 - 99 mg/dL   BUN 17 8 - 27 mg/dL   Creatinine, Ser 0.83 0.76 - 1.27 mg/dL   GFR calc non Af Amer 96 >59 mL/min/1.73   GFR calc Af Amer 111 >59 mL/min/1.73   BUN/Creatinine Ratio 20 10 - 24   Sodium 141 134 - 144 mmol/L   Potassium 5.0 3.5 - 5.2 mmol/L   Chloride 101 96 - 106 mmol/L   CO2 23 20 - 29 mmol/L   Calcium 9.9 8.6 - 10.2 mg/dL  Patient Active Problem List   Diagnosis Date Noted  . PAD (peripheral artery disease) ([place]) 07/20/2018  . Malnutrition of moderate degree 06/11/2018  . Cellulitis of left foot 06/10/2018  . Diabetes mellitus ([place]) 06/10/2018  . Hyperglycemia 06/10/2018  . Cellulitis 06/10/2018   Past Medical History:  Diagnosis Date  . Diabetes mellitus without complication ([place])   . PAD (peripheral artery disease) ([place])    Past Surgical History:  Procedure Laterality Date  . ABDOMINAL AORTOGRAM W/LOWER EXTREMITY Bilateral 06/15/2018   Procedure: ABDOMINAL AORTOGRAM W/LOWER EXTREMITY;  Surgeon: [name], MD;  Location: [place] [facility];  Service: Cardiovascular;  Laterality: Bilateral;  . ABDOMINAL AORTOGRAM W/LOWER EXTREMITY Left 12/30/2018   Procedure: ABDOMINAL AORTOGRAM W/LOWER EXTREMITY;  Surgeon: [name], MD;  Location: [place] [facility];  Service: Cardiovascular;  Laterality: Left;  . I&D EXTREMITY Left 06/12/2018   Procedure: Excisional Debridement left foot;  Surgeon: [name], MD;  Location: [facility];  Service: Orthopedics;  Laterality: Left;  . LOWER  EXTREMITY ANGIOGRAM Right 07/20/2018     Right lower extremity angiogram  . LOWER EXTREMITY ANGIOGRAPHY Right 07/20/2018   Procedure: Lower Extremity Angiography;  Surgeon: [name], MD;  Location: [place] [facility];  Service: Cardiovascular;  Laterality: Right;  . PERIPHERAL VASCULAR ATHERECTOMY Left 06/15/2018   Procedure: PERIPHERAL VASCULAR ATHERECTOMY;  Surgeon: [name], MD;  Location: [place] [facility];  Service: Cardiovascular;  Laterality: Left;  SFA  . PERIPHERAL VASCULAR BALLOON ANGIOPLASTY Left 06/15/2018   Procedure: PERIPHERAL VASCULAR BALLOON ANGIOPLASTY;  Surgeon: [name], MD;  Location: [place] [facility];  Service: Cardiovascular;  Laterality: Left;  SFA  . PERIPHERAL VASCULAR BALLOON ANGIOPLASTY Right 07/20/2018   Procedure: PERIPHERAL VASCULAR BALLOON ANGIOPLASTY;  Surgeon: [name], MD;  Location: [place] [facility];  Service: Cardiovascular;  Laterality: Right;  SFA WITH DRUG COATED   . PERIPHERAL VASCULAR INTERVENTION Left 12/30/2018   Procedure: PERIPHERAL VASCULAR INTERVENTION;  Surgeon: [name], MD;  Location: [place] [facility];  Service: Cardiovascular;  Laterality: Left;  SFA   No Known Allergies Prior to Admission medications   Medication Sig Start Date End Date Taking? Authorizing Provider  aspirin 81 MG EC tablet Take 1 tablet (81 mg total) by mouth daily. 07/15/18  Yes Clent Demark, PA-C  blood glucose meter kit and supplies Dispense based on patient and insurance preference. Use up to four times daily as directed. (FOR ICD-10 E10.9, E11.9). 06/16/18  Yes Domenic Polite, MD  Lancets MISC Inject 1 each into the skin 3 (three) times daily as needed. 09/14/18  Yes Clent Demark, PA-C  metFORMIN (GLUCOPHAGE) 500 MG tablet Take 2 tablets (1,000 mg total) by mouth 2 (two) times daily with a meal. intial week - 2 tabs in am, 1 tab in pm. Increase to 2 tabs BID week 2 if  tolerated. 03/17/19  Yes Wendie Agreste, MD   Social History   Socioeconomic History  . Marital status: Married    Spouse name: Not on file  . Number of children: Not on file  . Years of education: Not on file  . Highest education level: Not on file  Occupational History  . Not on file  Social Needs  . Financial resource strain: Not on file  . Food insecurity    Worry: Not on file    Inability: Not on file  . Transportation needs    Medical: Not on file  Non-medical: Not on file  Tobacco Use  . Smoking status: Former Smoker    Quit date: 06/14/2018    Years since quitting: 0.8  . Smokeless tobacco: Never Used  . Tobacco comment: pt reports quitting 6 months ago  Substance and Sexual Activity  . Alcohol use: Yes    Alcohol/week: 18.0 standard drinks    Types: 18 Cans of beer per week    Comment: pt reports not drinking for past 6 months  . Drug use: Never  . Sexual activity: Not on file  Lifestyle  . Physical activity    Days per week: Not on file    Minutes per session: Not on file  . Stress: Not on file  Relationships  . Social Herbalist on phone: Not on file    Gets together: Not on file    Attends religious service: Not on file    Active member of club or organization: Not on file    Attends meetings of clubs or organizations: Not on file    Relationship status: Not on file  . Intimate partner violence    Fear of current or ex partner: Not on file    Emotionally abused: Not on file    Physically abused: Not on file    Forced sexual activity: Not on file  Other Topics Concern  . Not on file  Social History Narrative  . Not on file    Review of Systems Per HPI.     Objective:   Physical Exam Vitals signs reviewed.  Constitutional:      Appearance: He is well-developed.  HENT:     Head: Normocephalic and atraumatic.  Eyes:     Pupils: Pupils are equal, round, and reactive to light.  Neck:     Vascular: No carotid bruit or JVD.   Cardiovascular:     Rate and Rhythm: Normal rate and regular rhythm.     Heart sounds: Normal heart sounds. No murmur.  Pulmonary:     Effort: Pulmonary effort is normal.     Breath sounds: Normal breath sounds. No rales.  Skin:    General: Skin is warm and dry.  Neurological:     Mental Status: He is alert and oriented to person, place, and time.    Vitals:   04/07/19 1531  BP: 127/71  Pulse: 66  Resp: 12  Temp: 98.2 F (36.8 C)  TempSrc: Oral  SpO2: 98%  Weight: 149 lb 12.8 oz (67.9 kg)    Results for orders placed or performed in visit on 04/07/19  POCT glucose (manual entry)  Result Value Ref Range   POC Glucose 196 (A) 70 - 99 mg/dl         Assessment & Plan:   Obi Scrima is a 58 y.o. male Type 2 diabetes mellitus with other circulatory complication, without long-term current use of insulin ([place]) - Plan: POCT glucose (manual entry), empagliflozin (JARDIANCE) 10 MG TABS tablet  -Improving with higher dose of metformin, still decreased control based on in office lab work.  Continue metformin 1000 mg twice daily, added Jardiance as may have additional cardiovascular benefit with history of PVD.  Potential mycotic infection risk discussed with RTC precautions.  Understanding expressed.  Recheck 6 weeks  Hyperlipidemia, unspecified hyperlipidemia type - Plan: atorvastatin (LIPITOR) 10 MG tablet  -Previous labs reviewed, and with PVD and diabetes will start Lipitor 10 mg daily. Recheck 6 weeks. Potential side effects discussed.   Meds ordered this encounter  Medications  . atorvastatin (LIPITOR) 10 MG tablet    Sig: Take 1 tablet (10 mg total) by mouth daily.    Dispense:  90 tablet    Refill:  0  . empagliflozin (JARDIANCE) 10 MG TABS tablet    Sig: Take 10 mg by mouth daily.    Dispense:  30 tablet    Refill:  2   Patient Instructions     Start lipitor once per day and recheck cholesterol in 6 weeks. If any new side effects - return to discuss.   Continue metformin twice per day for diabetes and watch diet.  Start jardiance once per day for diabetes. Recheck in 6 weeks.   Return to the clinic or go to the nearest emergency room if any of your symptoms worsen or new symptoms occur.      If you have lab work done today you will be contacted with your lab results within the next 2 weeks.  If you have not heard from [place] then please contact us. The fastest way to get your results is to register for My Chart.   IF you received an x-ray today, you will receive an invoice from [facility]. Please contact [facility] at [phone] with questions or concerns regarding your invoice.   IF you received labwork today, you will receive an invoice from [place]. Please contact [facility] at [phone] with questions or concerns regarding your invoice.   Our billing staff will not be able to assist you with questions regarding bills from these companies.  You will be contacted with the lab results as soon as they are available. The fastest way to get your results is to activate your My Chart account. Instructions are located on the last page of this paperwork. If you have not heard from [place] regarding the results in 2 weeks, please contact this office.       Signed,   [name], MD [facility] [place].  04/07/19 10:49 PM

## 2019-04-13 ENCOUNTER — Other Ambulatory Visit: Payer: Self-pay

## 2019-04-13 ENCOUNTER — Ambulatory Visit
Admission: RE | Admit: 2019-04-13 | Discharge: 2019-04-13 | Disposition: A | Discharge status: Home / Self Care | Payer: BC Managed Care – PPO | Source: Ambulatory Visit | Attending: Family Medicine | Admitting: Family Medicine

## 2019-04-13 DIAGNOSIS — R911 Solitary pulmonary nodule: Secondary | ICD-10-CM

## 2019-04-20 ENCOUNTER — Other Ambulatory Visit: Payer: Self-pay | Admitting: Family Medicine

## 2019-04-20 DIAGNOSIS — E041 Nontoxic single thyroid nodule: Secondary | ICD-10-CM

## 2019-04-20 NOTE — Progress Notes (Signed)
See lung ct report.

## 2019-04-28 ENCOUNTER — Ambulatory Visit
Admission: RE | Admit: 2019-04-28 | Discharge: 2019-04-28 | Disposition: A | Discharge status: Home / Self Care | Payer: BC Managed Care – PPO | Source: Ambulatory Visit | Attending: Family Medicine | Admitting: Family Medicine

## 2019-04-28 DIAGNOSIS — E041 Nontoxic single thyroid nodule: Secondary | ICD-10-CM | POA: Diagnosis not present

## 2019-05-19 ENCOUNTER — Encounter: Payer: Self-pay | Admitting: Family Medicine

## 2019-05-19 ENCOUNTER — Ambulatory Visit (INDEPENDENT_AMBULATORY_CARE_PROVIDER_SITE_OTHER): Payer: BC Managed Care – PPO | Admitting: Family Medicine

## 2019-05-19 ENCOUNTER — Other Ambulatory Visit: Payer: Self-pay

## 2019-05-19 VITALS — BP 132/72 | HR 65 | Temp 98.3°F | Resp 12 | Wt 148.4 lb

## 2019-05-19 DIAGNOSIS — E785 Hyperlipidemia, unspecified: Secondary | ICD-10-CM

## 2019-05-19 DIAGNOSIS — E1159 Type 2 diabetes mellitus with other circulatory complications: Secondary | ICD-10-CM | POA: Diagnosis not present

## 2019-05-19 DIAGNOSIS — Z23 Encounter for immunization: Secondary | ICD-10-CM

## 2019-05-19 NOTE — Patient Instructions (Addendum)
   Check your meds at home - you should be taking lipitor once per day.  Let me know if that is not at home and I can send it in again.   I will check labs and let you know if any changes needed.   I will refer you to eye doctor for diabetes screening.   Recheck in 3 months. Thanks for coming in today.   If you have lab work done today you will be contacted with your lab results within the next 2 weeks.  If you have not heard from [place] then please contact us. The fastest way to get your results is to register for My Chart.   IF you received an x-ray today, you will receive an invoice from [facility]. Please contact [facility] at [phone] with questions or concerns regarding your invoice.   IF you received labwork today, you will receive an invoice from [place]. Please contact [facility] at [phone] with questions or concerns regarding your invoice.   Our billing staff will not be able to assist you with questions regarding bills from these companies.  You will be contacted with the lab results as soon as they are available. The fastest way to get your results is to activate your My Chart account. Instructions are located on the last page of this paperwork. If you have not heard from [place] regarding the results in 2 weeks, please contact this office.

## 2019-05-19 NOTE — Progress Notes (Signed)
Subjective:    Patient ID: Stanley Chaney, male    DOB: December 05, 1960, 58 y.o.   MRN: [id number]  HPI Stanley Chaney is a 58 y.o. male Presents today for: Chief Complaint  Patient presents with  . Diabetes     6 week f/u on diabetes   Repeatedly declines video translator. Reports he understands my questions and plan with repeat multiple times.   Diabetes: Complicated by peripheral arterial disease, and hyperglycemia. Last discussed July 29.  Improved with higher dose of metformin but still decreased control.  Jardiance added 10 mg daily, started on Lipitor 10 mg daily - not sure he is taking.  On metformin 1052m BID.  Still not checking home readings.  No subjective hypoglycemia.  Feels better - more energy with walking and feeling better in toes.  Microalbumin: Normal ratio July 8  Optho, foot exam, pneumovax: Due for ophthalmology exam.  Lab Results  Component Value Date   HGBA1C 10.8 (A) 03/03/2019   HGBA1C 9.8 (A) 09/14/2018   HGBA1C 10.4 (H) 06/11/2018   Lab Results  Component Value Date   LDLCALC 107 (H) 03/03/2019   CREATININE 0.83 03/17/2019    Patient Active Problem List   Diagnosis Date Noted  . PAD [place] disease) ([place] 07/20/2018  . Malnutrition of moderate degree 06/11/2018  . Cellulitis of left foot 06/10/2018  . Diabetes mellitus ([place] 06/10/2018  . Hyperglycemia 06/10/2018  . Cellulitis 06/10/2018   Past Medical History:  Diagnosis Date  . Diabetes mellitus without complication ([place]   . P[place] (peripheral artery disease) ([place]    Past Surgical History:  Procedure Laterality Date  . ABDOMINAL AORTOGRAM W/LOWER EXTREMITY Bilateral 06/15/2018   Procedure: ABDOMINAL AORTOGRAM W/LOWER EXTREMITY;  Surgeon: CWaynetta Sandy MD;  [facility];  Service: Cardiovascular;  Laterality: Bilateral;  . ABDOMINAL AORTOGRAM W/LOWER EXTREMITY Left 12/30/2018   Procedure: ABDOMINAL AORTOGRAM W/LOWER EXTREMITY;  Surgeon:  CWaynetta Sandy MD;  [place] [place];  Service: Cardiovascular;  Laterality: Left;  . I&D EXTREMITY Left 06/12/2018   Procedure: Excisional Debridement left foot;  Surgeon: GDorna Leitz MD;  [facility];  Service: Orthopedics;  Laterality: Left;  . LOWER EXTREMITY ANGIOGRAM Right 07/20/2018     Right lower extremity angiogram  . LOWER EXTREMITY ANGIOGRAPHY Right 07/20/2018   Procedure: Lower Extremity Angiography;  Surgeon: CWaynetta Sandy MD;  [facility];  Service: Cardiovascular;  Laterality: Right;  . PERIPHERAL VASCULAR ATHERECTOMY Left 06/15/2018   Procedure: PERIPHERAL VASCULAR ATHERECTOMY;  Surgeon: CWaynetta Sandy MD;  [facility];  Service: Cardiovascular;  Laterality: Left;  SFA  . PERIPHERAL VASCULAR BALLOON ANGIOPLASTY Left 06/15/2018   Procedure: PERIPHERAL VASCULAR BALLOON ANGIOPLASTY;  Surgeon: CWaynetta Sandy MD;  [facility];  Service: Cardiovascular;  Laterality: Left;  SFA  . PERIPHERAL VASCULAR BALLOON ANGIOPLASTY Right 07/20/2018   Procedure: PERIPHERAL VASCULAR BALLOON ANGIOPLASTY;  Surgeon: CWaynetta Sandy MD;  [place];  Service: Cardiovascular;  Laterality: Right;  SFA WITH DRUG COATED   . PERIPHERAL VASCULAR INTERVENTION Left 12/30/2018   Procedure: PERIPHERAL VASCULAR INTERVENTION;  Surgeon: CWaynetta Sandy MD;  [place];  Service: Cardiovascular;  Laterality: Left;  SFA   No Known Allergies Prior to Admission medications   Medication Sig Start Date End Date Taking? Authorizing Provider  aspirin 81 MG EC tablet Take 1 tablet (81 mg total) by mouth daily. 07/15/18  Yes GDomenica Fail  Shanon Brow, PA-C  atorvastatin (LIPITOR) 10 MG tablet Take 1 tablet (10 mg total) by mouth daily. 04/07/19  Yes Wendie Agreste, MD  blood glucose meter kit and supplies Dispense based on patient and insurance preference. Use  up to four times daily as directed. (FOR ICD-10 E10.9, E11.9). 06/16/18  Yes Domenic Polite, MD  empagliflozin (JARDIANCE) 10 MG TABS tablet Take 10 mg by mouth daily. 04/07/19  Yes Wendie Agreste, MD  Lancets MISC Inject 1 each into the skin 3 (three) times daily as needed. 09/14/18  Yes Clent Demark, PA-C  metFORMIN (GLUCOPHAGE) 500 MG tablet Take 2 tablets (1,000 mg total) by mouth 2 (two) times daily with a meal. intial week - 2 tabs in am, 1 tab in pm. Increase to 2 tabs BID week 2 if tolerated. 03/17/19  Yes Wendie Agreste, MD   Social History   Socioeconomic History  . Marital status: Married    Spouse name: Not on file  . Number of children: Not on file  . Years of education: Not on file  . Highest education level: Not on file  Occupational History  . Not on file  Social Needs  . Financial resource strain: Not on file  . Food insecurity    Worry: Not on file    Inability: Not on file  . Transportation needs    Medical: Not on file    Non-medical: Not on file  Tobacco Use  . Smoking status: Former Smoker    Quit date: 06/14/2018    Years since quitting: 0.9  . Smokeless tobacco: Never Used  . Tobacco comment: pt reports quitting 6 months ago  Substance and Sexual Activity  . Alcohol use: Yes    Alcohol/week: 18.0 standard drinks    Types: 18 Cans of beer per week    Comment: pt reports not drinking for past 6 months  . Drug use: Never  . Sexual activity: Not on file  Lifestyle  . Physical activity    Days per week: Not on file    Minutes per session: Not on file  . Stress: Not on file  Relationships  . Social Herbalist on phone: Not on file    Gets together: Not on file    Attends religious service: Not on file    Active member of club or organization: Not on file    Attends meetings of clubs or organizations: Not on file    Relationship status: Not on file  . Intimate partner violence    Fear of current or ex partner: Not on file     Emotionally abused: Not on file    Physically abused: Not on file    Forced sexual activity: Not on file  Other Topics Concern  . Not on file  Social History Narrative  . Not on file    Review of Systems  Eyes: Negative for visual disturbance.  Respiratory: Negative for cough, chest tightness and shortness of breath.   Cardiovascular: Negative for chest pain, palpitations and leg swelling.  Gastrointestinal: Negative for abdominal pain and blood in stool.  Neurological: Negative for dizziness, light-headedness and headaches.   Per HPI.      Objective:   Physical Exam Vitals signs reviewed.  Constitutional:      Appearance: He is well-developed.  HENT:     Head: Normocephalic and atraumatic.  Eyes:     Pupils: Pupils are equal, round, and reactive to light.  Neck:  Vascular: No carotid bruit or JVD.  Cardiovascular:     Rate and Rhythm: Normal rate and regular rhythm.     Heart sounds: Normal heart sounds. No murmur.  Pulmonary:     Effort: Pulmonary effort is normal.     Breath sounds: Normal breath sounds. No rales.  Skin:    General: Skin is warm and dry.  Neurological:     Mental Status: He is alert and oriented to person, place, and time.    Vitals:   05/19/19 1646  BP: 132/72  Pulse: 65  Resp: 12  Temp: 98.3 F (36.8 C)  TempSrc: Oral  SpO2: 98%  Weight: 148 lb 6.4 oz (67.3 kg)      Assessment & Plan:  Stanley Chaney is a 58 y.o. male Type 2 diabetes mellitus with other circulatory complication, without long-term current use of insulin ([place]) - Plan: Comprehensive metabolic panel, Ambulatory referral to Ophthalmology, Hemoglobin A1c  -Refer to ophthalmology.  Continue same regimen, check A1c.  Need for prophylactic vaccination and inoculation against influenza - Plan: Flu Vaccine QUAD 6+ mos PF IM (Fluarix Quad PF)  Hyperlipidemia, unspecified hyperlipidemia type - Plan: Lipid panel  -Unsure if he is taking atorvastatin.  Should be on that  daily.  Recheck 3 months.  Offered and recommended video interpreter, instructions and history had to be clarified multiple times.  Ultimately he expressed understanding of plan, but did recommend use of interpreter in future visits if difficulty with understanding.   No orders of the defined types were placed in this encounter.  Patient Instructions     Check your meds at home - you should be taking lipitor once per day.  Let me know if that is not at home and I can send it in again.   I will check labs and let you know if any changes needed.   I will refer you to eye doctor for diabetes screening.   Recheck in 3 months. Thanks for coming in today.   If you have lab work done today you will be contacted with your lab results within the next 2 weeks.  If you have not heard from [place] then please contact us. The fastest way to get your results is to register for My Chart.   IF you received an x-ray today, you will receive an invoice from [facility]. Please contact [facility] at [phone] with questions or concerns regarding your invoice.   IF you received labwork today, you will receive an invoice from [place]. Please contact [facility] at [phone] with questions or concerns regarding your invoice.   Our billing staff will not be able to assist you with questions regarding bills from these companies.  You will be contacted with the lab results as soon as they are available. The fastest way to get your results is to activate your My Chart account. Instructions are located on the last page of this paperwork. If you have not heard from [place] regarding the results in 2 weeks, please contact this office.       Signed,   [name], MD [facility] [place].  05/22/19 12:17 PM

## 2019-05-20 LAB — COMPREHENSIVE METABOLIC PANEL
ALT: 15 IU/L (ref 0–44)
AST: 13 IU/L (ref 0–40)
Albumin/Globulin Ratio: 1.8 (ref 1.2–2.2)
Albumin: 4.8 g/dL (ref 3.8–4.9)
Alkaline Phosphatase: 152 IU/L — ABNORMAL HIGH (ref 39–117)
BUN/Creatinine Ratio: 17 (ref 10–24)
BUN: 16 mg/dL (ref 8–27)
Bilirubin Total: 0.4 mg/dL (ref 0.0–1.2)
CO2: 22 mmol/L (ref 20–29)
Calcium: 10.1 mg/dL (ref 8.6–10.2)
Chloride: 98 mmol/L (ref 96–106)
Creatinine, Ser: 0.96 mg/dL (ref 0.76–1.27)
GFR calc Af Amer: 99 mL/min/{1.73_m2} (ref >59)
GFR calc non Af Amer: 86 mL/min/{1.73_m2} (ref >59)
Globulin, Total: 2.6 g/dL (ref 1.5–4.5)
Glucose: 245 mg/dL — ABNORMAL HIGH (ref 65–99)
Potassium: 4.8 mmol/L (ref 3.5–5.2)
Sodium: 139 mmol/L (ref 134–144)
Total Protein: 7.4 g/dL (ref 6.0–8.5)

## 2019-05-20 LAB — HEMOGLOBIN A1C
Est. average glucose Bld gHb Est-mCnc: 252 mg/dL
Hgb A1c MFr Bld: 10.4 % — ABNORMAL HIGH (ref 4.8–5.6)

## 2019-05-20 LAB — LIPID PANEL
Chol/HDL Ratio: 2.9 ratio (ref 0.0–5.0)
Cholesterol, Total: 137 mg/dL (ref 100–199)
HDL: 47 mg/dL (ref >39)
LDL Chol Calc (NIH): 66 mg/dL (ref 0–99)
Triglycerides: 140 mg/dL (ref 0–149)
VLDL Cholesterol Cal: 24 mg/dL (ref 5–40)

## 2019-05-26 ENCOUNTER — Other Ambulatory Visit: Payer: Self-pay

## 2019-05-26 DIAGNOSIS — I779 Disorder of arteries and arterioles, unspecified: Secondary | ICD-10-CM

## 2019-05-27 ENCOUNTER — Ambulatory Visit (INDEPENDENT_AMBULATORY_CARE_PROVIDER_SITE_OTHER)
Admission: RE | Admit: 2019-05-27 | Discharge: 2019-05-27 | Disposition: A | Discharge status: Home / Self Care | Payer: BC Managed Care – PPO | Source: Ambulatory Visit | Attending: Family | Admitting: Family

## 2019-05-27 ENCOUNTER — Ambulatory Visit (INDEPENDENT_AMBULATORY_CARE_PROVIDER_SITE_OTHER): Payer: BC Managed Care – PPO | Admitting: Family

## 2019-05-27 ENCOUNTER — Encounter: Payer: Self-pay | Admitting: Family

## 2019-05-27 ENCOUNTER — Other Ambulatory Visit: Payer: Self-pay

## 2019-05-27 ENCOUNTER — Ambulatory Visit (HOSPITAL_COMMUNITY)
Admission: RE | Admit: 2019-05-27 | Discharge: 2019-05-27 | Disposition: A | Discharge status: Home / Self Care | Payer: BC Managed Care – PPO | Source: Ambulatory Visit | Attending: Family | Admitting: Family

## 2019-05-27 VITALS — BP 145/74 | HR 62 | Temp 97.8°F | Resp 14 | Ht 67.0 in | Wt 147.7 lb

## 2019-05-27 DIAGNOSIS — I779 Disorder of arteries and arterioles, unspecified: Secondary | ICD-10-CM

## 2019-05-27 DIAGNOSIS — E1151 Type 2 diabetes mellitus with diabetic peripheral angiopathy without gangrene: Secondary | ICD-10-CM

## 2019-05-27 DIAGNOSIS — F17200 Nicotine dependence, unspecified, uncomplicated: Secondary | ICD-10-CM

## 2019-05-27 DIAGNOSIS — E1165 Type 2 diabetes mellitus with hyperglycemia: Secondary | ICD-10-CM

## 2019-05-27 DIAGNOSIS — IMO0002 Reserved for concepts with insufficient information to code with codable children: Secondary | ICD-10-CM

## 2019-05-27 MED ORDER — CLOPIDOGREL BISULFATE 75 MG PO TABS: 75.0000 mg | ORAL_TABLET | Freq: Every day | ORAL | 6 refills | Status: DC

## 2019-05-27 NOTE — Patient Instructions (Signed)
???? ??? ?????? ?? ??????? Steps to Quit Smoking ??????? ?????? ???????? ???????? ???????? ??????, ??????? ????? ?????????????. ??? ????? ???????? ????? ?????? ????? ? ?????????. ??????? ?????????? ??? ? ?????????? ????? ????? ???????? ?????? ????????? ??????????? ???????????. ??????? ?????? ????? ???? ??????, ?? ??? ?????????, ??? ?? ?????? ??????? ??? ?????? ????????. ??????? ?? ?????? ??????? ??????. ??? ????????????? ? ?????? ?? ???????? ????? ?? ?????? ?????????? ?? ???????, ???????? ????, ??????? ???????? ??????? ??????. ????? ??????? ?? ???????:  ???????? ???? ?????? ?? ???????????? ??????. ?????????? ???? ? ???????? ????????? 2 ??????, ??????? ?????, ??????? ???????? ??? ?????????????.  ???????? ???????, ?????? ?? ???????? ??????????? ?????. ??????? ???? ?????? ???, ??? ?? ????? ????? ?????????? ??? ?? ?????.  ???????? ????? ?????????????, ???????, ???????????, ??? ?? ???????? ??????. ????????? ?? ??????? ????? ??????? ????? ????????? ????? ?? ???????.  ?????????? ?? ????? ?????? ? ????????? ?????? ?? ???????.  ???????, ????? ???????? ??????? ??????????? ????? ??????????? ??????????.  ?????????? ?????, ?????, ???? ? ????????, ??????? ???????? ? ??? ??????? ?????? (????????????? ???????). ????????? ??. ????? ?????? ???? ? ???? ???????????, ????? ??????? ???????  ????????? ??? ???????? ? ???? ????, ?? ?????? ? ? ??????????.  ????????? ??????????? ?????????????? ???? ????????? ? ?????????.  ????????? ??????. ??????????? ?????????? ??????????.  ????????? ?????? ? ????, ? ??? ????? ??????? ????? ? ?????. ????? ????????? ????? ???????????? ??? ?????? ?? ???????? ?????????? ?? ????? ?????? ? ????????? ????????? ?????????, ???????? ????? ???????? ? ????????? ? ?????? ??????????????. ?????????? ?? ???? ???? ????????? ???? ??? ?????? ?????? ?? ?????, ??? ?????????? ???? ????????? ??-???????????.  ???? ?? ????????? ??? ??????? ??????, ?????????? ? ????? ??????? ?????? ? ?????? ????????????  ??? ???? ????????? ????????? ? ?????? ?? ???????. ?? ?????????? ?????????, ?????????? ??????? ??????, ???? ?????? ?? ??? ?? ?????????? ??? ??????? ????. ??? ?????? ?? ???????: ???????? ??? ???????????  ????????? ?????????? ?? ???????, ? ?? ?????????? ?????????? ?????????? ?????????? ??????? ?? ???????????? ?????? ???????. ???????????? ??????????, ??? ?????? ????? ?? ??????? ???????????, ??? ???????????.  ????????? ????? ????????????, ?????????? ??? ?????????? ?????? ??????? ???????. ? ??? ????? ?????? ?????? ?? ????? ??????? ??????, ???? ?? ?????? ????????? ???????? ????????????. ???? ???????? 10-???????? ???????????? ????? ???? ????????????. ?????????? ????????? ?? ?????? ????????? ?????????, ?????????? ??????? ??????. ??? ????????? ???????? ????? ??????, ? ????????? ????? ?????????? ??? ???????. ????????????? ????????? ????? ????????? ? ??????? ??????? ??? ?????? ???????? ? ?????????. ????????????? ????????? ?????:  ????????? ??????? ??????????.  ????????? ???????? ??????. ??? ??????? ???? ????? ???????????????:  ??????????? ????????, ??????????? ??????? ??? ???????.  ??????????? ?????????? ??? ?????.  ?????????????? ?????????, ??????????? ??????. ??????? ??????? ??????? ??????? ? ??????? ?????????, ????????? ?????? ??? ??????? ?????? ? ?????????? ??? ??????? ????? ????, ??? ?? ??????? ??????. ???????? ??????? ???????? ??????? ??? ?? ?????? ?????????????. ? ??? ?????????:  ?????? ???? ? ?????????????.  ?????????? ????? ??? ????????? ??????.  ???????? ????????? ??? ??????????.  ?????? ????????? ??? ????????? ????????????.  ????????? ???????? ????????? ?????????.  ????????? ??? ?????????? ??? ????????? ?????????. ??????????? ??????????, ??????? ????? ?????? ??? ?????????????? ?????? ????? ?????? ?? ???????, ???????????? ???????????, ?????? ? ????????? ?????????. ?????????? ????????? ?????????? ?????????? ??? ????????? ?????????, ? ????? ???-?????. ????????? ???????? Quit Guide ?? CDC  ? [email or web address] ??? ????? ???????, ????? ????? ??????? ???????   ??????????? ? ????? ? ??????? ?? ??????? ? ????????? ??????????. ????????? ?? ???????? ????????? ?????? ???????(1-800-QUIT-NOW), ?????????? ? ??????????????? ?????? ????????? ??? ? ???????????? ?? ???????.  ??????? ??????????? ?? ?????? ????? ???.  ????????? ????????? ????, ?????????? ? ??? ??????? ??????, ????? ??? ????, ????????? ??? ??????? ?? ??????.  ????????? ????? ????? ????????? ?????.  ??????? ??????? ??????? ? ????? ?????. ? ????????? ????? ?????? ????? ???????? ???? ? ???????. ????? ????????? ??????, ??????????: ? ?????????? ?????????? ?????????? ??????????. ? ?????????? ??????????? ??????????. ? ??????? ?????. ? ?????????????. ? ?????????? ?????????? ???????????? ?????? ????. ??? ????????, ?????? ?????, ?????? ????? ????? ???? ? ?????? ?? ???, ???????, ????? ????? ???? ???????? ?????????. ???????????? ?????????? ????? ? ???? ????????. ??? ? ???? ???? ???????????, ????? ????? ??????? ?  1??? ?? 3?????? ? ??????? ?????? 24 ????? ????? ?????? ?? ??????? ? ??? ????? ????????? ???????? ???????????. ??? ????????, ??? ???????, ???????? ???????? ????? 2-3 ??? ????? ????, ??? ?? ??????? ??????, ?? ??? ?????? ?? ?????? ????? 2-3 ??????. ? ??? ????? ?????????? ????? ????????:  ???????? ??????????.  ???????????? ?????????, ??????????? ??? ?????????????????.  ??????????? ???????????? ????????.  ??????????????.  ??????? ???? ? ????????? ? ????????.  ????????? ?????????? ????? ????.  ?????.  ???????.  ?????? ??? ???? ? ?????.  ????????? ? ???????? ????????????? ??????????, ??????? ?? ?????????? ?? ?????? ????????, ?? ??? ????????.  ?????????.  ???????? ?? ???? (??????????). ?????? 3 ? ????? ?? ?????????? ?????? 2-3 ?????? ?????? ?? ??????? ?? ??????? ???????? ????? ?????????? ??????????, ????? ???:  ????????? ???????? ? ???????? ????????.  ?????????? ????? ? ???? ? ?????.  ?????????? ??????????  ?????.  ???????? ????????????? ????????.  ???????? ????.  ??????????? ?????? ?????.  ?????????? ?????????? ????, ????? ?? ??????. ????? ?? ??????? ????? ???? ????? ??????? ???????. ?? ??????? ??????????????, ???? ??? ??? ?? ??????? ? ??????? ????. ????????? ????? ????? ??????? ????? ??????? ?????? ?? ???????, ?????? ??? ??? ????????? ?????? ? ???????????? ???????????. ??????? ??? ????????? ??? ?????????? ?????? ????? ??????? ??????, ? ?????????? ? ????? ??????, ???? ? ??? ??????? ?????-???? ??????? ??? ????????? ??? ????????????. ??????  ??????? ?????? ???????? ???????? ???????? ??????, ??????? ????? ?????????????. ??????? ?????? - ??? ?????????, ??? ?? ?????? ??????? ??? ?????? ????????.  ????? ?? ?????? ?????????? ?? ???????, ???????? ????, ??????? ???????? ??????? ??????.  ???????? ?????? ?????; ? ?? ??????????, ?????????? ?? ???????.  ????? ?? ?????? ????? ?? ???????, ?????????? ?? ??????? ? ?????? ???????? ?????, ????? ??? ???????. ??? ?????????? ?? ????? ???????? ??????, ??????????????? ????? ??????. ??????????? ???????? ????? ???????????? ??? ??????? ? ????? ??????? ??????. Document Released: 12/11/2006 Document Revised: 12/15/2018 Document Reviewed: 12/15/2018 Elsevier Patient Education  2020 [employer].     ??????????? ?????????????? ??????? Peripheral Vascular Disease ??????????? ?????????????? ??????? (???)- ??? ??????????? ??????????? ???????. ????? ??????? ???????? ???- ?????? ??????????????. ? ??????????? ??????? ??? ???????? ? ??????? ??????????? ???????, ?? ??????? ????? ??????????? ?? ?????? ? ?????? ?????? ?????????. ??? ????? ???????? ? ?????????????? ?????????????? ???, ??? ? ?????????? ???????, ????????, ??????? ??? ?????. ??? ?? ?????, ???? ????? ????? ??????????? ???????? ?????? ??????? ??? ? ?????. ?????????? ??? ???? ???:  ???????????? ???. ??? ???????? ???????????????? ???. ????? ??????????? ????????? ? ?????????? ??????????? ????????? ???????????  ???????.  ?????????????? ???. ????? ??????????? ????????? ? ?????????? ?????????, ??????? ???????? ? ?????? ? ??????? ??????????? ??????? (??????). ??? ??????? ??? ?????? ???????????? ? ???????? ???????. ??? ????? ????? ????????? ? ?????? ?????? ???????????. ??? ?????????, ??????? ??????????????? ????????? ????????? ?????????????? ? ???? ??? ????. ??? ?????????? ?????????. ?????? ????????  ??? ??????? ???? ????? ????????? ?? ???? ????????? ??????. ???????? ???????????????? ???????? ????????????? ??? ???????? ????????? ??????? ???????? (??????) ?????? ??????? ????????, ?.?. ????????????. ????????? ????? ?????? ????? ?????????? ?? ?????? ??????????? ??????? ? ???????? ? ??????? ??????? ????????. ??? ???????? ? ???????????? ????????? ? ????? ???????? ?????? ?????? ???????????. ??????? ????????????????? ????????? ??? ????????:  ???????? ???????, ???????????? ?????? ??????????? ???????.  ??????????? ??????????? ???????.  ???????????, ??????? ????? ????????? ? ?????????? ??? ??????? ??????????? ???????.  ????? ????? ? ???????, ????????????? ??????????? ????????????? ???. ??? ???????? ????? ??????????? ????????? ? ??? ????? ????????? ????? ???? ????, ????:  ? ??? ??????????? ???????? ??????? ?? ???.  ? ??? ??????? ???????????? ??????????? ?????????, ? ??? ?????: ? ??????? ??????? ??????????? ? ?????. ? ???????? ??????. ? ?????????? ???????????? ???????? (???????????). ? ??????????? ??????? ??????. ? ?????? ??????????? ???????, ????????? ? ????????? ?????????, ? ????????. ? ?????? ?????, ????????, ?????? ??? ?????????, ? ????????. ????? ?????? ????? ????????? ? ??????????? ??????????? ??????? ? ???????????. ? ??????? ???????. ??? ??????? ????????? ??-?? ?????????? ??????????? ??????? ? ?????? ? ???????. ? ????????? ????? ???????. ? ?????? ?????????? ??????, ??????? ???????? ??????? ? ?????. ? ??????????? ?????.  ?? ???????????? ????? ??? ??????.  ? ??? ???????????? ?????????? ????????.   ? ??? ????????.  ???  50??? ??? ??????. ?????? ???????? ??? ????????? ??? ????????? ????? ????????? ? ????????????? ????????? ?????????. ???? ???????? ??????? ?? ????, ????? ????? ????????? ?? ???????? ???????????? ?????????? ?????. ???????????????? ???????? ? ???????? ???????? ?????????:  ?????? ? ?????? ???????? ???. ??? ????? ???? ????????? ?????????????? ?????????????? ? ????? (????????????? ????????? ??????????????).  ???? ? ???????? ? ?????. ??? ???????? ????????? ?? ????? ?????????? ?????????? ? ???????? ?? ????? ?????? (?????????????? ???????????? ????????? ??????????????).  ???? ? ????? ?? ????? ??????.  ????????, ??????????? ??? ???????? ? ?????.  ??????????? ???? ??? ??????, ? ??????????? ?? ????????? ? ?????? ?????.  ?????????, ?????????? ???? ??? ?????. ??? ????? ????: ? ????????? ?????. ? ?????? ????. ? ????????? ??? ??????????? ??????? ????. ? ?????????? ????? ?? ?????.  ????????????? ? ??????? ??? ?? ??????????? (??????????? ??????????).  ?????????? ????????????. ? ????? ? ??? ???? ?????? ??????????? ???? ? ?????? ?? ????????, ?????? ??? ?????. ?????????? ????? ??? ? ??????? ????? ??????? ?????? ????????. ??? ??? ?????????????? ??? ????????? ??????????????? ?? ?????????:  ????????????? ? ??? ????????? ? ?????????.  ??????????? ???????????? ???????????? ? ?????? ?????? ???????????? ????????.  ??????????? ?????? ????????????, ??????????? ????????, ??? ??????? ? ????????????? ? ??? ???, ? ????? ?????????? ??????? ??????? ???????????. ???????????? ????? ????????: ? ????????? ????????????? ???????? ? ????? ????? ? ?????, ? ????? ????????????? ?????? ?????? (?????????? ???????????????). ? ???????????????? ???????????? ? ?????????????? ???????? ???? ??? ????????? ??????????? ??????????? ????? ????? ???? ??????????? ?????? (??????????????????). ? ???????? ? ???? ??????????? ?????? ????????????? ???????? ????? ??????????? ???????????????? ???????????? ? ??????????????:   ????????????? ????? (??????????? ??? ?????????????).  ???????????? ??????????? ????????????? ????? (??-???????????).  ?????????? ? ??????? ????????????????? ???? (????????-??????????? ??????????? ??? ???). ??? ??? ???????? ??????? ??? ??????? ?? ??????? ?????? ????????? ? ??????????? ????????? ? ??? ?????????. ????? ??? ??????? ?? ?????? ????????. ?????????, ??????????????? ????????????? ????? ???????????, ?????????? ?????? ? ??????????????. ? ??? ????? ?????????? ???????????? (???????????) ?????????, ????? ??? ???????? ??????, ?????????? ??????? ??????????? ? ?????????? ???????????? ????????. ??????? ????????:  ????????? ?????? ?????, ????????: ? ????? ?? ???????. ? ?????????? ?????????? ?????????? ??????????. ? ?????????? ????? ? ?????? ??????????? ????? ? ???????????.  ????? ????????????? ??????????, ????? ???: ? ????????? ??? ?????????? ?????, ??????????????? ?????????????????. ? ????????? ??? ????????? ?????????. ? ????????? ??? ??????????????? ?????? ??????????? ? ?????.  ????????????? ?????????, ????? ???: ? ?????????, ?????????????? ????????????? ?????????? ???????? ??? ?????????? ????????? ??????? ? ????????? ????????? (?????????????). ? ?????????, ?????????????? ????????? ?????????????? ????????? ??????????????? ??????? (??????) ??? ??????????? ???????????? ??????? ? ???????? ?????????. ? ????????????? ???????? ??? ??????????????? ????????? ? ????? ??????????????? ??????? (???????????? ?????????????? ???????). ? ????????????? ???????? ??? ???????? ???????? ????? ?? ?????????????? ???? ?? ?????????? ??????????. ? ?????????. ??? ????????????? ???????? ?????????? ??????????. ????? ????????????? ????? ????????????? ? ??? ??????, ???? ?????? ?????? ?????????? ?? ??????? ????????? ??? ?????? ??????????????? ??? ????????????? ??????? ???????. ? ???????? ???????? ???????? ????? ???????????: ????? ?????  ?? ???????????? ????????, ?????????? ???????, ??? ???????? ???????, ????? ???  ???????? ? ??????????? ????????. ???? ??? ?????? ?? ??????? ??? ????? ??????, ?????????? ? ?????? ???????? ?????.  ? ?????? ??????? ???? ???????? ??? ? ??????????? ???????? ???, ?????????????? ??????? ??? ?? ????? ??????? ??????.  ??????????????? ????? ? ?????? ??????????? ????? ? ???????????. ???? ??? ????? ??????, ?????????? ? ?????? ???????? ?????.  ????????? ????????????. ???????? ? ?????? ???????? ?????, ????? ??? ??????? ??? ????????. ????? ????????  ?????????? ?????????????? ? ??????????? ????????????? ????????? ?????? ? ???????????? ? ?????????? ?????? ???????? ?????.  ?????????? ?????????? ??????? ? ????? ???????. ? ?????? ?????????? ????? ??????????? ???????. ? ????????? ?????????? ???? ?????? ?? ??????? ??????? ??? ???????.  ????????? ?? ??? ??????????? ??????, ??? ??????? ????? ??????? ??????. ??? ?????. ?????????? ? ???????? ?????, ????:  ? ??? ????????? ?????? ? ????? ?? ????? ??????.  ? ??? ????????? ???? ? ????? ?? ????? ??????.  ? ??? ??????????? ??????????? ???? ??? ??????.  ? ??? ??????????? ????????? ????.  ? ??? ??????????? ??????????.  ? ??? ?? ??????? ??????? ???????????? ?????? ??? ??????. ?????????? ?????????? ?? ???????, ????:  ???? ???? ??? ???? ?????????? ????????, ?????? ? ??????????? ????????? ???????.  ???? ???? ??? ???? ????????, ???????, ???????, ?????????? ??????????? ??? ??????.  ? ??? ????????? ???? ? ????? ??? ???????????? ???????.  ? ??? ????????? ????????? ???????? ???????? ? ????, ???? ??? ????.  ?? ???????? ?????????? ??????????? ???????? ??? ??????? ??????????? ????????.  ? ??? ???????? ????????? ????? ??????? ???????? ???? ??? ????????? ??????. ??????  ??????????? ?????????????? ??????? (???)- ??? ??????????? ??????????? ???????.  ? ??????????? ??????? ??? ???????? ? ??????? ??????????? ???????, ?? ??????? ????? ??????????? ?? ?????? ?????? ? ?????? ?????? ?????????.  ??? ????? ????????? ? ????????????? ????????? ?????????.  ???? ???????? ??????? ?? ????, ????? ????? ????????? ?? ???????? ???????????? ?????????? ?????.  ??????? ??? ??????? ?? ??????? ????????? ? ??????????? ????????? ?????????. ??? ?????????? ?? ????? ???????? ??????, ??????????????? ????? ??????. ??????????? ???????? ????? ???????????? ??? ??????? ? ????? ??????? ??????.   Document Released: 01/12/2009 Document Revised: 03/03/2017 Document Reviewed: 03/03/2017 Elsevier Patient Education  2020 [employer].

## 2019-05-27 NOTE — Progress Notes (Signed)
[facility] [place]   CC: Follow up peripheral artery occlusive disease  History of Present Illness Stanley Chaney is a 58 y.o. male who is s/p stent placement of left SFA with 6 x 68m elluvia on 12-30-18 by Dr. CDonzetta Mattersfor critical left lower extremity ischemia.  He is also s/patherectomyofright SFA and drug-coated balloon angioplasty of right SFAon 07-20-18 by Dr. CDonzetta Mattersfor critical right lower extremityischemia. Pt hadundergoneadrug-coated balloon angioplasty of his left SFAon 06-15-18 by Dr. CJason Filaa wound on his foot. He also hada dry ulcer on his right foot.  Pt returns today for follow up surveillance.  His native language [place] he is able to understand me and converse with me.  He states that his left foot ulcers have healed since the above procedure, and he has no difficulty in his legs with walking. He states that he walks a couple of miles daily He has no signs of ischemia in either lower extremity.   He walks a dog for a total to 3-4 hours daily with no claudication symptoms.   He denies any known history of stroke or TIA.  [facility]   He saw his PCP was in March 2020 for chest pain. He is followed by ortho for bilateral shoulder pain. In March of 2020 his troponin was 0, ECG showed NSR with 2 paired ventricular complexes.   Diabetic:Yes, 10.4 A1C on 05-19-19, uncontrolled; normal GFR and serum creatinine as of 11-12-18 Tobacco use: smoker, started at age 58 stopped a few times, currently 5 cigs/day  Pt meds include: Statin :Yes Betablocker:No ASA:Yes Other anticoagulants/antiplatelets:he admits that he never took the prescribed Plavix   Past Medical History:  Diagnosis Date  . [facility]   . PAD (peripheral artery disease) ([facility])     Social History Social History   Tobacco Use  . Smoking status: Former Smoker    Quit date: 06/14/2018    Years  since quitting: 0.9  . Smokeless tobacco: Never Used  . Tobacco comment: pt reports quitting 6 months ago  Substance Use Topics  . Alcohol use: Yes    Alcohol/week: 18.0 standard drinks    Types: 18 Cans of beer per week    Comment: pt reports not drinking for past 6 months  . Drug use: Never    Family History Family History  Problem Relation Age of Onset  . Diabetes Mother   . Cancer Mother     Past Surgical History:  Procedure Laterality Date  . ABDOMINAL AORTOGRAM W/LOWER EXTREMITY Bilateral 06/15/2018   Procedure: ABDOMINAL AORTOGRAM W/LOWER EXTREMITY;  Surgeon: CWaynetta Sandy MD;  [facility];  Service: Cardiovascular;  Laterality: Bilateral;  . ABDOMINAL AORTOGRAM W/LOWER EXTREMITY Left 12/30/2018   Procedure: ABDOMINAL AORTOGRAM W/LOWER EXTREMITY;  Surgeon: CWaynetta Sandy MD;  [facility];  Service: Cardiovascular;  Laterality: Left;  . I&D EXTREMITY Left 06/12/2018   Procedure: Excisional Debridement left foot;  Surgeon: GDorna Leitz MD;  [facility];  Service: Orthopedics;  Laterality: Left;  . LOWER EXTREMITY ANGIOGRAM Right 07/20/2018     Right lower extremity angiogram  . LOWER EXTREMITY ANGIOGRAPHY Right 07/20/2018   Procedure: Lower Extremity Angiography;  Surgeon: CWaynetta Sandy MD;  [facility];  Service: Cardiovascular;  Laterality: Right;  . PERIPHERAL VASCULAR ATHERECTOMY Left 06/15/2018   Procedure: PERIPHERAL VASCULAR ATHERECTOMY;  Surgeon: CWaynetta Sandy MD;  [facility];  Service: Cardiovascular;  Laterality: Left;  SFA  . PERIPHERAL VASCULAR BALLOON ANGIOPLASTY Left 06/15/2018   Procedure: PERIPHERAL VASCULAR BALLOON ANGIOPLASTY;  Surgeon: [name], MD;  Location: [place] [facility];  Service: Cardiovascular;  Laterality: Left;  SFA  . PERIPHERAL VASCULAR BALLOON ANGIOPLASTY Right 07/20/2018   Procedure: PERIPHERAL VASCULAR  BALLOON ANGIOPLASTY;  Surgeon: [name], MD;  Location: [place] [facility];  Service: Cardiovascular;  Laterality: Right;  SFA WITH DRUG COATED   . PERIPHERAL VASCULAR INTERVENTION Left 12/30/2018   Procedure: PERIPHERAL VASCULAR INTERVENTION;  Surgeon: [name], MD;  Location: [place] [facility];  Service: Cardiovascular;  Laterality: Left;  SFA    No Known Allergies  Current Outpatient Medications  Medication Sig Dispense Refill  . aspirin 81 MG EC tablet Take 1 tablet (81 mg total) by mouth daily. 90 tablet 1  . atorvastatin (LIPITOR) 10 MG tablet Take 1 tablet (10 mg total) by mouth daily. 90 tablet 0  . blood glucose meter kit and supplies Dispense based on patient and insurance preference. Use up to four times daily as directed. (FOR ICD-10 E10.9, E11.9). 1 each 0  . empagliflozin (JARDIANCE) 10 MG TABS tablet Take 10 mg by mouth daily. 30 tablet 2  . Lancets MISC Inject 1 each into the skin 3 (three) times daily as needed. 100 each 3  . metFORMIN (GLUCOPHAGE) 500 MG tablet Take 2 tablets (1,000 mg total) by mouth 2 (two) times daily with a meal. intial week - 2 tabs in am, 1 tab in pm. Increase to 2 tabs BID week 2 if tolerated. 360 tablet 1   No current facility-administered medications for this visit.     ROS: See HPI for pertinent positives and negatives.   Physical Examination  Vitals:   05/27/19 1402  BP: (!) 145/74  Pulse: 62  Resp: 14  Temp: 97.8 F (36.6 C)  TempSrc: Temporal  SpO2: 98%  Weight: 147 lb 11.2 oz (67 kg)  Height: _0  (1.702 m)   Body mass index is 23.13 kg/m.  General: A&O x 3, WN, slender male. Gait: normal HEENT: No gross abnormalities.  Pulmonary: Respirations are non labored, CTAB, good air movement in all fields Cardiac: regular rhythm, no detected murmur.        Carotid Bruits Right Left   Negative Negative   Radial pulses are 2+ palpable bilaterally   Adominal aortic pulse is mildly palpable                          VASCULAR EXAM: Extremities without ischemic changes, without Gangrene; without open wounds.                                                                                                          LE Pulses Right Left       FEMORAL  2+ palpable  2+ palpable        POPLITEAL  2+ palpable   1+ palpable  POSTERIOR TIBIAL  2+ palpable   2+ palpable        DORSALIS PEDIS      ANTERIOR TIBIAL 1+ palpable  faintly palpable    Abdomen: soft, NT, no palpable masses. Skin: no rashes, no cellulitis, no ulcers noted. Musculoskeletal: no muscle wasting or atrophy.  Neurologic: A&O X 3; appropriate affect, Sensation is normal; MOTOR FUNCTION:  moving all extremities equally, motor strength 5/5 throughout. Speech is fluent/normal. CN 2-12 intact. Psychiatric: Thought content is normal, mood appropriate for clinical situation.    DATA  Left LE Arterial Duplex (05-27-19): +-----------+--------+-----+---------------+---------+--------+ LEFT       PSV cm/sRatioStenosis       Waveform Comments +-----------+--------+-----+---------------+---------+--------+ CFA Distal 91                          triphasic         +-----------+--------+-----+---------------+---------+--------+ DFA        156                         biphasic          +-----------+--------+-----+---------------+---------+--------+ SFA Prox   366          50-74% stenosisbiphasic plaque   +-----------+--------+-----+---------------+---------+--------+ SFA Mid    145                         biphasic          +-----------+--------+-----+---------------+---------+--------+ SFA Distal 87                          biphasic          +-----------+--------+-----+---------------+---------+--------+ POP Prox   49                          biphasic          +-----------+--------+-----+---------------+---------+--------+ POP Mid    62                          biphasic           +-----------+--------+-----+---------------+---------+--------+ POP Distal 58                          biphasic          +-----------+--------+-----+---------------+---------+--------+ TP Trunk   63                          biphasic          +-----------+--------+-----+---------------+---------+--------+ ATA Distal 26                          biphasic          +-----------+--------+-----+---------------+---------+--------+ PTA Distal 50                          biphasic          +-----------+--------+-----+---------------+---------+--------+ PERO Distal22                          biphasic          +-----------+--------+-----+---------------+---------+--------+  A focal velocity elevation of 366 cm/s was obtained at proximal superficial femoral artery with post stenotic turbulence  with a VR of 2.7.   Left Stent(s): +---------------+--------+--------+--------+--------+ mid-distal SFA PSV cm/sStenosisWaveformComments +---------------+--------+--------+--------+--------+ Prox to Stent  104             biphasic         +---------------+--------+--------+--------+--------+ Proximal Stent 88              biphasic         +---------------+--------+--------+--------+--------+ Mid Stent      60              biphasic         +---------------+--------+--------+--------+--------+ Distal Stent   40              biphasic         +---------------+--------+--------+--------+--------+ Distal to Stent99              biphasic         +---------------+--------+--------+--------+--------+  Summary: Left: 50-74% stenosis noted in the proximal superficial femoral artery. Patent stent with no evidence of stenosis in the mid-distal superficial femoral artery.   Left LE Arterial Duplex (02-24-19): LEFT PSV cm/sRatioStenosisWaveformComments +----------+--------+-----+--------+--------+--------+ CFA Mid 122    biphasic  +----------+--------+-----+--------+--------+--------+ CFA Distal100   biphasic  +----------+--------+-----+--------+--------+--------+ DFA 71   biphasic  +----------+--------+-----+--------+--------+--------+ SFA Prox 171   biphasic  +----------+--------+-----+--------+--------+--------+ SFA Mid 117   biphasic  +----------+--------+-----+--------+--------+--------+ SFA Distal70      +----------+--------+-----+--------+--------+--------+ POP Prox 53      +----------+--------+-----+--------+--------+--------+ POP Distal52   biphasic  +----------+--------+-----+--------+--------+--------+ ATA Distal33   biphasic  +----------+--------+-----+--------+--------+--------+ PTA Distal47   biphasic  +----------+--------+-----+--------+--------+--------+  Left Stent(s): +-----------------+--------+--------+--------+--------+ Mid to distal SFAPSV cm/sStenosisWaveformComments +-----------------+--------+--------+--------+--------+ Prox to Stent 124  biphasic  +-----------------+--------+--------+--------+--------+ Proximal Stent 87  biphasic  +-----------------+--------+--------+--------+--------+ Mid Stent 74  biphasic  +-----------------+--------+--------+--------+--------+ Distal Stent 54  biphasic  +-----------------+--------+--------+--------+--------+ Distal to Stent 82  biphasic  +-----------------+--------+--------+--------+--------+ Summary: Left: Near normal examination. Patent stent with no evidence of stenosis in the  Mid to distal SFA artery. Significant improvement compared to the exam on 12-04-18 at which time there was a PSV at the distal SFA of 623 PSV with monophasic waveforms distal to this.    ABI (Date: 02/24/2019): ABI Findings: +---------+------------------+-----+---------+--------+ Right Rt Pressure (mmHg)IndexWaveform Comment  +---------+------------------+-----+---------+--------+ Brachial 148     +---------+------------------+-----+---------+--------+ ATA 151 1.01 triphasic  +---------+------------------+-----+---------+--------+ PTA 123 0.82 triphasic  +---------+------------------+-----+---------+--------+ Great Toe76 0.51    +---------+------------------+-----+---------+--------+  +---------+------------------+-----+----------+-------+ Left Lt Pressure (mmHg)IndexWaveform Comment +---------+------------------+-----+----------+-------+ Brachial 150     +---------+------------------+-----+----------+-------+ ATA 104 0.69 monophasic  +---------+------------------+-----+----------+-------+ PTA 136 0.91 triphasic   +---------+------------------+-----+----------+-------+ Great Toe63 0.42    +---------+------------------+-----+----------+-------+  +-------+-----------+-----------+------------+------------+ ABI/TBIToday's ABIToday's TBIPrevious ABIPrevious TBI +-------+-----------+-----------+------------+------------+ Right 1.01 0.82 1.12 0.58  +-------+-----------+-----------+------------+------------+ Left 0.91 0.42 0.88 0.50  +-------+-----------+-----------+------------+------------+  Bilateral ABIs appear  essentially unchanged compared to prior study on 12/04/2018.  Summary: Right: Resting right ankle-brachial index is within normal range. No evidence of significant right lower extremity arterial disease. The right toe-brachial index is abnormal. RT great toe pressure = 76 mmHg.  Left: Resting left ankle-brachial index indicates mild left lower extremity arterial disease. The left toe-brachial index is abnormal. LT Great toe pressure = 63 mmHg.   ABI Findings (05-27-19): +---------+------------------+-----+--------+--------+ Right    Rt Pressure (mmHg)IndexWaveformComment  +---------+------------------+-----+--------+--------+ Brachial 132                                     +---------+------------------+-----+--------+--------+ PTA      123  0.93 biphasic         +---------+------------------+-----+--------+--------+ DP       138               1.05 biphasic         +---------+------------------+-----+--------+--------+ Great Toe81                0.61 Abnormal         +---------+------------------+-----+--------+--------+  +---------+------------------+-----+----------+-------+ Left     Lt Pressure (mmHg)IndexWaveform  Comment +---------+------------------+-----+----------+-------+ Brachial 132                                      +---------+------------------+-----+----------+-------+ PTA      115               0.87 biphasic          +---------+------------------+-----+----------+-------+ DP       105               0.80 monophasic        +---------+------------------+-----+----------+-------+ Great Toe67                0.51 Abnormal          +---------+------------------+-----+----------+-------+  +-------+-----------+-----------+------------+------------+ ABI/TBIToday's ABIToday's TBIPrevious ABIPrevious TBI +-------+-----------+-----------+------------+------------+ Right  1.05       0.61        1.01        0.51         +-------+-----------+-----------+------------+------------+ Left   0.87       0.51       0.91        0.42         +-------+-----------+-----------+------------+------------+ Bilateral ABIs and TBIs appear essentially unchanged compared to prior study on 02/24/2019.   Summary: Right: Resting right ankle-brachial index is within normal range. No evidence of significant right lower extremity arterial disease. The right toe-brachial index is abnormal. RT great toe pressure = 81 mmHg. PPG tracings appear dampened.  Left: Resting left ankle-brachial index indicates mild left lower extremity arterial disease. The left toe-brachial index is abnormal. LT Great toe pressure = 67 mmHg. PPG tracings appear dampened.   ASSESSMENT: Stanley Chaney is a 58 y.o. male who is s/p stent placement of left SFA with 6 x 88m elluvia on 12-30-18 by Dr. CDonzetta Mattersfor critical left lower extremity ischemia.  He is also s/patherectomyofright SFA and drug-coated balloon angioplasty of right SFAon 07-20-18 by Dr. CDonzetta Mattersfor critical right lower extremityischemia. Pt hadundergoneadrug-coated balloon angioplasty of his left SFAon 06-15-18 by Dr. CJason Filaa wound on his foot.  The ulcer at his left medal malleolus has healed. He walks any distance with no claudication. He walks a couple of miles daily.   Left LE arterial duplex today with increased stenosis at proximal SFA (366 cm/s PSV), was no stenosis in June 2020 duplex. However, pt admitted today that he never took the prescribed Plavix, he continues to smoke, and his DM is uncontrolled.  Right ABI remains normal with biphasic waveforms. Left ABI has declined slightly from 0.91 to 0.87, bi and monophasic waveforms.   I advised him to see his PCP ASAP to help him manage his uncontrolled DM. He resumed smoking. Over 3 minutes was spent counseling patient re smoking cessation, and patient was given free resources re smoking  cessation, in [place].   He admits today that he never took the Plavix on his medication list. See Plan.  His atherosclerotic risk factors include uncontrolled DM and resumption of smoking and not taking the prescribed Plavix.   PLAN:  Based on the patient's vascular studies and examination, and after discussing with Dr. Oneida Alar, pt will return to clinic in 3 months with left LE Arterial duplex and ABI's, see Dr. Donzetta Matters. Walk at least 30 minutes total daily in a safe environment.   Plavix refills sent to his pharmacy. I advised pt to take one tablet daily, and discussed the improtance of taking the Plavix.     I discussed in depth with the patient the nature of atherosclerosis, and emphasized the importance of maximal medical management including strict control of blood pressure, blood glucose, and lipid levels, obtaining regular exercise, and cessation of smoking.  The patient is aware that without maximal medical management the underlying atherosclerotic disease process will progress, limiting the benefit of any interventions.  The patient was given information about PAD including signs, symptoms, treatment, what symptoms should prompt the patient to seek immediate medical care, and risk reduction measures to take.  Clemon Chambers, RN, MSN, FNP-C [facility] Phone: [phone]  Clinic MD: Laqueta Due  05/27/19 2:39 PM

## 2019-08-12 ENCOUNTER — Other Ambulatory Visit: Payer: Self-pay

## 2019-08-12 DIAGNOSIS — I779 Disorder of arteries and arterioles, unspecified: Secondary | ICD-10-CM

## 2019-08-13 ENCOUNTER — Other Ambulatory Visit: Payer: Self-pay

## 2019-08-13 ENCOUNTER — Ambulatory Visit (INDEPENDENT_AMBULATORY_CARE_PROVIDER_SITE_OTHER)
Admission: RE | Admit: 2019-08-13 | Discharge: 2019-08-13 | Disposition: A | Discharge status: Home / Self Care | Payer: BC Managed Care – PPO | Source: Ambulatory Visit | Attending: Vascular Surgery | Admitting: Vascular Surgery

## 2019-08-13 ENCOUNTER — Ambulatory Visit (HOSPITAL_COMMUNITY)
Admission: RE | Admit: 2019-08-13 | Discharge: 2019-08-13 | Disposition: A | Discharge status: Home / Self Care | Payer: BC Managed Care – PPO | Source: Ambulatory Visit | Attending: Vascular Surgery | Admitting: Vascular Surgery

## 2019-08-13 ENCOUNTER — Encounter: Payer: Self-pay | Admitting: Vascular Surgery

## 2019-08-13 ENCOUNTER — Ambulatory Visit (INDEPENDENT_AMBULATORY_CARE_PROVIDER_SITE_OTHER): Payer: BC Managed Care – PPO | Admitting: Vascular Surgery

## 2019-08-13 VITALS — BP 166/74 | HR 58 | Temp 97.9°F | Resp 20 | Ht 67.0 in | Wt 145.0 lb

## 2019-08-13 DIAGNOSIS — I779 Disorder of arteries and arterioles, unspecified: Secondary | ICD-10-CM | POA: Insufficient documentation

## 2019-08-13 NOTE — Progress Notes (Signed)
Patient ID: Stanley Chaney, male   DOB: [birth year], 58 y.o.   MRN: [id number]  Reason for Consult: Follow-up   Referred by Wendie Agreste, MD  Subjective:     HPI:  Stanley Chaney is a 58 y.o. male history of uncontrolled diabetes previously had a wounds of his bilateral feet and initially underwent treatment in late 2019 with drug-coated balloon angioplasty bilaterally.  More recently a drug-eluting stent in the left for recurrent wound.  All wounds are now healed.  States that he is now able to walk without limitation.  He had quit smoking unfortunately has resumed.  Had not been taking Plavix but states that he has started.  No other issues related to today's visit.  Past Medical History:  Diagnosis Date  . Diabetes mellitus without complication ([address])   . PAD (peripheral artery disease) (H[facility])    Family History  Problem Relation Age of Onset  . Diabetes Mother   . Cancer Mother    Past Surgical History:  Procedure Laterality Date  . ABDOMINAL AORTOGRAM W/LOWER EXTREMITY Bilateral 06/15/2018   Procedure: ABDOMINAL AORTOGRAM W/LOWER EXTREMITY;  Surgeon: [name], MD;  Location: [facility];  Service: Cardiovascular;  Laterality: Bilateral;  . ABDOMINAL AORTOGRAM W/LOWER EXTREMITY Left 12/30/2018   Procedure: ABDOMINAL AORTOGRAM W/LOWER EXTREMITY;  Surgeon: [name], MD;  Location: [facility];  Service: Cardiovascular;  Laterality: Left;  . I&D EXTREMITY Left 06/12/2018   Procedure: Excisional Debridement left foot;  Surgeon: [name], MD;  [facility];  Service: Orthopedics;  Laterality: Left;  . LOWER EXTREMITY ANGIOGRAM Right 07/20/2018     Right lower extremity angiogram  . LOWER EXTREMITY ANGIOGRAPHY Right 07/20/2018   Procedure: Lower Extremity Angiography;  Surgeon: [name], MD;  Location: [facility];  Service: Cardiovascular;  Laterality: Right;  . PERIPHERAL VASCULAR ATHERECTOMY Left  06/15/2018   Procedure: PERIPHERAL VASCULAR ATHERECTOMY;  Surgeon: [name], MD;  Location: [facility];  Service: Cardiovascular;  Laterality: Left;  SFA  . PERIPHERAL VASCULAR BALLOON ANGIOPLASTY Left 06/15/2018   Procedure: PERIPHERAL VASCULAR BALLOON ANGIOPLASTY;  Surgeon: [name], MD;  Location: [place] [facility];  Service: Cardiovascular;  Laterality: Left;  SFA  . PERIPHERAL VASCULAR BALLOON ANGIOPLASTY Right 07/20/2018   Procedure: PERIPHERAL VASCULAR BALLOON ANGIOPLASTY;  Surgeon: [name], MD;  Location: [facility];  Service: Cardiovascular;  Laterality: Right;  SFA WITH DRUG COATED   . PERIPHERAL VASCULAR INTERVENTION Left 12/30/2018   Procedure: PERIPHERAL VASCULAR INTERVENTION;  Surgeon: [name], MD;  Location: [facility];  Service: Cardiovascular;  Laterality: Left;  SFA    Short Social History:  Social History   Tobacco Use  . Smoking status: Former Smoker    Quit date: 06/14/2018    Years since quitting: 1.1  . Smokeless tobacco: Never Used  . Tobacco comment: pt reports quitting 6 months ago  Substance Use Topics  . Alcohol use: Yes    Alcohol/week: 18.0 standard drinks    Types: 18 Cans of beer per week    Comment: pt reports not drinking for past 6 months    No Known Allergies  Current Outpatient Medications  Medication Sig Dispense Refill  . aspirin 81 MG EC tablet Take 1 tablet (81 mg total) by mouth daily. 90 tablet 1  . atorvastatin (LIPITOR) 10 MG tablet Take 1 tablet (10 mg total) by mouth daily. 90 tablet 0  . blood  glucose meter kit and supplies Dispense based on patient and insurance preference. Use up to four times daily as directed. (FOR ICD-10 E10.9, E11.9). 1 each 0  . clopidogrel (PLAVIX) 75 MG tablet Take 1 tablet (75 mg total) by mouth daily. 30 tablet 6  . empagliflozin (JARDIANCE) 10 MG TABS tablet Take 10 mg by mouth daily. 30 tablet 2  . Lancets MISC  Inject 1 each into the skin 3 (three) times daily as needed. 100 each 3  . metFORMIN (GLUCOPHAGE) 500 MG tablet Take 2 tablets (1,000 mg total) by mouth 2 (two) times daily with a meal. intial week - 2 tabs in am, 1 tab in pm. Increase to 2 tabs BID week 2 if tolerated. 360 tablet 1   No current facility-administered medications for this visit.     Review of Systems  Constitutional:  Constitutional negative. HENT: HENT negative.  Eyes: Eyes negative.  Cardiovascular: Cardiovascular negative.  GI: Gastrointestinal negative.  Musculoskeletal:       Cold feet Skin: Skin negative.  Neurological: Neurological negative. Hematologic: Hematologic/lymphatic negative.  Psychiatric: Psychiatric negative.        Objective:  Objective   Vitals:   08/13/19 1404  BP: (!) 166/74  Pulse: (!) 58  Resp: 20  Temp: 97.9 F (36.6 C)  SpO2: 99%  Weight: 145 lb (65.8 kg)  Height: '5\' 7"'$  (1.702 m)   Body mass index is 22.71 kg/m.  Physical Exam HENT:     Head: Normocephalic.     Nose: Nose normal.  Eyes:     Pupils: Pupils are equal, round, and reactive to light.  Neck:     Vascular: No carotid bruit.  Cardiovascular:     Rate and Rhythm: Normal rate.     Pulses:          Femoral pulses are 2+ on the right side and 2+ on the left side.      Popliteal pulses are 1+ on the right side and 1+ on the left side.  Abdominal:     General: Abdomen is flat.     Palpations: Abdomen is soft. There is no mass.  Musculoskeletal: Normal range of motion.        General: No swelling.  Skin:    General: Skin is dry.     Capillary Refill: Capillary refill takes less than 2 seconds.     Comments: Wounds on feet are healed  Neurological:     General: No focal deficit present.     Mental Status: He is alert.  Psychiatric:        Mood and Affect: Mood normal.        Thought Content: Thought content normal.     Data: ABIs on right 1.01 with toe pressure 105 and left 0.88 with toe pressure 108   Left lower extremity duplex demonstrates biphasic waveforms.  There is focal elevation 384 cm/s at the origin of the SFA consistent with 50 to 74% stenosis.  In the stent itself waveforms are biphasic no evidence of in-stent stenosis.     Assessment/Plan:     58 year old male with previous bilateral drug-coated balloons and and left drug-eluting stent these were all 4 wounds.  All wounds have now healed.  Does appear to have some focal elevation of the SFA on the left side with 50 to 74% stenosis.  Given that he has no symptoms and is now taking Plavix we will not reintervene.  I discussed the urgent need for smoking cessation.  He will follow-up in 6 months with repeat ABIs.  He demonstrates good understanding.     [name] MD [facility]

## 2019-08-18 ENCOUNTER — Encounter: Payer: Self-pay | Admitting: Family Medicine

## 2019-08-18 ENCOUNTER — Ambulatory Visit (INDEPENDENT_AMBULATORY_CARE_PROVIDER_SITE_OTHER): Payer: BC Managed Care – PPO | Admitting: Family Medicine

## 2019-08-18 ENCOUNTER — Other Ambulatory Visit: Payer: Self-pay

## 2019-08-18 VITALS — BP 170/72 | HR 76 | Temp 97.8°F | Wt 148.2 lb

## 2019-08-18 DIAGNOSIS — R739 Hyperglycemia, unspecified: Secondary | ICD-10-CM | POA: Diagnosis not present

## 2019-08-18 DIAGNOSIS — L989 Disorder of the skin and subcutaneous tissue, unspecified: Secondary | ICD-10-CM | POA: Diagnosis not present

## 2019-08-18 DIAGNOSIS — E785 Hyperlipidemia, unspecified: Secondary | ICD-10-CM

## 2019-08-18 DIAGNOSIS — Z23 Encounter for immunization: Secondary | ICD-10-CM

## 2019-08-18 DIAGNOSIS — E1159 Type 2 diabetes mellitus with other circulatory complications: Secondary | ICD-10-CM

## 2019-08-18 DIAGNOSIS — I10 Essential (primary) hypertension: Secondary | ICD-10-CM

## 2019-08-18 LAB — GLUCOSE, POCT (MANUAL RESULT ENTRY): POC Glucose: 149 mg/dl — AB (ref 70–99)

## 2019-08-18 LAB — POCT GLYCOSYLATED HEMOGLOBIN (HGB A1C): Hemoglobin A1C: 9 % — AB (ref 4.0–5.6)

## 2019-08-18 MED ORDER — JARDIANCE 25 MG PO TABS: 25.0000 mg | ORAL_TABLET | Freq: Every day | ORAL | 1 refills | Status: DC

## 2019-08-18 MED ORDER — ATORVASTATIN CALCIUM 10 MG PO TABS: 10.0000 mg | ORAL_TABLET | Freq: Every day | ORAL | 1 refills | Status: DC

## 2019-08-18 MED ORDER — METFORMIN HCL 500 MG PO TABS: 1000.0000 mg | ORAL_TABLET | Freq: Two times a day (BID) | ORAL | 1 refills | Status: DC

## 2019-08-18 NOTE — Progress Notes (Signed)
Subjective:  Patient ID: Stanley Chaney, male    DOB: May 13, 1961  Age: 58 y.o. MRN: [id number]  CC:  Chief Complaint  Patient presents with  . Diabetes     3 months f/u  . Skin lesion    skin lesion has been popping up on skin and would like to have this check. One on back both arms and now 2 weeks ago on popped up on left side of neck. Area on neck is very itchy    HPI Stanley Chaney presents for Follow up.   Speaks some english, but difficulty with understanding - permission obtained for video interpreter. Interpreters [id number], then [id number]. Questions repeated multiple times with interpreter as well for clarification.   Diabetes: Complicated by peripheral artery disease as well as hyperglycemia. Elevated A1c in September.  Referred to ophthalmology.  Based on elevated A1c recommended close follow-up to discuss new med regimen.  He has not had appointment since September.  Currently on Jardiance 10 mg daily, Metformin 1000 mg twice daily.  No new side effects with metformin '1000mg'$  BID, and has taken Jardiance once per day.  On statin - lipitor '10mg'$  qd.  Walking daily with dog.  No current BP meds. Elevated past few visits.  BP Readings from Last 3 Encounters:  08/18/19 (!) 170/72  08/13/19 (!) 166/74  05/27/19 (!) 145/74    Microalbumin: normal ratio 03/17/19 Optho, foot exam, pneumovax:  optho appt ordered 05/19/19 - did not go - called but vision is good.  Foot exam today.   Lab Results  Component Value Date   HGBA1C 9.0 (A) 08/18/2019   HGBA1C 10.4 (H) 05/19/2019   HGBA1C 10.8 (A) 03/03/2019   Lab Results  Component Value Date   LDLCALC 66 05/19/2019   CREATININE 0.96 05/19/2019    Rash/skin lesion: R upper arm.  Past 4 months.  Itching at times.  Similar one on left arm and chest - upper part after few weeks.  Tx: alcohol.  From [place] - sun exposure in past. No personal history of skin cancer.   Not itching now - refuses cream.   History Patient  Active Problem List   Diagnosis Date Noted  . PAD (peripheral artery disease) ([place]) 07/20/2018  . Malnutrition of moderate degree 06/11/2018  . Cellulitis of left foot 06/10/2018  . Diabetes mellitus ([place]) 06/10/2018  . Hyperglycemia 06/10/2018  . Cellulitis 06/10/2018   Past Medical History:  Diagnosis Date  . Diabetes mellitus without complication ([place])   . PAD (peripheral artery disease) ([place])    Past Surgical History:  Procedure Laterality Date  . ABDOMINAL AORTOGRAM W/LOWER EXTREMITY Bilateral 06/15/2018   Procedure: ABDOMINAL AORTOGRAM W/LOWER EXTREMITY;  Surgeon: [name], MD;  Location: [facility];  Service: Cardiovascular;  Laterality: Bilateral;  . ABDOMINAL AORTOGRAM W/LOWER EXTREMITY Left 12/30/2018   Procedure: ABDOMINAL AORTOGRAM W/LOWER EXTREMITY;  Surgeon: [name], MD;  Location: [facility];  Service: Cardiovascular;  Laterality: Left;  . I&D EXTREMITY Left 06/12/2018   Procedure: Excisional Debridement left foot;  Surgeon: [name], MD;  [facility];  Service: Orthopedics;  Laterality: Left;  . LOWER EXTREMITY ANGIOGRAM Right 07/20/2018     Right lower extremity angiogram  . LOWER EXTREMITY ANGIOGRAPHY Right 07/20/2018   Procedure: Lower Extremity Angiography;  Surgeon: [name], MD;  Location: [place] [facility];  Service: Cardiovascular;  Laterality: Right;  . PERIPHERAL VASCULAR ATHERECTOMY Left 06/15/2018   Procedure: PERIPHERAL VASCULAR ATHERECTOMY;  Surgeon: Servando Snare  Harrell Gave, MD;  Location: [facility] [place] [facility];  Service: Cardiovascular;  Laterality: Left;  SFA  . PERIPHERAL VASCULAR BALLOON ANGIOPLASTY Left 06/15/2018   Procedure: PERIPHERAL VASCULAR BALLOON ANGIOPLASTY;  Surgeon: [name], MD;  Location: [facility];  Service: Cardiovascular;  Laterality: Left;  SFA  . PERIPHERAL VASCULAR BALLOON ANGIOPLASTY Right 07/20/2018   Procedure: PERIPHERAL VASCULAR  BALLOON ANGIOPLASTY;  Surgeon: [name], MD;  Location: [place] [place] [facility];  Service: Cardiovascular;  Laterality: Right;  SFA WITH DRUG COATED   . PERIPHERAL VASCULAR INTERVENTION Left 12/30/2018   Procedure: PERIPHERAL VASCULAR INTERVENTION;  Surgeon: [name], MD;  Location: [facility];  Service: Cardiovascular;  Laterality: Left;  SFA   No Known Allergies Prior to Admission medications   Medication Sig Start Date End Date Taking? Authorizing Provider  aspirin 81 MG EC tablet Take 1 tablet (81 mg total) by mouth daily. 07/15/18  Yes Clent Demark, PA-C  atorvastatin (LIPITOR) 10 MG tablet Take 1 tablet (10 mg total) by mouth daily. 04/07/19  Yes Wendie Agreste, MD  blood glucose meter kit and supplies Dispense based on patient and insurance preference. Use up to four times daily as directed. (FOR ICD-10 E10.9, E11.9). 06/16/18  Yes Domenic Polite, MD  clopidogrel (PLAVIX) 75 MG tablet Take 1 tablet (75 mg total) by mouth daily. 05/27/19  Yes Nickel, Sharmon Leyden, NP  empagliflozin (JARDIANCE) 10 MG TABS tablet Take 10 mg by mouth daily. 04/07/19  Yes Wendie Agreste, MD  Lancets MISC Inject 1 each into the skin 3 (three) times daily as needed. 09/14/18  Yes Clent Demark, PA-C  metFORMIN (GLUCOPHAGE) 500 MG tablet Take 2 tablets (1,000 mg total) by mouth 2 (two) times daily with a meal. intial week - 2 tabs in am, 1 tab in pm. Increase to 2 tabs BID week 2 if tolerated. 03/17/19  Yes Wendie Agreste, MD   Social History   Socioeconomic History  . Marital status: Married    Spouse name: Not on file  . Number of children: Not on file  . Years of education: Not on file  . Highest education level: Not on file  Occupational History  . Not on file  Tobacco Use  . Smoking status: Former Smoker    Quit date: 06/14/2018    Years since quitting: 1.1  . Smokeless tobacco: Never Used  . Tobacco comment: pt reports quitting 6 months ago  Substance  and Sexual Activity  . Alcohol use: Yes    Alcohol/week: 18.0 standard drinks    Types: 18 Cans of beer per week    Comment: pt reports not drinking for past 6 months  . Drug use: Never  . Sexual activity: Not on file  Other Topics Concern  . Not on file  Social History Narrative  . Not on file   Social Determinants of Health   Financial Resource Strain:   . Difficulty of Paying Living Expenses: Not on file  Food Insecurity:   . Worried About Charity fundraiser in the Last Year: Not on file  . Ran Out of Food in the Last Year: Not on file  Transportation Needs:   . Lack of Transportation (Medical): Not on file  . Lack of Transportation (Non-Medical): Not on file  Physical Activity:   . Days of Exercise per Week: Not on file  . Minutes of Exercise per Session: Not on file  Stress:   . Feeling of  Stress : Not on file  Social Connections:   . Frequency of Communication with Friends and Family: Not on file  . Frequency of Social Gatherings with Friends and Family: Not on file  . Attends Religious Services: Not on file  . Active Member of Clubs or Organizations: Not on file  . Attends Archivist Meetings: Not on file  . Marital Status: Not on file  Intimate Partner Violence:   . Fear of Current or Ex-Partner: Not on file  . Emotionally Abused: Not on file  . Physically Abused: Not on file  . Sexually Abused: Not on file    Review of Systems Per HPI.   Objective:   Vitals:   08/18/19 1404  BP: (!) 170/72  Pulse: 76  Temp: 97.8 F (36.6 C)  TempSrc: Temporal  SpO2: 100%  Weight: 148 lb 3.2 oz (67.2 kg)    BP Readings from Last 3 Encounters:  08/18/19 (!) 170/72  08/13/19 (!) 166/74  05/27/19 (!) 145/74    Physical Exam Vitals signs reviewed.  Constitutional:      Appearance: He is well-developed.  HENT:     Head: Normocephalic and atraumatic.  Eyes:     Pupils: Pupils are equal, round, and reactive to light.  Neck:     Vascular: No carotid  bruit or JVD.  Cardiovascular:     Rate and Rhythm: Normal rate and regular rhythm.     Heart sounds: Normal heart sounds. No murmur.  Pulmonary:     Effort: Pulmonary effort is normal.     Breath sounds: Normal breath sounds. No rales.  Skin:    General: Skin is warm and dry.       Neurological:     Mental Status: He is alert and oriented to person, place, and time.     Results for orders placed or performed in visit on 08/18/19  POCT glucose (manual entry)  Result Value Ref Range   POC Glucose 149 (A) 70 - 99 mg/dl  POCT glycosylated hemoglobin (Hb A1C)  Result Value Ref Range   Hemoglobin A1C 9.0 (A) 4.0 - 5.6 %   HbA1c POC (<> result, manual entry)     HbA1c, POC (prediabetic range)     HbA1c, POC (controlled diabetic range)        Assessment & Plan:  Stanley Chaney is a 58 y.o. male . Type 2 diabetes mellitus with other circulatory complication, without long-term current use of insulin ([place]) - Plan: POCT glucose (manual entry), POCT glycosylated hemoglobin (Hb A1C), Ambulatory referral to Ophthalmology, metFORMIN (GLUCOPHAGE) 500 MG tablet, empagliflozin (JARDIANCE) 25 MG TABS tablet Hyperglycemia - Plan: POCT glucose (manual entry), POCT glycosylated hemoglobin (Hb A1C)  -Improving A1c, in office glucose better as well.  Will increase Jardiance to full 25 mg tablets but discussed potential need for GLP-1 injection for improved control in addition to current Metformin.  He declined additional medication at this time.  Potential complications of untreated diabetes were discussed.  Recheck 3 months.  Arm skin lesion, right - Plan: Ambulatory referral to Dermatology  -Less itching now, declined topical treatment.  Referred to dermatology for evaluation  Hyperlipidemia, unspecified hyperlipidemia type - Plan: atorvastatin (LIPITOR) 10 MG tablet  -Tolerating Lipitor continue same.  Essential hypertension - Plan: lisinopril (ZESTRIL) 10 MG tablet  -Discussed after his  visit, persistent elevations noted.  Start lisinopril 10 mg daily, orthostatic precautions given.  Understanding expressed.  Meds ordered this encounter  Medications  . metFORMIN (GLUCOPHAGE) 500 MG  tablet    Sig: Take 2 tablets (1,000 mg total) by mouth 2 (two) times daily with a meal. i    Dispense:  360 tablet    Refill:  1  . atorvastatin (LIPITOR) 10 MG tablet    Sig: Take 1 tablet (10 mg total) by mouth daily.    Dispense:  90 tablet    Refill:  1  . empagliflozin (JARDIANCE) 25 MG TABS tablet    Sig: Take 25 mg by mouth daily before breakfast.    Dispense:  90 tablet    Refill:  1  . lisinopril (ZESTRIL) 10 MG tablet    Sig: Take 1 tablet (10 mg total) by mouth daily.    Dispense:  90 tablet    Refill:  1   Patient Instructions  I will refer you to dermatologist for arm bump.   Increase jardiance to 25 mg per day.  As we discussed, blood sugar is better, but still high. You may need an injection once per week for improved control.  Continue to watch diet. Recheck levels in 3 months.   Return to the clinic or go to the nearest emergency room if any of your symptoms worsen or new symptoms occur.       Signed, [name], MD [facility]

## 2019-08-18 NOTE — Patient Instructions (Addendum)
I will refer you to dermatologist for arm bump.   Increase jardiance to 25 mg per day.  As we discussed, blood sugar is better, but still high. You may need an injection once per week for improved control.  Continue to watch diet. Recheck levels in 3 months.   Return to the clinic or go to the nearest emergency room if any of your symptoms worsen or new symptoms occur.

## 2019-08-19 ENCOUNTER — Encounter: Payer: Self-pay | Admitting: Family Medicine

## 2019-08-19 MED ORDER — LISINOPRIL 10 MG PO TABS: 10.0000 mg | ORAL_TABLET | Freq: Every day | ORAL | 1 refills | Status: DC

## 2019-09-01 DIAGNOSIS — H35033 Hypertensive retinopathy, bilateral: Secondary | ICD-10-CM | POA: Diagnosis not present

## 2019-09-01 DIAGNOSIS — H2513 Age-related nuclear cataract, bilateral: Secondary | ICD-10-CM | POA: Diagnosis not present

## 2019-09-01 DIAGNOSIS — E113393 Type 2 diabetes mellitus with moderate nonproliferative diabetic retinopathy without macular edema, bilateral: Secondary | ICD-10-CM | POA: Diagnosis not present

## 2019-09-01 DIAGNOSIS — H353132 Nonexudative age-related macular degeneration, bilateral, intermediate dry stage: Secondary | ICD-10-CM | POA: Diagnosis not present

## 2019-09-01 LAB — HM DIABETES EYE EXAM

## 2019-09-06 ENCOUNTER — Other Ambulatory Visit: Payer: Self-pay | Admitting: *Deleted

## 2019-09-06 DIAGNOSIS — I779 Disorder of arteries and arterioles, unspecified: Secondary | ICD-10-CM

## 2019-09-30 ENCOUNTER — Encounter: Payer: Self-pay | Admitting: Family Medicine

## 2019-11-17 ENCOUNTER — Other Ambulatory Visit: Payer: Self-pay

## 2019-11-17 ENCOUNTER — Ambulatory Visit (INDEPENDENT_AMBULATORY_CARE_PROVIDER_SITE_OTHER): Payer: BC Managed Care – PPO | Admitting: Family Medicine

## 2019-11-17 ENCOUNTER — Encounter: Payer: Self-pay | Admitting: Family Medicine

## 2019-11-17 VITALS — BP 111/75 | HR 78 | Temp 97.9°F | Ht 70.0 in | Wt 149.0 lb

## 2019-11-17 DIAGNOSIS — E785 Hyperlipidemia, unspecified: Secondary | ICD-10-CM

## 2019-11-17 DIAGNOSIS — Z9114 Patient's other noncompliance with medication regimen: Secondary | ICD-10-CM

## 2019-11-17 DIAGNOSIS — I739 Peripheral vascular disease, unspecified: Secondary | ICD-10-CM

## 2019-11-17 DIAGNOSIS — E1159 Type 2 diabetes mellitus with other circulatory complications: Secondary | ICD-10-CM

## 2019-11-17 LAB — POCT GLYCOSYLATED HEMOGLOBIN (HGB A1C): Hemoglobin A1C: 9.1 % — AB (ref 4.0–5.6)

## 2019-11-17 LAB — GLUCOSE, POCT (MANUAL RESULT ENTRY): POC Glucose: 229 mg/dl — AB (ref 70–99)

## 2019-11-17 MED ORDER — JARDIANCE 25 MG PO TABS: 25.0000 mg | ORAL_TABLET | Freq: Every day | ORAL | 1 refills | Status: DC

## 2019-11-17 MED ORDER — ATORVASTATIN CALCIUM 10 MG PO TABS: 10.0000 mg | ORAL_TABLET | Freq: Every day | ORAL | 1 refills | Status: DC

## 2019-11-17 MED ORDER — METFORMIN HCL 500 MG PO TABS: 1000.0000 mg | ORAL_TABLET | Freq: Two times a day (BID) | ORAL | 1 refills | Status: DC

## 2019-11-17 NOTE — Progress Notes (Signed)
Subjective:  Patient ID: Stanley Chaney, male    DOB: 09/04/1961  Age: 59 y.o. MRN: [id number]  CC:  Chief Complaint  Patient presents with  . Follow-up    on diabetes. pt states he feels fine and that he has his diabetes under control. pt dose not check BS at home.    HPI Stanley Chaney presents for   Diabetes: Complicated by hyperglycemia and peripheral arterial disease. Improvement A1c in December.  Was on Jardiance 10 mg daily, Metformin 1000 mg twice daily.  He is on statin Lipitor 10 mg daily.  Jardiance was increased to 25 mg daily.  Start on lisinopril 10 mg daily for blood pressure as well as nephro protection. Microalbumin: Normal ratio 03/17/2019 Optho, foot exam, pneumovax: Up-to-date  Stopped all meds past 2 months -as he felt good.  Walking dog, feels better.  Not taking plavix either.  No side effects with prior meds.  Not checking home blood sugars.  No nausea or vomiting. No recent alcohol - stopped after foot issue in 06/2018.   BP Readings from Last 3 Encounters:  11/17/19 111/75  08/18/19 (!) 170/72  08/13/19 (!) 166/74    Lab Results  Component Value Date   HGBA1C 9.1 (A) 11/17/2019   HGBA1C 9.0 (A) 08/18/2019   HGBA1C 10.4 (H) 05/19/2019   Lab Results  Component Value Date   LDLCALC 66 05/19/2019   CREATININE 0.96 05/19/2019    History Patient Active Problem List   Diagnosis Date Noted  . PAD (peripheral artery disease) ([place]) 07/20/2018  . Malnutrition of moderate degree 06/11/2018  . Cellulitis of left foot 06/10/2018  . Diabetes mellitus ([place]) 06/10/2018  . Hyperglycemia 06/10/2018  . Cellulitis 06/10/2018   Past Medical History:  Diagnosis Date  . Diabetes mellitus without complication ([place])   . PAD (peripheral artery disease) ([place])    Past Surgical History:  Procedure Laterality Date  . ABDOMINAL AORTOGRAM W/LOWER EXTREMITY Bilateral 06/15/2018   Procedure: ABDOMINAL AORTOGRAM W/LOWER EXTREMITY;  Surgeon: [name], MD;  Location: [place] [facility];  Service: Cardiovascular;  Laterality: Bilateral;  . ABDOMINAL AORTOGRAM W/LOWER EXTREMITY Left 12/30/2018   Procedure: ABDOMINAL AORTOGRAM W/LOWER EXTREMITY;  Surgeon: [name], MD;  Location: [place] [facility];  Service: Cardiovascular;  Laterality: Left;  . I & D EXTREMITY Left 06/12/2018   Procedure: Excisional Debridement left foot;  Surgeon: [name], MD;  [facility];  Service: Orthopedics;  Laterality: Left;  . LOWER EXTREMITY ANGIOGRAM Right 07/20/2018     Right lower extremity angiogram  . LOWER EXTREMITY ANGIOGRAPHY Right 07/20/2018   Procedure: Lower Extremity Angiography;  Surgeon: [name], MD;  Location: [facility] [facility];  Service: Cardiovascular;  Laterality: Right;  . PERIPHERAL VASCULAR ATHERECTOMY Left 06/15/2018   Procedure: PERIPHERAL VASCULAR ATHERECTOMY;  Surgeon: [name], MD;  Location: [place] [facility];  Service: Cardiovascular;  Laterality: Left;  SFA  . PERIPHERAL VASCULAR BALLOON ANGIOPLASTY Left 06/15/2018   Procedure: PERIPHERAL VASCULAR BALLOON ANGIOPLASTY;  Surgeon: [name], MD;  Location: [place] [facility];  Service: Cardiovascular;  Laterality: Left;  SFA  . PERIPHERAL VASCULAR BALLOON ANGIOPLASTY Right 07/20/2018   Procedure: PERIPHERAL VASCULAR BALLOON ANGIOPLASTY;  Surgeon: [name], MD;  Location: [place];  Service: Cardiovascular;  Laterality: Right;  SFA WITH DRUG COATED   . PERIPHERAL VASCULAR INTERVENTION Left 12/30/2018   Procedure: PERIPHERAL VASCULAR INTERVENTION;  Surgeon: [name], MD;  Location: [place] [place] [facility];  Service: Cardiovascular;  Laterality: Left;  SFA   No Known Allergies Prior to Admission medications   Medication Sig Start Date End Date Taking? Authorizing Provider  aspirin 81 MG EC tablet Take 1 tablet (81 mg total) by mouth daily. 07/15/18  Yes Clent Demark, PA-C  atorvastatin (LIPITOR) 10 MG tablet Take 1 tablet (10 mg total) by mouth daily. 08/18/19  Yes Wendie Agreste, MD  blood glucose meter kit and supplies Dispense based on patient and insurance preference. Use up to four times daily as directed. (FOR ICD-10 E10.9, E11.9). 06/16/18  Yes Domenic Polite, MD  empagliflozin (JARDIANCE) 25 MG TABS tablet Take 25 mg by mouth daily before breakfast. 08/18/19  Yes Wendie Agreste, MD  Lancets MISC Inject 1 each into the skin 3 (three) times daily as needed. 09/14/18  Yes Clent Demark, PA-C  lisinopril (ZESTRIL) 10 MG tablet Take 1 tablet (10 mg total) by mouth daily. 08/19/19  Yes Wendie Agreste, MD  metFORMIN (GLUCOPHAGE) 500 MG tablet Take 2 tablets (1,000 mg total) by mouth 2 (two) times daily with a meal. i 08/18/19  Yes Wendie Agreste, MD  clopidogrel (PLAVIX) 75 MG tablet Take 1 tablet (75 mg total) by mouth daily. Patient not taking: Reported on 11/17/2019 05/27/19   Nickel, Sharmon Leyden, NP   Social History   Socioeconomic History  . Marital status: Married    Spouse name: Not on file  . Number of children: Not on file  . Years of education: Not on file  . Highest education level: Not on file  Occupational History  . Not on file  Tobacco Use  . Smoking status: Former Smoker    Quit date: 06/14/2018    Years since quitting: 1.4  . Smokeless tobacco: Never Used  . Tobacco comment: pt reports quitting 6 months ago  Substance and Sexual Activity  . Alcohol use: Yes    Alcohol/week: 18.0 standard drinks    Types: 18 Cans of beer per week    Comment: pt reports not drinking for past 6 months  . Drug use: Never  . Sexual activity: Not on file  Other Topics Concern  . Not on file  Social History Narrative  . Not on file   Social Determinants of Health   Financial Resource Strain:   . Difficulty of Paying Living Expenses: Not on file  Food Insecurity:   . Worried About Charity fundraiser in the Last Year: Not on file   . Ran Out of Food in the Last Year: Not on file  Transportation Needs:   . Lack of Transportation (Medical): Not on file  . Lack of Transportation (Non-Medical): Not on file  Physical Activity:   . Days of Exercise per Week: Not on file  . Minutes of Exercise per Session: Not on file  Stress:   . Feeling of Stress : Not on file  Social Connections:   . Frequency of Communication with Friends and Family: Not on file  . Frequency of Social Gatherings with Friends and Family: Not on file  . Attends Religious Services: Not on file  . Active Member of Clubs or Organizations: Not on file  . Attends Archivist Meetings: Not on file  . Marital Status: Not on file  Intimate Partner Violence:   . Fear of Current or Ex-Partner: Not on file  . Emotionally Abused: Not on file  . Physically Abused: Not on file  . Sexually Abused:  Not on file    Review of Systems  Eyes: Negative for visual disturbance.  Respiratory: Negative for cough, chest tightness and shortness of breath.   Cardiovascular: Negative for chest pain, palpitations and leg swelling.  Gastrointestinal: Negative for abdominal pain, blood in stool, nausea and vomiting.  Neurological: Negative for dizziness, light-headedness and headaches.   Other Per HPI.    Objective:   Vitals:   11/17/19 1506  BP: 111/75  Pulse: 78  Temp: 97.9 F (36.6 C)  TempSrc: Temporal  SpO2: 98%  Weight: 149 lb (67.6 kg)  Height: '5\' 10"'$  (1.778 m)     Physical Exam Vitals reviewed.  Constitutional:      Appearance: He is well-developed.  HENT:     Head: Normocephalic and atraumatic.  Eyes:     Pupils: Pupils are equal, round, and reactive to light.  Neck:     Vascular: No carotid bruit or JVD.  Cardiovascular:     Rate and Rhythm: Normal rate and regular rhythm.     Heart sounds: Normal heart sounds. No murmur.  Pulmonary:     Effort: Pulmonary effort is normal.     Breath sounds: Normal breath sounds. No rales.   Skin:    General: Skin is warm and dry.  Neurological:     Mental Status: He is alert and oriented to person, place, and time.  Psychiatric:        Mood and Affect: Mood normal.      Results for orders placed or performed in visit on 11/17/19  POCT glucose (manual entry)  Result Value Ref Range   POC Glucose 229 (A) 70 - 99 mg/dl  POCT glycosylated hemoglobin (Hb A1C)  Result Value Ref Range   Hemoglobin A1C 9.1 (A) 4.0 - 5.6 %   HbA1c POC (<> result, manual entry)     HbA1c, POC (prediabetic range)     HbA1c, POC (controlled diabetic range)       Assessment & Plan:  Stanley Chaney is a 59 y.o. male . Type 2 diabetes mellitus with other circulatory complication, without long-term current use of insulin ([facility]) - Plan: POCT glucose (manual entry), POCT glycosylated hemoglobin (Hb A1C), metFORMIN (GLUCOPHAGE) 500 MG tablet, empagliflozin (JARDIANCE) 25 MG TABS tablet Nonadherence to medication - Plan: POCT glucose (manual entry), POCT glycosylated hemoglobin (Hb A1C)  - unfortunately stopped meds on own - denies any side effects. Risks of nonadherence and continued uncontrolled diabetes discussed with understanding expressed.   - restart metformin, jardiance. Recheck in 6 weeks. Labs as above. Hold on ACE-I based on BP today.   PAD (peripheral artery disease) ([facility])  - plans to restart plavix.   Hyperlipidemia, unspecified hyperlipidemia type - Plan: Lipid panel, Comprehensive metabolic panel, atorvastatin (LIPITOR) 10 MG tablet  - check labs, restart lipitor, recheck 6 weeks.   Meds ordered this encounter  Medications  . metFORMIN (GLUCOPHAGE) 500 MG tablet    Sig: Take 2 tablets (1,000 mg total) by mouth 2 (two) times daily with a meal. i    Dispense:  360 tablet    Refill:  1  . atorvastatin (LIPITOR) 10 MG tablet    Sig: Take 1 tablet (10 mg total) by mouth daily.    Dispense:  90 tablet    Refill:  1  . empagliflozin (JARDIANCE) 25 MG TABS tablet    Sig: Take 25 mg  by mouth daily before breakfast.    Dispense:  90 tablet    Refill:  1  Patient Instructions     In the future, please discuss any changes or plans to stop medicine with your medical care provider.   Restart atorvastatin for cholesterol.  Restart metformin and empaglifozin for diabetes.  Blood pressure ok today.  recheck in 6 weeks.   If you have lab work done today you will be contacted with your lab results within the next 2 weeks.  If you have not heard from [place] then please contact us. The fastest way to get your results is to register for My Chart.   IF you received an x-ray today, you will receive an invoice from [facility]. Please contact [facility] at [phone] with questions or concerns regarding your invoice.   IF you received labwork today, you will receive an invoice from [facility]. Please contact [facility] at [phone] with questions or concerns regarding your invoice.   Our billing staff will not be able to assist you with questions regarding bills from these companies.  You will be contacted with the lab results as soon as they are available. The fastest way to get your results is to activate your My Chart account. Instructions are located on the last page of this paperwork. If you have not heard from [place] regarding the results in 2 weeks, please contact this office.         Signed, [name], MD [facility]

## 2019-11-17 NOTE — Patient Instructions (Addendum)
   In the future, please discuss any changes or plans to stop medicine with your medical care provider.   Restart atorvastatin for cholesterol.  Restart metformin and empaglifozin for diabetes.  Blood pressure ok today.  recheck in 6 weeks.   If you have lab work done today you will be contacted with your lab results within the next 2 weeks.  If you have not heard from [place] then please contact us. The fastest way to get your results is to register for My Chart.   IF you received an x-ray today, you will receive an invoice from [facility]. Please contact [facility] at [phone] with questions or concerns regarding your invoice.   IF you received labwork today, you will receive an invoice from [facility]. Please contact [facility] at [phone] with questions or concerns regarding your invoice.   Our billing staff will not be able to assist you with questions regarding bills from these companies.  You will be contacted with the lab results as soon as they are available. The fastest way to get your results is to activate your My Chart account. Instructions are located on the last page of this paperwork. If you have not heard from [place] regarding the results in 2 weeks, please contact this office.

## 2019-11-18 LAB — COMPREHENSIVE METABOLIC PANEL
ALT: 13 IU/L (ref 0–44)
AST: 15 IU/L (ref 0–40)
Albumin/Globulin Ratio: 1.7 (ref 1.2–2.2)
Albumin: 4.7 g/dL (ref 3.8–4.9)
Alkaline Phosphatase: 168 IU/L — ABNORMAL HIGH (ref 39–117)
BUN/Creatinine Ratio: 16 (ref 10–24)
BUN: 15 mg/dL (ref 8–27)
Bilirubin Total: 0.4 mg/dL (ref 0.0–1.2)
CO2: 24 mmol/L (ref 20–29)
Calcium: 10.5 mg/dL — ABNORMAL HIGH (ref 8.6–10.2)
Chloride: 98 mmol/L (ref 96–106)
Creatinine, Ser: 0.96 mg/dL (ref 0.76–1.27)
GFR calc Af Amer: 99 mL/min/{1.73_m2} (ref >59)
GFR calc non Af Amer: 86 mL/min/{1.73_m2} (ref >59)
Globulin, Total: 2.7 g/dL (ref 1.5–4.5)
Glucose: 233 mg/dL — ABNORMAL HIGH (ref 65–99)
Potassium: 5.3 mmol/L — ABNORMAL HIGH (ref 3.5–5.2)
Sodium: 139 mmol/L (ref 134–144)
Total Protein: 7.4 g/dL (ref 6.0–8.5)

## 2019-11-18 LAB — LIPID PANEL
Chol/HDL Ratio: 3.1 ratio (ref 0.0–5.0)
Cholesterol, Total: 213 mg/dL — ABNORMAL HIGH (ref 100–199)
HDL: 68 mg/dL (ref >39)
LDL Chol Calc (NIH): 113 mg/dL — ABNORMAL HIGH (ref 0–99)
Triglycerides: 187 mg/dL — ABNORMAL HIGH (ref 0–149)
VLDL Cholesterol Cal: 32 mg/dL (ref 5–40)

## 2019-12-02 ENCOUNTER — Encounter: Payer: Self-pay | Admitting: Radiology

## 2019-12-29 ENCOUNTER — Ambulatory Visit: Payer: BC Managed Care – PPO | Admitting: Family Medicine

## 2019-12-31 ENCOUNTER — Encounter: Payer: Self-pay | Admitting: Family Medicine

## 2020-04-23 ENCOUNTER — Emergency Department (HOSPITAL_COMMUNITY)
Admission: EM | Admit: 2020-04-23 | Discharge: 2020-04-23 | Disposition: A | Discharge status: Home / Self Care | Payer: Self-pay | Attending: Emergency Medicine | Admitting: Emergency Medicine

## 2020-04-23 ENCOUNTER — Encounter (HOSPITAL_COMMUNITY): Payer: Self-pay

## 2020-04-23 ENCOUNTER — Other Ambulatory Visit: Payer: Self-pay

## 2020-04-23 DIAGNOSIS — Z5321 Procedure and treatment not carried out due to patient leaving prior to being seen by health care provider: Secondary | ICD-10-CM | POA: Insufficient documentation

## 2020-04-23 DIAGNOSIS — K0889 Other specified disorders of teeth and supporting structures: Secondary | ICD-10-CM | POA: Insufficient documentation

## 2020-04-23 DIAGNOSIS — H9201 Otalgia, right ear: Secondary | ICD-10-CM | POA: Insufficient documentation

## 2020-04-23 DIAGNOSIS — R220 Localized swelling, mass and lump, head: Secondary | ICD-10-CM | POA: Insufficient documentation

## 2020-04-23 NOTE — ED Triage Notes (Signed)
Pt presents with c/o dental pain on the right lower side for 4 x 5 days. Pt also reporting some jaw swelling with the pain and pain into his right ear.

## 2020-06-16 ENCOUNTER — Encounter: Payer: Self-pay | Admitting: Registered Nurse

## 2020-06-16 ENCOUNTER — Ambulatory Visit (INDEPENDENT_AMBULATORY_CARE_PROVIDER_SITE_OTHER): Payer: Self-pay | Admitting: Registered Nurse

## 2020-06-16 ENCOUNTER — Other Ambulatory Visit: Payer: Self-pay

## 2020-06-16 VITALS — BP 121/74 | HR 86 | Temp 97.6°F | Resp 17 | Wt 139.2 lb

## 2020-06-16 DIAGNOSIS — M79605 Pain in left leg: Secondary | ICD-10-CM

## 2020-06-16 DIAGNOSIS — M79604 Pain in right leg: Secondary | ICD-10-CM

## 2020-06-16 DIAGNOSIS — Z23 Encounter for immunization: Secondary | ICD-10-CM

## 2020-06-16 NOTE — Patient Instructions (Signed)
° ° ° °  If you have lab work done today you will be contacted with your lab results within the next 2 weeks.  If you have not heard from us then please contact us. The fastest way to get your results is to register for My Chart. ° ° °IF you received an x-ray today, you will receive an invoice from [facility]. Please contact [facility] at [phone] with questions or concerns regarding your invoice.  ° °IF you received labwork today, you will receive an invoice from [facility]. Please contact [facility] at [phone] with questions or concerns regarding your invoice.  ° °Our billing staff will not be able to assist you with questions regarding bills from these companies. ° °You will be contacted with the lab results as soon as they are available. The fastest way to get your results is to activate your My Chart account. Instructions are located on the last page of this paperwork. If you have not heard from us regarding the results in 2 weeks, please contact this office. °  ° ° ° °

## 2020-06-20 ENCOUNTER — Telehealth: Payer: Self-pay

## 2020-06-20 MED ORDER — GABAPENTIN 300 MG PO CAPS: 300.0000 mg | ORAL_CAPSULE | Freq: Three times a day (TID) | ORAL | 3 refills | Status: DC

## 2020-06-20 NOTE — Telephone Encounter (Signed)
Pt asking about Rx

## 2020-06-20 NOTE — Telephone Encounter (Signed)
Spoke with patient's son. Reports patient having right lower leg pain with rest x 2 weeks that slightly improves when walking. Denies any swelling, redness or temperature changes.   Went to PCP office on 06/16/20 and no evidence of blood clot. States suspects ligament damage. Reports patient had injury to leg 45 years ago. Denies similar pain to leg in past.   Patient last seen Dr. Donzetta Matters 08/13/19 and expected to f/u in 6 months with ABI. Advised will have scheduling contact with an appt. Voiced understanding. Discussed with office PA, [place] with plan.

## 2020-06-20 NOTE — Telephone Encounter (Signed)
Pt. Was seen by Mr. Morrow on 06/16/21. Pt. Asserted they were supposed to be represcribed medications by the provider and was waiting to hear back from the NP about the course of action he was to be discussing with Dr. Carlota Raspberry

## 2020-06-20 NOTE — Telephone Encounter (Signed)
Sent,  Thanks,  Rich

## 2020-06-21 ENCOUNTER — Other Ambulatory Visit: Payer: Self-pay

## 2020-06-21 DIAGNOSIS — I779 Disorder of arteries and arterioles, unspecified: Secondary | ICD-10-CM

## 2020-06-23 ENCOUNTER — Ambulatory Visit (HOSPITAL_COMMUNITY)
Admission: RE | Admit: 2020-06-23 | Discharge: 2020-06-23 | Disposition: A | Discharge status: Home / Self Care | Payer: Self-pay | Source: Ambulatory Visit | Attending: Vascular Surgery | Admitting: Vascular Surgery

## 2020-06-23 ENCOUNTER — Ambulatory Visit (INDEPENDENT_AMBULATORY_CARE_PROVIDER_SITE_OTHER): Payer: Self-pay | Admitting: Physician Assistant

## 2020-06-23 ENCOUNTER — Other Ambulatory Visit: Payer: Self-pay

## 2020-06-23 VITALS — BP 137/70 | HR 73 | Temp 98.1°F | Resp 20 | Ht 70.0 in | Wt 141.4 lb

## 2020-06-23 DIAGNOSIS — F17200 Nicotine dependence, unspecified, uncomplicated: Secondary | ICD-10-CM

## 2020-06-23 DIAGNOSIS — I739 Peripheral vascular disease, unspecified: Secondary | ICD-10-CM

## 2020-06-23 DIAGNOSIS — I779 Disorder of arteries and arterioles, unspecified: Secondary | ICD-10-CM | POA: Insufficient documentation

## 2020-06-23 MED ORDER — CLOPIDOGREL BISULFATE 75 MG PO TABS: 75.0000 mg | ORAL_TABLET | Freq: Every day | ORAL | 6 refills | Status: DC

## 2020-06-23 MED ORDER — ATORVASTATIN CALCIUM 10 MG PO TABS: 10.0000 mg | ORAL_TABLET | Freq: Every day | ORAL | Status: DC

## 2020-06-23 NOTE — Progress Notes (Signed)
Office Note     CC:  follow up Requesting Provider:  Wendie Agreste, MD  HPI: Stanley Chaney is a 59 y.o. (1960-10-17) male who presents for follow-up of peripheral arterial disease.  He is complaining of increasing right knee pain that radiates to his lower leg particularly upon rising in the morning.  He states the pain improves with walking.  He presents today using a rolling walker.  He does not describe classic rest pain at night.  He reports right knee fracture in the remote past with chronic pain.    The patient's wife became ill this year and unfortunately passed away.  The patient missed his follow-up appointment, is quit taking Plavix and Lipitor and continues to use tobacco.  Additionally, he does not take his medications any longer for diabetes.  He states he manages these conditions with diet.  He denies any lower extremity wounds at this time.  He had a left SFA Elluvia stent placed on December 30, 2018 by Dr. Donzetta Matters.  He had initially undergone drug coated balloon angioplasty and atherectomy of the left SFA in October of 2019. He is also s/patherectomyofright SFA and drug-coated balloon angioplasty of right SFAon 07-20-18.  He was last seen in our office on August 13, 2019 by Dr. Donzetta Matters.  His lower extremity wounds had all healed and he was asymptomatic.  He was compliant with his Plavix.  His right ABI at that time was 1 and his left was 0.8.  His toe pressures were approximately 100 bilaterally.  He appeared to have some focal disease at the origin of the left SFA with approximately 50 to 74% stenosis on his duplex ultrasound exam.  Arrangements were made for follow-up in 6 months with repeat ABIs.  He is compliant with aspirin only.  Not taking Plavix and statin. Review of laboratory data in epic chart reveals a hemoglobin A1c of 9.1 on November 17, 2019 Past Medical History:  Diagnosis Date  . Diabetes mellitus without complication ([place])   . PAD (peripheral artery disease)  ([place])     Past Surgical History:  Procedure Laterality Date  . ABDOMINAL AORTOGRAM W/LOWER EXTREMITY Bilateral 06/15/2018   Procedure: ABDOMINAL AORTOGRAM W/LOWER EXTREMITY;  Surgeon: [name], MD;  Location: [facility];  Service: Cardiovascular;  Laterality: Bilateral;  . ABDOMINAL AORTOGRAM W/LOWER EXTREMITY Left 12/30/2018   Procedure: ABDOMINAL AORTOGRAM W/LOWER EXTREMITY;  Surgeon: [name], MD;  Location: [facility];  Service: Cardiovascular;  Laterality: Left;  . I & D EXTREMITY Left 06/12/2018   Procedure: Excisional Debridement left foot;  Surgeon: [name], MD;  Location: [facility];  Service: Orthopedics;  Laterality: Left;  . LOWER EXTREMITY ANGIOGRAM Right 07/20/2018     Right lower extremity angiogram  . LOWER EXTREMITY ANGIOGRAPHY Right 07/20/2018   Procedure: Lower Extremity Angiography;  Surgeon: [name], MD;  Location: [facility];  Service: Cardiovascular;  Laterality: Right;  . PERIPHERAL VASCULAR ATHERECTOMY Left 06/15/2018   Procedure: PERIPHERAL VASCULAR ATHERECTOMY;  Surgeon: [name], MD;  Location: [facility];  Service: Cardiovascular;  Laterality: Left;  SFA  . PERIPHERAL VASCULAR BALLOON ANGIOPLASTY Left 06/15/2018   Procedure: PERIPHERAL VASCULAR BALLOON ANGIOPLASTY;  Surgeon: [name], MD;  Location: [facility];  Service: Cardiovascular;  Laterality: Left;  SFA  . PERIPHERAL VASCULAR BALLOON ANGIOPLASTY Right 07/20/2018   Procedure: PERIPHERAL VASCULAR BALLOON ANGIOPLASTY;  Surgeon: [name], MD;  Location: [facility];  Service: Cardiovascular;  Laterality: Right;  SFA WITH DRUG COATED   . PERIPHERAL VASCULAR INTERVENTION Left 12/30/2018   Procedure: PERIPHERAL VASCULAR INTERVENTION;  Surgeon: [name], MD;  Location: [facility];  Service: Cardiovascular;  Laterality: Left;  SFA    Social History    Socioeconomic History  . Marital status: Widowed    Spouse name: Not on file  . Number of children: Not on file  . Years of education: Not on file  . Highest education level: Not on file  Occupational History  . Not on file  Tobacco Use  . Smoking status: Former Smoker    Quit date: 06/14/2018    Years since quitting: 2.0  . Smokeless tobacco: Never Used  . Tobacco comment: pt reports quitting 6 months ago  Vaping Use  . Vaping Use: Never used  Substance and Sexual Activity  . Alcohol use: Yes    Alcohol/week: 18.0 standard drinks    Types: 18 Cans of beer per week    Comment: pt reports not drinking for past 6 months  . Drug use: Never  . Sexual activity: Not on file  Other Topics Concern  . Not on file  Social History Narrative  . Not on file   Social Determinants of Health   Financial Resource Strain:   . Difficulty of Paying Living Expenses: Not on file  Food Insecurity:   . Worried About Charity fundraiser in the Last Year: Not on file  . Ran Out of Food in the Last Year: Not on file  Transportation Needs:   . Lack of Transportation (Medical): Not on file  . Lack of Transportation (Non-Medical): Not on file  Physical Activity:   . Days of Exercise per Week: Not on file  . Minutes of Exercise per Session: Not on file  Stress:   . Feeling of Stress : Not on file  Social Connections:   . Frequency of Communication with Friends and Family: Not on file  . Frequency of Social Gatherings with Friends and Family: Not on file  . Attends Religious Services: Not on file  . Active Member of Clubs or Organizations: Not on file  . Attends Archivist Meetings: Not on file  . Marital Status: Not on file  Intimate Partner Violence:   . Fear of Current or Ex-Partner: Not on file  . Emotionally Abused: Not on file  . Physically Abused: Not on file  . Sexually Abused: Not on file   Family History  Problem Relation Age of Onset  . Diabetes Mother   . Cancer  Mother     Current Outpatient Medications  Medication Sig Dispense Refill  . aspirin 81 MG EC tablet Take 1 tablet (81 mg total) by mouth daily. 90 tablet 1  . atorvastatin (LIPITOR) 10 MG tablet Take 1 tablet (10 mg total) by mouth daily. (Patient not taking: Reported on 06/16/2020) 90 tablet 1  . blood glucose meter kit and supplies Dispense based on patient and insurance preference. Use up to four times daily as directed. (FOR ICD-10 E10.9, E11.9). (Patient not taking: Reported on 06/16/2020) 1 each 0  . clopidogrel (PLAVIX) 75 MG tablet Take 1 tablet (75 mg total) by mouth daily. (Patient not taking: Reported on 11/17/2019) 30 tablet 6  . empagliflozin (JARDIANCE) 25 MG TABS tablet Take 25 mg by mouth daily before breakfast. (Patient not taking: Reported on 06/16/2020) 90 tablet 1  . gabapentin (NEURONTIN) 300 MG capsule Take 1 capsule (  300 mg total) by mouth 3 (three) times daily. 90 capsule 3  . Lancets MISC Inject 1 each into the skin 3 (three) times daily as needed. (Patient not taking: Reported on 06/16/2020) 100 each 3  . metFORMIN (GLUCOPHAGE) 500 MG tablet Take 2 tablets (1,000 mg total) by mouth 2 (two) times daily with a meal. i (Patient not taking: Reported on 06/16/2020) 360 tablet 1   No current facility-administered medications for this visit.    No Known Allergies   REVIEW OF SYSTEMS:   [X] denotes positive finding, [ ] denotes negative finding Cardiac  Comments:  Chest pain or chest pressure:    Shortness of breath upon exertion:    Short of breath when lying flat:    Irregular heart rhythm:        Vascular    Pain in calf, thigh, or hip brought on by ambulation:    Pain in feet at night that wakes you up from your sleep:     Blood clot in your veins:    Leg swelling:         Pulmonary    Oxygen at home:    Productive cough:     Wheezing:         Neurologic    Sudden weakness in arms or legs:     Sudden numbness in arms or legs:     Sudden onset of difficulty  speaking or slurred speech:    Temporary loss of vision in one eye:     Problems with dizziness:         Gastrointestinal    Blood in stool:     Vomited blood:         Genitourinary    Burning when urinating:     Blood in urine:        Psychiatric    Major depression:         Hematologic    Bleeding problems:    Problems with blood clotting too easily:        Skin    Rashes or ulcers:        Constitutional    Fever or chills:      PHYSICAL EXAMINATION:  Vitals:   06/23/20 1002  BP: 137/70  Pulse: 73  Resp: 20  Temp: 98.1 F (36.7 C)  SpO2: 100%    General:  WDWN in NAD; vital signs documented above Gait: Not observed HENT: WNL, normocephalic Pulmonary: normal non-labored breathing , without Rales, rhonchi,  wheezing Cardiac: regular HR, without  Murmurs without carotid bruit Abdomen: soft, NT, no masses Skin: without rashes Vascular Exam/Pulses: The patient has 2+ brachial and radial pulses bilaterally.  He has 1+ right femoral pulse and 2+ left femoral pulse.  I cannot palpate his popliteal or pedal pulses. Extremities: without ischemic changes, without Gangrene , without cellulitis; without open wounds; both feet are warm with intact motor function and sensation.  He has right knee deformity consistent with osteoarthritis. Musculoskeletal: no muscle wasting or atrophy  Neurologic: A&O X 3;  No focal weakness or paresthesias are detected Psychiatric:  The pt has Normal affect.   Aortogram dated 12/30/2018 by Dr. Donzetta Matters: Findings: There is may be a 30% stenosis of the distal aorta with similar into the left common iliac artery.  The right lower extremity SFA is patent has three-vessel runoff on the right.  Left SFA has multifocal areas of calcification distally there is a 90% stenosis.  After stenting and balloon dilatation there  is 0% residual stenosis with runoff to the foot via the posterior tibial is the dominant vessel and the peroneal artery also.  Anterior  tibial artery does not appear to make it to the foot.                Aortogram dated 11/11 2019 by Dr. Donzetta Matters: Findings: SFA on the right was heavily calcified and occluded for a short segment approximately 10 cm.  After atherectomy and plain balloon Angie plasty there was no further dissection so we placed a drug-coated balloon angioplasty at nominal pressure for 3 minutes and at completion there was no further dissection no stenosis with three-vessel runoff to the foot.  Non-Invasive Vascular Imaging:  06/23/2020 ABIs ABI/TBIToday's ABIToday's TBIPrevious ABIPrevious TBI  +-------+-----------+-----------+------------+------------+  Right 0.74    0.37    1.01    0.67      +-------+-----------+-----------+------------+------------+  Left  0.70    0.38    0.88    0.69      +-------+-----------+-----------+------------+------------+ Right great toe pressure equals 62.  PTA and DP waveforms are monophasic Left great toe pressure 65.  PTA waveforms are monophasic and DP waveforms are biphasic.  ASSESSMENT/PLAN:: 59 y.o. male here for follow up for bilateral peripheral arterial disease with right lower extremity pain.  History of right knee trauma chronic right knee pain.  He has decreased ABIs of a greater degree on the right. He has stopped taking his Plavix and statin and continues to smoke.  Has also stopped taking diabetic medication.  Had a lengthy discussion with the patient regarding arteriovascular disease, importance of smoking cessation and medication compliance.  We also discussed the impact long-term elevated glucose has on his artery health.  I explained his right lower extremity pain likely has 2 components: Osteoarthritis as well as ischemic in nature.  I recommend aortogram with right lower extremity runoff with possible intervention.  Even with this, he may find he continues to have right lower extremity pain and I encouraged him to seek an  orthopedic consultation for his right knee pain.  He verbalizes understanding and agrees to this plan.  We will place refills for Plavix and Lipitor.  Barbie Banner, PA-C [facility] [phone]  Clinic MD:   Donzetta Matters

## 2020-06-23 NOTE — H&P (View-Only) (Signed)
Office Note     CC:  follow up Requesting Provider:  Greene, Jeffrey R, MD  HPI: Stanley Chaney is a 59 y.o. (10/11/1960) male who presents for follow-up of peripheral arterial disease.  He is complaining of increasing right knee pain that radiates to his lower leg particularly upon rising in the morning.  He states the pain improves with walking.  He presents today using a rolling walker.  He does not describe classic rest pain at night.  He reports right knee fracture in the remote past with chronic pain.    The patient's wife became ill this year and unfortunately passed away.  The patient missed his follow-up appointment, is quit taking Plavix and Lipitor and continues to use tobacco.  Additionally, he does not take his medications any longer for diabetes.  He states he manages these conditions with diet.  He denies any lower extremity wounds at this time.  He had a left SFA Elluvia stent placed on December 30, 2018 by Dr. [name].  He had initially undergone drug coated balloon angioplasty and atherectomy of the left SFA in October of 2019. He is also s/patherectomyofright SFA and drug-coated balloon angioplasty of right SFAon 07-20-18.  He was last seen in our office on August 13, 2019 by Dr. [name].  His lower extremity wounds had all healed and he was asymptomatic.  He was compliant with his Plavix.  His right ABI at that time was 1 and his left was 0.8.  His toe pressures were approximately 100 bilaterally.  He appeared to have some focal disease at the origin of the left SFA with approximately 50 to 74% stenosis on his duplex ultrasound exam.  Arrangements were made for follow-up in 6 months with repeat ABIs.  He is compliant with aspirin only.  Not taking Plavix and statin. Review of laboratory data in epic chart reveals a hemoglobin A1c of 9.1 on November 17, 2019 Past Medical History:  Diagnosis Date  . Diabetes mellitus without complication (HCC)   . PAD (peripheral artery disease)  (HCC)     Past Surgical History:  Procedure Laterality Date  . ABDOMINAL AORTOGRAM W/LOWER EXTREMITY Bilateral 06/15/2018   Procedure: ABDOMINAL AORTOGRAM W/LOWER EXTREMITY;  Surgeon: [name], Brandon Christopher, MD;  Location: [facility];  Service: Cardiovascular;  Laterality: Bilateral;  . ABDOMINAL AORTOGRAM W/LOWER EXTREMITY Left 12/30/2018   Procedure: ABDOMINAL AORTOGRAM W/LOWER EXTREMITY;  Surgeon: [name], Brandon Christopher, MD;  Location: [facility];  Service: Cardiovascular;  Laterality: Left;  . I & D EXTREMITY Left 06/12/2018   Procedure: Excisional Debridement left foot;  Surgeon: Graves, John, MD;  Location: [facility];  Service: Orthopedics;  Laterality: Left;  . LOWER EXTREMITY ANGIOGRAM Right 07/20/2018     Right lower extremity angiogram  . LOWER EXTREMITY ANGIOGRAPHY Right 07/20/2018   Procedure: Lower Extremity Angiography;  Surgeon: [name], Brandon Christopher, MD;  Location: [facility];  Service: Cardiovascular;  Laterality: Right;  . PERIPHERAL VASCULAR ATHERECTOMY Left 06/15/2018   Procedure: PERIPHERAL VASCULAR ATHERECTOMY;  Surgeon: [name], Brandon Christopher, MD;  Location: [facility];  Service: Cardiovascular;  Laterality: Left;  SFA  . PERIPHERAL VASCULAR BALLOON ANGIOPLASTY Left 06/15/2018   Procedure: PERIPHERAL VASCULAR BALLOON ANGIOPLASTY;  Surgeon: [name], Brandon Christopher, MD;  Location: [facility];  Service: Cardiovascular;  Laterality: Left;  SFA  . PERIPHERAL VASCULAR BALLOON ANGIOPLASTY Right 07/20/2018   Procedure: PERIPHERAL VASCULAR BALLOON ANGIOPLASTY;  Surgeon: [name], Brandon Christopher, MD;  Location: [facility];    Service: Cardiovascular;  Laterality: Right;  SFA WITH DRUG COATED   . PERIPHERAL VASCULAR INTERVENTION Left 12/30/2018   Procedure: PERIPHERAL VASCULAR INTERVENTION;  Surgeon: [name], Brandon Christopher, MD;  Location: [facility];  Service: Cardiovascular;  Laterality: Left;  SFA    Social History    Socioeconomic History  . Marital status: Widowed    Spouse name: Not on file  . Number of children: Not on file  . Years of education: Not on file  . Highest education level: Not on file  Occupational History  . Not on file  Tobacco Use  . Smoking status: Former Smoker    Quit date: 06/14/2018    Years since quitting: 2.0  . Smokeless tobacco: Never Used  . Tobacco comment: pt reports quitting 6 months ago  Vaping Use  . Vaping Use: Never used  Substance and Sexual Activity  . Alcohol use: Yes    Alcohol/week: 18.0 standard drinks    Types: 18 Cans of beer per week    Comment: pt reports not drinking for past 6 months  . Drug use: Never  . Sexual activity: Not on file  Other Topics Concern  . Not on file  Social History Narrative  . Not on file   Social Determinants of Health   Financial Resource Strain:   . Difficulty of Paying Living Expenses: Not on file  Food Insecurity:   . Worried About Running Out of Food in the Last Year: Not on file  . Ran Out of Food in the Last Year: Not on file  Transportation Needs:   . Lack of Transportation (Medical): Not on file  . Lack of Transportation (Non-Medical): Not on file  Physical Activity:   . Days of Exercise per Week: Not on file  . Minutes of Exercise per Session: Not on file  Stress:   . Feeling of Stress : Not on file  Social Connections:   . Frequency of Communication with Friends and Family: Not on file  . Frequency of Social Gatherings with Friends and Family: Not on file  . Attends Religious Services: Not on file  . Active Member of Clubs or Organizations: Not on file  . Attends Club or Organization Meetings: Not on file  . Marital Status: Not on file  Intimate Partner Violence:   . Fear of Current or Ex-Partner: Not on file  . Emotionally Abused: Not on file  . Physically Abused: Not on file  . Sexually Abused: Not on file   Family History  Problem Relation Age of Onset  . Diabetes Mother   . Cancer  Mother     Current Outpatient Medications  Medication Sig Dispense Refill  . aspirin 81 MG EC tablet Take 1 tablet (81 mg total) by mouth daily. 90 tablet 1  . atorvastatin (LIPITOR) 10 MG tablet Take 1 tablet (10 mg total) by mouth daily. (Patient not taking: Reported on 06/16/2020) 90 tablet 1  . blood glucose meter kit and supplies Dispense based on patient and insurance preference. Use up to four times daily as directed. (FOR ICD-10 E10.9, E11.9). (Patient not taking: Reported on 06/16/2020) 1 each 0  . clopidogrel (PLAVIX) 75 MG tablet Take 1 tablet (75 mg total) by mouth daily. (Patient not taking: Reported on 11/17/2019) 30 tablet 6  . empagliflozin (JARDIANCE) 25 MG TABS tablet Take 25 mg by mouth daily before breakfast. (Patient not taking: Reported on 06/16/2020) 90 tablet 1  . gabapentin (NEURONTIN) 300 MG capsule Take 1 capsule (  300 mg total) by mouth 3 (three) times daily. 90 capsule 3  . Lancets MISC Inject 1 each into the skin 3 (three) times daily as needed. (Patient not taking: Reported on 06/16/2020) 100 each 3  . metFORMIN (GLUCOPHAGE) 500 MG tablet Take 2 tablets (1,000 mg total) by mouth 2 (two) times daily with a meal. i (Patient not taking: Reported on 06/16/2020) 360 tablet 1   No current facility-administered medications for this visit.    No Known Allergies   REVIEW OF SYSTEMS:   [X] denotes positive finding, [ ] denotes negative finding Cardiac  Comments:  Chest pain or chest pressure:    Shortness of breath upon exertion:    Short of breath when lying flat:    Irregular heart rhythm:        Vascular    Pain in calf, thigh, or hip brought on by ambulation:    Pain in feet at night that wakes you up from your sleep:     Blood clot in your veins:    Leg swelling:         Pulmonary    Oxygen at home:    Productive cough:     Wheezing:         Neurologic    Sudden weakness in arms or legs:     Sudden numbness in arms or legs:     Sudden onset of difficulty  speaking or slurred speech:    Temporary loss of vision in one eye:     Problems with dizziness:         Gastrointestinal    Blood in stool:     Vomited blood:         Genitourinary    Burning when urinating:     Blood in urine:        Psychiatric    Major depression:         Hematologic    Bleeding problems:    Problems with blood clotting too easily:        Skin    Rashes or ulcers:        Constitutional    Fever or chills:      PHYSICAL EXAMINATION:  Vitals:   06/23/20 1002  BP: 137/70  Pulse: 73  Resp: 20  Temp: 98.1 F (36.7 C)  SpO2: 100%    General:  WDWN in NAD; vital signs documented above Gait: Not observed HENT: WNL, normocephalic Pulmonary: normal non-labored breathing , without Rales, rhonchi,  wheezing Cardiac: regular HR, without  Murmurs without carotid bruit Abdomen: soft, NT, no masses Skin: without rashes Vascular Exam/Pulses: The patient has 2+ brachial and radial pulses bilaterally.  He has 1+ right femoral pulse and 2+ left femoral pulse.  I cannot palpate his popliteal or pedal pulses. Extremities: without ischemic changes, without Gangrene , without cellulitis; without open wounds; both feet are warm with intact motor function and sensation.  He has right knee deformity consistent with osteoarthritis. Musculoskeletal: no muscle wasting or atrophy  Neurologic: A&O X 3;  No focal weakness or paresthesias are detected Psychiatric:  The pt has Normal affect.   Aortogram dated 12/30/2018 by Dr. [name]: Findings: There is may be a 30% stenosis of the distal aorta with similar into the left common iliac artery.  The right lower extremity SFA is patent has three-vessel runoff on the right.  Left SFA has multifocal areas of calcification distally there is a 90% stenosis.  After stenting and balloon dilatation there   is 0% residual stenosis with runoff to the foot via the posterior tibial is the dominant vessel and the peroneal artery also.  Anterior  tibial artery does not appear to make it to the foot.                Aortogram dated 11/11 2019 by Dr. [name]: Findings: SFA on the right was heavily calcified and occluded for a short segment approximately 10 cm.  After atherectomy and plain balloon Angie plasty there was no further dissection so we placed a drug-coated balloon angioplasty at nominal pressure for 3 minutes and at completion there was no further dissection no stenosis with three-vessel runoff to the foot.  Non-Invasive Vascular Imaging:  06/23/2020 ABIs ABI/TBIToday's ABIToday's TBIPrevious ABIPrevious TBI  +-------+-----------+-----------+------------+------------+  Right 0.74    0.37    1.01    0.67      +-------+-----------+-----------+------------+------------+  Left  0.70    0.38    0.88    0.69      +-------+-----------+-----------+------------+------------+ Right great toe pressure equals 62.  PTA and DP waveforms are monophasic Left great toe pressure 65.  PTA waveforms are monophasic and DP waveforms are biphasic.  ASSESSMENT/PLAN:: 59 y.o. male here for follow up for bilateral peripheral arterial disease with right lower extremity pain.  History of right knee trauma chronic right knee pain.  He has decreased ABIs of a greater degree on the right. He has stopped taking his Plavix and statin and continues to smoke.  Has also stopped taking diabetic medication.  Had a lengthy discussion with the patient regarding arteriovascular disease, importance of smoking cessation and medication compliance.  We also discussed the impact long-term elevated glucose has on his artery health.  I explained his right lower extremity pain likely has 2 components: Osteoarthritis as well as ischemic in nature.  I recommend aortogram with right lower extremity runoff with possible intervention.  Even with this, he may find he continues to have right lower extremity pain and I encouraged him to seek an  orthopedic consultation for his right knee pain.  He verbalizes understanding and agrees to this plan.  We will place refills for Plavix and Lipitor.  Carely Nappier J Amri Lien, PA-C [facility] [phone]  Clinic MD:   [name] 

## 2020-07-07 ENCOUNTER — Other Ambulatory Visit (HOSPITAL_COMMUNITY)
Admission: RE | Admit: 2020-07-07 | Discharge: 2020-07-07 | Disposition: A | Discharge status: Home / Self Care | Payer: HRSA Program | Source: Ambulatory Visit | Attending: Vascular Surgery | Admitting: Vascular Surgery

## 2020-07-07 DIAGNOSIS — Z01812 Encounter for preprocedural laboratory examination: Secondary | ICD-10-CM | POA: Insufficient documentation

## 2020-07-07 DIAGNOSIS — Z20822 Contact with and (suspected) exposure to covid-19: Secondary | ICD-10-CM | POA: Diagnosis not present

## 2020-07-08 LAB — SARS CORONAVIRUS 2 (TAT 6-24 HRS): SARS Coronavirus 2: NEGATIVE

## 2020-07-10 ENCOUNTER — Ambulatory Visit (HOSPITAL_COMMUNITY)
Admission: RE | Admit: 2020-07-10 | Discharge: 2020-07-10 | Disposition: A | Discharge status: Home / Self Care | Payer: Self-pay | Attending: Vascular Surgery | Admitting: Vascular Surgery

## 2020-07-10 ENCOUNTER — Other Ambulatory Visit: Payer: Self-pay

## 2020-07-10 ENCOUNTER — Encounter (HOSPITAL_COMMUNITY)
Admission: RE | Disposition: A | Discharge status: Home / Self Care | Payer: Self-pay | Source: Home / Self Care | Attending: Vascular Surgery

## 2020-07-10 ENCOUNTER — Encounter (HOSPITAL_COMMUNITY): Payer: Self-pay | Admitting: Vascular Surgery

## 2020-07-10 DIAGNOSIS — Z87891 Personal history of nicotine dependence: Secondary | ICD-10-CM | POA: Insufficient documentation

## 2020-07-10 DIAGNOSIS — I70221 Atherosclerosis of native arteries of extremities with rest pain, right leg: Secondary | ICD-10-CM

## 2020-07-10 DIAGNOSIS — G8929 Other chronic pain: Secondary | ICD-10-CM | POA: Insufficient documentation

## 2020-07-10 DIAGNOSIS — M25561 Pain in right knee: Secondary | ICD-10-CM | POA: Insufficient documentation

## 2020-07-10 DIAGNOSIS — E1151 Type 2 diabetes mellitus with diabetic peripheral angiopathy without gangrene: Secondary | ICD-10-CM | POA: Insufficient documentation

## 2020-07-10 DIAGNOSIS — I70223 Atherosclerosis of native arteries of extremities with rest pain, bilateral legs: Secondary | ICD-10-CM | POA: Insufficient documentation

## 2020-07-10 HISTORY — PX: ABDOMINAL AORTOGRAM W/LOWER EXTREMITY: CATH118223

## 2020-07-10 LAB — POCT I-STAT, CHEM 8
BUN: 16 mg/dL (ref 8–23)
Calcium, Ion: 1.25 mmol/L (ref 1.15–1.40)
Chloride: 97 mmol/L — ABNORMAL LOW (ref 98–111)
Creatinine, Ser: 0.9 mg/dL (ref 0.61–1.24)
Glucose, Bld: 172 mg/dL — ABNORMAL HIGH (ref 70–99)
HCT: 45 % (ref 39.0–52.0)
Hemoglobin: 15.3 g/dL (ref 13.0–17.0)
Potassium: 4.3 mmol/L (ref 3.5–5.1)
Sodium: 139 mmol/L (ref 135–145)
TCO2: 30 mmol/L (ref 22–32)

## 2020-07-10 LAB — GLUCOSE, CAPILLARY: Glucose-Capillary: 157 mg/dL — ABNORMAL HIGH (ref 70–99)

## 2020-07-10 SURGERY — ABDOMINAL AORTOGRAM W/LOWER EXTREMITY
Anesthesia: LOCAL

## 2020-07-10 MED ORDER — HEPARIN (PORCINE) IN NACL 1000-0.9 UT/500ML-% IV SOLN
INTRAVENOUS | Status: DC | PRN
  Administered 2020-07-10: 500 mL

## 2020-07-10 MED ORDER — SODIUM CHLORIDE 0.9% FLUSH: 3.0000 mL | Freq: Two times a day (BID) | INTRAVENOUS | Status: DC

## 2020-07-10 MED ORDER — ACETAMINOPHEN 325 MG PO TABS: 650.0000 mg | ORAL_TABLET | ORAL | Status: DC | PRN

## 2020-07-10 MED ORDER — LIDOCAINE HCL (PF) 1 % IJ SOLN
INTRAMUSCULAR | Status: DC | PRN
  Administered 2020-07-10: 12 mL

## 2020-07-10 MED ORDER — SODIUM CHLORIDE 0.9 % IV SOLN: 250.0000 mL | INTRAVENOUS | Status: DC | PRN

## 2020-07-10 MED ORDER — ONDANSETRON HCL 4 MG/2ML IJ SOLN: 4.0000 mg | Freq: Four times a day (QID) | INTRAMUSCULAR | Status: DC | PRN

## 2020-07-10 MED ORDER — SODIUM CHLORIDE 0.9 % IV SOLN: INTRAVENOUS | Status: DC

## 2020-07-10 MED ORDER — SODIUM CHLORIDE 0.9% FLUSH: 3.0000 mL | INTRAVENOUS | Status: DC | PRN

## 2020-07-10 MED ORDER — MIDAZOLAM HCL 2 MG/2ML IJ SOLN
INTRAMUSCULAR | Status: DC | PRN
  Administered 2020-07-10: 1 mg via INTRAVENOUS

## 2020-07-10 MED ORDER — FENTANYL CITRATE (PF) 100 MCG/2ML IJ SOLN
INTRAMUSCULAR | Status: DC | PRN
  Administered 2020-07-10: 50 ug via INTRAVENOUS

## 2020-07-10 MED ORDER — LABETALOL HCL 5 MG/ML IV SOLN: 10.0000 mg | INTRAVENOUS | Status: DC | PRN

## 2020-07-10 MED ORDER — OXYCODONE HCL 5 MG PO TABS: 5.0000 mg | ORAL_TABLET | ORAL | Status: DC | PRN

## 2020-07-10 MED ORDER — FENTANYL CITRATE (PF) 100 MCG/2ML IJ SOLN
INTRAMUSCULAR | Status: AC
  Filled 2020-07-10: qty 2

## 2020-07-10 MED ORDER — LIDOCAINE HCL (PF) 1 % IJ SOLN
INTRAMUSCULAR | Status: AC
  Filled 2020-07-10: qty 30

## 2020-07-10 MED ORDER — MORPHINE SULFATE (PF) 2 MG/ML IV SOLN: 2.0000 mg | INTRAVENOUS | Status: DC | PRN

## 2020-07-10 MED ORDER — MIDAZOLAM HCL 2 MG/2ML IJ SOLN
INTRAMUSCULAR | Status: AC
  Filled 2020-07-10: qty 2

## 2020-07-10 MED ORDER — HYDRALAZINE HCL 20 MG/ML IJ SOLN: 5.0000 mg | INTRAMUSCULAR | Status: DC | PRN

## 2020-07-10 SURGICAL SUPPLY — 9 items
CATH OMNI FLUSH 5F 65CM (CATHETERS) ×1 IMPLANT
KIT MICROPUNCTURE NIT STIFF (SHEATH) ×1 IMPLANT
KIT PV (KITS) ×2 IMPLANT
SHEATH PINNACLE 5F 10CM (SHEATH) ×1 IMPLANT
SHEATH PROBE COVER 6X72 (BAG) ×1 IMPLANT
SYR MEDRAD MARK V 150ML (SYRINGE) ×1 IMPLANT
TRANSDUCER W/STOPCOCK (MISCELLANEOUS) ×2 IMPLANT
TRAY PV CATH (CUSTOM PROCEDURE TRAY) ×2 IMPLANT
WIRE BENTSON .035X145CM (WIRE) ×1 IMPLANT

## 2020-07-10 NOTE — Progress Notes (Addendum)
Attempted to review d/c instructions with pt. He states he has done this now 4 times and knows what to do, does not want me to review them. I did informed him if he were to bleed to hold pressure and call [phone] if it doesn't stop. Pt refused instructions  Interpreter at bedside states that pt son will review written instructions.

## 2020-07-10 NOTE — Op Note (Signed)
    Patient name: Stanley Chaney MRN: [id number] DOB: 1961-07-24 Sex: male  07/10/2020 Pre-operative Diagnosis: Right lower extremity pain with peripheral arterial disease Post-operative diagnosis:  Same Surgeon:  Eda Paschal. Donzetta Matters, MD Procedure Performed: 1.  Ultrasound-guided cannulation left common femoral artery 2.  Aortogram with bilateral lower extremity runoff 3.  Moderate sedation with fentanyl and Versed for 18 minutes  Indications: 59 year old male has previously undergone bilateral lower extremity SFA intervention.  On the right side he had drug-coated balloon angioplasty.  On the left side he had drug-coated balloon angioplasty followed by subsequent stenting.  He now has right lower extremity pain he is indicated for angiography with possible intervention.  Findings:  the aorta and iliac segments are free of flow-limiting stenosis.  Bilateral renal arteries are patent.  Bilateral external iliac arteries are diminutive however no flow-limiting stenosis.  The right side which is the site of interest has recurrent disease approximately 50% stenosis in the SFA but is nonflow limiting.  Single-vessel runoff in line via the anterior tibial artery to the foot.  TP trunk is disease he then has posterior tibial artery running to the foot.  On the left side appears to have 3 vessels the posterior tibial is the dominant.  No intervention was undertaken.   Procedure:  The patient was identified in the holding area and taken to room 8.  The patient was then placed supine on the table and prepped and draped in the usual sterile fashion.  A time out was called.  Ultrasound was used to evaluate the left common femoral artery.  There was some disease.  The area was anesthetized 1% lidocaine cannulated micropuncture needle followed wire and sheath.  And images saved the permanent record.  Moderate sedation was administered with fentanyl and Versed and his vital signs were monitored throughout the course of  the case.  Bentson wires placed followed 5 French sheath.  Omni catheter was placed to the level of L1.  Aortogram was performed followed by lower extremity runoff.  With the above findings we elected not to intervene.  The catheter was removed over wire.  Sheath will be removed in postoperative holding.  He tolerated procedure well without any complication.  Contrast: 97cc    Xenia Nile C. Donzetta Matters, MD [facility] [place] Office: [phone] Pager: [phone]

## 2020-07-10 NOTE — Discharge Instructions (Signed)

## 2020-07-10 NOTE — Interval H&P Note (Signed)
History and Physical Interval Note:  07/10/2020 10:11 AM  Stanley Chaney  has presented today for surgery, with the diagnosis of peripheral vascular disease.  The various methods of treatment have been discussed with the patient and family. After consideration of risks, benefits and other options for treatment, the patient has consented to  Procedure(s): ABDOMINAL AORTOGRAM W/LOWER EXTREMITY (N/A) as a surgical intervention.  The patient's history has been reviewed, patient examined, no change in status, stable for surgery.  I have reviewed the patient's chart and labs.  Questions were answered to the patient's satisfaction.     Servando Snare

## 2020-07-10 NOTE — Progress Notes (Signed)
Spoke with Dr Donzetta Matters about Plavix. Dr Donzetta Matters states the pt told him that he will not take plavix, but he will need to take aspirin every day. Pt informed

## 2020-07-10 NOTE — Progress Notes (Signed)
Discharge instructions reviewed with pt son (via telephone) voices understanding.

## 2020-07-10 NOTE — Progress Notes (Signed)
Site area: left groin arterial site Site Prior to Removal:  Level 0 Pressure Applied For: 25 minutes Manual:   yes Patient Status During Pull:  stable Post Pull Site:  Level 0 Post Pull Instructions Given:  Yes with translator Post Pull Pulses Present: left dp dopplered Dressing Applied:  Gauze and tegaderm Bedrest begins @ 5449 Comments:

## 2020-07-12 NOTE — Telephone Encounter (Signed)
Called son and said his right knee pain has gotten worse. It is swollen to a size of a grapefruit.   Please advise if the pended referral is appropriate.   Son did report he did have a procedure done to his veins and it went well.

## 2020-07-12 NOTE — Addendum Note (Signed)
Addended by: Kittie Plater, Meegan Shanafelt HUA on: 07/12/2020 04:06 PM   Modules accepted: Orders

## 2020-07-12 NOTE — Telephone Encounter (Signed)
Son calling back because he never heard back from the dr concerning pt's knee. Son states it is still really swollen, painful, and he can barely walk on it. Would like urgent referral to ortho.  cb  [phone]

## 2020-07-17 ENCOUNTER — Other Ambulatory Visit: Payer: Self-pay

## 2020-07-17 ENCOUNTER — Inpatient Hospital Stay (HOSPITAL_COMMUNITY)
Admission: EM | Admit: 2020-07-17 | Discharge: 2020-07-21 | DRG: 486 | Disposition: A | Discharge status: Home Health | Payer: Self-pay | Attending: Orthopedic Surgery | Admitting: Orthopedic Surgery

## 2020-07-17 ENCOUNTER — Emergency Department (HOSPITAL_COMMUNITY): Payer: Self-pay

## 2020-07-17 DIAGNOSIS — R64 Cachexia: Secondary | ICD-10-CM | POA: Diagnosis present

## 2020-07-17 DIAGNOSIS — E119 Type 2 diabetes mellitus without complications: Secondary | ICD-10-CM

## 2020-07-17 DIAGNOSIS — M869 Osteomyelitis, unspecified: Secondary | ICD-10-CM | POA: Diagnosis present

## 2020-07-17 DIAGNOSIS — E1151 Type 2 diabetes mellitus with diabetic peripheral angiopathy without gangrene: Secondary | ICD-10-CM | POA: Diagnosis present

## 2020-07-17 DIAGNOSIS — Z833 Family history of diabetes mellitus: Secondary | ICD-10-CM

## 2020-07-17 DIAGNOSIS — R768 Other specified abnormal immunological findings in serum: Secondary | ICD-10-CM | POA: Diagnosis present

## 2020-07-17 DIAGNOSIS — D62 Acute posthemorrhagic anemia: Secondary | ICD-10-CM | POA: Diagnosis not present

## 2020-07-17 DIAGNOSIS — M65161 Other infective (teno)synovitis, right knee: Secondary | ICD-10-CM | POA: Diagnosis present

## 2020-07-17 DIAGNOSIS — I10 Essential (primary) hypertension: Secondary | ICD-10-CM | POA: Diagnosis present

## 2020-07-17 DIAGNOSIS — Z7902 Long term (current) use of antithrombotics/antiplatelets: Secondary | ICD-10-CM

## 2020-07-17 DIAGNOSIS — Z20822 Contact with and (suspected) exposure to covid-19: Secondary | ICD-10-CM | POA: Diagnosis present

## 2020-07-17 DIAGNOSIS — M868X8 Other osteomyelitis, other site: Secondary | ICD-10-CM | POA: Diagnosis present

## 2020-07-17 DIAGNOSIS — B965 Pseudomonas (aeruginosa) (mallei) (pseudomallei) as the cause of diseases classified elsewhere: Secondary | ICD-10-CM | POA: Diagnosis present

## 2020-07-17 DIAGNOSIS — M25569 Pain in unspecified knee: Secondary | ICD-10-CM

## 2020-07-17 DIAGNOSIS — Z6820 Body mass index (BMI) 20.0-20.9, adult: Secondary | ICD-10-CM

## 2020-07-17 DIAGNOSIS — Z7982 Long term (current) use of aspirin: Secondary | ICD-10-CM

## 2020-07-17 DIAGNOSIS — E1169 Type 2 diabetes mellitus with other specified complication: Secondary | ICD-10-CM | POA: Diagnosis present

## 2020-07-17 DIAGNOSIS — M1711 Unilateral primary osteoarthritis, right knee: Secondary | ICD-10-CM | POA: Diagnosis present

## 2020-07-17 DIAGNOSIS — E440 Moderate protein-calorie malnutrition: Secondary | ICD-10-CM | POA: Diagnosis present

## 2020-07-17 DIAGNOSIS — M00861 Arthritis due to other bacteria, right knee: Principal | ICD-10-CM | POA: Diagnosis present

## 2020-07-17 DIAGNOSIS — Z87891 Personal history of nicotine dependence: Secondary | ICD-10-CM

## 2020-07-17 DIAGNOSIS — E875 Hyperkalemia: Secondary | ICD-10-CM | POA: Diagnosis present

## 2020-07-17 DIAGNOSIS — Z7984 Long term (current) use of oral hypoglycemic drugs: Secondary | ICD-10-CM

## 2020-07-17 DIAGNOSIS — E559 Vitamin D deficiency, unspecified: Secondary | ICD-10-CM | POA: Diagnosis present

## 2020-07-17 DIAGNOSIS — M6284 Sarcopenia: Secondary | ICD-10-CM | POA: Diagnosis present

## 2020-07-17 DIAGNOSIS — M009 Pyogenic arthritis, unspecified: Secondary | ICD-10-CM | POA: Diagnosis present

## 2020-07-17 DIAGNOSIS — Z79899 Other long term (current) drug therapy: Secondary | ICD-10-CM

## 2020-07-17 DIAGNOSIS — G8929 Other chronic pain: Secondary | ICD-10-CM | POA: Diagnosis present

## 2020-07-17 DIAGNOSIS — E8889 Other specified metabolic disorders: Secondary | ICD-10-CM | POA: Diagnosis present

## 2020-07-17 LAB — CBC WITH DIFFERENTIAL/PLATELET
Abs Immature Granulocytes: 0.06 10*3/uL (ref 0.00–0.07)
Basophils Absolute: 0 10*3/uL (ref 0.0–0.1)
Basophils Relative: 1 %
Eosinophils Absolute: 0.1 10*3/uL (ref 0.0–0.5)
Eosinophils Relative: 2 %
HCT: 41.6 % (ref 39.0–52.0)
Hemoglobin: 13.1 g/dL (ref 13.0–17.0)
Immature Granulocytes: 1 %
Lymphocytes Relative: 20 %
Lymphs Abs: 1.8 10*3/uL (ref 0.7–4.0)
MCH: 28.1 pg (ref 26.0–34.0)
MCHC: 31.5 g/dL (ref 30.0–36.0)
MCV: 89.1 fL (ref 80.0–100.0)
Monocytes Absolute: 1.2 10*3/uL — ABNORMAL HIGH (ref 0.1–1.0)
Monocytes Relative: 14 %
Neutro Abs: 5.6 10*3/uL (ref 1.7–7.7)
Neutrophils Relative %: 62 %
Platelets: 330 10*3/uL (ref 150–400)
RBC: 4.67 MIL/uL (ref 4.22–5.81)
RDW: 11.9 % (ref 11.5–15.5)
WBC: 8.8 10*3/uL (ref 4.0–10.5)
nRBC: 0 % (ref 0.0–0.2)

## 2020-07-17 LAB — COMPREHENSIVE METABOLIC PANEL
ALT: 17 U/L (ref 0–44)
AST: 21 U/L (ref 15–41)
Albumin: 2.9 g/dL — ABNORMAL LOW (ref 3.5–5.0)
Alkaline Phosphatase: 90 U/L (ref 38–126)
Anion gap: 14 (ref 5–15)
BUN: 12 mg/dL (ref 8–23)
CO2: 28 mmol/L (ref 22–32)
Calcium: 10.1 mg/dL (ref 8.9–10.3)
Chloride: 100 mmol/L (ref 98–111)
Creatinine, Ser: 1 mg/dL (ref 0.61–1.24)
GFR, Estimated: >60 mL/min (ref >60)
Glucose, Bld: 180 mg/dL — ABNORMAL HIGH (ref 70–99)
Potassium: 5.3 mmol/L — ABNORMAL HIGH (ref 3.5–5.1)
Sodium: 142 mmol/L (ref 135–145)
Total Bilirubin: 0.8 mg/dL (ref 0.3–1.2)
Total Protein: 7.9 g/dL (ref 6.5–8.1)

## 2020-07-17 LAB — SYNOVIAL CELL COUNT + DIFF, W/ CRYSTALS
Crystals, Fluid: NONE SEEN
Eosinophils-Synovial: 0 % (ref 0–1)
Lymphocytes-Synovial Fld: 1 % (ref 0–20)
Monocyte-Macrophage-Synovial Fluid: 2 % — ABNORMAL LOW (ref 50–90)
Neutrophil, Synovial: 97 % — ABNORMAL HIGH (ref 0–25)
WBC, Synovial: 20350 /mm3 — ABNORMAL HIGH (ref 0–200)

## 2020-07-17 LAB — LACTIC ACID, PLASMA: Lactic Acid, Venous: 1.8 mmol/L (ref 0.5–1.9)

## 2020-07-17 LAB — SEDIMENTATION RATE: Sed Rate: 111 mm/hr — ABNORMAL HIGH (ref 0–16)

## 2020-07-17 LAB — C-REACTIVE PROTEIN: CRP: 20.5 mg/dL — ABNORMAL HIGH (ref <1.0)

## 2020-07-17 MED ORDER — LIDOCAINE HCL (PF) 1 % IJ SOLN
30.0000 mL | Freq: Once | INTRAMUSCULAR | Status: AC
  Administered 2020-07-17: 30 mL
  Filled 2020-07-17: qty 30

## 2020-07-17 MED ORDER — OXYCODONE-ACETAMINOPHEN 5-325 MG PO TABS
2.0000 | ORAL_TABLET | Freq: Once | ORAL | Status: AC
  Administered 2020-07-17: 2 via ORAL
  Filled 2020-07-17: qty 2

## 2020-07-17 MED ORDER — KETOROLAC TROMETHAMINE 15 MG/ML IJ SOLN
15.0000 mg | Freq: Three times a day (TID) | INTRAMUSCULAR | Status: DC
  Administered 2020-07-18 – 2020-07-21 (×10): 15 mg via INTRAVENOUS
  Filled 2020-07-17 (×10): qty 1

## 2020-07-17 NOTE — Consult Note (Signed)
[facility]th[facility]edic [facility]uma Service Consultation  Reason for Consult: Right knee pain Referring Physician: Gareth Morgan, MD  Stanley Chaney is an 59 y.o. male.  HPI: C/o 4 weeks of pain requiring walker but so bad today had to come in. Denies fever, chills, malaise. Is on pain patch Fenatnyl but unable to explain why. PVD, h/o contralat foot cellulitis.  Past Medical History:  Diagnosis Date  . Diabetes mellitus without complication ([place])   . PAD (peripheral artery disease) ([place])     Past Surgical History:  Procedure Laterality Date  . ABDOMINAL AORTOGRAM W/LOWER EXTREMITY Bilateral 06/15/2018   Procedure: ABDOMINAL AORTOGRAM W/LOWER EXTREMITY;  Surgeon: [name], MD;  Location: [facility];  Service: Cardiovascular;  Laterality: Bilateral;  . ABDOMINAL AORTOGRAM W/LOWER EXTREMITY Left 12/30/2018   Procedure: ABDOMINAL AORTOGRAM W/LOWER EXTREMITY;  Surgeon: [name], MD;  Location: [facility];  Service: Cardiovascular;  Laterality: Left;  . ABDOMINAL AORTOGRAM W/LOWER EXTREMITY N/A 07/10/2020   Procedure: ABDOMINAL AORTOGRAM W/LOWER EXTREMITY;  Surgeon: [name], MD;  Location: [facility];  Service: Cardiovascular;  Laterality: N/A;  Bilateral  . I & D EXTREMITY Left 06/12/2018   Procedure: Excisional Debridement left foot;  Surgeon: [name], MD;  Location: [facility];  Service: Orthopedics;  Laterality: Left;  . LOWER EXTREMITY ANGIOGRAM Right 07/20/2018     Right lower extremity angiogram  . LOWER EXTREMITY ANGIOGRAPHY Right 07/20/2018   Procedure: Lower Extremity Angiography;  Surgeon: [name], MD;  Location: [facility];  Service: Cardiovascular;  Laterality: Right;  . PERIPHERAL VASCULAR ATHERECTOMY Left 06/15/2018   Procedure: PERIPHERAL VASCULAR ATHERECTOMY;  Surgeon: [name], MD;  Location: [facility];  Service: Cardiovascular;  Laterality: Left;  SFA  .  PERIPHERAL VASCULAR BALLOON ANGIOPLASTY Left 06/15/2018   Procedure: PERIPHERAL VASCULAR BALLOON ANGIOPLASTY;  Surgeon: [name], MD;  Location: [facility];  Service: Cardiovascular;  Laterality: Left;  SFA  . PERIPHERAL VASCULAR BALLOON ANGIOPLASTY Right 07/20/2018   Procedure: PERIPHERAL VASCULAR BALLOON ANGIOPLASTY;  Surgeon: [name], MD;  Location: [facility];  Service: Cardiovascular;  Laterality: Right;  SFA WITH DRUG COATED   . PERIPHERAL VASCULAR INTERVENTION Left 12/30/2018   Procedure: PERIPHERAL VASCULAR INTERVENTION;  Surgeon: [name], MD;  Location: [facility];  Service: Cardiovascular;  Laterality: Left;  SFA    Family History  Problem Relation Age of Onset  . Diabetes Mother   . Cancer Mother     Social History:  reports that he quit smoking about 2 years ago. He has never used smokeless tobacco. He reports current alcohol use of about 18.0 standard drinks of alcohol per week. He reports that he does not use drugs.  Allergies: No Known Allergies  Medications: Prior to Admission: Metformin, no insulin; see list in chart nearby. Reviewed.   Results for orders placed or performed during the hospital encounter of 07/17/20 (from the past 48 hour(s))  Comprehensive metabolic panel     Status: Abnormal   Collection Time: 07/17/20  4:29 PM  Result Value Ref Range   Sodium 142 135 - 145 mmol/L   Potassium 5.3 (H) 3.5 - 5.1 mmol/L   Chloride 100 98 - 111 mmol/L   CO2 28 22 - 32 mmol/L   Glucose, Bld 180 (H) 70 - 99 mg/dL    Comment: Glucose reference range applies only to samples taken after fasting for at least 8 hours.  BUN 12 8 - 23 mg/dL   Creatinine, Ser 1.00 0.61 - 1.24 mg/dL   Calcium 10.1 8.9 - 10.3 mg/dL   Total Protein 7.9 6.5 - 8.1 g/dL   Albumin 2.9 (L) 3.5 - 5.0 g/dL   AST 21 15 - 41 U/L   ALT 17 0 - 44 U/L   Alkaline Phosphatase 90 38 - 126 U/L   Total Bilirubin 0.8 0.3 - 1.2 mg/dL    GFR, Estimated >60 >60 mL/min    Comment: (NOTE) Calculated using the CKD-EPI Creatinine Equation (2021)    Anion gap 14 5 - 15    Comment: Performed at [facility] [address], [place], [place] [address]  CBC with Differential     Status: Abnormal   Collection Time: 07/17/20  4:29 PM  Result Value Ref Range   WBC 8.8 4.0 - 10.5 K/uL   RBC 4.67 4.22 - 5.81 MIL/uL   Hemoglobin 13.1 13.0 - 17.0 g/dL   HCT 41.6 39 - 52 %   MCV 89.1 80.0 - 100.0 fL   MCH 28.1 26.0 - 34.0 pg   MCHC 31.5 30.0 - 36.0 g/dL   RDW 11.9 11.5 - 15.5 %   Platelets 330 150 - 400 K/uL   nRBC 0.0 0.0 - 0.2 %   Neutrophils Relative % 62 %   Neutro Abs 5.6 1.7 - 7.7 K/uL   Lymphocytes Relative 20 %   Lymphs Abs 1.8 0.7 - 4.0 K/uL   Monocytes Relative 14 %   Monocytes Absolute 1.2 (H) 0.1 - 1.0 K/uL   Eosinophils Relative 2 %   Eosinophils Absolute 0.1 0.0 - 0.5 K/uL   Basophils Relative 1 %   Basophils Absolute 0.0 0.0 - 0.1 K/uL   Immature Granulocytes 1 %   Abs Immature Granulocytes 0.06 0.00 - 0.07 K/uL    Comment: Performed at [facility] [address], [place], [place] [address]  C-reactive protein     Status: Abnormal   Collection Time: 07/17/20  6:00 PM  Result Value Ref Range   CRP 20.5 (H) <1.0 mg/dL    Comment: Performed at [facility] [address], [place], [place] [address]  Sedimentation rate     Status: Abnormal   Collection Time: 07/17/20  6:00 PM  Result Value Ref Range   Sed Rate 111 (H) 0 - 16 mm/hr    Comment: Performed at [facility] [address], [place], [place] [address]  Lactic acid, plasma     Status: None   Collection Time: 07/17/20  6:19 PM  Result Value Ref Range   Lactic Acid, Venous 1.8 0.5 - 1.9 mmol/L    Comment: Performed at [facility] [address] [place], [place] [address]  Body fluid culture     Status: None (Preliminary result)   Collection Time: 07/17/20  7:31 PM   Specimen: Synovium; Body Fluid  Result Value Ref  Range   Specimen Description SYNOVIAL FLUID KNEE    Special Requests NONE    Gram Stain      FEW WBC PRESENT,BOTH PMN AND MONONUCLEAR NO ORGANISMS SEEN Performed at [facility], [place] [address], [place], [place] [address]    Culture PENDING    Report Status PENDING   Synovial cell count + diff, w/ crystals     Status: Abnormal   Collection Time: 07/17/20  7:31 PM  Result Value Ref Range   Color, Synovial RED (  A) YELLOW   Appearance-Synovial CLOUDY (A) CLEAR   Crystals, Fluid NO CRYSTALS SEEN    WBC, Synovial 20,350 (H) 0 - 200 /cu mm   Neutrophil, Synovial 97 (H) 0 - 25 %   Lymphocytes-Synovial Fld 1 0 - 20 %   Monocyte-Macrophage-Synovial Fluid 2 (L) 50 - 90 %   Eosinophils-Synovial 0 0 - 1 %    Comment: Performed at [facility] [address] [place], [place] [address]    DG Chest 2 View  Result Date: 07/17/2020 CLINICAL DATA:  Chest pain, sepsis EXAM: CHEST - 2 VIEW COMPARISON:  11/12/2018 FINDINGS: Frontal and lateral views of the chest demonstrate an unremarkable cardiac silhouette. No airspace disease, effusion, or pneumothorax. No acute bony abnormalities. IMPRESSION: 1. No acute intrathoracic process. Electronically Signed   By: Randa Ngo M.D.   On: 07/17/2020 17:01   DG Knee Complete 4 Views Left  Result Date: 07/17/2020 CLINICAL DATA:  59 year old male with right knee pain. EXAM: LEFT KNEE - COMPLETE 4+ VIEW COMPARISON:  None. FINDINGS: There is no acute fracture or dislocation. The bones are osteopenic. There is focal area of demineralization along the lateral aspect of the lateral tibial plateau which although may be related to osteopenia is concerning for osteomyelitis. Clinical correlation recommended. There is a moderate size suprapatellar effusion with pockets of air. There is mild skin thickening along the anterior knee. Vascular calcifications noted. IMPRESSION: 1. No acute fracture or dislocation. 2. Moderate suprapatellar effusion with pockets of  air concerning for an infectious process. Further evaluation with MRI or joint aspiration recommended. 3. Focal area of demineralization along the lateral aspect of the lateral tibial plateau concerning for osteomyelitis. Electronically Signed   By: Anner Crete M.D.   On: 07/17/2020 16:14   DG Knee Complete 4 Views Right  Result Date: 07/17/2020 CLINICAL DATA:  59 year old male with right knee pain. EXAM: RIGHT KNEE - COMPLETE 4+ VIEW COMPARISON:  None. FINDINGS: There is no acute fracture or dislocation. The bones are osteopenic. There is focal area of demineralization along the lateral aspect of the lateral tibial plateau which although may be related to osteopenia is concerning for osteomyelitis. Clinical correlation recommended. There is a moderate size suprapatellar effusion with pockets of air. There is mild skin thickening along the anterior knee. Vascular calcifications noted. IMPRESSION: 1. No acute fracture or dislocation. 2. Moderate suprapatellar effusion with pockets of air concerning for an infectious process. Further evaluation with MRI or joint aspiration recommended. 3. Focal area of demineralization along the lateral aspect of the lateral tibial plateau concerning for osteomyelitis. Electronically Signed   By: Anner Crete M.D.   On: 07/17/2020 16:14    ROS No recent fever, bleeding abnormalities, urologic dysfunction, GI problems, or weight gain.  Blood pressure (!) 144/71, pulse (!) 56, temperature 97.9 F (36.6 C), temperature source Oral, resp. rate 16, height '5\' 10"'  (1.778 m), weight 63.5 kg, SpO2 100 %. Physical Exam  NCAT Cachectic build Left shoulder fentanyl patch RLE No redness, no warmth  Tenderness of the joint line but not the pouch  Chronic or acute on chronic swelling  Able to range from 10 to 45 degrees   No distal edema  Clubbing  Sens: DPN, SPN, TN intact  Motor: EHL, FHL, and lessor toe ext and flex all intact grossly  cap refill 2+, warm to  touch   Assessment/Plan: Right knee pain for four weeks, swelling, possible air or intra-articular process in suprapatellar pouch; no significant arthritic  change or joint space narrowing  1. Inflammatory markers elevated, CRP 20, ESR 111, cell count only 20 k WBC and no crystals; and examination does not suggest acute infection 2. We will follow cultures, stay off abx for now 3. Check MRI for better evaluation of suprapatellar pouch process--> if clinically worsens will proceed to I&D or repeat aspiration; can recheck inflam markers, as well.    Altamese Taylor, MD [facility], [facility] [phone]  07/17/2020  10:17 PM

## 2020-07-17 NOTE — ED Notes (Signed)
Pt not in room for me to obtain VS

## 2020-07-17 NOTE — ED Notes (Signed)
Report from [place], [place]. Pt is currently in MRI, will reassess on return to room

## 2020-07-17 NOTE — ED Provider Notes (Signed)
Care assumed at shift change from [place], [place]. See ntoe for full HPI.  In summation 59 year old with 3 weeks right knee pain and swelling. Initially tachy with soft BP in triage. Resolved when roomed.  Xray with possible Possible Osteo vs septic joint.  5cc removed by previous provider, blood tinged. Dr. Marcelino Scot with Ortho consulted. Request call back with tap results. Dispo per Ortho. Physical Exam  BP (!) 147/69   Pulse (!) 57   Temp 97.9 F (36.6 C) (Oral)   Resp 13   Ht 5\' 10"  (1.778 m)   Wt 63.5 kg   SpO2 96%   BMI 20.09 kg/m   Physical Exam Vitals and nursing note reviewed.  Constitutional:      General: He is not in acute distress.    Appearance: He is well-developed. He is not diaphoretic.  HENT:     Head: Atraumatic.  Eyes:     Pupils: Pupils are equal, round, and reactive to light.  Cardiovascular:     Rate and Rhythm: Normal rate and regular rhythm.  Pulmonary:     Effort: Pulmonary effort is normal. No respiratory distress.  Abdominal:     General: There is no distension.     Palpations: Abdomen is soft.  Musculoskeletal:        General: Normal range of motion.     Cervical back: Normal range of motion and neck supple.     Comments: Patient to right knee.  Puncture site to right lateral knee.  No active bleeding or drainage.  Knee held in slightly flexed position.  Skin:    General: Skin is warm and dry.     Capillary Refill: Capillary refill takes less than 2 seconds.     Comments: No erythema or warmth  Neurological:     Mental Status: He is alert.     Comments: Intact sensation     ED Course/Procedures     Procedures Labs Reviewed  COMPREHENSIVE METABOLIC PANEL - Abnormal; Notable for the following components:      Result Value   Potassium 5.3 (*)    Glucose, Bld 180 (*)    Albumin 2.9 (*)    All other components within normal limits  CBC WITH DIFFERENTIAL/PLATELET - Abnormal; Notable for the following components:   Monocytes Absolute 1.2 (*)     All other components within normal limits  SYNOVIAL CELL COUNT + DIFF, W/ CRYSTALS - Abnormal; Notable for the following components:   Color, Synovial RED (*)    Appearance-Synovial CLOUDY (*)    WBC, Synovial 20,350 (*)    Neutrophil, Synovial 97 (*)    Monocyte-Macrophage-Synovial Fluid 2 (*)    All other components within normal limits  C-REACTIVE PROTEIN - Abnormal; Notable for the following components:   CRP 20.5 (*)    All other components within normal limits  SEDIMENTATION RATE - Abnormal; Notable for the following components:   Sed Rate 111 (*)    All other components within normal limits  BODY FLUID CULTURE  CULTURE, BLOOD (ROUTINE X 2)  CULTURE, BLOOD (ROUTINE X 2)  LACTIC ACID, PLASMA  LACTIC ACID, PLASMA  URINALYSIS, ROUTINE W REFLEX MICROSCOPIC  GLUCOSE, BODY FLUID OTHER  PROTEIN, BODY FLUID (OTHER)  PROTIME-INR  DG Chest 2 View  Result Date: 07/17/2020 CLINICAL DATA:  Chest pain, sepsis EXAM: CHEST - 2 VIEW COMPARISON:  11/12/2018 FINDINGS: Frontal and lateral views of the chest demonstrate an unremarkable cardiac silhouette. No airspace disease, effusion, or pneumothorax. No acute  bony abnormalities. IMPRESSION: 1. No acute intrathoracic process. Electronically Signed   By: Randa Ngo M.D.   On: 07/17/2020 17:01   PERIPHERAL VASCULAR CATHETERIZATION  Result Date: 07/10/2020 Patient name: Stanley Chaney MRN: [id number] DOB: 04-Mar-1961 Sex: male 07/10/2020 Pre-operative Diagnosis: Right lower extremity pain with peripheral arterial disease Post-operative diagnosis:  Same Surgeon:  Eda Paschal. Donzetta Matters, MD Procedure Performed: 1.  Ultrasound-guided cannulation left common femoral artery 2.  Aortogram with bilateral lower extremity runoff 3.  Moderate sedation with fentanyl and Versed for 18 minutes Indications: 59 year old male has previously undergone bilateral lower extremity SFA intervention.  On the right side he had drug-coated balloon angioplasty.  On the  left side he had drug-coated balloon angioplasty followed by subsequent stenting.  He now has right lower extremity pain he is indicated for angiography with possible intervention. Findings:  the aorta and iliac segments are free of flow-limiting stenosis.  Bilateral renal arteries are patent.  Bilateral external iliac arteries are diminutive however no flow-limiting stenosis.  The right side which is the site of interest has recurrent disease approximately 50% stenosis in the SFA but is nonflow limiting.  Single-vessel runoff in line via the anterior tibial artery to the foot.  TP trunk is disease he then has posterior tibial artery running to the foot.  On the left side appears to have 3 vessels the posterior tibial is the dominant.  No intervention was undertaken.  Procedure:  The patient was identified in the holding area and taken to room 8.  The patient was then placed supine on the table and prepped and draped in the usual sterile fashion.  A time out was called.  Ultrasound was used to evaluate the left common femoral artery.  There was some disease.  The area was anesthetized 1% lidocaine cannulated micropuncture needle followed wire and sheath.  And images saved the permanent record.  Moderate sedation was administered with fentanyl and Versed and his vital signs were monitored throughout the course of the case.  Bentson wires placed followed 5 French sheath.  Omni catheter was placed to the level of L1.  Aortogram was performed followed by lower extremity runoff.  With the above findings we elected not to intervene.  The catheter was removed over wire.  Sheath will be removed in postoperative holding.  He tolerated procedure well without any complication. Contrast: 97cc Brandon C. Donzetta Matters, MD [facility] Office: [phone] Pager: [phone]   DG Knee Complete 4 Views Left  Result Date: 07/17/2020 CLINICAL DATA:  59 year old male with right knee pain. EXAM: LEFT KNEE -  COMPLETE 4+ VIEW COMPARISON:  None. FINDINGS: There is no acute fracture or dislocation. The bones are osteopenic. There is focal area of demineralization along the lateral aspect of the lateral tibial plateau which although may be related to osteopenia is concerning for osteomyelitis. Clinical correlation recommended. There is a moderate size suprapatellar effusion with pockets of air. There is mild skin thickening along the anterior knee. Vascular calcifications noted. IMPRESSION: 1. No acute fracture or dislocation. 2. Moderate suprapatellar effusion with pockets of air concerning for an infectious process. Further evaluation with MRI or joint aspiration recommended. 3. Focal area of demineralization along the lateral aspect of the lateral tibial plateau concerning for osteomyelitis. Electronically Signed   By: Anner Crete M.D.   On: 07/17/2020 16:14   DG Knee Complete 4 Views Right  Result Date: 07/17/2020 CLINICAL DATA:  59 year old male with right knee pain. EXAM: RIGHT KNEE - COMPLETE  4+ VIEW COMPARISON:  None. FINDINGS: There is no acute fracture or dislocation. The bones are osteopenic. There is focal area of demineralization along the lateral aspect of the lateral tibial plateau which although may be related to osteopenia is concerning for osteomyelitis. Clinical correlation recommended. There is a moderate size suprapatellar effusion with pockets of air. There is mild skin thickening along the anterior knee. Vascular calcifications noted. IMPRESSION: 1. No acute fracture or dislocation. 2. Moderate suprapatellar effusion with pockets of air concerning for an infectious process. Further evaluation with MRI or joint aspiration recommended. 3. Focal area of demineralization along the lateral aspect of the lateral tibial plateau concerning for osteomyelitis. Electronically Signed   By: Anner Crete M.D.   On: 07/17/2020 16:14   VAS [place] ABI WITH/WO TBI  Result Date: 06/23/2020 LOWER EXTREMITY  DOPPLER STUDY Indications: Rest pain, peripheral artery disease, and Constant bilateral lower              extremity pain and weakness 2 weeks. High Risk Factors: Diabetes, past history of smoking.  Vascular Interventions: Left SFA atherectomy and DCPTA on 06/15/2018. Right SFA                         atherectomy and DCPTA on 07-20-2018. Performing Technologist: Delorise Shiner RVT  Examination Guidelines: A complete evaluation includes at minimum, Doppler waveform signals and systolic blood pressure reading at the level of bilateral brachial, anterior tibial, and posterior tibial arteries, when vessel segments are accessible. Bilateral testing is considered an integral part of a complete examination. Photoelectric Plethysmograph (PPG) waveforms and toe systolic pressure readings are included as required and additional duplex testing as needed. Limited examinations for reoccurring indications may be performed as noted.  ABI Findings: +---------+------------------+-----+----------+--------+ Right    Rt Pressure (mmHg)IndexWaveform  Comment  +---------+------------------+-----+----------+--------+ Brachial 169                                       +---------+------------------+-----+----------+--------+ ATA      125               0.74                    +---------+------------------+-----+----------+--------+ PTA      115               0.68 monophasic         +---------+------------------+-----+----------+--------+ DP                              monophasic         +---------+------------------+-----+----------+--------+ Great Toe62                0.37                    +---------+------------------+-----+----------+--------+ +---------+------------------+-----+----------+-------+ Left     Lt Pressure (mmHg)IndexWaveform  Comment +---------+------------------+-----+----------+-------+ Brachial 167                                       +---------+------------------+-----+----------+-------+ ATA      118               0.70                   +---------+------------------+-----+----------+-------+  PTA      116               0.69 monophasic        +---------+------------------+-----+----------+-------+ DP                              biphasic          +---------+------------------+-----+----------+-------+ Great Toe65                0.38                   +---------+------------------+-----+----------+-------+ +-------+-----------+-----------+------------+------------+ ABI/TBIToday's ABIToday's TBIPrevious ABIPrevious TBI +-------+-----------+-----------+------------+------------+ Right  0.74       0.37       1.01        0.67         +-------+-----------+-----------+------------+------------+ Left   0.70       0.38       0.88        0.69         +-------+-----------+-----------+------------+------------+ Bilateral ABIs appear decreased compared to prior study on 08/13/2019.  Summary: Right: Resting right ankle-brachial index indicates moderate right lower extremity arterial disease. The right toe-brachial index is abnormal. RT great toe pressure = 62 mmHg. Left: Resting left ankle-brachial index indicates moderate left lower extremity arterial disease. The left toe-brachial index is abnormal. LT Great toe pressure = 65 mmHg.  *See table(s) above for measurements and observations.  Electronically signed by Servando Snare MD on 06/23/2020 at 5:41:58 PM.    Final    MDM  Patient with possible Osteo vs septic joint vs inflammatory arthritis. Knee aspiration by previous provider with blood tinged 5 cc. Sent for labs. Dr. Marcelino Scot with Ortho consulted, request call back once labs, aspiration resulted.  2149: CONSULT with Dr. Marcelino Scot with Ortho. He will evaluate patient and make formal recommendation.  Care transferred to attending Dr. Billy Fischer who will follow up with Dr. Marcelino Scot with Ortho for dispo       Safwan Tomei A, PA-C 07/17/20 2150    Gareth Morgan, MD 07/18/20 [phone]

## 2020-07-17 NOTE — ED Triage Notes (Signed)
C/o right knee pain + swelling x 3 weeks

## 2020-07-17 NOTE — ED Provider Notes (Signed)
[facility]ERGENCY DEPARTMENT Provider Note   CSN: [id number] Arrival date & time: 07/17/20  1415     History Chief Complaint  Patient presents with  . Knee Pain    Betty Brooks is a 59 y.o. male.  HPI Patient is 59 year old male with past medical history of DM, PAD, cellulitis, remote history of greater than 30 years ago right knee fracture.  Patient presents today with approximately 3 weeks of gradually worsening achy severe constant knee pain that is worse with movement and touch.  He states that over the past 3 days it has become so bad that he can barely bend it.  He denies any fevers, chills, nausea, vomiting, lightheadedness, dizziness, fatigue, malaise.  He states that he has noticed an ache and swelling in his right knee over the past 3 weeks.  He states that it is now significantly more swollen he states he has never had this happen before.  However he does say that greater than 30 years ago when he had his knee fracture and after surgery he was admitted to the hospital for several months because of some complication that required antibiotics.  He does not recall any details of his hospital stay however.  I discussed with son who was unable to add any additional details to the history.  Patient declined any formal translator during our discussion and seems to speak [place] fluently.  Was able to teach back my explanation for procedures and my answers to his questions.    Past Medical History:  Diagnosis Date  . Diabetes mellitus without complication ([place])   . PAD (peripheral artery disease) [facility])     Patient Active Problem List   Diagnosis Date Noted  . PAD (peripheral artery disease) ([place]) 07/20/2018  . Malnutrition of moderate degree 06/11/2018  . Cellulitis of left foot 06/10/2018  . Diabetes mellitus ([place]) 06/10/2018  . Hyperglycemia 06/10/2018  . Cellulitis 06/10/2018    Past Surgical History:  Procedure Laterality Date  . ABDOMINAL  AORTOGRAM W/LOWER EXTREMITY Bilateral 06/15/2018   Procedure: ABDOMINAL AORTOGRAM W/LOWER EXTREMITY;  Surgeon: [name], MD;  Location: [facility];  Service: Cardiovascular;  Laterality: Bilateral;  . ABDOMINAL AORTOGRAM W/LOWER EXTREMITY Left 12/30/2018   Procedure: ABDOMINAL AORTOGRAM W/LOWER EXTREMITY;  Surgeon: [name], MD;  Location: [place] [facility];  Service: Cardiovascular;  Laterality: Left;  . ABDOMINAL AORTOGRAM W/LOWER EXTREMITY N/A 07/10/2020   Procedure: ABDOMINAL AORTOGRAM W/LOWER EXTREMITY;  Surgeon: [name], MD;  Location: [facility];  Service: Cardiovascular;  Laterality: N/A;  Bilateral  . I & D EXTREMITY Left 06/12/2018   Procedure: Excisional Debridement left foot;  Surgeon: [name], MD;  Location: [facility];  Service: Orthopedics;  Laterality: Left;  . LOWER EXTREMITY ANGIOGRAM Right 07/20/2018     Right lower extremity angiogram  . LOWER EXTREMITY ANGIOGRAPHY Right 07/20/2018   Procedure: Lower Extremity Angiography;  Surgeon: [name], MD;  Location: [facility];  Service: Cardiovascular;  Laterality: Right;  . PERIPHERAL VASCULAR ATHERECTOMY Left 06/15/2018   Procedure: PERIPHERAL VASCULAR ATHERECTOMY;  Surgeon: [name], MD;  Location: [facility];  Service: Cardiovascular;  Laterality: Left;  SFA  . PERIPHERAL VASCULAR BALLOON ANGIOPLASTY Left 06/15/2018   Procedure: PERIPHERAL VASCULAR BALLOON ANGIOPLASTY;  Surgeon: [name], MD;  Location: [facility];  Service: Cardiovascular;  Laterality: Left;  SFA  . PERIPHERAL VASCULAR BALLOON ANGIOPLASTY Right 07/20/2018   Procedure: PERIPHERAL VASCULAR  BALLOON ANGIOPLASTY;  Surgeon: [name], MD;  Location: [facility];  Service: Cardiovascular;  Laterality: Right;  SFA WITH DRUG COATED   . PERIPHERAL VASCULAR INTERVENTION Left 12/30/2018   Procedure: PERIPHERAL  VASCULAR INTERVENTION;  Surgeon: [name], MD;  Location: [facility];  Service: Cardiovascular;  Laterality: Left;  SFA       Family History  Problem Relation Age of Onset  . Diabetes Mother   . Cancer Mother     Social History   Tobacco Use  . Smoking status: Former Smoker    Quit date: 06/14/2018    Years since quitting: 2.0  . Smokeless tobacco: Never Used  . Tobacco comment: pt reports quitting 6 months ago  Vaping Use  . Vaping Use: Never used  Substance Use Topics  . Alcohol use: Yes    Alcohol/week: 18.0 standard drinks    Types: 18 Cans of beer per week    Comment: pt reports not drinking for past 6 months  . Drug use: Never    Home Medications Prior to Admission medications   Medication Sig Start Date End Date Taking? Authorizing Provider  aspirin 81 MG EC tablet Take 1 tablet (81 mg total) by mouth daily. 07/15/18  Yes Clent Demark, PA-C  blood glucose meter kit and supplies Dispense based on patient and insurance preference. Use up to four times daily as directed. (FOR ICD-10 E10.9, E11.9). 06/16/18  Yes Domenic Polite, MD  diclofenac Sodium (VOLTAREN) 1 % GEL Apply 2 g topically daily as needed (For knee pain).   Yes [provider]  Ibuprofen (ADVIL LIQUI-GELS MINIS) 200 MG CAPS Take 1 capsule by mouth daily as needed (For knee pain).   Yes [provider]  atorvastatin (LIPITOR) 10 MG tablet Take 1 tablet (10 mg total) by mouth daily. Patient not taking: Reported on 06/16/2020 11/17/19   Wendie Agreste, MD  clopidogrel (PLAVIX) 75 MG tablet Take 1 tablet (75 mg total) by mouth daily. Patient not taking: Reported on 11/17/2019 05/27/19   Nickel, Sharmon Leyden, NP  empagliflozin (JARDIANCE) 25 MG TABS tablet Take 25 mg by mouth daily before breakfast. Patient not taking: Reported on 06/16/2020 11/17/19   Wendie Agreste, MD  gabapentin (NEURONTIN) 300 MG capsule Take 1 capsule (300 mg total) by mouth 3 (three) times  daily. Patient not taking: Reported on 06/23/2020 06/20/20   Maximiano Coss, NP  Lancets MISC Inject 1 each into the skin 3 (three) times daily as needed. Patient not taking: Reported on 07/17/2020 09/14/18   Clent Demark, PA-C  metFORMIN (GLUCOPHAGE) 500 MG tablet Take 2 tablets (1,000 mg total) by mouth 2 (two) times daily with a meal. i Patient not taking: Reported on 06/16/2020 11/17/19   Wendie Agreste, MD    Allergies    Patient has no known allergies.  Review of Systems   Review of Systems  Constitutional: Negative for chills and fever.  HENT: Negative for congestion.   Eyes: Negative for pain.  Respiratory: Negative for cough and shortness of breath.   Cardiovascular: Negative for chest pain and leg swelling.  Gastrointestinal: Negative for abdominal pain and vomiting.  Genitourinary: Negative for dysuria.  Musculoskeletal: Positive for myalgias.       Right knee pain and swelling  Skin: Negative for rash.  Neurological: Negative for dizziness and headaches.    Physical Exam Updated Vital Signs BP 119/62   Pulse (!) 52   Temp (!) 97.5 F (  36.4 C) (Oral)   Resp 14   Ht '5\' 10"'  (1.778 m)   Wt 63.5 kg   SpO2 97%   BMI 20.09 kg/m   Physical Exam Vitals and nursing note reviewed.  Constitutional:      General: He is not in acute distress. HENT:     Head: Normocephalic and atraumatic.     Nose: Nose normal.  Eyes:     General: No scleral icterus. Cardiovascular:     Rate and Rhythm: Normal rate and regular rhythm.     Pulses: Normal pulses.     Heart sounds: Normal heart sounds.     Comments: Heart rate within normal limits at approximately seventy  Good cap refill in bilateral feet and toes.  DP PT pulses are palpable albeit somewhat diminished Pulmonary:     Effort: Pulmonary effort is normal. No respiratory distress.     Breath sounds: No wheezing.  Abdominal:     Palpations: Abdomen is soft.     Tenderness: There is no abdominal tenderness.    Musculoskeletal:     Cervical back: Normal range of motion.     Right lower leg: No edema.     Left lower leg: No edema.     Comments: Significant right knee swelling with severe tenderness to palpation of the medial and lateral knee unable to range knee more than approximately 5 degrees without severe pain.  Knee is not warm to touch.  Positive ballottement.  Skin:    General: Skin is warm and dry.     Capillary Refill: Capillary refill takes less than 2 seconds.  Neurological:     Mental Status: He is alert. Mental status is at baseline.  Psychiatric:        Mood and Affect: Mood normal.        Behavior: Behavior normal.       ED Results / Procedures / Treatments   Labs (all labs ordered are listed, but only abnormal results are displayed) Labs Reviewed  COMPREHENSIVE METABOLIC PANEL - Abnormal; Notable for the following components:      Result Value   Potassium 5.3 (*)    Glucose, Bld 180 (*)    Albumin 2.9 (*)    All other components within normal limits  CBC WITH DIFFERENTIAL/PLATELET - Abnormal; Notable for the following components:   Monocytes Absolute 1.2 (*)    All other components within normal limits  C-REACTIVE PROTEIN - Abnormal; Notable for the following components:   CRP 20.5 (*)    All other components within normal limits  SEDIMENTATION RATE - Abnormal; Notable for the following components:   Sed Rate 111 (*)    All other components within normal limits  CULTURE, BLOOD (ROUTINE X 2)  CULTURE, BLOOD (ROUTINE X 2)  BODY FLUID CULTURE  GRAM STAIN  LACTIC ACID, PLASMA  LACTIC ACID, PLASMA  URINALYSIS, ROUTINE W REFLEX MICROSCOPIC  GLUCOSE, BODY FLUID OTHER  PROTEIN, BODY FLUID (OTHER)  SYNOVIAL CELL COUNT + DIFF, W/ CRYSTALS  PROTIME-INR    EKG None  Radiology DG Chest 2 View  Result Date: 07/17/2020 CLINICAL DATA:  Chest pain, sepsis EXAM: CHEST - 2 VIEW COMPARISON:  11/12/2018 FINDINGS: Frontal and lateral views of the chest  demonstrate an unremarkable cardiac silhouette. No airspace disease, effusion, or pneumothorax. No acute bony abnormalities. IMPRESSION: 1. No acute intrathoracic process. Electronically Signed   By: Randa Ngo M.D.   On: 07/17/2020 17:01   DG Knee Complete 4 Views Left  Result  Date: 07/17/2020 CLINICAL DATA:  59 year old male with right knee pain. EXAM: LEFT KNEE - COMPLETE 4+ VIEW COMPARISON:  None. FINDINGS: There is no acute fracture or dislocation. The bones are osteopenic. There is focal area of demineralization along the lateral aspect of the lateral tibial plateau which although may be related to osteopenia is concerning for osteomyelitis. Clinical correlation recommended. There is a moderate size suprapatellar effusion with pockets of air. There is mild skin thickening along the anterior knee. Vascular calcifications noted. IMPRESSION: 1. No acute fracture or dislocation. 2. Moderate suprapatellar effusion with pockets of air concerning for an infectious process. Further evaluation with MRI or joint aspiration recommended. 3. Focal area of demineralization along the lateral aspect of the lateral tibial plateau concerning for osteomyelitis. Electronically Signed   By: Anner Crete M.D.   On: 07/17/2020 16:14   DG Knee Complete 4 Views Right  Result Date: 07/17/2020 CLINICAL DATA:  60 year old male with right knee pain. EXAM: RIGHT KNEE - COMPLETE 4+ VIEW COMPARISON:  None. FINDINGS: There is no acute fracture or dislocation. The bones are osteopenic. There is focal area of demineralization along the lateral aspect of the lateral tibial plateau which although may be related to osteopenia is concerning for osteomyelitis. Clinical correlation recommended. There is a moderate size suprapatellar effusion with pockets of air. There is mild skin thickening along the anterior knee. Vascular calcifications noted. IMPRESSION: 1. No acute fracture or dislocation. 2. Moderate suprapatellar effusion with  pockets of air concerning for an infectious process. Further evaluation with MRI or joint aspiration recommended. 3. Focal area of demineralization along the lateral aspect of the lateral tibial plateau concerning for osteomyelitis. Electronically Signed   By: Anner Crete M.D.   On: 07/17/2020 16:14    Procedures .Joint Aspiration/Arthrocentesis  Date/Time: 07/17/2020 7:56 PM Performed by: Tedd Sias, PA Authorized by: Tedd Sias, PA   Consent:    Consent obtained:  Verbal   Consent given by:  Patient   Risks discussed:  Bleeding, infection, pain, incomplete drainage and nerve damage   Alternatives discussed:  No treatment, delayed treatment, observation and referral Location:    Location:  Knee   Knee:  R knee Anesthesia (see MAR for exact dosages):    Anesthesia method:  Local infiltration   Local anesthetic:  Lidocaine 1% w/o epi Procedure details:    Preparation: Patient was prepped and draped in usual sterile fashion     Approach:  Lateral   Aspirate amount:  5 cc   Aspirate characteristics:  Blood-tinged and cloudy   Steroid injected: no     Specimen collected: yes   Post-procedure details:    Dressing:  Adhesive bandage   Patient tolerance of procedure:  Tolerated well, no immediate complications Comments:     Joint aspiration was done with sterile procedure.  Patient tolerated procedure well.  Relatively minimal sample was collected that was blood-tinged and cloudy.   (including critical care time)  Medications Ordered in ED Medications  lidocaine (PF) (XYLOCAINE) 1 % injection 30 mL (30 mLs Other Given by Other 07/17/20 1835)  oxyCODONE-acetaminophen (PERCOCET/ROXICET) 5-325 MG per tablet 2 tablet (2 tablets Oral Given 07/17/20 1855)    ED Course  I have reviewed the triage vital signs and the nursing notes.  Pertinent labs & imaging results that were available during my care of the patient were reviewed by me and considered in my medical decision  making (see chart for details).    MDM Rules/Calculators/A&P  Patient with right knee pain progressive for the past 3 weeks was initially tachycardic on presentation but on my evaluation has not been tachycardic he is not febrile and nontoxic-appearing in general.  He is in significant pain is unable to range his knee at all on my examination.  He has had recent invasive aortogram also has some poor dentition these are possible sources of infection however timeline is more consistent with inflammatory arthritis.  ESR and CRP are significantly elevated.  CBC without leukocytosis or anemia.  CMP with very mild hyperkalemia  and some mild elevation in blood sugar.  The seem to be unrelated to his symptoms today.  X-ray of right knee shows concerning findings of questionable air in the visualized effusion.  Chest x-ray also reviewed by myself with no acute disease this was obtained by triage nurse as well as left knee x-ray which seems to have been erroneously copied and pasted from right knee x-ray.  Discussed case with Dr. Marcelino Scot of orthopedics--appreciate his expert consultation. Will tap the right knee and send for lab work.  Per Dr. Marcelino Scot the timeline of 3 weeks of progressively worsening right knee pain is more consistent with inflammatory arthritis of some kind.  Patient care handed off to PA Henderly who will follow up on results of knee aspirate.  We will then touch base with Dr. Marcelino Scot who has been added to the treatment team at his request.  Final Clinical Impression(s) / ED Diagnoses Final diagnoses:  Knee pain    Rx / DC Orders ED Discharge Orders    None       Tedd Sias, [place] 07/17/20 2001    Gareth Morgan, MD 07/18/20 [phone]

## 2020-07-17 NOTE — ED Notes (Signed)
Pt transported to MRI at this time 

## 2020-07-18 ENCOUNTER — Inpatient Hospital Stay (HOSPITAL_COMMUNITY): Payer: Self-pay | Admitting: Anesthesiology

## 2020-07-18 ENCOUNTER — Encounter (HOSPITAL_COMMUNITY)
Admission: EM | Disposition: A | Discharge status: Home Health | Payer: Self-pay | Source: Home / Self Care | Attending: Orthopedic Surgery

## 2020-07-18 DIAGNOSIS — M009 Pyogenic arthritis, unspecified: Secondary | ICD-10-CM | POA: Diagnosis present

## 2020-07-18 HISTORY — PX: KNEE ARTHROSCOPY: SHX127

## 2020-07-18 HISTORY — PX: IRRIGATION AND DEBRIDEMENT KNEE: SHX5185

## 2020-07-18 LAB — RESPIRATORY PANEL BY RT PCR (FLU A&B, COVID)
Influenza A by PCR: NEGATIVE
Influenza B by PCR: NEGATIVE
SARS Coronavirus 2 by RT PCR: NEGATIVE

## 2020-07-18 LAB — CBC WITH DIFFERENTIAL/PLATELET
Abs Immature Granulocytes: 0.03 10*3/uL (ref 0.00–0.07)
Basophils Absolute: 0 10*3/uL (ref 0.0–0.1)
Basophils Relative: 1 %
Eosinophils Absolute: 0.1 10*3/uL (ref 0.0–0.5)
Eosinophils Relative: 2 %
HCT: 36.9 % — ABNORMAL LOW (ref 39.0–52.0)
Hemoglobin: 11.6 g/dL — ABNORMAL LOW (ref 13.0–17.0)
Immature Granulocytes: 1 %
Lymphocytes Relative: 27 %
Lymphs Abs: 1.6 10*3/uL (ref 0.7–4.0)
MCH: 28.5 pg (ref 26.0–34.0)
MCHC: 31.4 g/dL (ref 30.0–36.0)
MCV: 90.7 fL (ref 80.0–100.0)
Monocytes Absolute: 0.9 10*3/uL (ref 0.1–1.0)
Monocytes Relative: 15 %
Neutro Abs: 3.2 10*3/uL (ref 1.7–7.7)
Neutrophils Relative %: 54 %
Platelets: 291 10*3/uL (ref 150–400)
RBC: 4.07 MIL/uL — ABNORMAL LOW (ref 4.22–5.81)
RDW: 12 % (ref 11.5–15.5)
WBC: 5.9 10*3/uL (ref 4.0–10.5)
nRBC: 0 % (ref 0.0–0.2)

## 2020-07-18 LAB — HIV ANTIBODY (ROUTINE TESTING W REFLEX): HIV Screen 4th Generation wRfx: NONREACTIVE

## 2020-07-18 LAB — SYNOVIAL CELL COUNT + DIFF, W/ CRYSTALS
Crystals, Fluid: NONE SEEN
Eosinophils-Synovial: 0 % (ref 0–1)
Lymphocytes-Synovial Fld: 2 % (ref 0–20)
Monocyte-Macrophage-Synovial Fluid: 0 % — ABNORMAL LOW (ref 50–90)
Neutrophil, Synovial: 98 % — ABNORMAL HIGH (ref 0–25)
WBC, Synovial: 44000 /mm3 — ABNORMAL HIGH (ref 0–200)

## 2020-07-18 LAB — GLUCOSE, BODY FLUID OTHER: Glucose, Body Fluid Other: 18 mg/dL

## 2020-07-18 LAB — COMPREHENSIVE METABOLIC PANEL
ALT: 16 U/L (ref 0–44)
AST: 13 U/L — ABNORMAL LOW (ref 15–41)
Albumin: 2.5 g/dL — ABNORMAL LOW (ref 3.5–5.0)
Alkaline Phosphatase: 75 U/L (ref 38–126)
Anion gap: 10 (ref 5–15)
BUN: 13 mg/dL (ref 8–23)
CO2: 30 mmol/L (ref 22–32)
Calcium: 9.5 mg/dL (ref 8.9–10.3)
Chloride: 100 mmol/L (ref 98–111)
Creatinine, Ser: 0.76 mg/dL (ref 0.61–1.24)
GFR, Estimated: >60 mL/min (ref >60)
Glucose, Bld: 131 mg/dL — ABNORMAL HIGH (ref 70–99)
Potassium: 4.4 mmol/L (ref 3.5–5.1)
Sodium: 140 mmol/L (ref 135–145)
Total Bilirubin: 0.9 mg/dL (ref 0.3–1.2)
Total Protein: 6.9 g/dL (ref 6.5–8.1)

## 2020-07-18 LAB — URINALYSIS, ROUTINE W REFLEX MICROSCOPIC
Bilirubin Urine: NEGATIVE
Glucose, UA: NEGATIVE mg/dL
Hgb urine dipstick: NEGATIVE
Ketones, ur: 5 mg/dL — AB
Leukocytes,Ua: NEGATIVE
Nitrite: NEGATIVE
Protein, ur: NEGATIVE mg/dL
Specific Gravity, Urine: 1.02 (ref 1.005–1.030)
pH: 5 (ref 5.0–8.0)

## 2020-07-18 LAB — CBG MONITORING, ED
Glucose-Capillary: 102 mg/dL — ABNORMAL HIGH (ref 70–99)
Glucose-Capillary: 101 mg/dL — ABNORMAL HIGH (ref 70–99)

## 2020-07-18 LAB — PROTEIN, BODY FLUID (OTHER): Total Protein, Body Fluid Other: 3.9 g/dL

## 2020-07-18 LAB — RAPID URINE DRUG SCREEN, HOSP PERFORMED
Amphetamines: NOT DETECTED
Barbiturates: NOT DETECTED
Benzodiazepines: NOT DETECTED
Cocaine: NOT DETECTED
Opiates: NOT DETECTED
Tetrahydrocannabinol: NOT DETECTED

## 2020-07-18 LAB — GLUCOSE, CAPILLARY
Glucose-Capillary: 99 mg/dL (ref 70–99)
Glucose-Capillary: 100 mg/dL — ABNORMAL HIGH (ref 70–99)

## 2020-07-18 LAB — PROTIME-INR
INR: 1.1 (ref 0.8–1.2)
Prothrombin Time: 13.5 seconds (ref 11.4–15.2)

## 2020-07-18 LAB — HEMOGLOBIN A1C
Hgb A1c MFr Bld: 7.7 % — ABNORMAL HIGH (ref 4.8–5.6)
Mean Plasma Glucose: 174.29 mg/dL

## 2020-07-18 LAB — LACTIC ACID, PLASMA: Lactic Acid, Venous: 4.6 mmol/L (ref 0.5–1.9)

## 2020-07-18 LAB — ETHANOL: Alcohol, Ethyl (B): <10 mg/dL (ref <10)

## 2020-07-18 SURGERY — ARTHROSCOPY, KNEE
Anesthesia: General | Site: Knee | Laterality: Right

## 2020-07-18 MED ORDER — SODIUM CHLORIDE 0.9 % IR SOLN
Status: DC | PRN
  Administered 2020-07-18 (×5): 3000 mL

## 2020-07-18 MED ORDER — HYDROCODONE-ACETAMINOPHEN 5-325 MG PO TABS
1.0000 | ORAL_TABLET | ORAL | Status: DC | PRN
  Administered 2020-07-19: 1 via ORAL
  Filled 2020-07-18 (×2): qty 1

## 2020-07-18 MED ORDER — LACTATED RINGERS IV SOLN: INTRAVENOUS | Status: DC

## 2020-07-18 MED ORDER — MORPHINE SULFATE (PF) 2 MG/ML IV SOLN: 0.5000 mg | INTRAVENOUS | Status: DC | PRN

## 2020-07-18 MED ORDER — SUCCINYLCHOLINE CHLORIDE 200 MG/10ML IV SOSY
PREFILLED_SYRINGE | INTRAVENOUS | Status: AC
  Filled 2020-07-18: qty 10

## 2020-07-18 MED ORDER — ACETAMINOPHEN 500 MG PO TABS: 1000.0000 mg | ORAL_TABLET | Freq: Once | ORAL | Status: DC

## 2020-07-18 MED ORDER — BUPIVACAINE-EPINEPHRINE (PF) 0.5% -1:200000 IJ SOLN
INTRAMUSCULAR | Status: DC | PRN
  Administered 2020-07-18: 50 mL

## 2020-07-18 MED ORDER — CHLORHEXIDINE GLUCONATE 0.12 % MT SOLN
OROMUCOSAL | Status: AC
  Administered 2020-07-18: 15 mL via OROMUCOSAL
  Filled 2020-07-18: qty 15

## 2020-07-18 MED ORDER — LIDOCAINE HCL (CARDIAC) PF 100 MG/5ML IV SOSY
PREFILLED_SYRINGE | INTRAVENOUS | Status: DC | PRN
  Administered 2020-07-18: 30 mg via INTRAVENOUS

## 2020-07-18 MED ORDER — HYDROCODONE-ACETAMINOPHEN 7.5-325 MG PO TABS: 1.0000 | ORAL_TABLET | ORAL | Status: DC | PRN

## 2020-07-18 MED ORDER — ONDANSETRON HCL 4 MG/2ML IJ SOLN
INTRAMUSCULAR | Status: DC | PRN
  Administered 2020-07-18: 4 mg via INTRAVENOUS

## 2020-07-18 MED ORDER — LACTATED RINGERS IV SOLN: INTRAVENOUS | Status: DC | PRN

## 2020-07-18 MED ORDER — LIDOCAINE 2% (20 MG/ML) 5 ML SYRINGE
INTRAMUSCULAR | Status: AC
  Filled 2020-07-18: qty 10

## 2020-07-18 MED ORDER — ACETAMINOPHEN 500 MG PO TABS
500.0000 mg | ORAL_TABLET | Freq: Two times a day (BID) | ORAL | Status: DC
  Administered 2020-07-18 – 2020-07-21 (×6): 500 mg via ORAL
  Filled 2020-07-18 (×6): qty 1

## 2020-07-18 MED ORDER — EPHEDRINE 5 MG/ML INJ
INTRAVENOUS | Status: AC
  Filled 2020-07-18: qty 10

## 2020-07-18 MED ORDER — PHENYLEPHRINE 40 MCG/ML (10ML) SYRINGE FOR IV PUSH (FOR BLOOD PRESSURE SUPPORT)
PREFILLED_SYRINGE | INTRAVENOUS | Status: AC
  Filled 2020-07-18: qty 10

## 2020-07-18 MED ORDER — FENTANYL CITRATE (PF) 250 MCG/5ML IJ SOLN
INTRAMUSCULAR | Status: AC
  Filled 2020-07-18: qty 5

## 2020-07-18 MED ORDER — ONDANSETRON HCL 4 MG/2ML IJ SOLN: 4.0000 mg | Freq: Four times a day (QID) | INTRAMUSCULAR | Status: DC | PRN

## 2020-07-18 MED ORDER — FENTANYL CITRATE (PF) 100 MCG/2ML IJ SOLN
INTRAMUSCULAR | Status: DC | PRN
  Administered 2020-07-18 (×2): 50 ug via INTRAVENOUS
  Administered 2020-07-18 (×4): 25 ug via INTRAVENOUS
  Administered 2020-07-18: 50 ug via INTRAVENOUS

## 2020-07-18 MED ORDER — POTASSIUM CHLORIDE IN NACL 20-0.9 MEQ/L-% IV SOLN
INTRAVENOUS | Status: DC
  Filled 2020-07-18 (×2): qty 1000

## 2020-07-18 MED ORDER — SODIUM CHLORIDE 0.9 % IV SOLN: INTRAVENOUS | Status: DC

## 2020-07-18 MED ORDER — PROPOFOL 10 MG/ML IV BOLUS
INTRAVENOUS | Status: AC
  Filled 2020-07-18: qty 20

## 2020-07-18 MED ORDER — PROMETHAZINE HCL 25 MG/ML IJ SOLN: 6.2500 mg | INTRAMUSCULAR | Status: DC | PRN

## 2020-07-18 MED ORDER — PIPERACILLIN-TAZOBACTAM 3.375 G IVPB
3.3750 g | Freq: Three times a day (TID) | INTRAVENOUS | Status: AC
  Administered 2020-07-18 – 2020-07-20 (×9): 3.375 g via INTRAVENOUS
  Filled 2020-07-18 (×9): qty 50

## 2020-07-18 MED ORDER — ACETAMINOPHEN 325 MG PO TABS: 325.0000 mg | ORAL_TABLET | Freq: Four times a day (QID) | ORAL | Status: DC | PRN

## 2020-07-18 MED ORDER — ONDANSETRON HCL 4 MG/2ML IJ SOLN
INTRAMUSCULAR | Status: AC
  Filled 2020-07-18: qty 2

## 2020-07-18 MED ORDER — CELECOXIB 200 MG PO CAPS
200.0000 mg | ORAL_CAPSULE | Freq: Once | ORAL | Status: AC
  Administered 2020-07-18: 200 mg via ORAL
  Filled 2020-07-18: qty 1

## 2020-07-18 MED ORDER — METOCLOPRAMIDE HCL 5 MG PO TABS: 5.0000 mg | ORAL_TABLET | Freq: Three times a day (TID) | ORAL | Status: DC | PRN

## 2020-07-18 MED ORDER — BUPIVACAINE-EPINEPHRINE 0.5% -1:200000 IJ SOLN
INTRAMUSCULAR | Status: AC
  Filled 2020-07-18: qty 1

## 2020-07-18 MED ORDER — FENTANYL CITRATE (PF) 100 MCG/2ML IJ SOLN
INTRAMUSCULAR | Status: AC
  Filled 2020-07-18: qty 2

## 2020-07-18 MED ORDER — METHOCARBAMOL 500 MG PO TABS
500.0000 mg | ORAL_TABLET | Freq: Four times a day (QID) | ORAL | Status: DC | PRN
  Administered 2020-07-19: 500 mg via ORAL
  Filled 2020-07-18: qty 1

## 2020-07-18 MED ORDER — CHLORHEXIDINE GLUCONATE 0.12 % MT SOLN: 15.0000 mL | Freq: Once | OROMUCOSAL | Status: AC

## 2020-07-18 MED ORDER — CELECOXIB 200 MG PO CAPS: 200.0000 mg | ORAL_CAPSULE | Freq: Once | ORAL | Status: DC

## 2020-07-18 MED ORDER — ONDANSETRON HCL 4 MG PO TABS: 4.0000 mg | ORAL_TABLET | Freq: Four times a day (QID) | ORAL | Status: DC | PRN

## 2020-07-18 MED ORDER — ROCURONIUM BROMIDE 10 MG/ML (PF) SYRINGE
PREFILLED_SYRINGE | INTRAVENOUS | Status: AC
  Filled 2020-07-18: qty 10

## 2020-07-18 MED ORDER — LIDOCAINE HCL (PF) 1 % IJ SOLN
30.0000 mL | Freq: Once | INTRAMUSCULAR | Status: DC
  Filled 2020-07-18: qty 30

## 2020-07-18 MED ORDER — DEXAMETHASONE SODIUM PHOSPHATE 10 MG/ML IJ SOLN
INTRAMUSCULAR | Status: AC
  Filled 2020-07-18: qty 1

## 2020-07-18 MED ORDER — INSULIN ASPART 100 UNIT/ML ~~LOC~~ SOLN
0.0000 [IU] | Freq: Three times a day (TID) | SUBCUTANEOUS | Status: DC
  Administered 2020-07-19 – 2020-07-21 (×2): 2 [IU] via SUBCUTANEOUS

## 2020-07-18 MED ORDER — DOCUSATE SODIUM 100 MG PO CAPS
100.0000 mg | ORAL_CAPSULE | Freq: Two times a day (BID) | ORAL | Status: DC
  Administered 2020-07-19 – 2020-07-21 (×5): 100 mg via ORAL
  Filled 2020-07-18 (×5): qty 1

## 2020-07-18 MED ORDER — PROPOFOL 10 MG/ML IV BOLUS
INTRAVENOUS | Status: DC | PRN
  Administered 2020-07-18: 200 mg via INTRAVENOUS

## 2020-07-18 MED ORDER — 0.9 % SODIUM CHLORIDE (POUR BTL) OPTIME
TOPICAL | Status: DC | PRN
  Administered 2020-07-18: 1000 mL

## 2020-07-18 MED ORDER — HYDROCODONE-ACETAMINOPHEN 5-325 MG PO TABS: 1.0000 | ORAL_TABLET | Freq: Four times a day (QID) | ORAL | Status: DC | PRN

## 2020-07-18 MED ORDER — ENOXAPARIN SODIUM 40 MG/0.4ML ~~LOC~~ SOLN
40.0000 mg | SUBCUTANEOUS | Status: DC
  Administered 2020-07-19 – 2020-07-21 (×3): 40 mg via SUBCUTANEOUS
  Filled 2020-07-18 (×3): qty 0.4

## 2020-07-18 MED ORDER — FENTANYL CITRATE (PF) 100 MCG/2ML IJ SOLN
25.0000 ug | INTRAMUSCULAR | Status: DC | PRN
  Administered 2020-07-18: 50 ug via INTRAVENOUS

## 2020-07-18 MED ORDER — MIDAZOLAM HCL 5 MG/5ML IJ SOLN
INTRAMUSCULAR | Status: DC | PRN
  Administered 2020-07-18: 2 mg via INTRAVENOUS

## 2020-07-18 MED ORDER — METHOCARBAMOL 500 MG PO TABS: 1000.0000 mg | ORAL_TABLET | Freq: Three times a day (TID) | ORAL | Status: DC | PRN

## 2020-07-18 MED ORDER — PENTAFLUOROPROP-TETRAFLUOROETH EX AERO
INHALATION_SPRAY | CUTANEOUS | Status: AC
  Filled 2020-07-18: qty 116

## 2020-07-18 MED ORDER — METHOCARBAMOL 1000 MG/10ML IJ SOLN
500.0000 mg | Freq: Four times a day (QID) | INTRAVENOUS | Status: DC | PRN
  Filled 2020-07-18: qty 5

## 2020-07-18 MED ORDER — ACETAMINOPHEN 500 MG PO TABS
1000.0000 mg | ORAL_TABLET | Freq: Once | ORAL | Status: AC
  Administered 2020-07-18: 1000 mg via ORAL
  Filled 2020-07-18: qty 2

## 2020-07-18 MED ORDER — METOCLOPRAMIDE HCL 5 MG/ML IJ SOLN: 5.0000 mg | Freq: Three times a day (TID) | INTRAMUSCULAR | Status: DC | PRN

## 2020-07-18 SURGICAL SUPPLY — 47 items
BANDAGE ESMARK 6X9 LF (GAUZE/BANDAGES/DRESSINGS) IMPLANT
BLADE CUDA 5.5 (BLADE) IMPLANT
BLADE EXCALIBUR 4.0MM X 13CM (MISCELLANEOUS) ×1
BLADE EXCALIBUR 4.0X13 (MISCELLANEOUS) ×2 IMPLANT
BLADE SURG 11 STRL SS (BLADE) ×3 IMPLANT
BNDG CMPR 9X6 STRL LF SNTH (GAUZE/BANDAGES/DRESSINGS)
BNDG ELASTIC 4X5.8 VLCR STR LF (GAUZE/BANDAGES/DRESSINGS) ×2 IMPLANT
BNDG ELASTIC 6X5.8 VLCR STR LF (GAUZE/BANDAGES/DRESSINGS) ×4 IMPLANT
BNDG ESMARK 6X9 LF (GAUZE/BANDAGES/DRESSINGS)
BRUSH SCRUB EZ PLAIN DRY (MISCELLANEOUS) ×4 IMPLANT
COVER SURGICAL LIGHT HANDLE (MISCELLANEOUS) ×4 IMPLANT
COVER WAND RF STERILE (DRAPES) ×1 IMPLANT
CUFF TOURN SGL QUICK 34 (TOURNIQUET CUFF)
CUFF TOURN SGL QUICK 42 (TOURNIQUET CUFF) IMPLANT
CUFF TRNQT CYL 34X4.125X (TOURNIQUET CUFF) IMPLANT
DRAPE ARTHROSCOPY W/POUCH 114 (DRAPES) ×3 IMPLANT
DRAPE U-SHAPE 47X51 STRL (DRAPES) ×3 IMPLANT
DRSG EMULSION OIL 3X3 NADH (GAUZE/BANDAGES/DRESSINGS) ×3 IMPLANT
DRSG PAD ABDOMINAL 8X10 ST (GAUZE/BANDAGES/DRESSINGS) ×2 IMPLANT
GAUZE 4X4 16PLY RFD (DISPOSABLE) ×1 IMPLANT
GAUZE SPONGE 4X4 12PLY STRL (GAUZE/BANDAGES/DRESSINGS) ×1 IMPLANT
GAUZE SPONGE 4X4 12PLY STRL LF (GAUZE/BANDAGES/DRESSINGS) ×2 IMPLANT
GLOVE BIO SURGEON STRL SZ7.5 (GLOVE) ×1 IMPLANT
GLOVE BIO SURGEON STRL SZ8 (GLOVE) ×5 IMPLANT
GLOVE BIOGEL PI IND STRL 7.5 (GLOVE) ×1 IMPLANT
GLOVE BIOGEL PI IND STRL 8 (GLOVE) ×1 IMPLANT
GLOVE BIOGEL PI INDICATOR 7.5 (GLOVE) ×4
GLOVE BIOGEL PI INDICATOR 8 (GLOVE) ×2
GLOVE SURG SS PI 7.5 STRL IVOR (GLOVE) ×2 IMPLANT
GOWN STRL REUS W/ TWL LRG LVL3 (GOWN DISPOSABLE) ×2 IMPLANT
GOWN STRL REUS W/ TWL XL LVL3 (GOWN DISPOSABLE) ×1 IMPLANT
GOWN STRL REUS W/TWL LRG LVL3 (GOWN DISPOSABLE) ×9
GOWN STRL REUS W/TWL XL LVL3 (GOWN DISPOSABLE) ×6
KIT BASIN OR (CUSTOM PROCEDURE TRAY) ×3 IMPLANT
KIT TURNOVER KIT B (KITS) ×3 IMPLANT
MANIFOLD NEPTUNE II (INSTRUMENTS) ×5 IMPLANT
PACK ARTHROSCOPY DSU (CUSTOM PROCEDURE TRAY) ×3 IMPLANT
PAD ABD 8X10 STRL (GAUZE/BANDAGES/DRESSINGS) ×2 IMPLANT
PAD ARMBOARD 7.5X6 YLW CONV (MISCELLANEOUS) ×6 IMPLANT
PADDING CAST COTTON 6X4 STRL (CAST SUPPLIES) ×4 IMPLANT
PORT APPOLLO RF 90DEGREE MULTI (SURGICAL WAND) ×2 IMPLANT
SUT ETHILON 4 0 PS 2 18 (SUTURE) ×3 IMPLANT
TOWEL GREEN STERILE FF (TOWEL DISPOSABLE) ×6 IMPLANT
TUBE CONNECTING 12'X1/4 (SUCTIONS) ×3
TUBE CONNECTING 12X1/4 (SUCTIONS) ×4 IMPLANT
TUBING ARTHROSCOPY IRRIG 16FT (MISCELLANEOUS) ×3 IMPLANT
WATER STERILE IRR 1000ML POUR (IV SOLUTION) ×3 IMPLANT

## 2020-07-18 NOTE — Progress Notes (Signed)
Pt's heart rate down to 46, fluctuating from 46 to 60's.Pt alert and oriented x4,moving all extremities. BP at baseline. O2 sat greater than 92 on room air. Notified Dr. Smith Robert of pt's low heart rate. Per Dr. Smith Robert, if patient is mentating normally, it is ok for him to be discharged from PACU.

## 2020-07-18 NOTE — Progress Notes (Signed)
Reading of MRI noted. Have posted for operative debridement tomorrow and will review findings once more with colleagues preoperatively as clinical picture does not quite fit. .  Will admit and begin antibiotics.  Altamese Hytop, MD [facility], [facility] [phone]

## 2020-07-18 NOTE — Anesthesia Preprocedure Evaluation (Addendum)
Anesthesia Evaluation  Patient identified by MRN, date of birth, ID band Patient awake    Reviewed: Allergy & Precautions, NPO status , Patient's Chart, lab work & pertinent test results  Airway Mallampati: II  TM Distance: >3 FB Neck ROM: Full    Dental  (+) Poor Dentition, Dental Advisory Given, Chipped   Pulmonary neg pulmonary ROS, former smoker,    breath sounds clear to auscultation       Cardiovascular + Peripheral Vascular Disease   Rhythm:Regular Rate:Normal     Neuro/Psych negative neurological ROS  negative psych ROS   GI/Hepatic negative GI ROS, Neg liver ROS,   Endo/Other  diabetes  Renal/GU negative Renal ROS  negative genitourinary   Musculoskeletal negative musculoskeletal ROS (+)   Abdominal Normal abdominal exam  (+)   Peds negative pediatric ROS (+)  Hematology negative hematology ROS (+)   Anesthesia Other Findings   Reproductive/Obstetrics negative OB ROS                           Anesthesia Physical Anesthesia Plan  ASA: II  Anesthesia Plan: General   Post-op Pain Management:    Induction: Intravenous  PONV Risk Score and Plan: 3 and Ondansetron, Dexamethasone and Midazolam  Airway Management Planned: LMA and Oral ETT  Additional Equipment: None  Intra-op Plan:   Post-operative Plan: Extubation in OR  Informed Consent: I have reviewed the patients History and Physical, chart, labs and discussed the procedure including the risks, benefits and alternatives for the proposed anesthesia with the patient or authorized representative who has indicated his/her understanding and acceptance.     Dental advisory given  Plan Discussed with: Anesthesiologist  Anesthesia Plan Comments:        Anesthesia Quick Evaluation

## 2020-07-18 NOTE — Anesthesia Postprocedure Evaluation (Signed)
Anesthesia Post Note  Patient: Stanley Chaney  Procedure(s) Performed: ARTHROSCOPY KNEE SYNOVECTOMY (Right Knee) IRRIGATION AND DEBRIDEMENT RIGHT KNEE (Right Knee)     Patient location during evaluation: PACU Anesthesia Type: General Level of consciousness: awake and alert Pain management: pain level controlled Vital Signs Assessment: post-procedure vital signs reviewed and stable Respiratory status: spontaneous breathing, nonlabored ventilation, respiratory function stable and patient connected to nasal cannula oxygen Cardiovascular status: blood pressure returned to baseline and stable Postop Assessment: no apparent nausea or vomiting Anesthetic complications: no   No complications documented.  Last Vitals:  Vitals:   07/18/20 2150 07/18/20 2210  BP: (!) 146/59 (!) 146/62  Pulse: (!) 56 65  Resp: 18 20  Temp:  36.4 C  SpO2: 99% 100%    Last Pain:  Vitals:   07/18/20 2210  TempSrc: Oral  PainSc: 0-No pain                 Effie Berkshire

## 2020-07-18 NOTE — Anesthesia Procedure Notes (Signed)
Procedure Name: LMA Insertion Date/Time: 07/18/2020 6:37 PM Performed by: Eligha Bridegroom, CRNA Pre-anesthesia Checklist: Patient identified, Emergency Drugs available, Suction available, Patient being monitored and Timeout performed Patient Re-evaluated:Patient Re-evaluated prior to induction Oxygen Delivery Method: Circle system utilized Preoxygenation: Pre-oxygenation with 100% oxygen Induction Type: IV induction LMA: LMA inserted LMA Size: 4.0 Number of attempts: 1 Placement Confirmation: positive ETCO2 and breath sounds checked- equal and bilateral Tube secured with: Tape Dental Injury: Teeth and Oropharynx as per pre-operative assessment

## 2020-07-18 NOTE — Procedures (Signed)
Clinician: Jari Pigg, PA-C  Specimen: 15 cc straw bloody, purulent fluid   Complications: none  Intra-articular medications: none, infiltrated skin with 5cc 1% lidocaine   Description  Verbal consent obtained from patient. Utilizing sterile technique the superolateral aspect of the Right knee was prepped with alcohol swab, followed by betadine swabs x 3.  The SQ tissue was then infiltrated with 5 cc of 1% lidocaine (plain).  After adequate anesthesia of the skin was achieved, an 18 G needle on a 30 cc syringe was then advanced into the knee joint. A palpable pop was appreciated upon entrance to the knee.  15 cc of bloody, cloudy purulent fluid was aspirated from the joint, I did feel that there was more fluid in the joint but it would not come out, suspect that there may be a partially loculated effusion present as well.  Based on the clinical appearance of the fluid, a septic joint is a high probability.  Needle removed without difficulty, dressing applied. Pt tolerated procedure well   Specimen will be sent to lab for gram stain, various cultures (including aerobic and anaerobic) given atypical presentation, fluid cell count with differential and crystal analysis.  OR this afternoon for formal I&D   Jari Pigg, PA-C [phone] (C) 07/18/2020, 12:33 PM  [facility] [place] [address] [phone] (F)

## 2020-07-18 NOTE — ED Notes (Signed)
Vital signs stable. 

## 2020-07-18 NOTE — Transfer of Care (Addendum)
Immediate Anesthesia Transfer of Care Note  Patient: Stanley Chaney  Procedure(s) Performed: ARTHROSCOPY KNEE SYNOVECTOMY (Right Knee)  Patient Location: PACU  Anesthesia Type:General  Level of Consciousness: awake and alert   Airway & Oxygen Therapy: Patient Spontanous Breathing  Post-op Assessment: Report given to RN and Post -op Vital signs reviewed and stable  Post vital signs: Reviewed and stable  Last Vitals:  Vitals Value Taken Time  BP 142/50   Temp 97.9   Pulse 65   Resp 14   SpO2 98     Last Pain:  Vitals:   07/18/20 1029  TempSrc:   PainSc: 0-No pain         Complications: No complications documented.

## 2020-07-18 NOTE — Progress Notes (Addendum)
Orthopaedic Trauma Service Progress Note  Patient ID: Stanley Chaney MRN: [id number] DOB/AGE: [birth year] 61 y.o.  Subjective:  Pt states he is doing well Remains afebrile  No elevated WBC count  No tachycardia   States knee pain is a little better   Has had only toradol  2 doses of zosyn   Clinical exam and MRI do not correlate  Still seemingly moderate to large effusion on MRI   States pain has been present for a month but denies trauma Remote history of R knee surgery (says almost 45 years ago)  Denies any history of knee pain  Denies recent travel Denies history of TB, no reported history of lyme disease  No recent illnesses   States he doesn't go to the doctor despite recorded history and medications  States he is not taking DM meds anymore, "doing my own thing" to control it   Review of Systems  Constitutional: Negative for chills, fever and weight loss.  Eyes: Negative for blurred vision and double vision.  Respiratory: Negative for shortness of breath and wheezing.   Cardiovascular: Negative for chest pain and palpitations.  Gastrointestinal: Negative for abdominal pain, nausea and vomiting.  Neurological: Negative for tingling and sensory change.      Objective:   VITALS:   Vitals:   07/18/20 0239 07/18/20 0543 07/18/20 0826 07/18/20 0952  BP: (!) 140/56 (!) 108/93 (!) 151/59 (!) 150/75  Pulse: (!) 51 64 68 65  Resp: '16 18 14 14  ' Temp:      TempSrc:      SpO2: 98% 99% 100% 100%  Weight:      Height:        Estimated body mass index is 20.09 kg/m as calculated from the following:   Height as of this encounter: '5\' 10"'  (1.778 m).   Weight as of this encounter: 63.5 kg.   Intake/Output      11/08 0701 - 11/09 0700 11/09 0701 - 11/10 0700   IV Piggyback 40.4    Total Intake(mL/kg) 40.4 (0.6)    Net +40.4           LABS  Results for orders placed or performed during  the hospital encounter of 07/17/20 (from the past 24 hour(s))  Comprehensive metabolic panel     Status: Abnormal   Collection Time: 07/17/20  4:29 PM  Result Value Ref Range   Sodium 142 135 - 145 mmol/L   Potassium 5.3 (H) 3.5 - 5.1 mmol/L   Chloride 100 98 - 111 mmol/L   CO2 28 22 - 32 mmol/L   Glucose, Bld 180 (H) 70 - 99 mg/dL   BUN 12 8 - 23 mg/dL   Creatinine, Ser 1.00 0.61 - 1.24 mg/dL   Calcium 10.1 8.9 - 10.3 mg/dL   Total Protein 7.9 6.5 - 8.1 g/dL   Albumin 2.9 (L) 3.5 - 5.0 g/dL   AST 21 15 - 41 U/L   ALT 17 0 - 44 U/L   Alkaline Phosphatase 90 38 - 126 U/L   Total Bilirubin 0.8 0.3 - 1.2 mg/dL   GFR, Estimated >60 >60 mL/min   Anion gap 14 5 - 15  CBC with Differential     Status: Abnormal   Collection Time: 07/17/20  4:29 PM  Result Value Ref Range   WBC  8.8 4.0 - 10.5 K/uL   RBC 4.67 4.22 - 5.81 MIL/uL   Hemoglobin 13.1 13.0 - 17.0 g/dL   HCT 41.6 39 - 52 %   MCV 89.1 80.0 - 100.0 fL   MCH 28.1 26.0 - 34.0 pg   MCHC 31.5 30.0 - 36.0 g/dL   RDW 11.9 11.5 - 15.5 %   Platelets 330 150 - 400 K/uL   nRBC 0.0 0.0 - 0.2 %   Neutrophils Relative % 62 %   Neutro Abs 5.6 1.7 - 7.7 K/uL   Lymphocytes Relative 20 %   Lymphs Abs 1.8 0.7 - 4.0 K/uL   Monocytes Relative 14 %   Monocytes Absolute 1.2 (H) 0.1 - 1.0 K/uL   Eosinophils Relative 2 %   Eosinophils Absolute 0.1 0.0 - 0.5 K/uL   Basophils Relative 1 %   Basophils Absolute 0.0 0.0 - 0.1 K/uL   Immature Granulocytes 1 %   Abs Immature Granulocytes 0.06 0.00 - 0.07 K/uL  C-reactive protein     Status: Abnormal   Collection Time: 07/17/20  6:00 PM  Result Value Ref Range   CRP 20.5 (H) <1.0 mg/dL  Sedimentation rate     Status: Abnormal   Collection Time: 07/17/20  6:00 PM  Result Value Ref Range   Sed Rate 111 (H) 0 - 16 mm/hr  Culture, blood (Routine x 2)     Status: None (Preliminary result)   Collection Time: 07/17/20  6:01 PM   Specimen: BLOOD RIGHT ARM  Result Value Ref Range   Specimen Description  BLOOD RIGHT ARM    Special Requests      BOTTLES DRAWN AEROBIC AND ANAEROBIC Blood Culture results may not be optimal due to an inadequate volume of blood received in culture bottles   Culture      NO GROWTH < 12 HOURS Performed at [facility], [address] [place], [place] [address]    Report Status PENDING   Culture, blood (Routine x 2)     Status: None (Preliminary result)   Collection Time: 07/17/20  6:10 PM   Specimen: BLOOD LEFT ARM  Result Value Ref Range   Specimen Description BLOOD LEFT ARM    Special Requests      BOTTLES DRAWN AEROBIC AND ANAEROBIC Blood Culture results may not be optimal due to an inadequate volume of blood received in culture bottles   Culture      NO GROWTH < 12 HOURS Performed at [facility], [place] [address], [place], [place] [address]    Report Status PENDING   Lactic acid, plasma     Status: None   Collection Time: 07/17/20  6:19 PM  Result Value Ref Range   Lactic Acid, Venous 1.8 0.5 - 1.9 mmol/L  Body fluid culture     Status: None (Preliminary result)   Collection Time: 07/17/20  7:31 PM   Specimen: Synovium; Body Fluid  Result Value Ref Range   Specimen Description SYNOVIAL FLUID KNEE    Special Requests NONE    Gram Stain      FEW WBC PRESENT,BOTH PMN AND MONONUCLEAR NO ORGANISMS SEEN    Culture      NO GROWTH < 24 HOURS Performed at [facility], [address] [place], [place] [address]    Report Status PENDING   Synovial cell count + diff, w/ crystals     Status: Abnormal   Collection Time: 07/17/20  7:31 PM  Result Value Ref  Range   Color, Synovial RED (A) YELLOW   Appearance-Synovial CLOUDY (A) CLEAR   Crystals, Fluid NO CRYSTALS SEEN    WBC, Synovial 20,350 (H) 0 - 200 /cu mm   Neutrophil, Synovial 97 (H) 0 - 25 %   Lymphocytes-Synovial Fld 1 0 - 20 %   Monocyte-Macrophage-Synovial Fluid 2 (L) 50 - 90 %   Eosinophils-Synovial 0 0 - 1 %  Lactic acid, plasma     Status: Abnormal   Collection Time:  07/18/20 12:02 AM  Result Value Ref Range   Lactic Acid, Venous 4.6 (HH) 0.5 - 1.9 mmol/L  Protime-INR     Status: None   Collection Time: 07/18/20 12:02 AM  Result Value Ref Range   Prothrombin Time 13.5 11.4 - 15.2 seconds   INR 1.1 0.8 - 1.2  Hemoglobin A1c     Status: Abnormal   Collection Time: 07/18/20 12:02 AM  Result Value Ref Range   Hgb A1c MFr Bld 7.7 (H) 4.8 - 5.6 %   Mean Plasma Glucose 174.29 mg/dL  HIV Antibody (routine testing w rflx)     Status: None   Collection Time: 07/18/20 12:02 AM  Result Value Ref Range   HIV Screen 4th Generation wRfx Non Reactive Non Reactive  Respiratory Panel by RT PCR (Flu A&B, Covid) - Nasopharyngeal Swab     Status: None   Collection Time: 07/18/20 12:24 AM   Specimen: Nasopharyngeal Swab  Result Value Ref Range   SARS Coronavirus 2 by RT PCR NEGATIVE NEGATIVE   Influenza A by PCR NEGATIVE NEGATIVE   Influenza B by PCR NEGATIVE NEGATIVE  CBC with Differential/Platelet     Status: Abnormal   Collection Time: 07/18/20  3:28 AM  Result Value Ref Range   WBC 5.9 4.0 - 10.5 K/uL   RBC 4.07 (L) 4.22 - 5.81 MIL/uL   Hemoglobin 11.6 (L) 13.0 - 17.0 g/dL   HCT 36.9 (L) 39 - 52 %   MCV 90.7 80.0 - 100.0 fL   MCH 28.5 26.0 - 34.0 pg   MCHC 31.4 30.0 - 36.0 g/dL   RDW 12.0 11.5 - 15.5 %   Platelets 291 150 - 400 K/uL   nRBC 0.0 0.0 - 0.2 %   Neutrophils Relative % 54 %   Neutro Abs 3.2 1.7 - 7.7 K/uL   Lymphocytes Relative 27 %   Lymphs Abs 1.6 0.7 - 4.0 K/uL   Monocytes Relative 15 %   Monocytes Absolute 0.9 0.1 - 1.0 K/uL   Eosinophils Relative 2 %   Eosinophils Absolute 0.1 0.0 - 0.5 K/uL   Basophils Relative 1 %   Basophils Absolute 0.0 0.0 - 0.1 K/uL   Immature Granulocytes 1 %   Abs Immature Granulocytes 0.03 0.00 - 0.07 K/uL  Comprehensive metabolic panel     Status: Abnormal   Collection Time: 07/18/20  3:28 AM  Result Value Ref Range   Sodium 140 135 - 145 mmol/L   Potassium 4.4 3.5 - 5.1 mmol/L   Chloride 100 98 - 111  mmol/L   CO2 30 22 - 32 mmol/L   Glucose, Bld 131 (H) 70 - 99 mg/dL   BUN 13 8 - 23 mg/dL   Creatinine, Ser 0.76 0.61 - 1.24 mg/dL   Calcium 9.5 8.9 - 10.3 mg/dL   Total Protein 6.9 6.5 - 8.1 g/dL   Albumin 2.5 (L) 3.5 - 5.0 g/dL   AST 13 (L) 15 - 41 U/L   ALT 16 0 - 44  U/L   Alkaline Phosphatase 75 38 - 126 U/L   Total Bilirubin 0.9 0.3 - 1.2 mg/dL   GFR, Estimated >60 >60 mL/min   Anion gap 10 5 - 15  Ethanol     Status: None   Collection Time: 07/18/20  3:28 AM  Result Value Ref Range   Alcohol, Ethyl (B) <10 <10 mg/dL     PHYSICAL EXAM:   Gen: awake, sitting up in bed, non-toxic appearing male. Moderate sarcopenia  Lungs: clear anterior fields Cardiac: regular  Abd: + BS, NTND, soft, flat Ext:       Right Lower Extremity   Moderate knee effusion persists  Distal motor and sensory functions intact  Does not really tolerate PROM or AROM of the knee. Can get to about 35 degrees of flexion   No swelling distally   No erythema or cellulitis noted, some splotchy patches noted to lower leg, dermatitis appearance   Temperature nearly symmetric to contra-lateral side  No open wounds  Old healed surgical wound superolateral R knee   No DCT  No pitting edema  No streaking erythema         Assessment/Plan: Day of Surgery   Active Problems:   Septic arthritis of knee, right (HCC)   Anti-infectives (From admission, onward)   Start     Dose/Rate Route Frequency Ordered Stop   07/18/20 0015  piperacillin-tazobactam (ZOSYN) IVPB 3.375 g        3.375 g 12.5 mL/hr over 240 Minutes Intravenous Every 8 hours 07/18/20 0009      .  POD/HD#: 1  59 y/o male with subacute presentation of atraumatic R knee pain and effusion   -R knee pain, effusion   Large differential    Septic joint---not classically behaving like a septic joint with exam and cell count.       Possible that this is an atypical organism      Labs pending      Will re-aspirate the joint to see if we can get  a larger yield of fluid. Only 5cc reported on first arthrocentesis     RA- labs pending. 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    Do think that diagnostic arthroscopy warranted at this point given obscure clinical picture   Will perform another arthrocentesis at bedside    Will send fluid for same labs in addition to path and cytology       - Pain management:  Well controlled with NSAIDs  - ABL anemia/Hemodynamics  Stable  - Medical issues   DM   a1c trending down    7.7 from 9.1 8 months ago   - DVT/PE prophylaxis:  Hold for now   - ID:   Zosyn   - Dispo:  Likely OR later this pm   Keep Brooks, PA-C [phone] (C) 07/18/2020, 11:08 AM  [facility] [place] [address] [phone] Jenetta [phone] (F)    After 5pm and on the weekends please log on to [facility], go to orthopaedics and the look under the [facility] Call for the provider(s) on call. You can also call our office at [phone] and then follow the prompts to be connected to the  call team.

## 2020-07-19 ENCOUNTER — Other Ambulatory Visit: Payer: Self-pay

## 2020-07-19 ENCOUNTER — Encounter (HOSPITAL_COMMUNITY): Payer: Self-pay | Admitting: Orthopedic Surgery

## 2020-07-19 DIAGNOSIS — M1711 Unilateral primary osteoarthritis, right knee: Secondary | ICD-10-CM

## 2020-07-19 HISTORY — DX: Unilateral primary osteoarthritis, right knee: M17.11

## 2020-07-19 LAB — CBC WITH DIFFERENTIAL/PLATELET
Abs Immature Granulocytes: 0.03 10*3/uL (ref 0.00–0.07)
Basophils Absolute: 0 10*3/uL (ref 0.0–0.1)
Basophils Relative: 1 %
Eosinophils Absolute: 0.1 10*3/uL (ref 0.0–0.5)
Eosinophils Relative: 2 %
HCT: 37 % — ABNORMAL LOW (ref 39.0–52.0)
Hemoglobin: 11.6 g/dL — ABNORMAL LOW (ref 13.0–17.0)
Immature Granulocytes: 1 %
Lymphocytes Relative: 22 %
Lymphs Abs: 1.4 10*3/uL (ref 0.7–4.0)
MCH: 28.2 pg (ref 26.0–34.0)
MCHC: 31.4 g/dL (ref 30.0–36.0)
MCV: 89.8 fL (ref 80.0–100.0)
Monocytes Absolute: 0.9 10*3/uL (ref 0.1–1.0)
Monocytes Relative: 15 %
Neutro Abs: 3.8 10*3/uL (ref 1.7–7.7)
Neutrophils Relative %: 59 %
Platelets: 305 10*3/uL (ref 150–400)
RBC: 4.12 MIL/uL — ABNORMAL LOW (ref 4.22–5.81)
RDW: 11.9 % (ref 11.5–15.5)
WBC: 6.3 10*3/uL (ref 4.0–10.5)
nRBC: 0 % (ref 0.0–0.2)

## 2020-07-19 LAB — BASIC METABOLIC PANEL
Anion gap: 12 (ref 5–15)
BUN: 9 mg/dL (ref 8–23)
CO2: 29 mmol/L (ref 22–32)
Calcium: 9.1 mg/dL (ref 8.9–10.3)
Chloride: 101 mmol/L (ref 98–111)
Creatinine, Ser: 0.74 mg/dL (ref 0.61–1.24)
GFR, Estimated: >60 mL/min (ref >60)
Glucose, Bld: 83 mg/dL (ref 70–99)
Potassium: 4.1 mmol/L (ref 3.5–5.1)
Sodium: 142 mmol/L (ref 135–145)

## 2020-07-19 LAB — ANA W/REFLEX IF POSITIVE: Anti Nuclear Antibody (ANA): NEGATIVE

## 2020-07-19 LAB — GLUCOSE, CAPILLARY
Glucose-Capillary: 86 mg/dL (ref 70–99)
Glucose-Capillary: 121 mg/dL — ABNORMAL HIGH (ref 70–99)
Glucose-Capillary: 82 mg/dL (ref 70–99)
Glucose-Capillary: 156 mg/dL — ABNORMAL HIGH (ref 70–99)

## 2020-07-19 LAB — HEPATITIS PANEL, ACUTE
HCV Ab: NONREACTIVE
Hep A IgM: NONREACTIVE
Hep B C IgM: NONREACTIVE
Hepatitis B Surface Ag: NONREACTIVE

## 2020-07-19 LAB — ACID FAST SMEAR (AFB, MYCOBACTERIA)
Acid Fast Smear: NEGATIVE
Acid Fast Smear: NEGATIVE

## 2020-07-19 LAB — RHEUMATOID FACTOR: Rheumatoid fact SerPl-aCnc: 31.5 IU/mL — ABNORMAL HIGH (ref 0.0–13.9)

## 2020-07-19 NOTE — Plan of Care (Signed)

## 2020-07-19 NOTE — Evaluation (Signed)
Physical Therapy Evaluation Patient Details Name: Stanley Chaney MRN: [id number] DOB: 1961/01/17 Today's Date: 07/19/2020   History of Present Illness  Pt is a 59 y.o. male admitted 07/17/20 with c/o 4 wks of R knee pain. 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 S/p R knee arthrocentesis 11/9. PMH includes PAD, DM.  Clinical Impression   Patient is s/p above surgery resulting in functional limitations due to the deficits listed below (see PT Problem List). Comes from home where he lives with his son and family, although my understanding is that he has been staying recently at a hotel so that he does not have to negotiate stairs to get to his bedroom; Has recently been using a RW due to knee pain which lead to this admission; Presents to PT with R knee pain and swelling (though he reports is much improved postop), which is effecting his gait and mobility;  Patient will benefit from skilled PT to increase their independence and safety with mobility to allow discharge to the venue listed below.    While Garris was more than clear with me that he is not interested in practicing stairs, and that he is perfectly willing to go back to the motel where he was recently staying, S. Dixon, NP with Infectious Disease brings up a good point about perhaps back to his son's home is better, esp considering feasibility of in-home antibiotics; Given his performance today and WBAT status, I anticipate stairs will not be as difficult as before at this time -- have set a stair goal for ascending/descending flight of steps.     Follow Up Recommendations Home health PT;Supervision - Intermittent    Equipment Recommendations  3in1 (PT)    Recommendations for Other Services OT consult     Precautions / Restrictions Precautions Precautions: Knee Precaution Comments: Demonstrated that at rest it will be  best for him to prop his ankle up to get full extension of the knee; I anticiate he will need reinforcement because his response was: "What are you training me for? Gymnastics?"; I assured him that full knee extension range is important for normal walking Restrictions RLE Weight Bearing: Weight bearing as tolerated      Mobility  Bed Mobility Overal bed mobility: Needs Assistance Bed Mobility: Supine to Sit     Supine to sit: Min assist     General bed mobility comments: Incr time and he tends to use hands to assist RLE off of the bed; VCs for safety as pt scooting hips very close to EOB prior to swinging LEs over edge - reports this is how he's been managing at home     Transfers Overall transfer level: Needs assistance Equipment used: Rolling walker (2 wheeled) Transfers: Sit to/from Stand Sit to Stand: Min guard         General transfer comment: VCs for safe hand placement with assist to rise and stabilize during transition of UE to RW. VCs for safety and assist for safe descent to chair as pt attempting to sit prior to fully backing up to chair (pt attempting to ensure RLE remains mostly extended given pain); stood from bed and hallway bench; needs reinforcement to fully back up to where he will sit, then position RLE in front for comfort as he goes to sit  Ambulation/Gait Ambulation/Gait assistance: Min guard;Supervision (Minguard progressing to supervision) Gait Distance (Feet): 120 Feet (x2 with seated rest halfway) Assistive  device: Rolling walker (2 wheeled) Gait Pattern/deviations: Step-to pattern;Step-through pattern (step-through emerging) Gait velocity: slow   General Gait Details: Overall good use of RW for balance and steadying, and I believe his fall risk is low (his fall risk score is under 10); cues to accept weight to tolerance on RLE, and to work on fully extending R knee in stance  Stairs         General stair comments: Pt specifically addressed stairs,  stating going up and down stairs is not necessary to go home/leave the hospital, and he is not interested in practicing stairs     Wheelchair Mobility    Modified Rankin (Stroke Patients Only)       Balance     Sitting balance-Leahy Scale: Fair       Standing balance-Leahy Scale: Poor Standing balance comment: reliant on UE support at this time                              Pertinent Vitals/Pain Pain Assessment: Faces Faces Pain Scale: Hurts even more Pain Location: R knee Pain Descriptors / Indicators: Operative site guarding;Discomfort;Sore Pain Intervention(s): Monitored during session    Home Living Family/patient expects to be discharged to:: Private residence Living Arrangements: Children Available Help at Discharge: Family Type of Home: House Home Access: Stairs to enter Entrance Stairs-Rails: None Entrance Stairs-Number of Steps: 2 Home Layout: Two level (reports can stay on first floor but no bedroom (couch?)Has been staying at a motel recently to avoid stairs) Home Equipment: Walker - 2 wheels Additional Comments: pt reports PTA he was staying in a motel close to his son's house because he couldn't get to the second story. states he can likely stay with son at d/c, or is willing to return to the hotel    Prior Function Level of Independence: Needs assistance   Gait / Transfers Assistance Needed: reports using RW  ADL's / Homemaking Assistance Needed: son assists with bathing and LB ADL        Hand Dominance        Extremity/Trunk Assessment   Upper Extremity Assessment Upper Extremity Assessment: Overall WFL for tasks assessed    Lower Extremity Assessment Lower Extremity Assessment: RLE deficits/detail RLE Deficits / Details: R knee swollen and painful; AROM limited to approx 5deg shy of full extension with full knee extension effort; tends to keep knee at 15-20 deg of flexion at rest; Able to flex to approx 60 deg flexion before onset  of intense pain; Able to bear weight in standing, walking -- and he seems encouraged by this RLE: Unable to fully assess due to pain    Cervical / Trunk Assessment Cervical / Trunk Assessment: Normal  Communication   Communication: Prefers language other than English (ipad interpreter used, Alroy Dust [phone])  Cognition Arousal/Alertness: Awake/alert Behavior During Therapy: WFL for tasks assessed/performed Overall Cognitive Status: Within Functional Limits for tasks assessed                                        General Comments General comments (skin integrity, edema, etc.): Lengthy discussion with pt, nurse tech re: how much he wants to be able to get up and move in his room without steeing off alarms; ultimately, we practiced wlaking in the room with the IV pole plugged in between the bed and the window, to  give enough radius to allow for pt to walk to wndow and back to bed within the radius of tubing so that he can wlak modest distance in the room modified independently without having to manage moving the IV pole in addition to the RW; Gem, NT came in to watch, and we established that he can walk within that specific radius with the RW     Exercises Total Joint Exercises Quad Sets: AROM;Right;5 reps Heel Slides: AAROM;Right;5 reps Goniometric ROM: approx 5-65deg   Assessment/Plan    PT Assessment Patient needs continued PT services  PT Problem List Decreased strength;Decreased range of motion;Decreased activity tolerance;Decreased balance;Decreased mobility;Decreased knowledge of use of DME;Decreased knowledge of precautions;Pain       PT Treatment Interventions DME instruction;Gait training;Stair training;Functional mobility training;Therapeutic activities;Therapeutic exercise;Patient/family education    PT Goals (Current goals can be found in the Care Plan section)  Acute Rehab PT Goals Patient Stated Goal: Be able to walk without pain; would also like to be  able to get up and move in the room without setting off alarms PT Goal Formulation: With patient Time For Goal Achievement: 08/02/20 Potential to Achieve Goals: Good Additional Goals Additional Goal #1: Pt will ascend/descend a flight of steps with rail and Supervision assist    Frequency Min 5X/week   Barriers to discharge        Co-evaluation               AM-PAC PT "6 Clicks" Mobility  Outcome Measure Help needed turning from your back to your side while in a flat bed without using bedrails?: None Help needed moving from lying on your back to sitting on the side of a flat bed without using bedrails?: None Help needed moving to and from a bed to a chair (including a wheelchair)?: A Little Help needed standing up from a chair using your arms (e.g., wheelchair or bedside chair)?: None Help needed to walk in hospital room?: None Help needed climbing 3-5 steps with a railing? : A Little 6 Click Score: 22    End of Session Equipment Utilized During Treatment: Gait belt Activity Tolerance: Patient tolerated treatment well Patient left: in bed;with call bell/phone within reach Nurse Communication: Mobility status;Other (comment) (options for him to be a independent as possible in the room) PT Visit Diagnosis: Other abnormalities of gait and mobility (R26.89);Muscle weakness (generalized) (M62.81);Pain Pain - Right/Left: Right Pain - part of body: Knee    Time: 1324-1430 PT Time Calculation (min) (ACUTE ONLY): 66 min   Charges:   PT Evaluation $PT Eval Moderate Complexity: 1 Mod PT Treatments $Gait Training: 23-37 mins $Therapeutic Activity: 8-22 mins        Roney Marion, PT  Acute Rehabilitation Services Pager [phone] Office [place] 07/19/2020, 5:05 PM

## 2020-07-19 NOTE — Evaluation (Signed)
Occupational Therapy Evaluation Patient Details Name: Stanley Chaney MRN: [id number] DOB: 01-Oct-1960 Today's Date: 07/19/2020    History of Present Illness Pt is a 59 y.o. male admitted 07/17/20 with c/o 4 wks of R knee pain. 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 S/p R knee arthrocentesis 11/9. PMH includes PAD, DM.   Clinical Impression   This 59 y/o male presents with the above. PTA pt reports using RW for mobility, receiving intermittent assist from his son for ADL. ?pt does endorse staying in a motel as of recent close to his son's home, given R knee pain and unable to access second story of home. He reports likely able to stay with son at time of discharge. Today pt performing room level mobility using RW initially with minA progressed to close minguard assist and given min cues for safety. Pt with tendency to maintain NWB in RLE but intermittently taking some weight through LE - encouraged to continue doing so as able to tolerate. He requires up to [facility] for LB ADL given RLE limitations. Pt to benefit from continued acute OT services, currently recommend follow up [facility] services after discharge to maximize his overall safety and independence with ADL and mobility.     Follow Up Recommendations  Home health OT;Supervision/Assistance - 24 hour    Equipment Recommendations  3 in 1 bedside commode           Precautions / Restrictions Precautions Precautions: Fall Restrictions Weight Bearing Restrictions: Yes RLE Weight Bearing: Weight bearing as tolerated      Mobility Bed Mobility Overal bed mobility: Needs Assistance Bed Mobility: Supine to Sit     Supine to sit: Min assist     General bed mobility comments: assist for RLE management; VCs for safety as pt scooting hips very close to EOB prior to swinging LEs over edge - reports this is how he's been managing  at home     Transfers Overall transfer level: Needs assistance Equipment used: Rolling walker (2 wheeled) Transfers: Sit to/from Stand Sit to Stand: Min assist         General transfer comment: VCs for safe hand placement with assist to rise and stabilize during transition of UE to RW. VCs for safety and assist for safe descent to chair as pt attempting to sit prior to fully backing up to chair (pt attempting to ensure RLE remains mostly extended given pain)    Balance Overall balance assessment: Needs assistance Sitting-balance support: Feet supported Sitting balance-Leahy Scale: Fair     Standing balance support: Bilateral upper extremity supported Standing balance-Leahy Scale: Poor Standing balance comment: reliant on UE support at this time                            ADL either performed or assessed with clinical judgement   ADL Overall ADL's : Needs assistance/impaired Eating/Feeding: Modified independent;Sitting   Grooming: Set up;Sitting   Upper Body Bathing: Supervision/ safety;Sitting   Lower Body Bathing: Moderate assistance;Sit to/from stand   Upper Body Dressing : Sitting;Supervision/safety   Lower Body Dressing: Moderate assistance;Sit to/from stand Lower Body Dressing Details (indicate cue type and reason): assist for donning R sock, minguard static balance with UE suport of RW Toilet Transfer: Minimal assistance;Min guard;Ambulation;RW Toilet Transfer Details (indicate cue type and reason): simulated via transfer to recliner, room level mobility; minA progressed to minguard  Toileting- Clothing Manipulation and Hygiene: Minimal assistance;Sit to/from stand       Functional mobility during ADLs: Minimal assistance;Rolling walker (progressed to minguard)                           Pertinent Vitals/Pain Pain Assessment: Faces Faces Pain Scale: Hurts even more Pain Location: R knee Pain Descriptors / Indicators: Operative site  guarding;Discomfort;Sore Pain Intervention(s): Monitored during session;Repositioned     Hand Dominance     Extremity/Trunk Assessment Upper Extremity Assessment Upper Extremity Assessment: Overall WFL for tasks assessed   Lower Extremity Assessment Lower Extremity Assessment: Defer to PT evaluation       Communication Communication Communication: Prefers language other than English (ipad interpreter used, Curt Jews [phone])   Cognition Arousal/Alertness: Awake/alert Behavior During Therapy: WFL for tasks assessed/performed                                   General Comments: difficult to fully assess given pt's primary language is [place], understands some [place]. requires intermittent safety cues but overall appears [facility]   General Comments       Exercises     Shoulder Instructions      Home Living Family/patient expects to be discharged to:: Private residence Living Arrangements: Children (son, daughter-in-law, grandchildren) Available Help at Discharge: Family Type of Home: House Home Access: Stairs to enter Technical brewer of Steps: 2 Entrance Stairs-Rails: None Home Layout: Two level (reports can stay on first floor but no bedroom (couch?))     Bathroom Shower/Tub: Walk-in shower;Tub only   Bathroom Toilet: Standard     Home Equipment: Walker - 2 wheels   Additional Comments: pt reports PTA he was staying in a motel close to his son's house because he couldn't get to the second story. states he can likely stay with son at d/c      Prior Functioning/Environment Level of Independence: Needs assistance  Gait / Transfers Assistance Needed: reports using RW ADL's / Homemaking Assistance Needed: son assists with bathing and LB ADL            OT Problem List: Decreased strength;Decreased range of motion;Decreased activity tolerance;Impaired balance (sitting and/or standing);Decreased safety awareness;Decreased knowledge of use of DME or  AE;Decreased knowledge of precautions;Pain      OT Treatment/Interventions: Self-care/ADL training;Therapeutic exercise;Energy conservation;DME and/or AE instruction;Therapeutic activities;Patient/family education;Balance training    OT Goals(Current goals can be found in the care plan section) Acute Rehab OT Goals Patient Stated Goal: home when able OT Goal Formulation: With patient Time For Goal Achievement: 08/02/20 Potential to Achieve Goals: Good  OT Frequency: Min 2X/week   Barriers to D/C:            Co-evaluation              AM-PAC OT "6 Clicks" Daily Activity     Outcome Measure Help from another person eating meals?: None Help from another person taking care of personal grooming?: A Little Help from another person toileting, which includes using toliet, bedpan, or urinal?: A Lot Help from another person bathing (including washing, rinsing, drying)?: A Lot Help from another person to put on and taking off regular upper body clothing?: A Little Help from another person to put on and taking off regular lower body clothing?: A Lot 6 Click Score: 16   End of Session Equipment Utilized During Treatment: Gait belt;Rolling walker  Nurse Communication: Mobility status  Activity Tolerance: Patient tolerated treatment well Patient left: in chair;with call bell/phone within reach;with chair alarm set  OT Visit Diagnosis: Other abnormalities of gait and mobility (R26.89);Pain Pain - Right/Left: Right Pain - part of body: Knee                Time: 3754-3606 OT Time Calculation (min): 38 min Charges:  OT General Charges $OT Visit: 1 Visit OT Evaluation $OT Eval Moderate Complexity: 1 Mod OT Treatments $Self Care/Home Management : 23-37 mins  Lou Cal, OT Acute Rehabilitation Services Pager [phone] Office [phone]   Raymondo Band 07/19/2020, 1:12 PM

## 2020-07-19 NOTE — Progress Notes (Signed)
Orthopaedic Trauma Service Progress Note  Patient ID: Shean Gerding MRN: [id number] DOB/AGE: 11-21-1960 59 y.o.  Subjective:  Doing ok this am  Pain tolerable   No other specific complaints   (see 07/18/2020 note for additional history)  Denies IVDU   Initial synovial fluid specimen growing rare pseudomonas aeruginosa   Intra-op knee really did not show evidence of acute/active infection. Moderate synovitis with severe degenerative changes with bone eburnation. Changes seemed to be acute on chronic    ROS As above  Objective:   VITALS:   Vitals:   07/18/20 2150 07/18/20 2210 07/18/20 2311 07/19/20 0300  BP: (!) 146/59 (!) 146/62 (!) 158/58 (!) 168/68  Pulse: (!) 56 65 63 73  Resp: 18 20 16 18   Temp:  97.6 F (36.4 C) (!) 97.1 F (36.2 C) 98 F (36.7 C)  TempSrc:  Oral Oral Oral  SpO2: 99% 100%  100%  Weight:      Height:        Estimated body mass index is 20.09 kg/m as calculated from the following:   Height as of this encounter: 5\' 10"  (1.778 m).   Weight as of this encounter: 63.5 kg.   Intake/Output      11/09 0701 - 11/10 0700 11/10 0701 - 11/11 0700   P.O.  240   I.V. (mL/kg) 2326.4 (36.6)    IV Piggyback 109.6    Total Intake(mL/kg) 2436 (38.4) 240 (3.8)   Urine (mL/kg/hr) 400 (0.3) 400 (1.4)   Blood 10    Total Output 410 400   Net +2026 -160          LABS  Results for orders placed or performed during the hospital encounter of 07/17/20 (from the past 24 hour(s))  Aerobic/Anaerobic Culture (surgical/deep wound)     Status: None (Preliminary result)   Collection Time: 07/18/20 12:02 PM   Specimen: Synovium  Result Value Ref Range   Specimen Description SYNOVIAL FLUID    Special Requests RIGHT KNEE    Gram Stain      ABUNDANT WBC PRESENT,BOTH PMN AND MONONUCLEAR NO ORGANISMS SEEN    Culture      CULTURE REINCUBATED FOR BETTER GROWTH Performed at [facility], [place] [address] [place], [place] [address]    Report Status PENDING   CBG monitoring, ED     Status: Abnormal   Collection Time: 07/18/20 12:04 PM  Result Value Ref Range   Glucose-Capillary 102 (H) 70 - 99 mg/dL  Urinalysis, Routine w reflex microscopic Urine, Clean Catch     Status: Abnormal   Collection Time: 07/18/20 12:48 PM  Result Value Ref Range   Color, Urine YELLOW YELLOW   APPearance CLEAR CLEAR   Specific Gravity, Urine 1.020 1.005 - 1.030   pH 5.0 5.0 - 8.0   Glucose, UA NEGATIVE NEGATIVE mg/dL   Hgb urine dipstick NEGATIVE NEGATIVE   Bilirubin Urine NEGATIVE NEGATIVE   Ketones, ur 5 (A) NEGATIVE mg/dL   Protein, ur NEGATIVE NEGATIVE mg/dL   Nitrite NEGATIVE NEGATIVE   Leukocytes,Ua NEGATIVE NEGATIVE  Urine rapid drug screen (hosp performed)     Status: None   Collection Time: 07/18/20 12:48 PM  Result Value Ref Range   Opiates NONE DETECTED NONE DETECTED   Cocaine NONE DETECTED NONE DETECTED   Benzodiazepines NONE DETECTED  NONE DETECTED   Amphetamines NONE DETECTED NONE DETECTED   Tetrahydrocannabinol NONE DETECTED NONE DETECTED   Barbiturates NONE DETECTED NONE DETECTED  CBG monitoring, ED     Status: Abnormal   Collection Time: 07/18/20  3:01 PM  Result Value Ref Range   Glucose-Capillary 101 (H) 70 - 99 mg/dL  Glucose, capillary     Status: None   Collection Time: 07/18/20  5:49 PM  Result Value Ref Range   Glucose-Capillary 99 70 - 99 mg/dL  Glucose, capillary     Status: Abnormal   Collection Time: 07/18/20  8:41 PM  Result Value Ref Range   Glucose-Capillary 100 (H) 70 - 99 mg/dL  Basic metabolic panel     Status: None   Collection Time: 07/19/20  2:21 AM  Result Value Ref Range   Sodium 142 135 - 145 mmol/L   Potassium 4.1 3.5 - 5.1 mmol/L   Chloride 101 98 - 111 mmol/L   CO2 29 22 - 32 mmol/L   Glucose, Bld 83 70 - 99 mg/dL   BUN 9 8 - 23 mg/dL   Creatinine, Ser 0.74 0.61 - 1.24 mg/dL   Calcium 9.1 8.9 - 10.3 mg/dL   GFR, Estimated  >60 >60 mL/min   Anion gap 12 5 - 15  Glucose, capillary     Status: None   Collection Time: 07/19/20  6:30 AM  Result Value Ref Range   Glucose-Capillary 86 70 - 99 mg/dL  CBC with Differential/Platelet     Status: Abnormal   Collection Time: 07/19/20 10:20 AM  Result Value Ref Range   WBC 6.3 4.0 - 10.5 K/uL   RBC 4.12 (L) 4.22 - 5.81 MIL/uL   Hemoglobin 11.6 (L) 13.0 - 17.0 g/dL   HCT 37.0 (L) 39 - 52 %   MCV 89.8 80.0 - 100.0 fL   MCH 28.2 26.0 - 34.0 pg   MCHC 31.4 30.0 - 36.0 g/dL   RDW 11.9 11.5 - 15.5 %   Platelets 305 150 - 400 K/uL   nRBC 0.0 0.0 - 0.2 %   Neutrophils Relative % 59 %   Neutro Abs 3.8 1.7 - 7.7 K/uL   Lymphocytes Relative 22 %   Lymphs Abs 1.4 0.7 - 4.0 K/uL   Monocytes Relative 15 %   Monocytes Absolute 0.9 0.1 - 1.0 K/uL   Eosinophils Relative 2 %   Eosinophils Absolute 0.1 0.0 - 0.5 K/uL   Basophils Relative 1 %   Basophils Absolute 0.0 0.0 - 0.1 K/uL   Immature Granulocytes 1 %   Abs Immature Granulocytes 0.03 0.00 - 0.07 K/uL     PHYSICAL EXAM:   Gen: awake, resting comfortably in bed, NAD. Moderate sarcopenia  Lungs: unlabored  Cardiac: RRR Abd: + BS, NTND Ext:       Right lower extremity   Dressing and compressive wraps c/d/i  Ext warm   No swelling distally   Motor and sensory functions intact  Ext cool but + DP pulse    Assessment/Plan: 1 Day Post-Op   Principal Problem:   Septic arthritis of knee, right (HCC) Active Problems:   Diabetes mellitus (HCC)   Arthritis of right knee   Anti-infectives (From admission, onward)   Start     Dose/Rate Route Frequency Ordered Stop   07/18/20 0015  piperacillin-tazobactam (ZOSYN) IVPB 3.375 g        3.375 g 12.5 mL/hr over 240 Minutes Intravenous Every 8 hours 07/18/20 0009      .  POD/HD#: 1  59 y/o male with 4 week history of R knee pain, loss of knee motion, effusion and inability to bear weight    - chronic R knee arthritis with ? Superimposed septic joint               intra-op the knee did not look actively infected. No purulence really encountered   Initial arthrocentesis growing pseudomonas---> atypical organism for septic knee arthritis which may explain his unusual presentation    Second arthrocentesis pending    Do feel that there is some baseline pathology other than a septic joint given the intra-op appearance of his joint.  He has endstage arthritis findings and will likely need a TKA in the near future.  Given this I do think with is wise to be aggressive with abx treatment. Will consult ID    Additional labs pending including Rheumatologic labs    Again no recent trauma to knee, no recent travel, no recent dental or medical procedures   - Pain management:  Current regimen effective   - ABL anemia/Hemodynamics  Stable  - Medical issues   DM   CBGs look good    SSI  - DVT/PE prophylaxis:  lovenox while inpatient   - ID:   On zosyn currently   - Metabolic Bone Disease:  Labs pending   - Activity:  Up with therapies   WBAT R leg   - FEN/GI prophylaxis/Foley/Lines:  Carb mod diet  Continue with IVF    - Dispo:  Therapy evals  Follow cultures   ID consult    Jari Pigg, PA-C [phone] (C) 07/19/2020, 11:28 AM  [facility] [place] [address] [phone] Jenetta [phone] (F)    After 5pm and on the weekends please log on to Amion, go to orthopaedics and the look under the [facility] Call for the provider(s) on call. You can also call our office at [phone] and then follow the prompts to be connected to the call team.

## 2020-07-19 NOTE — Progress Notes (Addendum)
I have seen and examined the patient. I have personally reviewed the clinical findings, laboratory findings, microbiological data and imaging studies. The assessment and treatment plan was discussed with the  Advance Practice Provider, Janene Madeira. I agree with her/his assessment/plan except following additions/corrections.  35 Y O Male originally from [place] with a remote h/o fracture of ? Rt knee at the age of 59 + bone infection( per patient) including DM, PAD and arthritis  presented with complaints of Rt leg pain/swelling and stiffness for 5+weeks.Chills + Repeat Synovial Fluid analysis at OR along with OR findings s/o septic arthritis with loculated effusion. 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 ESR and CRP remarkable high. Denies any IVDU, trauma, prior joint injections. No recent travel.  Altthough  not a common organism for septic arthritis esp given no associated risk factors, it does occur. Given his prior h/o Rt knee fracture and bone infection as a child with MRI findings s/o Osteomyelitis, he will need to be treated for 6 weeks.   Continue Pip/tazo for now, Will wait for OR cx to finalize along with sensitivities and make final recommendations  Monitor CBC and CMP while on IV abx. Weekly CRP and ESR  Rosiland Oz, MD [facility]    Date of Admission:  07/17/2020     Total days of antibiotics 2 piperacillin-tazobactam                Reason for Consult: Right knee infection     Referring Provider: Lattie Haw  Primary Care Provider: Wendie Agreste, MD   Assessment: Stanley Chaney is a 59 y.o. male with history of 5 week subacute swelling, stiffness and pain of the right knee now POD1 s/p arthroscopic I&D. Initial cell count from arthrocentesis 20,350 (97% neutrophils) now growing pseudmonas,  susceptibilities pending. OR cell count repeated 2d later 44,000 (98% neutrophils) that was more turbid appearing. Intraoperative findings revealed inflamed synovium and end stage wear from arthritis, but no frank signs of infection that I think is a feature of the organism. It is not clear as to how he acquired this infection with this organism; his diabetes is not completely out of control (A1C 7.7%) and no recent penetrating injuries to the knee. ESR and CRP are pretty elevated also. There is some questionable changes to areas of bone concerning for osteomyelitis on MRI. Given he will likely need a total knee joint in the future would treat this aggressively with 6 weeks of therapy - he can continue on piperacillin-tazobactam for now and will follow for susceptibilities.   There is some mentioning that he may be in a hotel/motel currently? Will talk with him and involve his son tomorrow for plans regarding feasibility of in home IV antibiotics. PT recommending home health therapy only with supervision.      Plan: 1. Continue IV piperacillin-tazobactam 2. Follow susceptibilties    Principal Problem:   Septic arthritis of knee, right (HCC) Active Problems:   Diabetes mellitus ([place])   Arthritis of right knee   . acetaminophen  500 mg Oral Q12H  . docusate sodium  100 mg Oral BID  . enoxaparin (LOVENOX) injection  40 mg Subcutaneous Q24H  . insulin aspart  0-15 Units Subcutaneous TID WC  . ketorolac  15 mg Intravenous Q8H  . lidocaine (PF)  30 mL Intradermal Once  HPI: Stanley Chaney is a 59 y.o. male admitted from home for evaluation of subacute swelling and pain involving the right knee.   He states that he first noted pain 5 weeks ago after playing with his grand kids and teaching them how to ride bikes. No mechanism of injury at all to explain. He states that eventually it became so stiff it would "get stuck" and was very challenging to walk.  He has had some chills over this  time frame but no measured fevers.  He has not been taking any antibiotics currently - mentioned possibly some antibiotics for "tooth pain" 5 weeks ago but was not clear if it was pain relief or antibiotics.   He denies any injection drug use. Stopped drinking after dx of diabetes (now diet controlled). Smokes cigarettes but decreased to quit recently. He does state he "broke his knee" at the age of 59 with penetrating injury from what it sounds and was treated "with weekly strong shots for infection."  Originally from [place], he has been living in [place] for the last 23 years. No other travel elsewhere aside from some occasional trips to the beach.  No intraarticular injections to either knee. His left knee and all other joints are completely benign.    Review of Systems: Review of Systems  Constitutional: Positive for chills. Negative for fever.  HENT: Negative for tinnitus.   Eyes: Negative for blurred vision and photophobia.  Respiratory: Negative for cough and sputum production.   Cardiovascular: Negative for chest pain.  Gastrointestinal: Negative for diarrhea, nausea and vomiting.  Genitourinary: Negative for dysuria.  Musculoskeletal: Positive for joint pain.  Skin: Negative for rash.  Neurological: Negative for headaches.     Past Medical History:  Diagnosis Date  . Arthritis of right knee 07/19/2020  . Diabetes mellitus without complication ([place])   . PAD (peripheral artery disease) (HCC)     Social History   Tobacco Use  . Smoking status: Former Smoker    Quit date: 06/14/2018    Years since quitting: 2.0  . Smokeless tobacco: Never Used  . Tobacco comment: pt reports quitting 6 months ago  Vaping Use  . Vaping Use: Never used  Substance Use Topics  . Alcohol use: Yes    Alcohol/week: 18.0 standard drinks    Types: 18 Cans of beer per week    Comment: pt reports not drinking for past 6 months  . Drug use: Never    Family History  Problem Relation Age of  Onset  . Diabetes Mother   . Cancer Mother    No Known Allergies   OBJECTIVE: Blood pressure (!) 168/68, pulse 73, temperature 98 F (36.7 C), temperature source Oral, resp. rate 18, height '5\' 10"'  (1.778 m), weight 63.5 kg, SpO2 100 %.  Physical Exam Nursing note reviewed.  Constitutional:      Comments: Resting comfortably in bed.   HENT:     Mouth/Throat:     Mouth: No oral lesions.     Dentition: Normal dentition. No dental caries.  Eyes:     General: No scleral icterus. Cardiovascular:     Rate and Rhythm: Normal rate and regular rhythm.     Heart sounds: Normal heart sounds.  Pulmonary:     Effort: Pulmonary effort is normal.     Breath sounds: Normal breath sounds.  Abdominal:     General: There is no distension.     Palpations: Abdomen is soft.     Tenderness: There is  no abdominal tenderness.  Musculoskeletal:     Comments: R knee wrapped in clean / dry dressing material.   Lymphadenopathy:     Cervical: No cervical adenopathy.  Skin:    General: Skin is warm and dry.     Findings: No rash.  Neurological:     Mental Status: He is alert and oriented to person, place, and time.     Lab Results Lab Results  Component Value Date   WBC 6.3 07/19/2020   HGB 11.6 (L) 07/19/2020   HCT 37.0 (L) 07/19/2020   MCV 89.8 07/19/2020   PLT 305 07/19/2020    Lab Results  Component Value Date   CREATININE 0.74 07/19/2020   BUN 9 07/19/2020   NA 142 07/19/2020   K 4.1 07/19/2020   CL 101 07/19/2020   CO2 29 07/19/2020    Lab Results  Component Value Date   ALT 16 07/18/2020   AST 13 (L) 07/18/2020   ALKPHOS 75 07/18/2020   BILITOT 0.9 07/18/2020     Microbiology: Recent Results (from the past 240 hour(s))  Culture, blood (Routine x 2)     Status: None (Preliminary result)   Collection Time: 07/17/20  6:01 PM   Specimen: BLOOD RIGHT ARM  Result Value Ref Range Status   Specimen Description BLOOD RIGHT ARM  Final   Special Requests   Final    BOTTLES  DRAWN AEROBIC AND ANAEROBIC Blood Culture results may not be optimal due to an inadequate volume of blood received in culture bottles   Culture   Final    NO GROWTH 2 DAYS Performed at [facility], [place] [address] [place], [place] [address]    Report Status PENDING  Incomplete  Culture, blood (Routine x 2)     Status: None (Preliminary result)   Collection Time: 07/17/20  6:10 PM   Specimen: BLOOD LEFT ARM  Result Value Ref Range Status   Specimen Description BLOOD LEFT ARM  Final   Special Requests   Final    BOTTLES DRAWN AEROBIC AND ANAEROBIC Blood Culture results may not be optimal due to an inadequate volume of blood received in culture bottles   Culture   Final    NO GROWTH 2 DAYS Performed at [facility], [place] [address], [place], [place] [address]    Report Status PENDING  Incomplete  Body fluid culture     Status: None (Preliminary result)   Collection Time: 07/17/20  7:31 PM   Specimen: Synovium; Body Fluid  Result Value Ref Range Status   Specimen Description SYNOVIAL FLUID KNEE  Final   Special Requests NONE  Final   Gram Stain   Final    FEW WBC PRESENT,BOTH PMN AND MONONUCLEAR NO ORGANISMS SEEN    Culture   Final    RARE PSEUDOMONAS AERUGINOSA SUSCEPTIBILITIES TO FOLLOW CRITICAL RESULT CALLED TO, READ BACK BY AND VERIFIED WITH: RN J.LION AT 1140 ON 07/18/2020 BY T.SAAD Performed at [facility], [place] [address], [place], [place] [address]    Report Status PENDING  Incomplete  Acid Fast Smear (AFB)     Status: None   Collection Time: 07/17/20  7:31 PM   Specimen: Synovial, Right Knee; Synovial Fluid  Result Value Ref Range Status   AFB Specimen Processing Concentration  Final   Acid Fast Smear Negative  Final    Comment: (NOTE) Performed At: [facility] [address] [place], [place] [id number] Rush Farmer MD ZW:[id number]  Source (AFB) FLUID  Final    Comment: SYNOVIAL RIGHT KNEE Performed at [facility],  [place] [address] [place], [place] [address]   Fungus culture, blood     Status: None (Preliminary result)   Collection Time: 07/18/20 12:13 AM   Specimen: BLOOD  Result Value Ref Range Status   Specimen Description BLOOD LEFT ANTECUBITAL  Final   Special Requests   Final    BOTTLES DRAWN AEROBIC AND ANAEROBIC Blood Culture adequate volume   Culture   Final    NO GROWTH 1 DAY Performed at [facility], [place] [address], [place], [place] [address]    Report Status PENDING  Incomplete  Respiratory Panel by RT PCR (Flu A&B, Covid) - Nasopharyngeal Swab     Status: None   Collection Time: 07/18/20 12:24 AM   Specimen: Nasopharyngeal Swab  Result Value Ref Range Status   SARS Coronavirus 2 by RT PCR NEGATIVE NEGATIVE Final    Comment: (NOTE) SARS-CoV-2 target nucleic acids are NOT DETECTED.  The SARS-CoV-2 RNA is generally detectable in upper respiratoy specimens during the acute phase of infection. The lowest concentration of SARS-CoV-2 viral copies this assay can detect is 131 copies/mL. A negative result does not preclude SARS-Cov-2 infection and should not be used as the sole basis for treatment or other patient management decisions. A negative result may occur with  improper specimen collection/handling, submission of specimen other than nasopharyngeal swab, presence of viral mutation(s) within the areas targeted by this assay, and inadequate number of viral copies (<131 copies/mL). A negative result must be combined with clinical observations, patient history, and epidemiological information. The expected result is Negative.  Fact Sheet for Patients:  PinkCheek.be  Fact Sheet for Healthcare Providers:  GravelBags.it  This test is no t yet approved or cleared by the [place] FDA and  has been authorized for detection and/or diagnosis of SARS-CoV-2 by FDA under an Emergency Use Authorization (EUA). This EUA will  remain  in effect (meaning this test can be used) for the duration of the COVID-19 declaration under Section 564(b)(1) of the Act, 21 U.S.C. section 360bbb-3(b)(1), unless the authorization is terminated or revoked sooner.     Influenza A by PCR NEGATIVE NEGATIVE Final   Influenza B by PCR NEGATIVE NEGATIVE Final    Comment: (NOTE) The Xpert Xpress SARS-CoV-2/FLU/RSV assay is intended as an aid in  the diagnosis of influenza from Nasopharyngeal swab specimens and  should not be used as a sole basis for treatment. Nasal washings and  aspirates are unacceptable for Xpert Xpress SARS-CoV-2/FLU/RSV  testing.  Fact Sheet for Patients: PinkCheek.be  Fact Sheet for Healthcare Providers: GravelBags.it  This test is not yet approved or cleared by the [place] FDA and  has been authorized for detection and/or diagnosis of SARS-CoV-2 by  FDA under an Emergency Use Authorization (EUA). This EUA will remain  in effect (meaning this test can be used) for the duration of the  Covid-19 declaration under Section 564(b)(1) of the Act, 21  U.S.C. section 360bbb-3(b)(1), unless the authorization is  terminated or revoked. Performed at [facility], [place] [address], [place], [place] [address]   Aerobic/Anaerobic Culture (surgical/deep wound)     Status: None (Preliminary result)   Collection Time: 07/18/20 12:02 PM   Specimen: Synovium  Result Value Ref Range Status   Specimen Description SYNOVIAL FLUID  Final   Special Requests RIGHT KNEE  Final   Gram Stain   Final  ABUNDANT WBC PRESENT,BOTH PMN AND MONONUCLEAR NO ORGANISMS SEEN    Culture   Final    CULTURE REINCUBATED FOR BETTER GROWTH Performed at [facility], [place] [address] [place], [place] [address]    Report Status PENDING  Incomplete    Janene Madeira, MSN, NP-C [facility] for Infectious [place]:  [phone] Pager: [phone]  07/19/2020 3:38 PM

## 2020-07-19 NOTE — Plan of Care (Signed)

## 2020-07-20 ENCOUNTER — Inpatient Hospital Stay: Payer: Self-pay

## 2020-07-20 DIAGNOSIS — M00161 Pneumococcal arthritis, right knee: Secondary | ICD-10-CM

## 2020-07-20 LAB — BODY FLUID CULTURE

## 2020-07-20 LAB — CBC WITH DIFFERENTIAL/PLATELET
Abs Immature Granulocytes: 0.04 10*3/uL (ref 0.00–0.07)
Basophils Absolute: 0 10*3/uL (ref 0.0–0.1)
Basophils Relative: 0 %
Eosinophils Absolute: 0.1 10*3/uL (ref 0.0–0.5)
Eosinophils Relative: 2 %
HCT: 34 % — ABNORMAL LOW (ref 39.0–52.0)
Hemoglobin: 11 g/dL — ABNORMAL LOW (ref 13.0–17.0)
Immature Granulocytes: 1 %
Lymphocytes Relative: 23 %
Lymphs Abs: 1.5 10*3/uL (ref 0.7–4.0)
MCH: 28.1 pg (ref 26.0–34.0)
MCHC: 32.4 g/dL (ref 30.0–36.0)
MCV: 86.7 fL (ref 80.0–100.0)
Monocytes Absolute: 1.2 10*3/uL — ABNORMAL HIGH (ref 0.1–1.0)
Monocytes Relative: 17 %
Neutro Abs: 3.9 10*3/uL (ref 1.7–7.7)
Neutrophils Relative %: 57 %
Platelets: 259 10*3/uL (ref 150–400)
RBC: 3.92 MIL/uL — ABNORMAL LOW (ref 4.22–5.81)
RDW: 11.9 % (ref 11.5–15.5)
WBC: 6.8 10*3/uL (ref 4.0–10.5)
nRBC: 0 % (ref 0.0–0.2)

## 2020-07-20 LAB — COMPREHENSIVE METABOLIC PANEL
ALT: 18 U/L (ref 0–44)
AST: 18 U/L (ref 15–41)
Albumin: 2.2 g/dL — ABNORMAL LOW (ref 3.5–5.0)
Alkaline Phosphatase: 106 U/L (ref 38–126)
Anion gap: 12 (ref 5–15)
BUN: 11 mg/dL (ref 8–23)
CO2: 28 mmol/L (ref 22–32)
Calcium: 8.8 mg/dL — ABNORMAL LOW (ref 8.9–10.3)
Chloride: 100 mmol/L (ref 98–111)
Creatinine, Ser: 1.05 mg/dL (ref 0.61–1.24)
GFR, Estimated: >60 mL/min (ref >60)
Glucose, Bld: 169 mg/dL — ABNORMAL HIGH (ref 70–99)
Potassium: 4.3 mmol/L (ref 3.5–5.1)
Sodium: 140 mmol/L (ref 135–145)
Total Bilirubin: 1 mg/dL (ref 0.3–1.2)
Total Protein: 6.4 g/dL — ABNORMAL LOW (ref 6.5–8.1)

## 2020-07-20 LAB — CYCLIC CITRUL PEPTIDE ANTIBODY, IGG/IGA: CCP Antibodies IgG/IgA: 6 units (ref 0–19)

## 2020-07-20 LAB — VITAMIN D 25 HYDROXY (VIT D DEFICIENCY, FRACTURES): Vit D, 25-Hydroxy: 11.3 ng/mL — ABNORMAL LOW (ref 30–100)

## 2020-07-20 LAB — B. BURGDORFI ANTIBODIES: B burgdorferi Ab IgG+IgM: <0.91 {ISR} (ref 0.00–0.90)

## 2020-07-20 LAB — GLUCOSE, CAPILLARY
Glucose-Capillary: 133 mg/dL — ABNORMAL HIGH (ref 70–99)
Glucose-Capillary: 159 mg/dL — ABNORMAL HIGH (ref 70–99)

## 2020-07-20 LAB — PREALBUMIN: Prealbumin: 5.8 mg/dL — ABNORMAL LOW (ref 18–38)

## 2020-07-20 LAB — CYTOLOGY - NON PAP

## 2020-07-20 MED ORDER — SODIUM CHLORIDE 0.9 % IV SOLN
2.0000 g | Freq: Three times a day (TID) | INTRAVENOUS | Status: DC
  Administered 2020-07-21 (×2): 2 g via INTRAVENOUS
  Filled 2020-07-20 (×2): qty 2

## 2020-07-20 NOTE — Progress Notes (Addendum)
   [facility] Infectious Diseases Follow Up Note  Patient Identification: Patient Name: Stanley Chaney MRN: [id number] [place] Date: 07/17/2020  2:29 PM Age: 59 y.o.Today's Date: 07/20/2020   Reason for Visit: Follow up On Rt knee septic arthritis   Principal Problem:   Septic arthritis of knee, right ([place]) Active Problems:   Diabetes mellitus ([place])   Arthritis of right knee   Antibiotics:  Pip/tazo Day 4  Assessment Rt knee septic arthitis/Osteomyelitis in the setting of remote h/o Rt knee fracture and bone infection. RT knee synovial fluid cx in day 5 is Pseudomonas aeruginosa, Cipro S. QTc 422 Diabetes Mellitus  PAD  Recommendations  Can de-escalate pip/tazo to cefepime while inpatient  Will plan to do a short course of IV abx for 2-3 weeks followed by PO ( likely Ciprofloxacin). QTc 422 Monitor CBC and CMP while on IV abx PICC line. Blood cx negative in 3 days  ESR and CRP weekly  Follow up with [facility] 11:30 at 11:15 with myself   Diagnosis: osteomyelitis   Culture Result: Pseudomonas aeruginosa   No Known Allergies  OPAT Orders Discharge antibiotics to be given via PICC line Discharge antibiotics: IV cefepime  Per pharmacy protocol   Duration: 3 weeks  End Date: 11/30  [facility] Per Protocol:  Home health RN for IV administration and teaching; PICC line care and labs.    Labs weekly while on IV antibiotics: X__ CBC with differential __ BMP X__ CMP X__ CRP X__ ESR __ Vancomycin trough __ CK  __ Please pull PIC at completion of IV antibiotics X__ Please leave PIC in place until doctor has seen patient or been notified  Fax weekly labs to [phone]  Clinic Follow Up Appt: 11./30  Dr Alphonsa Gin  $Re'@11'tfn$ :15 am   Rest of the management as per the primary team. Thank you for the consult. Please page with pertinent questions or concerns.  Rosiland Oz, MD Infectious [place] [facility] for  Infectious Diseases   To contact the attending provider between 8A-5P or the covering provider during after hours 5P-8A, please log into the web site [email or web address] and access using universal [place] password for that web site. If you do not have the password, please call the hospital operator.

## 2020-07-20 NOTE — Progress Notes (Signed)
Patient is for PICC placement and the writer went to talked to the pt informing about the PICC but he speaks little english. Patient speaks [place]. Tried to use the interpreter machine but not working and patient  said it is late night already and said it is  better to do it in the morning. RN notified.

## 2020-07-20 NOTE — Progress Notes (Addendum)
PHARMACY CONSULT NOTE FOR:  OUTPATIENT  PARENTERAL ANTIBIOTIC THERAPY (OPAT)  Indication: Osteomyelitis  Regimen: Cefepime 2g IV q8h End date: 08/29/2020  IV antibiotic discharge orders are pended. To discharging provider:  please sign these orders via discharge navigator,  Select New Orders & click on the button choice - Manage This Unsigned Work.     Thank you for allowing pharmacy to be a part of this patient's care.  Henri Medal 07/20/2020, 3:05 PM   Addendum - received further information from Dr. [place] - cefepime to continue through 08/08/2020.  Orders modified.  Heide Guile, PharmD, BCPS-AQ ID Clinical Pharmacist Phone [phone]

## 2020-07-20 NOTE — Progress Notes (Signed)
OT Cancellation Note  Patient Details Name: Stanley Chaney MRN: [id number] DOB: [birth year]   Cancelled Treatment:    Reason Eval/Treat Not Completed: Patient declined, no reason specified. On entry, pt eating lunch in bed propped up on elbow. Offered to assist pt to EOB or in chair to eat but pt reports comfortable how he is positioned. Pt politely requesting to eat lunch and complete therapy after. Will follow-up  Layla Maw 07/20/2020, 1:30 PM

## 2020-07-20 NOTE — TOC Initial Note (Addendum)
Transition of Care [facility]) - Initial/Assessment Note    Patient Details  Name: Stanley Chaney MRN: [id number] Date of Birth: [birth year]  [facility] of [facility]) CM/SW Contact:    Curlene Labrum, RN Phone Number: 07/20/2020, 12:41 PM  Clinical Narrative:                 Case management met with the patient at the bedside regarding transitions of care needs.  The patient speaks [place] and very little [place] understanding, but son is fluent in [place] language.  I called the patient's son, Stanley Chaney, on the phone to discuss patient's probably need for IV antibiotics for the next 6 weeks through PICC line pending wound aspirate cultures.  The patient lives currently at the [facility] on [address] in [place], [place] near the son's home since he does not have to navigate steps at his home for now.  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  The patient's son is willing to assist the patient with IV antibiotics as needed for treatment.  I discussed that [place] [facility]) will be reaching out to the patient's son closer to discharge to discuss teaching and cost to the patient.  The Iv infusion company handling the patient's case will be [employer] for coordination of IV antibiotics and [employer] for IV RN. The patient will need confirmed IV dosage prior to discharge and PICC line placed as well.  The patient's son would be willing to transport the patient to outpatient PT/OT as needed for therapy.  The patient currently has a RW at home but will need a 3:1 provided from [address] [employer] supply closet prior to discharge.  I'm setting up with patient with [facility] since the son is not pleased with medical services provided at the [facility].  Depending on the cost of the patient's discharge medications outside of  IV antibiotics, it may be necessary to call the patient's medications into the [place] the patient for assistance with medications. Patient does not have current active insurance until January 2022.  Will continue to follow the patient for discharge needs.  07/20/2020 1522 - PICC line order is placed by MD.  Plans to discharge patient tomorrow once home health set up and teaching is confirmed with [facility].  I called and spoke with Tim, CM at [facility] and left a message to prepare the agency to look for OPAT orders to be signed and PICC line to be placed.      Expected Discharge Plan: [place] Barriers to Discharge: Inadequate or no insurance   Patient Goals and CMS Choice Patient states their goals for this hospitalization and ongoing recovery are:: Patient plans to discharge home to a [facility] close to his son's home. CMS Medicare.gov Compare Post Acute Care list provided to:: Patient Choice offered to / list presented to : Patient, Adult Children (son, Stanley Chaney speaks fluent [place])  Expected Discharge Plan and Services Expected Discharge Plan: [place]   Discharge Planning Services: CM Consult Post Acute Care Choice: Durable Medical Equipment, Home Health Living arrangements for the past 2 months: Hotel/Motel (Patient stays in [facility] on [address], [place])                 DME Arranged:  3-N-1 (Will need 3:1 delivered to room prior to discharge) DME Agency: [employer] Date DME Agency Contacted: 07/20/20 Time DME Agency Contacted: 1208 Representative spoke with at DME Agency: 3:1 to be provided from [place]: RN [place]: [employer] Date [place]: 07/20/20 Time [place]: 1209 Representative spoke with at [place]: Stanton Kidney, [facility] with [place]  Prior Living Arrangements/Services Living arrangements for the past 2 months: Hotel/Motel (Patient stays in  [facility] on [address], [place]) Lives with:: Self, Adult Children Patient language and need for interpreter reviewed:: Yes Do you feel safe going back to the place where you live?: Yes      Need for Family Participation in Patient Care: Yes (Comment) Care giver support system in place?: Yes (comment) Current home services: DME (has rolling walker at home) Criminal Activity/Legal Involvement Pertinent to Current Situation/Hospitalization: No - Comment as needed  Activities of Daily Living Home Assistive Devices/Equipment: Walker (specify type) ADL Screening (condition at time of admission) Patient's cognitive ability adequate to safely complete daily activities?: Yes Is the patient deaf or have difficulty hearing?: No Does the patient have difficulty seeing, even when wearing glasses/contacts?: No Does the patient have difficulty concentrating, remembering, or making decisions?: No Patient able to express need for assistance with ADLs?: No Does the patient have difficulty dressing or bathing?: No Independently performs ADLs?: Yes (appropriate for developmental age) Does the patient have difficulty walking or climbing stairs?: Yes Weakness of Legs: Right Weakness of Arms/Hands: None  Permission Sought/Granted Permission sought to share information with : Case Manager Permission granted to share information with : Yes, Verbal Permission Granted     Permission granted to share info w AGENCY: [place] granted to share info w Relationship: Stanley Chaney, son - speaks fluent English - [phone]     Emotional Assessment Appearance:: Appears stated age Attitude/Demeanor/Rapport: Engaged Affect (typically observed): Accepting Orientation: : Oriented to Self, Oriented to Place, Oriented to  Time, Oriented to Situation Alcohol / Substance Use: Not Applicable Psych Involvement: No (comment)  Admission diagnosis:  Knee pain [M25.569] Septic  arthritis of knee, right ([place]) [M00.9] Patient Active Problem List   Diagnosis Date Noted  . Arthritis of right knee 07/19/2020  . Septic arthritis of knee, right ([place]) 07/18/2020  . PAD (peripheral artery disease) ([place]) 07/20/2018  . Malnutrition of moderate degree 06/11/2018  . Cellulitis of left foot 06/10/2018  . Diabetes mellitus ([place]) 06/10/2018  . Hyperglycemia 06/10/2018  . Cellulitis 06/10/2018   PCP:  Wendie Agreste, MD Pharmacy:   [facility], [place] [address] Phone: [phone] Fax: [phone]     Social Determinants of Health (SDOH) Interventions    Readmission Risk Interventions Readmission Risk Prevention Plan 07/20/2020  Post Dischage Appt Complete  Medication Screening Complete  Transportation Screening Complete  Some recent data might be hidden

## 2020-07-20 NOTE — Progress Notes (Signed)
Orthopaedic Trauma Service Progress Note  Patient ID: Stanley Chaney MRN: [id number] DOB/AGE: [birth year] 59 y.o.  Subjective:  No acute complaints  Appreciate ID input   Elevated RF noted    ROS As above   Objective:   VITALS:   Vitals:   07/19/20 0300 07/19/20 1935 07/20/20 0322 07/20/20 0837  BP: (!) 168/68 (!) 151/57 (!) 146/60 (!) 158/57  Pulse: 73 72 (!) 57 68  Resp: 18 17  17   Temp: 98 F (36.7 C) 99.4 F (37.4 C) 98.4 F (36.9 C) 97.8 F (36.6 C)  TempSrc: Oral Oral Oral Oral  SpO2: 100% 96% 98% 99%  Weight:      Height:        Estimated body mass index is 20.09 kg/m as calculated from the following:   Height as of this encounter: 5\' 10"  (1.778 m).   Weight as of this encounter: 63.5 kg.   Intake/Output      11/10 0701 - 11/11 0700 11/11 0701 - 11/12 0700   P.O. 720 240   I.V. (mL/kg) 784.1 (12.3)    IV Piggyback 82.3    Total Intake(mL/kg) 1586.4 (25) 240 (3.8)   Urine (mL/kg/hr) 950 (0.6) 200 (0.4)   Blood     Total Output 950 200   Net +636.4 +40          LABS  Results for orders placed or performed during the hospital encounter of 07/17/20 (from the past 24 hour(s))  Glucose, capillary     Status: None   Collection Time: 07/19/20  4:18 PM  Result Value Ref Range   Glucose-Capillary 82 70 - 99 mg/dL  Glucose, capillary     Status: Abnormal   Collection Time: 07/19/20  7:37 PM  Result Value Ref Range   Glucose-Capillary 156 (H) 70 - 99 mg/dL  CBC with Differential/Platelet     Status: Abnormal   Collection Time: 07/20/20  1:55 AM  Result Value Ref Range   WBC 6.8 4.0 - 10.5 K/uL   RBC 3.92 (L) 4.22 - 5.81 MIL/uL   Hemoglobin 11.0 (L) 13.0 - 17.0 g/dL   HCT 34.0 (L) 39 - 52 %   MCV 86.7 80.0 - 100.0 fL   MCH 28.1 26.0 - 34.0 pg   MCHC 32.4 30.0 - 36.0 g/dL   RDW 11.9 11.5 - 15.5 %   Platelets 259 150 - 400 K/uL   nRBC 0.0 0.0 - 0.2 %   Neutrophils  Relative % 57 %   Neutro Abs 3.9 1.7 - 7.7 K/uL   Lymphocytes Relative 23 %   Lymphs Abs 1.5 0.7 - 4.0 K/uL   Monocytes Relative 17 %   Monocytes Absolute 1.2 (H) 0.1 - 1.0 K/uL   Eosinophils Relative 2 %   Eosinophils Absolute 0.1 0.0 - 0.5 K/uL   Basophils Relative 0 %   Basophils Absolute 0.0 0.0 - 0.1 K/uL   Immature Granulocytes 1 %   Abs Immature Granulocytes 0.04 0.00 - 0.07 K/uL  Comprehensive metabolic panel     Status: Abnormal   Collection Time: 07/20/20  1:55 AM  Result Value Ref Range   Sodium 140 135 - 145 mmol/L   Potassium 4.3 3.5 - 5.1 mmol/L   Chloride 100 98 - 111 mmol/L   CO2 28 22 - 32 mmol/L  Glucose, Bld 169 (H) 70 - 99 mg/dL   BUN 11 8 - 23 mg/dL   Creatinine, Ser 1.05 0.61 - 1.24 mg/dL   Calcium 8.8 (L) 8.9 - 10.3 mg/dL   Total Protein 6.4 (L) 6.5 - 8.1 g/dL   Albumin 2.2 (L) 3.5 - 5.0 g/dL   AST 18 15 - 41 U/L   ALT 18 0 - 44 U/L   Alkaline Phosphatase 106 38 - 126 U/L   Total Bilirubin 1.0 0.3 - 1.2 mg/dL   GFR, Estimated >60 >60 mL/min   Anion gap 12 5 - 15  Prealbumin     Status: Abnormal   Collection Time: 07/20/20  1:55 AM  Result Value Ref Range   Prealbumin 5.8 (L) 18 - 38 mg/dL  VITAMIN D 25 Hydroxy (Vit-D Deficiency, Fractures)     Status: Abnormal   Collection Time: 07/20/20  1:55 AM  Result Value Ref Range   Vit D, 25-Hydroxy 11.30 (L) 30 - 100 ng/mL  Glucose, capillary     Status: Abnormal   Collection Time: 07/20/20  8:32 AM  Result Value Ref Range   Glucose-Capillary 133 (H) 70 - 99 mg/dL     PHYSICAL EXAM:   Gen: awake, resting comfortably in bed, NAD. Moderate sarcopenia  Lungs: unlabored  Cardiac: RRR Abd: + BS, NTND Ext:       Right lower extremity              Dressing and compressive wraps c/d/i   Dressings removed   All incisions look great    No drainage, no erythema    Decreased effusion              Ext warm              No swelling distally              Motor and sensory functions intact             Ext  cool but + DP pulse   Assessment/Plan: 2 Days Post-Op   Principal Problem:   Septic arthritis of knee, right (HCC) Active Problems:   Diabetes mellitus ([place])   Arthritis of right knee   Anti-infectives (From admission, onward)   Start     Dose/Rate Route Frequency Ordered Stop   07/21/20 0600  ceFEPIme (MAXIPIME) 2 g in sodium chloride 0.9 % 100 mL IVPB        2 g 200 mL/hr over 30 Minutes Intravenous Every 8 hours 07/20/20 1420     07/18/20 0015  piperacillin-tazobactam (ZOSYN) IVPB 3.375 g        3.375 g 12.5 mL/hr over 240 Minutes Intravenous Every 8 hours 07/18/20 0009 07/21/20 0418    .  POD/HD#: 2  59 y/o male with 4 week history of R knee pain, loss of knee motion, effusion and inability to bear weight    - chronic R knee arthritis with ? Superimposed septic joint              + pseudomonas   Treat with IV abx x 6 weeks   WBAT R leg  ROM as tolerated R knee    + RF, additional labs pending       - Pain management:             Current regimen effective              - ABL anemia/Hemodynamics  Stable   - Medical issues              DM                         CBGs look good                          SSI   - DVT/PE prophylaxis:             lovenox while inpatient    - ID:             transitioned to cefepime    - Metabolic Bone Disease:             vitamin d deficiency    Supplement     - Activity:             Up with therapies              WBAT R leg    - FEN/GI prophylaxis/Foley/Lines:             Carb mod diet             Continue with IVF    RD consult      - Dispo:             TOC consult   Possible dc tomorrow          Jari Pigg, PA-C [phone] (C) 07/20/2020, 2:26 PM  [facility] [place] [address] [phone] Jenetta [phone] (F)    After 5pm and on the weekends please log on to Amion, go to orthopaedics and the look under the [facility] Call for the  provider(s) on call. You can also call our office at [phone] and then follow the prompts to be connected to the call team.

## 2020-07-20 NOTE — Progress Notes (Signed)
PT Cancellation Note  Patient Details Name: Stanley Chaney MRN: [id number] DOB: 1961/02/13   Cancelled Treatment:    Reason Eval/Treat Not Completed: Patient declined, no reason specified. Pt does not feel that he needs acute PT services. He states that he can get around just fine and had been up today for toileting and showering. Educated on the benefits of PT and reinforced the need to practice steps. Pt continues to decline. Will try back as time allows.    Allena Katz 07/20/2020, 2:03 PM

## 2020-07-21 ENCOUNTER — Other Ambulatory Visit (HOSPITAL_COMMUNITY): Payer: Self-pay | Admitting: Orthopedic Surgery

## 2020-07-21 ENCOUNTER — Encounter (HOSPITAL_COMMUNITY): Payer: Self-pay | Admitting: Orthopedic Surgery

## 2020-07-21 DIAGNOSIS — E559 Vitamin D deficiency, unspecified: Secondary | ICD-10-CM | POA: Diagnosis present

## 2020-07-21 DIAGNOSIS — R768 Other specified abnormal immunological findings in serum: Secondary | ICD-10-CM | POA: Diagnosis present

## 2020-07-21 DIAGNOSIS — M869 Osteomyelitis, unspecified: Secondary | ICD-10-CM | POA: Diagnosis present

## 2020-07-21 DIAGNOSIS — I10 Essential (primary) hypertension: Secondary | ICD-10-CM | POA: Diagnosis present

## 2020-07-21 HISTORY — DX: Other specified abnormal immunological findings in serum: R76.8

## 2020-07-21 HISTORY — DX: Vitamin D deficiency, unspecified: E55.9

## 2020-07-21 LAB — BASIC METABOLIC PANEL
Anion gap: 10 (ref 5–15)
BUN: 12 mg/dL (ref 8–23)
CO2: 30 mmol/L (ref 22–32)
Calcium: 9.1 mg/dL (ref 8.9–10.3)
Chloride: 101 mmol/L (ref 98–111)
Creatinine, Ser: 1.06 mg/dL (ref 0.61–1.24)
GFR, Estimated: >60 mL/min (ref >60)
Glucose, Bld: 164 mg/dL — ABNORMAL HIGH (ref 70–99)
Potassium: 4.6 mmol/L (ref 3.5–5.1)
Sodium: 141 mmol/L (ref 135–145)

## 2020-07-21 LAB — CBC
HCT: 33 % — ABNORMAL LOW (ref 39.0–52.0)
Hemoglobin: 10.7 g/dL — ABNORMAL LOW (ref 13.0–17.0)
MCH: 28.5 pg (ref 26.0–34.0)
MCHC: 32.4 g/dL (ref 30.0–36.0)
MCV: 88 fL (ref 80.0–100.0)
Platelets: 291 10*3/uL (ref 150–400)
RBC: 3.75 MIL/uL — ABNORMAL LOW (ref 4.22–5.81)
RDW: 11.9 % (ref 11.5–15.5)
WBC: 8 10*3/uL (ref 4.0–10.5)
nRBC: 0 % (ref 0.0–0.2)

## 2020-07-21 LAB — GLUCOSE, CAPILLARY
Glucose-Capillary: 131 mg/dL — ABNORMAL HIGH (ref 70–99)
Glucose-Capillary: 129 mg/dL — ABNORMAL HIGH (ref 70–99)
Glucose-Capillary: 135 mg/dL — ABNORMAL HIGH (ref 70–99)

## 2020-07-21 LAB — RPR: RPR Ser Ql: NONREACTIVE

## 2020-07-21 MED ORDER — ACETAMINOPHEN 500 MG PO TABS: 500.0000 mg | ORAL_TABLET | Freq: Three times a day (TID) | ORAL | 0 refills | Status: DC | PRN

## 2020-07-21 MED ORDER — HYDROCODONE-ACETAMINOPHEN 5-325 MG PO TABS
1.0000 | ORAL_TABLET | Freq: Two times a day (BID) | ORAL | 0 refills | Status: DC | PRN
Start: 2020-07-21 — End: 2020-07-21

## 2020-07-21 MED ORDER — LISINOPRIL 5 MG PO TABS: 5.0000 mg | ORAL_TABLET | Freq: Every day | ORAL | 1 refills | Status: DC

## 2020-07-21 MED ORDER — LISINOPRIL 5 MG PO TABS
5.0000 mg | ORAL_TABLET | Freq: Every day | ORAL | Status: DC
  Filled 2020-07-21: qty 1

## 2020-07-21 MED ORDER — SODIUM CHLORIDE 0.9% FLUSH: 10.0000 mL | INTRAVENOUS | Status: DC | PRN

## 2020-07-21 MED ORDER — SODIUM CHLORIDE 0.9% FLUSH: 10.0000 mL | Freq: Two times a day (BID) | INTRAVENOUS | Status: DC

## 2020-07-21 MED ORDER — CEFEPIME IV (FOR PTA / DISCHARGE USE ONLY): 2.0000 g | Freq: Three times a day (TID) | INTRAVENOUS | 0 refills | Status: AC

## 2020-07-21 MED ORDER — VITAMIN D 125 MCG (5000 UT) PO CAPS: 1.0000 | ORAL_CAPSULE | Freq: Every day | ORAL | 3 refills | Status: DC

## 2020-07-21 MED ORDER — CHLORHEXIDINE GLUCONATE CLOTH 2 % EX PADS: 6.0000 | MEDICATED_PAD | Freq: Every day | CUTANEOUS | Status: DC

## 2020-07-21 MED FILL — ACETAMINOPHEN 500MG XT STRE: 500 | 20 days supply | Qty: 60 | Fill #0

## 2020-07-21 MED FILL — LISINOPRIL 5 MG TABS: 5 | 30 days supply | Qty: 30 | Fill #0

## 2020-07-21 MED FILL — VITAMIN D3 5,000 UNIT TAB: 125 MCG | 30 days supply | Qty: 30 | Fill #0

## 2020-07-21 MED FILL — HYDROCODON-APAP 5-325: 5-325 | 5 days supply | Qty: 30 | Fill #0

## 2020-07-21 NOTE — Discharge Instructions (Addendum)
Orthopaedic Trauma Service Discharge Instructions   General Discharge Instructions  Orthopaedic Injuries:  Septic arthritis right knee, osteomyelitis right femur and tibia. Treated with irrigation and debridement  WEIGHT BEARING STATUS: Weightbearing as tolerated right leg. Use crutches or cane for assistance if needed   RANGE OF MOTION/ACTIVITY: unrestricted range of motion of right knee. Activity as tolerated   Bone health: labs show vitamin d deficiency. Take vitamin d supplements that have been prescribed for you. Eat a well balanced diet with adequate protein and to optimize healing  Wound Care: Daily wound care as needed starting now.  Can use 4 x 4 gauze and tape over the knee incision.  Okay to leave incisions open to the air if there is no drainage.  Clean wounds with soap and water only.  Do not apply ointments lotions or solutions such as Betadine, hydrogen peroxide or Neosporin type products  Discharge Wound Care Instructions  Do NOT apply any ointments, solutions or lotions to pin sites or surgical wounds.  These prevent needed drainage and even though solutions like hydrogen peroxide kill bacteria, they also damage cells lining the pin sites that help fight infection.  Applying lotions or ointments can keep the wounds moist and can cause them to breakdown and open up as well. This can increase the risk for infection. When in doubt call the office.  Surgical incisions should be dressed daily.  If any drainage is noted, use 4 x 4 gauze and tape  Okay to use an Ace wrap or compressive garments to help with swelling in the leg as well.  Icing and elevating will also help with swelling control.  Elevate leg above heart is much as possible if swelling persists  Once the incision is completely dry and without drainage, it may be left open to air out.  Showering may begin 36-48 hours later.  Cleaning gently with soap and water.  Diet: as you were eating previously.  Can use over  the counter stool softeners and bowel preparations, such as Miralax, to help with bowel movements.  Narcotics can be constipating.  Be sure to drink plenty of fluids  PAIN MEDICATION USE AND EXPECTATIONS  You have likely been given narcotic medications to help control your pain.  After a traumatic event that results in an fracture (broken bone) with or without surgery, it is ok to use narcotic pain medications to help control one's pain.  We understand that everyone responds to pain differently and each individual patient will be evaluated on a regular basis for the continued need for narcotic medications. Ideally, narcotic medication use should last no more than 6-8 weeks (coinciding with fracture healing).   As a patient it is your responsibility as well to monitor narcotic medication use and report the amount and frequency you use these medications when you come to your office visit.   We would also advise that if you are using narcotic medications, you should take a dose prior to therapy to maximize you participation.  IF YOU ARE ON NARCOTIC MEDICATIONS IT IS NOT PERMISSIBLE TO OPERATE A MOTOR VEHICLE (MOTORCYCLE/CAR/TRUCK/MOPED) OR HEAVY MACHINERY DO NOT MIX NARCOTICS WITH OTHER CNS (CENTRAL NERVOUS SYSTEM) DEPRESSANTS SUCH AS ALCOHOL   STOP SMOKING OR USING NICOTINE PRODUCTS!!!!  As discussed nicotine severely impairs your body's ability to heal surgical and traumatic wounds but also impairs bone healing.  Wounds and bone heal by forming microscopic blood vessels (angiogenesis) and nicotine is a vasoconstrictor (essentially, shrinks blood vessels).  Therefore, if  vasoconstriction occurs to these microscopic blood vessels they essentially disappear and are unable to deliver necessary nutrients to the healing tissue.  This is one modifiable factor that you can do to dramatically increase your chances of healing your injury.    (This means no smoking, no nicotine gum, patches, etc)     ICE AND  ELEVATE INJURED/OPERATIVE EXTREMITY  Using ice and elevating the injured extremity above your heart can help with swelling and pain control.  Icing in a pulsatile fashion, such as 20 minutes on and 20 minutes off, can be followed.    Do not place ice directly on skin. Make sure there is a barrier between to skin and the ice pack.    Using frozen items such as frozen peas works well as the conform nicely to the are that needs to be iced.  USE AN ACE WRAP OR TED HOSE FOR SWELLING CONTROL  In addition to icing and elevation, Ace wraps or TED hose are used to help limit and resolve swelling.  It is recommended to use Ace wraps or TED hose until you are informed to stop.    When using Ace Wraps start the wrapping distally (farthest away from the body) and wrap proximally (closer to the body)   Example: If you had surgery on your leg or thing and you do not have a splint on, start the ace wrap at the toes and work your way up to the thigh        If you had surgery on your upper extremity and do not have a splint on, start the ace wrap at your fingers and work your way up to the upper arm  IF YOU ARE IN A SPLINT OR CAST DO NOT [place]   If your splint gets wet for any reason please contact the office immediately. You may shower in your splint or cast as long as you keep it dry.  This can be done by wrapping in a cast cover or garbage back (or similar)  Do Not stick any thing down your splint or cast such as pencils, money, or hangers to try and scratch yourself with.  If you feel itchy take benadryl as prescribed on the bottle for itching  IF YOU ARE IN A CAM BOOT (BLACK BOOT)  You may remove boot periodically. Perform daily dressing changes as noted below.  Wash the liner of the boot regularly and wear a sock when wearing the boot. It is recommended that you sleep in the boot until told otherwise    Call office for the following:  Temperature greater than 101F  Persistent nausea  and vomiting  Severe uncontrolled pain  Redness, tenderness, or signs of infection (pain, swelling, redness, odor or green/yellow discharge around the site)  Difficulty breathing, headache or visual disturbances  Hives  Persistent dizziness or light-headedness  Extreme fatigue  Any other questions or concerns you may have after discharge  In an emergency, call 911 or go to an Emergency Department at a nearby hospital  HELPFUL INFORMATION  ? If you had a block, it will wear off between 8-24 hrs postop typically.  This is period when your pain may go from nearly zero to the pain you would have had postop without the block.  This is an abrupt transition but nothing dangerous is happening.  You may take an extra dose of narcotic when this happens.  ? You should wean off your narcotic medicines as soon as  you are able.  Most patients will be off or using minimal narcotics before their first postop appointment.   ? We suggest you use the pain medication the first night prior to going to bed, in order to ease any pain when the anesthesia wears off. You should avoid taking pain medications on an empty stomach as it will make you nauseous.  ? Do not drink alcoholic beverages or take illicit drugs when taking pain medications.  ? In most states it is against the law to drive while you are in a splint or sling.  And certainly against the law to drive while taking narcotics.  ? You may return to work/school in the next couple of days when you feel up to it.   ? Pain medication may make you constipated.  Below are a few solutions to try in this order: - Decrease the amount of pain medication if you aren't having pain. - Drink lots of decaffeinated fluids. - Drink prune juice and/or each dried prunes  o If the first 3 don't work start with additional solutions - Take Colace - an over-the-counter stool softener - Take Senokot - an over-the-counter laxative - Take Miralax - a stronger  over-the-counter laxative     CALL THE OFFICE WITH ANY QUESTIONS OR CONCERNS: [phone]   VISIT OUR WEBSITE FOR ADDITIONAL INFORMATION: [email or web address]

## 2020-07-21 NOTE — Progress Notes (Signed)
Patient is alert and oriented. Discharge package printed and instructions given to son and patient. Verbalizes understanding.

## 2020-07-21 NOTE — Progress Notes (Signed)
Peripherally Inserted Central Catheter Placement  The IV Nurse has discussed with the patient and/or persons authorized to consent for the patient, the purpose of this procedure and the potential benefits and risks involved with this procedure.  The benefits include less needle sticks, lab draws from the catheter, and the patient may be discharged home with the catheter. Risks include, but not limited to, infection, bleeding, blood clot (thrombus formation), and puncture of an artery; nerve damage and irregular heartbeat and possibility to perform a PICC exchange if needed/ordered by physician.  Alternatives to this procedure were also discussed.  Bard Power PICC patient education guide, fact sheet on infection prevention and patient information card has been provided to patient /or left at bedside.    PICC Placement Documentation  PICC Single Lumen 07/21/20 PICC Right Brachial 38 cm 1 cm (Active)  Indication for Insertion or Continuance of Line Home intravenous therapies (PICC only) 07/21/20 1406  Exposed Catheter (cm) 1 cm 07/21/20 1406  Site Assessment Clean;Dry;Intact 07/21/20 1406  Line Status Flushed;Saline locked;Blood return noted 07/21/20 1406  Dressing Type Transparent;Securing device 07/21/20 1406  Dressing Status Clean;Dry;Intact 07/21/20 1406  Antimicrobial disc in place? Yes 07/21/20 1406  Safety Lock Not Applicable 76/28/31 5176  Line Care Connections checked and tightened 07/21/20 1406  Dressing Intervention New dressing 07/21/20 1406  Dressing Change Due 07/28/20 07/21/20 1406       Loneta Tamplin Ortiz 07/21/2020, 2:09 PM

## 2020-07-21 NOTE — Discharge Summary (Signed)
Orthopaedic Trauma Service (OTS) Discharge Summary   Patient ID: Stanley Chaney MRN: [id number] DOB/AGE: [birth year] 61 y.o.  Admit date: 07/17/2020 Discharge date: 07/21/2020  Admission Diagnoses: Acute on chronic Right knee pain  Right knee effusion  DM    Discharge Diagnoses:  Principal Problem:   Septic arthritis of knee, right ([place]) Active Problems:   Diabetes mellitus ([place])   Arthritis of right knee   Osteomyelitis (H[facility])   HTN (hypertension)   Rheumatoid factor positive   Vitamin D deficiency   Past Medical History:  Diagnosis Date  . Arthritis of right knee 07/19/2020  . Diabetes mellitus without complication ([place])   . PAD (peripheral artery disease) ([place])   . Rheumatoid factor positive 07/21/2020  . Vitamin D deficiency 07/21/2020     Procedures Performed: 07/18/2020- Dr. Marcelino Scot  Arthroscopic I&D right knee Scopic synovectomy right knee  07/21/2020: PICC line placement  Discharged Condition: good  Hospital Course:   Patient is a 59 year old male admitted on 07/17/2020 with a bizarre presentation for right knee pain.  Please see consult.  Patient with insidious onset of right knee pain and effusion for about a month.  Exacerbated significantly about 3 to 4 days prior to ED presentation.  Given the unusual nature of his presentation a comprehensive work-up was conducted.  Ultimately synovial fluid from his arthrocentesis grew out Pseudomonas.  Patient was taken to the operating room on 07/18/2020 for formal irrigation debridement and synovectomy.  Patient tolerated procedure well.  Transferred back to the orthopedic floor for observation, pain control and therapies.  From this point his hospital stay was really uncomplicated.  Given his unusual presentation we did wait for his cultures to finalize prior to discharge.  Given the atypical presentation as well we did involve the infectious disease service for guidance and assistance in antibiotic selection  and duration of treatment.  Again comprehensive work-up was conducted.  As of this moment he does have a notable positive rheumatoid factor.  Additional rheumatoid labs are pending.  He was noted to be vitamin D deficient and was started on supplementation.  Additional infectious disease labs were negative including HIV, hepatitis.  RPR is pending at this time.  Home health services were arranged.  A PICC line was placed on 07/21/2020.  Patient was transitioned from Zosyn to cefepime by the infectious disease service given the sensitivities.  He will be on IV antibiotics for 2 to 3 weeks and then converted to oral fluoroquinolone given sensitivities as well.  We are being aggressive with his management as he will likely require a total knee replacement in the near future.  Incidentally during his hospitalization patient was noted to be persistently hypertensive.  Given his diabetes we did start him on low-dose lisinopril 5 mg daily.  He will continue on this until follow-up with his PCP for reevaluation as to discontinuing, continuation or escalating dose.  Of note his other home medications that were listed on intake patient noted that he is no longer taking including his diabetes medications.  States that he is controlling his diabetes with diet measures.  His A1c is down to 7.7%, it was 9% about 8 months ago.  Dietetics consults also obtained to optimize nutrition given multiple noted deficiencies as well as pretty moderate sarcopenia  Patient discharged in stable condition on 07/21/2020.  Consults: ID  Significant Diagnostic Studies: labs:   Results for Stanley, Chaney (MRN [id number]) as of 07/21/2020 10:42  Ref. Range 07/18/2020 00:02  Hemoglobin A1C Latest Ref Range:  4.8 - 5.6 % 7.7 (H)  Anti Nuclear Antibody (ANA) Latest Ref Range: Negative  Negative  CCP Antibodies IgG/IgA Latest Ref Range: 0 - 19 units 6  RA Latex Turbid. Latest Ref Range: 0.0 - 13.9 IU/mL 31.5 (H)   Results for  Stanley, Chaney (MRN [id number]) as of 07/21/2020 10:42  Ref. Range 07/17/2020 18:00  Sed Rate Latest Ref Range: 0 - 16 mm/hr 111 (H)   Results for Stanley, Chaney (MRN [id number]) as of 07/21/2020 10:42  Ref. Range 07/17/2020 18:00  CRP Latest Ref Range: <1.0 mg/dL 20.5 (H)   Results for Stanley, Chaney (MRN [id number]) as of 07/21/2020 10:42  Ref. Range 07/20/2020 01:55  Vitamin D, 25-Hydroxy Latest Ref Range: 30 - 100 ng/mL 11.30 (L)   Results for Stanley, Chaney (MRN [id number]) as of 07/21/2020 10:42  Ref. Range 07/20/2020 01:55 07/21/2020 02:43  Sodium Latest Ref Range: 135 - 145 mmol/L 140 141  Potassium Latest Ref Range: 3.5 - 5.1 mmol/L 4.3 4.6  Chloride Latest Ref Range: 98 - 111 mmol/L 100 101  CO2 Latest Ref Range: 22 - 32 mmol/L 28 30  Glucose Latest Ref Range: 70 - 99 mg/dL 169 (H) 164 (H)  BUN Latest Ref Range: 8 - 23 mg/dL 11 12  Creatinine Latest Ref Range: 0.61 - 1.24 mg/dL 1.05 1.06  Calcium Latest Ref Range: 8.9 - 10.3 mg/dL 8.8 (L) 9.1  Anion gap Latest Ref Range: 5 - $R'15  12 10  'kE$ Alkaline Phosphatase Latest Ref Range: 38 - 126 U/L 106   Albumin Latest Ref Range: 3.5 - 5.0 g/dL 2.2 (L)   AST Latest Ref Range: 15 - 41 U/L 18   ALT Latest Ref Range: 0 - 44 U/L 18   Total Protein Latest Ref Range: 6.5 - 8.1 g/dL 6.4 (L)   Total Bilirubin Latest Ref Range: 0.3 - 1.2 mg/dL 1.0   PREALBUMIN Latest Ref Range: 18 - 38 mg/dL 5.8 (L)   GFR, Estimated Latest Ref Range: >60 mL/min >60 >60   Results for Stanley, Chaney (MRN [id number]) as of 07/21/2020 10:42  Ref. Range 07/18/2020 00:24 07/19/2020 02:21  Influenza A By PCR Latest Ref Range: NEGATIVE  NEGATIVE   Influenza B By PCR Latest Ref Range: NEGATIVE  NEGATIVE   SARS Coronavirus 2 by RT PCR Latest Ref Range: NEGATIVE  NEGATIVE   Hep A Ab, IgM Latest Ref Range: NON REACTIVE   NON REACTIVE  Hepatitis B Surface Ag Latest Ref Range: NON REACTIVE   NON REACTIVE  Hep B Core Ab, IgM Latest Ref Range: NON REACTIVE    NON REACTIVE  HCV Ab Latest Ref Range: NON REACTIVE   NON REACTIVE   Results for Stanley, Chaney (MRN [id number]) as of 07/21/2020 10:42  Ref. Range 07/18/2020 00:02  HIV Screen 4th Generation wRfx Latest Ref Range: Non Reactive  Non Reactive   Component 4 d ago  Specimen Description SYNOVIAL FLUID KNEE   Special Requests NONE   Gram Stain FEW WBC PRESENT,BOTH PMN AND MONONUCLEAR  NO ORGANISMS SEEN   Culture RARE PSEUDOMONAS AERUGINOSA  CRITICAL RESULT CALLED TO, READ BACK BY AND VERIFIED WITH: RN J.LION AT 1140 ON 07/18/2020 BY T.SAAD  Performed at [facility], [place] [address], [place], [place] [address]   Report Status 07/20/2020 FINAL   Organism ID, Bacteria PSEUDOMONAS AERUGINOSA   Resulting Agency [facility]  Susceptibility   Pseudomonas aeruginosa    MIC    CEFEPIME 2 SENSITIVE  Sensitive    CEFTAZIDIME 4  SENSITIVE  Sensitive    CIPROFLOXACIN <=0.25 SENS... Sensitive    GENTAMICIN <=1 SENSITIVE  Sensitive    IMIPENEM 2 SENSITIVE  Sensitive    PIP/TAZO 8 SENSITIVE  Sensitive         Susceptibility Comments  Pseudomonas aeruginosa  RARE PSEUDOMONAS AERUGINOSA      Specimen Collected: 07/17/20 19:31 Last Resulted: 07/20/20 08:54      Treatments: IV hydration, antibiotics: Zosyn, converted to cefepime once C&S finalized, analgesia: acetaminophen and Norco, anticoagulation: Lovenox while inpatient, therapies: PT, OT and RN, procedures: PICC line and surgery: As above  Discharge Exam:        Orthopaedic Trauma Service Progress Note   Patient ID: Stanley Chaney MRN: [id number] DOB/AGE: [birth year] 59 y.o.   Subjective:   Doing well No issues Ready to go home    PICC to be placed today      ROS As above   Objective:    VITALS:         Vitals:    07/20/20 1521 07/20/20 1917 07/21/20 0340 07/21/20 0845  BP: (!) 151/67 (!) 169/77 (!) 152/66 (!) 178/61  Pulse: 61 71 64 (!) 51  Resp: $Remo'16 17 17 17  'YUtBB$ Temp: 98.6 F (37 C) 98.4 F (36.9 C) 99.1 F  (37.3 C) 98.3 F (36.8 C)  TempSrc: Oral Oral Oral Oral  SpO2: 97% 97% 97% 99%  Weight:          Height:              Estimated body mass index is 20.09 kg/m as calculated from the following:   Height as of this encounter: $RemoveBeforeD'5\' 10"'XUBcvEIFIDDotG$  (1.778 m).   Weight as of this encounter: 63.5 kg.     Intake/Output      11/11 0701 - 11/12 0700 11/12 0701 - 11/13 0700   P.O. 720 300   I.V. (mL/kg) 197.7 (3.1)    IV Piggyback 217.7    Total Intake(mL/kg) 1135.4 (17.9) 300 (4.7)   Urine (mL/kg/hr) 1250 (0.8)    Total Output 1250    Net -114.6 +300           LABS   Lab Results Last 24 Hours       Results for orders placed or performed during the hospital encounter of 07/17/20 (from the past 24 hour(s))  Glucose, capillary     Status: Abnormal    Collection Time: 07/20/20  3:55 PM  Result Value Ref Range    Glucose-Capillary 159 (H) 70 - 99 mg/dL  Basic metabolic panel     Status: Abnormal    Collection Time: 07/21/20  2:43 AM  Result Value Ref Range    Sodium 141 135 - 145 mmol/L    Potassium 4.6 3.5 - 5.1 mmol/L    Chloride 101 98 - 111 mmol/L    CO2 30 22 - 32 mmol/L    Glucose, Bld 164 (H) 70 - 99 mg/dL    BUN 12 8 - 23 mg/dL    Creatinine, Ser 1.06 0.61 - 1.24 mg/dL    Calcium 9.1 8.9 - 10.3 mg/dL    GFR, Estimated >60 >60 mL/min    Anion gap 10 5 - 15  CBC     Status: Abnormal    Collection Time: 07/21/20  2:43 AM  Result Value Ref Range    WBC 8.0 4.0 - 10.5 K/uL    RBC 3.75 (L) 4.22 - 5.81 MIL/uL    Hemoglobin 10.7 (L) 13.0 - 17.0 g/dL  HCT 33.0 (L) 39 - 52 %    MCV 88.0 80.0 - 100.0 fL    MCH 28.5 26.0 - 34.0 pg    MCHC 32.4 30.0 - 36.0 g/dL    RDW 11.9 11.5 - 15.5 %    Platelets 291 150 - 400 K/uL    nRBC 0.0 0.0 - 0.2 %  Glucose, capillary     Status: Abnormal    Collection Time: 07/21/20  6:52 AM  Result Value Ref Range    Glucose-Capillary 131 (H) 70 - 99 mg/dL          PHYSICAL EXAM:      Gen: awake, resting comfortably in bed, NAD. Moderate  sarcopenia  Lungs: unlabored  Cardiac: RRR Abd: + BS, NTND Ext:       Right lower extremity              Dressings R knee are stable              Ext warm              No swelling distally              Motor and sensory functions intact             Ext cool but + DP pulse              Improved ankle and knee ROM    Assessment/Plan: 3 Days Post-Op    Principal Problem:   Septic arthritis of knee, right ([facility]) Active Problems:   Diabetes mellitus ([place])   Arthritis of right knee   Osteomyelitis ([facility])   HTN (hypertension)   Rheumatoid factor positive   Vitamin D deficiency                Anti-infectives (From admission, onward)      Start     Dose/Rate Route Frequency Ordered Stop    07/21/20 0600   ceFEPIme (MAXIPIME) 2 g in sodium chloride 0.9 % 100 mL IVPB        2 g 200 mL/hr over 30 Minutes Intravenous Every 8 hours 07/20/20 1420      07/18/20 0015   piperacillin-tazobactam (ZOSYN) IVPB 3.375 g        3.375 g 12.5 mL/hr over 240 Minutes Intravenous Every 8 hours 07/18/20 0009 07/21/20 0418       .   POD/HD#: 60   59 y/o male with 4 week history of R knee pain, loss of knee motion, effusion and inability to bear weight    - chronic R knee arthritis with ? Superimposed septic joint              + pseudomonas              Treat with IV abx x 2-3 weeks the PO FQ as it is sensitive per ID recs                WBAT R leg             ROM as tolerated R knee                + RF, additional labs pending                   - Pain management:             Current regimen effective             Pt primarily  using tylenol over last 36 hours               - ABL anemia/Hemodynamics             Stable   - Medical issues              DM                         CBGs look good                          SSI               HTN                         No reported history but he has been persistently hypertensive                         Given history of DM will start on low  dose ACEi                         Follow up with PCP in 2-3 weeks    - DVT/PE prophylaxis:             lovenox while inpatient              No pharmacologics at dc    - ID:             transitioned to cefepime               Duration per ID              Follow up with ID on 11/30               Awaiting PICC    - Metabolic Bone Disease:             vitamin d deficiency                          Supplement                - Activity:             Up with therapies              WBAT R leg    - FEN/GI prophylaxis/Foley/Lines:             Carb mod diet             Continue with IVF                RD consult appreciated      - Dispo:             TOC to arrange [place] needs             Dc home today              Follow up with ortho in 2-3 weeks, call for appointment              Follow up with ID on 11/30             Follow up with PCP in 2-3 weeks, call for appointment      Disposition: Discharge disposition: 01-Home or Self Care  Discharge Instructions    [facility] pharmacist to adjust dose for Vancomycin, Aminoglycosides and other anti-infective therapies as requested by physician.   Complete by: As directed    [facility] to provide Cath Flo 2mg    Complete by: As directed    Administer for PICC line occlusion and as ordered by physician for other access device issues.   Ambulatory referral to Physical Therapy   Complete by: As directed    Anaphylaxis Kit: Provided to treat any anaphylactic reaction to the medication being provided to the patient if First Dose or when requested by physician   Complete by: As directed    Epinephrine 1mg /ml vial / amp: Administer 0.3mg  (0.54ml) subcutaneously once for moderate to severe anaphylaxis, nurse to call physician and pharmacy when reaction occurs and call 911 if needed for immediate care   Diphenhydramine 50mg /ml IV vial: Administer 25-50mg  IV/IM PRN for first dose reaction, rash, itching, mild  reaction, nurse to call physician and pharmacy when reaction occurs   Sodium Chloride 0.9% NS 553ml IV: Administer if needed for hypovolemic blood pressure drop or as ordered by physician after call to physician with anaphylactic reaction   Call MD / Call 911   Complete by: As directed    If you experience chest pain or shortness of breath, CALL 911 and be transported to the hospital emergency room.  If you develope a fever above 101 F, pus (white drainage) or increased drainage or redness at the wound, or calf pain, call your surgeon's office.   Change dressing on IV access line weekly and PRN   Complete by: As directed    Constipation Prevention   Complete by: As directed    Drink plenty of fluids.  Prune juice may be helpful.  You may use a stool softener, such as Colace (over the counter) 100 mg twice a day.  Use MiraLax (over the counter) for constipation as needed.   Diet Carb Modified   Complete by: As directed    Discharge instructions   Complete by: As directed    Orthopaedic Trauma Service Discharge Instructions   General Discharge Instructions  Orthopaedic Injuries:  Septic arthritis right knee, osteomyelitis right femur and tibia. Treated with irrigation and debridement  WEIGHT BEARING STATUS: Weightbearing as tolerated right leg. Use crutches or cane for assistance if needed   RANGE OF MOTION/ACTIVITY: unrestricted range of motion of right knee. Activity as tolerated   Bone health: labs show vitamin d deficiency. Take vitamin d supplements that have been prescribed for you. Eat a well balanced diet with adequate protein and to optimize healing  Wound Care: Daily wound care as needed starting now.  Can use 4 x 4 gauze and tape over the knee incision.  Okay to leave incisions open to the air if there is no drainage.  Clean wounds with soap and water only.  Do not apply ointments lotions or solutions such as Betadine, hydrogen peroxide or Neosporin type products  Discharge  Wound Care Instructions  Do NOT apply any ointments, solutions or lotions to pin sites or surgical wounds.  These prevent needed drainage and even though solutions like hydrogen peroxide kill bacteria, they also damage cells lining the pin sites that help fight infection.  Applying lotions or ointments can keep the wounds moist and can cause them to breakdown and open up as well. This can increase the risk for infection. When in doubt call the office.  Surgical incisions should be dressed daily.  If  any drainage is noted, use 4 x 4 gauze and tape  Okay to use an Ace wrap or compressive garments to help with swelling in the leg as well.  Icing and elevating will also help with swelling control.  Elevate leg above heart is much as possible if swelling persists  Once the incision is completely dry and without drainage, it may be left open to air out.  Showering may begin 36-48 hours later.  Cleaning gently with soap and water.  Diet: as you were eating previously.  Can use over the counter stool softeners and bowel preparations, such as Miralax, to help with bowel movements.  Narcotics can be constipating.  Be sure to drink plenty of fluids  PAIN MEDICATION USE AND EXPECTATIONS  You have likely been given narcotic medications to help control your pain.  After a traumatic event that results in an fracture (broken bone) with or without surgery, it is ok to use narcotic pain medications to help control one's pain.  We understand that everyone responds to pain differently and each individual patient will be evaluated on a regular basis for the continued need for narcotic medications. Ideally, narcotic medication use should last no more than 6-8 weeks (coinciding with fracture healing).   As a patient it is your responsibility as well to monitor narcotic medication use and report the amount and frequency you use these medications when you come to your office visit.   We would also advise that if you are  using narcotic medications, you should take a dose prior to therapy to maximize you participation.  IF YOU ARE ON NARCOTIC MEDICATIONS IT IS NOT PERMISSIBLE TO OPERATE A MOTOR VEHICLE (MOTORCYCLE/CAR/TRUCK/MOPED) OR HEAVY MACHINERY DO NOT MIX NARCOTICS WITH OTHER CNS (CENTRAL NERVOUS SYSTEM) DEPRESSANTS SUCH AS ALCOHOL   STOP SMOKING OR USING NICOTINE PRODUCTS!!!!  As discussed nicotine severely impairs your body's ability to heal surgical and traumatic wounds but also impairs bone healing.  Wounds and bone heal by forming microscopic blood vessels (angiogenesis) and nicotine is a vasoconstrictor (essentially, shrinks blood vessels).  Therefore, if vasoconstriction occurs to these microscopic blood vessels they essentially disappear and are unable to deliver necessary nutrients to the healing tissue.  This is one modifiable factor that you can do to dramatically increase your chances of healing your injury.    (This means no smoking, no nicotine gum, patches, etc)     ICE AND ELEVATE INJURED/OPERATIVE EXTREMITY  Using ice and elevating the injured extremity above your heart can help with swelling and pain control.  Icing in a pulsatile fashion, such as 20 minutes on and 20 minutes off, can be followed.    Do not place ice directly on skin. Make sure there is a barrier between to skin and the ice pack.    Using frozen items such as frozen peas works well as the conform nicely to the are that needs to be iced.  USE AN ACE WRAP OR TED HOSE FOR SWELLING CONTROL  In addition to icing and elevation, Ace wraps or TED hose are used to help limit and resolve swelling.  It is recommended to use Ace wraps or TED hose until you are informed to stop.    When using Ace Wraps start the wrapping distally (farthest away from the body) and wrap proximally (closer to the body)   Example: If you had surgery on your leg or thing and you do not have a splint on, start the ace wrap at the toes and  work your way up to  the thigh        If you had surgery on your upper extremity and do not have a splint on, start the ace wrap at your fingers and work your way up to the upper arm  IF YOU ARE IN A SPLINT OR CAST DO NOT REMOVE IT FOR ANY REASON   If your splint gets wet for any reason please contact the office immediately. You may shower in your splint or cast as long as you keep it dry.  This can be done by wrapping in a cast cover or garbage back (or similar)  Do Not stick any thing down your splint or cast such as pencils, money, or hangers to try and scratch yourself with.  If you feel itchy take benadryl as prescribed on the bottle for itching  IF YOU ARE IN A CAM BOOT (BLACK BOOT)  You may remove boot periodically. Perform daily dressing changes as noted below.  Wash the liner of the boot regularly and wear a sock when wearing the boot. It is recommended that you sleep in the boot until told otherwise    Call office for the following: Temperature greater than 101F Persistent nausea and vomiting Severe uncontrolled pain Redness, tenderness, or signs of infection (pain, swelling, redness, odor or green/yellow discharge around the site) Difficulty breathing, headache or visual disturbances Hives Persistent dizziness or light-headedness Extreme fatigue Any other questions or concerns you may have after discharge  In an emergency, call 911 or go to an Emergency Department at a nearby hospital  HELPFUL INFORMATION  If you had a block, it will wear off between 8-24 hrs postop typically.  This is period when your pain may go from nearly zero to the pain you would have had postop without the block.  This is an abrupt transition but nothing dangerous is happening.  You may take an extra dose of narcotic when this happens.  You should wean off your narcotic medicines as soon as you are able.  Most patients will be off or using minimal narcotics before their first postop appointment.   We suggest you use the  pain medication the first night prior to going to bed, in order to ease any pain when the anesthesia wears off. You should avoid taking pain medications on an empty stomach as it will make you nauseous.  Do not drink alcoholic beverages or take illicit drugs when taking pain medications.  In most states it is against the law to drive while you are in a splint or sling.  And certainly against the law to drive while taking narcotics.  You may return to work/school in the next couple of days when you feel up to it.   Pain medication may make you constipated.  Below are a few solutions to try in this order: Decrease the amount of pain medication if you aren't having pain. Drink lots of decaffeinated fluids. Drink prune juice and/or each dried prunes  If the first 3 don't work start with additional solutions Take Colace - an over-the-counter stool softener Take Senokot - an over-the-counter laxative Take Miralax - a stronger over-the-counter laxative     CALL THE OFFICE WITH ANY QUESTIONS OR CONCERNS: [phone]   VISIT OUR WEBSITE FOR ADDITIONAL INFORMATION: [email or web address]   Do not put a pillow under the knee. Place it under the heel.   Complete by: As directed    Flush IV access with Sodium Chloride 0.9% and Heparin 10 units/ml  or 100 units/ml   Complete by: As directed    Home infusion instructions - [facility]   Complete by: As directed    Instructions: Flush IV access with Sodium Chloride 0.9% and Heparin 10units/ml or 100units/ml   Change dressing on IV access line: Weekly and PRN   Instructions Cath Flo $Remove'2mg'qQHDCaz$ : Administer for PICC Line occlusion and as ordered by physician for other access device   [facility] pharmacist to adjust dose for: Vancomycin, Aminoglycosides and other anti-infective therapies as requested by physician   Increase activity slowly as tolerated   Complete by: As directed    Method of administration may be changed at the discretion  of home infusion pharmacist based upon assessment of the patient and/or caregiver's ability to self-administer the medication ordered   Complete by: As directed    Weight bearing as tolerated   Complete by: As directed      Allergies as of 07/21/2020   No Known Allergies     Medication List    STOP taking these medications   atorvastatin 10 MG tablet Commonly known as: LIPITOR   clopidogrel 75 MG tablet Commonly known as: PLAVIX   gabapentin 300 MG capsule Commonly known as: NEURONTIN   Jardiance 25 MG Tabs tablet Generic drug: empagliflozin   Lancets Misc   metFORMIN 500 MG tablet Commonly known as: GLUCOPHAGE     TAKE these medications   acetaminophen 500 MG tablet Commonly known as: TYLENOL Take 1 tablet (500 mg total) by mouth every 8 (eight) hours as needed.   Advil Liqui-Gels minis 200 MG Caps Generic drug: Ibuprofen Take 1 capsule by mouth daily as needed (For knee pain).   aspirin 81 MG EC tablet Take 1 tablet (81 mg total) by mouth daily.   blood glucose meter kit and supplies Dispense based on patient and insurance preference. Use up to four times daily as directed. (FOR ICD-10 E10.9, E11.9).   ceFEPime  IVPB Commonly known as: MAXIPIME Inject 2 g into the vein every 8 (eight) hours. Indication:  Osteomyelitis First Dose: No Last Day of Therapy:  08/08/2020 Labs - Once weekly:  CBC/D and BMP, Labs - Every other week:  ESR and CRP Method of administration: IV Push Method of administration may be changed at the discretion of home infusion pharmacist based upon assessment of the patient and/or caregiver's ability to self-administer the medication ordered.   HYDROcodone-acetaminophen 5-325 MG tablet Commonly known as: NORCO/VICODIN Take 1-2 tablets by mouth every 12 (twelve) hours as needed for moderate pain or severe pain.   lisinopril 5 MG tablet Commonly known as: ZESTRIL Take 1 tablet (5 mg total) by mouth daily.   Vitamin D 125 MCG (5000 UT)  Caps Take 1 capsule by mouth daily.   Voltaren 1 % Gel Generic drug: diclofenac Sodium Apply 2 g topically daily as needed (For knee pain).            Discharge Care Instructions  (From admission, onward)         Start     Ordered   07/21/20 0000  Change dressing on IV access line weekly and PRN  (Home infusion instructions - [facility] )        07/21/20 1054   07/21/20 0000  Weight bearing as tolerated        07/21/20 1103          Follow-up Information    [facility] Follow up.   Why: [facility] will be providing  you with IV antibiotic medication coordination and delivery.  They can be reached at [phone].       [facility] Follow up.   Why: [facility] will be providing you with home health services for registered nurse to assist you with IV administration of your IV antibiotics. Contact information: [phone]       [facility]. Go on 08/15/2020.   Why: Please follow up at the [facility] on August 15, 2020 at 2:30 pm.  Please call the above number if you need to reschedule.   Contact information: [place] [phone] [place], [place] [facility] Follow up.   Why: We are providing you with a 3:1 to your hospital room before you are discharged to home. Contact information: [address] [place] [address] [phone]        [facility] Follow up.   Specialty: Rehabilitation Why: The clinic will call you and set up outpatient therapy at the [facility]. Contact information: [place] [phone] [place] [address] [phone]       Altamese Trinidad, MD. Schedule an appointment as soon as possible for a visit in 2 week(s).   Specialty: Orthopedic Surgery Contact information: [place]  [address] [phone]        Wendie Agreste, MD. Schedule an appointment as soon as possible for a visit in 2 week(s).   Specialties: Family Medicine, Sports Medicine Why: follow up on high blood pressure  Contact information: [place] [address] [phone]        Rosiland Oz, MD. Go on 08/08/2020.   Specialty: Infectious Diseases Why: appointment at 11:15 am. 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  Clean wounds with soap and water only VTE prophylaxis: Pharmacologic prophylaxis not indicated . Pain control: Tylenol and Norco for severe pain.  Okay to use low-dose anti-inflammatory as needed Bone Health/Optimization: Vitamin D supplementation as prescribed.  Recommend follow-up labs in a month Follow - up plan: 2 weeks with orthopedics, 2 weeks with primary care physician to review hypertensive medication and follow-up on 08/08/2020 with infectious disease service to reevaluate need for IV antibiotics Contact information: Altamese Muscoy MD, Ainsley Spinner PA-C   Signed:  Jari Pigg, PA-C [phone] (C) 07/21/2020, 11:03 AM  [facility] [place] [address] [phone] Domingo Sep (F)

## 2020-07-21 NOTE — Progress Notes (Signed)
11/12 Pt is stable. Pt refused cold therapy. Pt educated on the importance of cold therapy. RN will continue to assess pt.

## 2020-07-21 NOTE — Progress Notes (Signed)
Occupational Therapy Treatment Patient Details Name: Stanley Chaney MRN: [id number] DOB: 1961/04/12 Today's Date: 07/21/2020    History of present illness Pt is a 59 y.o. male admitted 07/17/20 with c/o 4 wks of R knee pain. 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 S/p R knee arthrocentesis 11/9. PMH includes PAD, DM.   OT comments  Pt progressing well towards OT goals. Pt able to demo toileting tasks and grooming tasks standing at sink >5 minutes with no LOB. Pt continues to be limited by pain with decreased ability to WB through R LE and tendency to complete mobility NWB with RW. Educated on techniques to increase ease of LB dressing. Pt motivated to go home today, denies concerns and presents with good safety awareness. Pt reports son can assist with ADLs as needed at home.    Follow Up Recommendations  Home health OT;Supervision/Assistance - 24 hour    Equipment Recommendations  3 in 1 bedside commode    Recommendations for Other Services      Precautions / Restrictions Precautions Precautions: Knee Restrictions Weight Bearing Restrictions: Yes RLE Weight Bearing: Weight bearing as tolerated       Mobility Bed Mobility Overal bed mobility: Needs Assistance Bed Mobility: Supine to Sit     Supine to sit: Min assist     General bed mobility comments: Incr time and he tends to use hands to assist RLE off of the bed; VCs for safety as pt scooting hips very close to EOB prior to swinging LEs over edge - reports this is how he's been managing at home   Transfers Overall transfer level: Needs assistance Equipment used: Rolling walker (2 wheeled) Transfers: Sit to/from [facility] Sit to Stand: Supervision Stand pivot transfers: Min guard       General transfer comment: Supervision for sit to stand, min guard for safety while turning      Balance Overall balance assessment: Needs assistance Sitting-balance support: Feet supported Sitting balance-Leahy Scale: Good     Standing balance support: Bilateral upper extremity supported;During functional activity Standing balance-Leahy Scale: Fair Standing balance comment: able to stand at sink to wash hair without support, need for RW/UE with mobility due to pain                           ADL either performed or assessed with clinical judgement   ADL Overall ADL's : Needs assistance/impaired     Grooming: Supervision/safety;Standing;Brushing hair;Oral care;Wash/dry face Grooming Details (indicate cue type and reason): Able to stand at sink for >5 min for brushing teeth, washing face. Tendency to lean forward and rest elbow on sink                  Toilet Transfer: Min guard;Ambulation;Regular Toilet;Grab bars;RW Armed forces technical officer Details (indicate cue type and reason): min guard to ensure safety/balance as pt still tending to avoid full WB through [place] and Hygiene: Supervision/safety;Sit to/from stand;Sitting/lateral lean Toileting - Clothing Manipulation Details (indicate cue type and reason): Able to demo hygiene without assist     Functional mobility during ADLs: Min guard;Rolling walker General ADL Comments: Improving stability with no LOB or safety concerns noted. Pt still limited by pain      Vision   Vision Assessment?: No apparent visual deficits   Perception     Praxis      Cognition  Arousal/Alertness: Awake/alert Behavior During Therapy: WFL for tasks assessed/performed Overall Cognitive Status: Within Functional Limits for tasks assessed                                          Exercises     Shoulder Instructions       General Comments      Pertinent Vitals/ Pain       Pain Assessment: 0-10 Pain Score: 5  Pain Location: R knee Pain Descriptors / Indicators: Operative site  guarding;Discomfort;Sore Pain Intervention(s): Limited activity within patient's tolerance;Monitored during session;Other (comment) (notified RN)  Home Living                                          Prior Functioning/Environment              Frequency  Min 2X/week        Progress Toward Goals  OT Goals(current goals can now be found in the care plan section)  Progress towards OT goals: Progressing toward goals  Acute Rehab OT Goals Patient Stated Goal: go home today OT Goal Formulation: With patient Time For Goal Achievement: 08/02/20 Potential to Achieve Goals: Good ADL Goals Pt Will Perform Grooming: with supervision;sitting;standing Pt Will Perform Lower Body Bathing: sitting/lateral leans;sit to/from stand;with caregiver independent in assisting;with min guard assist Pt Will Perform Lower Body Dressing: with min guard assist;sit to/from stand;sitting/lateral leans Pt Will Transfer to Toilet: with supervision;ambulating Pt Will Perform Toileting - Clothing Manipulation and hygiene: with supervision;sit to/from stand;sitting/lateral leans Pt Will Perform Tub/Shower Transfer: Shower transfer;with min guard assist;ambulating;3 in 1;rolling walker  Plan Discharge plan remains appropriate    Co-evaluation                 AM-PAC OT "6 Clicks" Daily Activity     Outcome Measure   Help from another person eating meals?: None Help from another person taking care of personal grooming?: A Little Help from another person toileting, which includes using toliet, bedpan, or urinal?: A Little Help from another person bathing (including washing, rinsing, drying)?: A Little Help from another person to put on and taking off regular upper body clothing?: A Little Help from another person to put on and taking off regular lower body clothing?: A Little 6 Click Score: 19    End of Session Equipment Utilized During Treatment: Gait belt;Rolling walker  OT  Visit Diagnosis: Other abnormalities of gait and mobility (R26.89);Pain Pain - Right/Left: Right Pain - part of body: Knee   Activity Tolerance Patient tolerated treatment well   Patient Left in chair;with call bell/phone within reach;with chair alarm set   Nurse Communication Mobility status        Time: 9150-5697 OT Time Calculation (min): 42 min  Charges: OT General Charges $OT Visit: 1 Visit OT Treatments $Self Care/Home Management : 38-52 mins  Layla Maw, OTR/L   Layla Maw 07/21/2020, 12:36 PM

## 2020-07-21 NOTE — Progress Notes (Signed)
Patient on gerichair, RN unable to place back pt to bed at this time. She requested IV team to return later to place PICC.

## 2020-07-21 NOTE — TOC Transition Note (Addendum)
Transition of Care [facility]) - CM/SW Discharge Note   Patient Details  Name: Stanley Chaney MRN: [id number] Date of Birth: [birth year]  [facility] CM/SW Contact:  Curlene Labrum, RN Phone Number: 07/21/2020, 12:53 PM   Clinical Narrative:    Case management spoke with Jackelyn Poling, CM at [facility] and the patient's IV medications for home are being delivered to the son's address tonight at 5 pm.  The patient is set up to be seen by [facility] home health for a mock teaching session tonight at 6 pm at the son's home so that the son is able to administer the patient's IV antibiotics through the patient's PICC line scheduled for 10 pm this evening.  The patient received his home medications in the hospital room from [place].  Tu, the primary nurse will be supplying the patient with a 3:1 to go home.  The patient will be transported home by the son this afternoon.  The patient will continue to follow the patient for his discharge to home.  07/21/2020 - 1515 - I called and spoke with the patient's son on the phone and clarified with the son that the home health nurse, Orville Govern RN, will be contacting him tonight at 5 pm - to perform IV antibiotic teaching administration and PICC line care.  The patient's antibiotics will be delivered to the patient's sons home at 5 pm from [facility].  A brochure for [facility] was placed in the room for the son.  I called [facility] and ordered a 3:1 to be delivered to the patient's hospital room in the next 30 minutes prior to discharge.  The primary RN will go over discharge instructions with the patient's son.  Once the son arrives and the PICC line is saline locked the patient will be able to be discharged home with family.     Final next level of care: [place] Barriers to Discharge: Inadequate or no insurance   Patient Goals and CMS Choice Patient states their goals  for this hospitalization and ongoing recovery are:: Patient plans to discharge home to a [place] close to his son's home. CMS Medicare.gov Compare Post Acute Care list provided to:: Patient Choice offered to / list presented to : Patient, Adult Children (son, Eustace Pen speaks fluent [place])  Discharge Placement                       Discharge Plan and Services   Discharge Planning Services: CM Consult Post Acute Care Choice: Durable Medical Equipment, Home Health          DME Arranged: 3-N-1 (Will need 3:1 delivered to room prior to discharge) DME Agency: [facility] Date DME Agency Contacted: 07/20/20 Time DME Agency Contacted: 1208 Representative spoke with at DME Agency: 3:1 to be provided from [place]: RN [place] Agency: [facility] Date [place]: 07/20/20 Time [place]: 1209 Representative spoke with at [place]: Stanton Kidney, [place] with [place]  Social Determinants of Health ([place]) Interventions     Readmission Risk Interventions Readmission Risk Prevention Plan 07/20/2020  Post Dischage Appt Complete  Medication Screening Complete  Transportation Screening Complete  Some recent data might be hidden

## 2020-07-21 NOTE — Progress Notes (Signed)
Physical Therapy Treatment Patient Details Name: Stanley Chaney MRN: [id number] DOB: 06-11-1961 Today's Date: 07/21/2020    History of Present Illness Pt is a 59 y.o. male admitted 07/17/20 with c/o 4 wks of R knee pain. 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 S/p R knee arthrocentesis 11/9. PMH includes PAD, DM.    PT Comments    Pt seated in chair on arrival, agreeable to limited therapy session and c/o fatigue/pain after working with OT recently. Pt given verbal/visual demo as well as handout for stair sequencing/safety using RW and unilateral handrail and verbalizes understanding but not willing to attempt at this time. Pt also given LE HEP handout and demo and encouraged to perform TID as able. Pt perform sit<>stand from chair with Supervision and squat pivot transfer from chair>bed with min guard. Pt performed bed mobility with min guard and cues for self-assist with improved independence. Pt continues to benefit from PT services to progress toward functional mobility goals. D/C recs below remain appropriate.    Follow Up Recommendations  Home health PT;Supervision - Intermittent     Equipment Recommendations  3in1 (PT)    Recommendations for Other Services OT consult     Precautions / Restrictions Precautions Precautions: Knee Restrictions Weight Bearing Restrictions: Yes RLE Weight Bearing: Weight bearing as tolerated    Mobility  Bed Mobility Overal bed mobility: Needs Assistance Bed Mobility: Sit to Supine     Supine to sit: Min guard     General bed mobility comments: Incr time and pt instructed on technique to hook LLE under RLE to assist with transfer  Transfers Overall transfer level: Needs assistance Equipment used: Rolling walker (2 wheeled) Transfers: Squat Pivot Transfers Sit to Stand: Supervision Stand pivot transfers: Min guard Squat  pivot transfers: Min guard     General transfer comment: Supervision for sit to stand, min guard for safety with squat pivot transfer, pt slightly unsafe and sitting prior to reaching close distance to EOB, needs assist to manage IV pole/lines  Ambulation/Gait Ambulation/Gait assistance:  (pt defers gait)               Stairs Stairs: Yes       General stair comments: verbal/visual demo provided of single step in room using RW and single HR, pt reports this is technique he used at home, pt given handout to reinforce proper sequence with photo demonstration as well   Wheelchair Mobility    Modified Rankin (Stroke Patients Only)       Balance Overall balance assessment: Needs assistance Sitting-balance support: Feet supported Sitting balance-Leahy Scale: Good     Standing balance support: Bilateral upper extremity supported;During functional activity Standing balance-Leahy Scale: Fair Standing balance comment: able to stand prior to pivot transfer but decreased safety awareness with lines/IV                            Cognition Arousal/Alertness: Awake/alert Behavior During Therapy: WFL for tasks assessed/performed Overall Cognitive Status: Within Functional Limits for tasks assessed                                        Exercises Other Exercises Other Exercises: pt given LE HEP handout, see Education >PT section for handout    General Comments  Pertinent Vitals/Pain Pain Assessment: 0-10 Pain Score: 5  Pain Location: R knee Pain Descriptors / Indicators: Operative site guarding;Discomfort;Sore Pain Intervention(s): Limited activity within patient's tolerance;Monitored during session;Repositioned (pt refusing ice)   Vitals:   07/21/20 1232  BP: (!) 145/67  Pulse: 61  Resp: 17  Temp: (!) 97.3 F (36.3 C)  SpO2: 97%    Home Living                      Prior Function            PT Goals (current goals  can now be found in the care plan section) Acute Rehab PT Goals Patient Stated Goal: go home today PT Goal Formulation: With patient Time For Goal Achievement: 08/02/20 Potential to Achieve Goals: Good Progress towards PT goals: Progressing toward goals    Frequency    Min 5X/week      PT Plan Current plan remains appropriate    Co-evaluation              AM-PAC PT "6 Clicks" Mobility   Outcome Measure  Help needed turning from your back to your side while in a flat bed without using bedrails?: None Help needed moving from lying on your back to sitting on the side of a flat bed without using bedrails?: None Help needed moving to and from a bed to a chair (including a wheelchair)?: A Little Help needed standing up from a chair using your arms (e.g., wheelchair or bedside chair)?: None Help needed to walk in hospital room?: None Help needed climbing 3-5 steps with a railing? : A Little 6 Click Score: 22    End of Session   Activity Tolerance: Other (comment) (limited participation, pt reports he was recently up w/OT) Patient left: in bed;with call bell/phone within reach;with bed alarm set;Other (comment) (RN notified IV is beeping) Nurse Communication: Mobility status PT Visit Diagnosis: Other abnormalities of gait and mobility (R26.89);Muscle weakness (generalized) (M62.81);Pain Pain - Right/Left: Right Pain - part of body: Knee     Time: 1151-1208 PT Time Calculation (min) (ACUTE ONLY): 17 min  Charges:  $Therapeutic Activity: 8-22 mins                     Laval Cafaro P., PTA Acute Rehabilitation Services Pager: [phone] Office: [place] 07/21/2020, 2:30 PM

## 2020-07-21 NOTE — Progress Notes (Signed)
[facility] Progress Note  Patient ID: Stanley Chaney MRN: [id number] DOB/AGE: 61-09-1960 61 y.o.  Subjective:  Doing well No issues Ready to go home   PICC to be placed today    ROS As above  Objective:   VITALS:   Vitals:   07/20/20 1521 07/20/20 1917 07/21/20 0340 07/21/20 0845  BP: (!) 151/67 (!) 169/77 (!) 152/66 (!) 178/61  Pulse: 61 71 64 (!) 51  Resp: 16 17 17 17   Temp: 98.6 F (37 C) 98.4 F (36.9 C) 99.1 F (37.3 C) 98.3 F (36.8 C)  TempSrc: Oral Oral Oral Oral  SpO2: 97% 97% 97% 99%  Weight:      Height:        Estimated body mass index is 20.09 kg/m as calculated from the following:   Height as of this encounter: 5\' 10"  (1.778 m).   Weight as of this encounter: 63.5 kg.   Intake/Output      11/11 0701 - 11/12 0700 11/12 0701 - 11/13 0700   P.O. 720 300   I.V. (mL/kg) 197.7 (3.1)    IV Piggyback 217.7    Total Intake(mL/kg) 1135.4 (17.9) 300 (4.7)   Urine (mL/kg/hr) 1250 (0.8)    Total Output 1250    Net -114.6 +300          LABS  Results for orders placed or performed during the hospital encounter of 07/17/20 (from the past 24 hour(s))  Glucose, capillary     Status: Abnormal   Collection Time: 07/20/20  3:55 PM  Result Value Ref Range   Glucose-Capillary 159 (H) 70 - 99 mg/dL  Basic metabolic panel     Status: Abnormal   Collection Time: 07/21/20  2:43 AM  Result Value Ref Range   Sodium 141 135 - 145 mmol/L   Potassium 4.6 3.5 - 5.1 mmol/L   Chloride 101 98 - 111 mmol/L   CO2 30 22 - 32 mmol/L   Glucose, Bld 164 (H) 70 - 99 mg/dL   BUN 12 8 - 23 mg/dL   Creatinine, Ser 1.06 0.61 - 1.24 mg/dL   Calcium 9.1 8.9 - 10.3 mg/dL   GFR, Estimated >60 >60 mL/min   Anion gap 10 5 - 15  CBC     Status: Abnormal   Collection Time: 07/21/20  2:43 AM  Result Value Ref Range   WBC 8.0 4.0 - 10.5 K/uL   RBC 3.75 (L) 4.22 - 5.81 MIL/uL   Hemoglobin  10.7 (L) 13.0 - 17.0 g/dL   HCT 33.0 (L) 39 - 52 %   MCV 88.0 80.0 - 100.0 fL   MCH 28.5 26.0 - 34.0 pg   MCHC 32.4 30.0 - 36.0 g/dL   RDW 11.9 11.5 - 15.5 %   Platelets 291 150 - 400 K/uL   nRBC 0.0 0.0 - 0.2 %  Glucose, capillary     Status: Abnormal   Collection Time: 07/21/20  6:52 AM  Result Value Ref Range   Glucose-Capillary 131 (H) 70 - 99 mg/dL     PHYSICAL EXAM:     Gen: awake, resting comfortably in bed, NAD. Moderate sarcopenia  Lungs: unlabored  Cardiac: RRR Abd: + BS, NTND Ext:       Right lower extremity  Dressings R knee are stable              Ext warm              No swelling distally              Motor and sensory functions intact             Ext cool but + DP pulse   Improved ankle and knee ROM   Assessment/Plan: 3 Days Post-Op   Principal Problem:   Septic arthritis of knee, right (HCC) Active Problems:   Diabetes mellitus ([place])   Arthritis of right knee   Osteomyelitis (HCC)   HTN (hypertension)   Rheumatoid factor positive   Vitamin D deficiency   Anti-infectives (From admission, onward)   Start     Dose/Rate Route Frequency Ordered Stop   07/21/20 0600  ceFEPIme (MAXIPIME) 2 g in sodium chloride 0.9 % 100 mL IVPB        2 g 200 mL/hr over 30 Minutes Intravenous Every 8 hours 07/20/20 1420     07/18/20 0015  piperacillin-tazobactam (ZOSYN) IVPB 3.375 g        3.375 g 12.5 mL/hr over 240 Minutes Intravenous Every 8 hours 07/18/20 0009 07/21/20 0418    .  POD/HD#: 30  59 y/o male with 4 week history of R knee pain, loss of knee motion, effusion and inability to bear weight    - chronic R knee arthritis with ? Superimposed septic joint              + pseudomonas              Treat with IV abx x 2-3 weeks the PO FQ as it is sensitive per ID recs                WBAT R leg             ROM as tolerated R knee                + RF, additional labs pending                   - Pain management:             Current regimen  effective  Pt primarily using tylenol over last 36 hours               - ABL anemia/Hemodynamics             Stable   - Medical issues              DM                         CBGs look good                          SSI   HTN   No reported history but he has been persistently hypertensive   Given history of DM will start on low dose ACEi   Follow up with PCP in 2-3 weeks    - DVT/PE prophylaxis:             lovenox while inpatient   No pharmacologics at dc    - ID:             transitioned to cefepime    Duration per ID   Follow up with  ID on 11/30   Awaiting PICC   - Metabolic Bone Disease:             vitamin d deficiency                          Supplement                - Activity:             Up with therapies              WBAT R leg    - FEN/GI prophylaxis/Foley/Lines:             Carb mod diet             Continue with IVF                RD consult appreciated      - Dispo:             TOC to arrange [place] needs  Dc home today   Follow up with ortho in 2-3 weeks, call for appointment   Follow up with ID on 11/30  Follow up with PCP in 2-3 weeks, call for appointment    Jari Pigg, PA-C [phone] (C) 07/21/2020, 10:47 AM  [facility] [place] [address] [phone] Jenetta [phone] (F)    After 5pm and on the weekends please log on to Amion, go to orthopaedics and the look under the [facility] Call for the provider(s) on call. You can also call our office at [phone] and then follow the prompts to be connected to the call team.

## 2020-07-21 NOTE — Plan of Care (Signed)

## 2020-07-22 LAB — CULTURE, BLOOD (ROUTINE X 2)
Culture: NO GROWTH
Culture: NO GROWTH

## 2020-07-24 ENCOUNTER — Telehealth: Payer: Self-pay | Admitting: Family Medicine

## 2020-07-24 LAB — AEROBIC/ANAEROBIC CULTURE W GRAM STAIN (SURGICAL/DEEP WOUND)

## 2020-07-24 NOTE — Telephone Encounter (Signed)
Called  over to Orthopedics where Dr. Marcelino Scot is located to pass Dr.Greene's message to Dr. Marcelino Scot about the pt's culture that showed pseudomonas. I was informed my staff at that location that they will pass this message to Dr. Marcelino Scot.

## 2020-07-24 NOTE — Telephone Encounter (Signed)
Message received from microbiology at [facility].  Patient had synovial fluid analysis in the hospital that was initially read as no organisms on Gram stain, but culture did indicate Pseudomonas.   It appears he was under the care of Dr. Marcelino Scot with [facility]. Please call his office and advise him of these culture results to determine if any changes in plan needed

## 2020-07-25 LAB — FUNGUS CULTURE, BLOOD
Culture: NO GROWTH
Special Requests: ADEQUATE

## 2020-07-30 LAB — HLA-B27 ANTIGEN: HLA-B27: NEGATIVE

## 2020-08-02 ENCOUNTER — Ambulatory Visit: Payer: Self-pay | Admitting: Rehabilitative and Restorative Service Providers"

## 2020-08-05 NOTE — Op Note (Signed)
NAMEStevon, Gough [facility] MEDICAL RECORD [id number] ACCOUNT [id number] DATE OF BIRTH:1961-07-28 FACILITY: [facility] LOCATION: [place], [place]  OPERATIVE REPORT  DATE OF PROCEDURE:  07/18/2020  PREOPERATIVE DIAGNOSIS:  Right knee sepsis.  POSTOPERATIVE DIAGNOSES: 1.  Extensive right knee infection and synovitis. 2.  Grade IV knee arthritis.  PROCEDURES: 1.  Arthroscopic synovectomy superior, medial, and lateral compartments. 2.  Abrasion chondroplasty trochlea, patella, and medial compartment femur and tibia.  SURGEON:  Altamese [place], MD  ASSISTANT:  PA student.  ANESTHESIA:  General.  COMPLICATIONS:  None.  TOURNIQUET:  None.  DISPOSITION:  To PACU.  CONDITION:  Stable.  BRIEF SUMMARY OF INDICATION FOR PROCEDURE:  The patient is a 59 year old who complains of 4 weeks of difficulty walking, painful right knee, which ultimately became so severe he was unable to compensate and stayed home.  He had marked elevations in his  inflammatory markers, and cell count aspiration was initially equivocal, but subsequently significant.  We discussed the risks and benefits of arthroscopic debridement with treatment of intra-articular pathology.  Risks discussed included progression of  arthritis, failure to resolve infection, loss of motion, need for further surgery, DVT, PE, and multiple others.  He acknowledged these risks and strongly wished to proceed.  BRIEF SUMMARY OF PROCEDURE:  The patient was taken to the operating room where general anesthesia was induced.  Preoperative antibiotics were administered as we had already obtained specimens from 2 prior aspirations.  Chlorhexidine wash and then  Betadine scrub and paint were performed.  The portal sites were injected with Marcaine and epinephrine.  The inferolateral and inferomedial portals were established.  The instruments were introduced into the knee.  Upon entering the suprapatellar pouch,   it was obvious there  was considerable infection present with loculated areas and severe synovitis.  The articular cartilage showed marked destruction with grade IV changes along the trochlea, smaller grade IV and grade III changes along the patella and  some grade III changes of the medial femoral condyle.  Surfaces in the lateral compartment were somewhat well preserved by comparison.  We began with the synovectomy using a large arthroscopic wand and the shaver spending considerable time going from  side to side in the suprapatellar pouch removing this, then going down to the medial compartment and the anterior area where the synovitis was directly abrading the articular surface.  After cleaning this extensively, the portal sites were actually  exchanged switching the camera and arthroscopic shaver and wand, which enabled me to get into the lateral compartment and perform synovectomy there as well.  After this was achieved, I then got the arthroscopic shaver and performed shaving of the  articular cartilage along the trochlea and patella back to a smooth stable edge.  There were large flakes of cartilage that required debridement here.  In similar fashion, I went into the medial compartment and performed abrasion chondroplasty of the  medial femoral condyle and articular surface of the tibia, and thankfully, these lesions were much less severe.  The trochlea again was down to eburnated bone.  Lateral compartment meniscus as well as the medial meniscus did not show tearing.  Over 9000  mL of saline were taken through the knee during this.  A drain was placed and then the arthroscopic portals reapproximated with simple nylon sutures following evacuation of the fluid from the knee.  PA student was present and assisting throughout.  PROGNOSIS:  Cultures will be followed up and antibiotics tailored accordingly.  Long-term treatment is anticipated given  the findings.  IN/NUANCE  D:08/05/2020 T:08/05/2020 JOB:[id number]

## 2020-08-06 ENCOUNTER — Encounter: Payer: Self-pay | Admitting: Registered Nurse

## 2020-08-06 NOTE — Progress Notes (Signed)
Acute Office Visit  Subjective:    Patient ID: Stanley Chaney, male    DOB: Jan 18, 1961, 59 y.o.   MRN: [id number]  Chief Complaint  Patient presents with   Foot Pain    Patient states he has been having some right foot pain for a few weeks. Patient states it is getting worse and he can not put pressure on that foot at all.    HPI Patient is in today for pain in both feet, R>L. Ongoing for a few weeks. 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   Vitamin D deficiency 07/21/2020    Past Surgical History:  Procedure Laterality Date   ABDOMINAL AORTOGRAM W/LOWER EXTREMITY Bilateral 06/15/2018   Procedure: ABDOMINAL AORTOGRAM W/LOWER EXTREMITY;  Surgeon: [name], MD;  Location: [place] [facility];  Service: Cardiovascular;  Laterality: Bilateral;   ABDOMINAL AORTOGRAM W/LOWER EXTREMITY Left 12/30/2018   Procedure: ABDOMINAL AORTOGRAM W/LOWER EXTREMITY;  Surgeon: [name], MD;  Location: [place] [facility];  Service: Cardiovascular;  Laterality: Left;   ABDOMINAL AORTOGRAM W/LOWER EXTREMITY N/A 07/10/2020   Procedure: ABDOMINAL AORTOGRAM W/LOWER EXTREMITY;  Surgeon: [name], MD;  Location: [place] [facility];  Service: Cardiovascular;  Laterality: N/A;  Bilateral   I & D EXTREMITY Left 06/12/2018   Procedure: Excisional Debridement left foot;  Surgeon: [name], MD;  Location: [facility];  Service: Orthopedics;  Laterality: Left;   IRRIGATION AND DEBRIDEMENT KNEE Right 07/18/2020   Procedure: IRRIGATION AND DEBRIDEMENT RIGHT KNEE;   Surgeon: Altamese [place], MD;  Location: [place];  Service: Orthopedics;  Laterality: Right;   KNEE ARTHROSCOPY Right 07/18/2020   Procedure: ARTHROSCOPY KNEE SYNOVECTOMY;  Surgeon: Altamese [place], MD;  Location: [place];  Service: Orthopedics;  Laterality: Right;   LOWER EXTREMITY ANGIOGRAM Right 07/20/2018     Right lower extremity angiogram   LOWER EXTREMITY ANGIOGRAPHY Right 07/20/2018   Procedure: Lower Extremity Angiography;  Surgeon: [name], MD;  Location: [place] [facility];  Service: Cardiovascular;  Laterality: Right;   PERIPHERAL VASCULAR ATHERECTOMY Left 06/15/2018   Procedure: PERIPHERAL VASCULAR ATHERECTOMY;  Surgeon: [name], MD;  Location: [place] [facility];  Service: Cardiovascular;  Laterality: Left;  SFA   PERIPHERAL VASCULAR BALLOON ANGIOPLASTY Left 06/15/2018   Procedure: PERIPHERAL VASCULAR BALLOON ANGIOPLASTY;  Surgeon: [name], MD;  Location: [place] [facility];  Service: Cardiovascular;  Laterality: Left;  SFA   PERIPHERAL VASCULAR BALLOON ANGIOPLASTY Right 07/20/2018   Procedure: PERIPHERAL VASCULAR BALLOON ANGIOPLASTY;  Surgeon: [name], MD;  Location: [place] [facility];  Service: Cardiovascular;  Laterality: Right;  SFA WITH DRUG COATED    PERIPHERAL VASCULAR INTERVENTION Left 12/30/2018   Procedure: PERIPHERAL VASCULAR INTERVENTION;  Surgeon: [name], MD;  Location: [place] [facility];  Service: Cardiovascular;  Laterality: Left;  SFA    Family History  Problem Relation Age of Onset   Diabetes Mother    Cancer Mother     Social History   Socioeconomic History   Marital status: Widowed    Spouse name: Not on file  Number of children: Not on file   Years of education: Not on file   Highest education level: Not on file  Occupational History   Not on file  Tobacco Use   Smoking status: Former Smoker    Quit date: 06/14/2018    Years since quitting: 2.1    Smokeless tobacco: Never Used   Tobacco comment: pt reports quitting 6 months ago  Vaping Use   Vaping Use: Never used  Substance and Sexual Activity   Alcohol use: Yes    Alcohol/week: 18.0 standard drinks    Types: 18 Cans of beer per week    Comment: pt reports not drinking for past 6 months   Drug use: Never   Sexual activity: Not on file  Other Topics Concern   Not on file  Social History Narrative   Not on file   Social Determinants of Health   Financial Resource Strain:    Difficulty of Paying Living Expenses: Not on file  Food Insecurity:    Worried About [place] in the Last Year: Not on file   Ran Out of Food in the Last Year: Not on file  Transportation Needs:    Lack of Transportation (Medical): Not on file   Lack of Transportation (Non-Medical): Not on file  Physical Activity:    Days of Exercise per Week: Not on file   Minutes of Exercise per Session: Not on file  Stress:    Feeling of Stress : Not on file  Social Connections:    Frequency of Communication with Friends and Family: Not on file   Frequency of Social Gatherings with Friends and Family: Not on file   Attends Religious Services: Not on file   Active Member of Clubs or Organizations: Not on file   Attends Archivist Meetings: Not on file   Marital Status: Not on file  Intimate Partner Violence:    Fear of Current or Ex-Partner: Not on file   Emotionally Abused: Not on file   Physically Abused: Not on file   Sexually Abused: Not on file    Outpatient Medications Prior to Visit  Medication Sig Dispense Refill   aspirin 81 MG EC tablet Take 1 tablet (81 mg total) by mouth daily. 90 tablet 1   blood glucose meter kit and supplies Dispense based on patient and insurance preference. Use up to four times daily as directed. (FOR ICD-10 E10.9, E11.9). 1 each 0   atorvastatin (LIPITOR) 10 MG tablet Take 1 tablet (10 mg total) by mouth daily. (Patient not  taking: Reported on 06/16/2020) 90 tablet 1   clopidogrel (PLAVIX) 75 MG tablet Take 1 tablet (75 mg total) by mouth daily. (Patient not taking: Reported on 11/17/2019) 30 tablet 6   empagliflozin (JARDIANCE) 25 MG TABS tablet Take 25 mg by mouth daily before breakfast. (Patient not taking: Reported on 06/16/2020) 90 tablet 1   Lancets MISC Inject 1 each into the skin 3 (three) times daily as needed. (Patient not taking: Reported on 07/17/2020) 100 each 3   metFORMIN (GLUCOPHAGE) 500 MG tablet Take 2 tablets (1,000 mg total) by mouth 2 (two) times daily with a meal. i (Patient not taking: Reported on 06/16/2020) 360 tablet 1   No facility-administered medications prior to visit.    No Known Allergies  Review of Systems Per hpi      Objective:    Physical Exam Vitals and nursing note reviewed.  Constitutional:      Appearance: Normal  appearance.  Cardiovascular:     Rate and Rhythm: Normal rate and regular rhythm.  Skin:    General: Skin is warm and dry.     Capillary Refill: Capillary refill takes less than 2 seconds.  Neurological:     General: No focal deficit present.     Mental Status: He is alert and oriented to person, place, and time. Mental status is at baseline.  Psychiatric:        Mood and Affect: Mood normal.        Behavior: Behavior normal.        Thought Content: Thought content normal.        Judgment: Judgment normal.     BP 121/74    Pulse 86    Temp 97.6 F (36.4 C) (Temporal)    Resp 17    Wt 139 lb 3.2 oz (63.1 kg)    SpO2 97%    BMI 19.97 kg/m  Wt Readings from Last 3 Encounters:  07/17/20 140 lb (63.5 kg)  07/10/20 140 lb (63.5 kg)  06/23/20 141 lb 6.4 oz (64.1 kg)    Health Maintenance Due  Topic Date Due   COVID-19 Vaccine (1) Never done   Fecal DNA (Cologuard)  Never done    There are no preventive care reminders to display for this patient.   Lab Results  Component Value Date   TSH 0.590 06/11/2018   Lab Results  Component Value  Date   WBC 8.0 07/21/2020   HGB 10.7 (L) 07/21/2020   HCT 33.0 (L) 07/21/2020   MCV 88.0 07/21/2020   PLT 291 07/21/2020   Lab Results  Component Value Date   NA 141 07/21/2020   K 4.6 07/21/2020   CO2 30 07/21/2020   GLUCOSE 164 (H) 07/21/2020   BUN 12 07/21/2020   CREATININE 1.06 07/21/2020   BILITOT 1.0 07/20/2020   ALKPHOS 106 07/20/2020   AST 18 07/20/2020   ALT 18 07/20/2020   PROT 6.4 (L) 07/20/2020   ALBUMIN 2.2 (L) 07/20/2020   CALCIUM 9.1 07/21/2020   ANIONGAP 10 07/21/2020   Lab Results  Component Value Date   CHOL 213 (H) 11/17/2019   Lab Results  Component Value Date   HDL 68 11/17/2019   Lab Results  Component Value Date   LDLCALC 113 (H) 11/17/2019   Lab Results  Component Value Date   TRIG 187 (H) 11/17/2019   Lab Results  Component Value Date   CHOLHDL 3.1 11/17/2019   Lab Results  Component Value Date   HGBA1C 7.7 (H) 07/18/2020       Assessment & Plan:   Problem List Items Addressed This Visit    None    Visit Diagnoses    Flu vaccine need    -  Primary   Relevant Orders   Flu Vaccine QUAD 6+ mos PF IM (Fluarix Quad PF) (Completed)   Pain in both lower extremities           Meds ordered this encounter  Medications   DISCONTD: gabapentin (NEURONTIN) 300 MG capsule    Sig: Take 1 capsule (300 mg total) by mouth 3 (three) times daily.    Dispense:  90 capsule    Refill:  3    Order Specific Question:   Supervising Provider    Answer:   Carlota Raspberry, JEFFREY R [[id number]]   PLAN  Suspect diabetic neuropathy  Gabapentin $RemoveBef'300mg'[id number]$  PO tid PRN  Pt declines lab work today as he does not currently  have insurance  Patient encouraged to call clinic with any questions, comments, or concerns.   Maximiano Coss, NP

## 2020-08-08 ENCOUNTER — Ambulatory Visit (INDEPENDENT_AMBULATORY_CARE_PROVIDER_SITE_OTHER): Payer: HRSA Program

## 2020-08-08 ENCOUNTER — Ambulatory Visit (INDEPENDENT_AMBULATORY_CARE_PROVIDER_SITE_OTHER): Payer: Self-pay | Admitting: Infectious Diseases

## 2020-08-08 ENCOUNTER — Telehealth: Payer: Self-pay

## 2020-08-08 ENCOUNTER — Encounter: Payer: Self-pay | Admitting: Infectious Diseases

## 2020-08-08 ENCOUNTER — Other Ambulatory Visit: Payer: Self-pay

## 2020-08-08 VITALS — BP 149/70 | HR 69 | Temp 97.3°F

## 2020-08-08 DIAGNOSIS — K029 Dental caries, unspecified: Secondary | ICD-10-CM

## 2020-08-08 DIAGNOSIS — Z23 Encounter for immunization: Secondary | ICD-10-CM

## 2020-08-08 DIAGNOSIS — M009 Pyogenic arthritis, unspecified: Secondary | ICD-10-CM

## 2020-08-08 NOTE — Progress Notes (Addendum)
[facility]                                                             [place], [place], [place], [address]                                                                  Phn. 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 Unclear significance. Would recommend to follow with PCP.   Sweats/headache/dental pain - will get a blood cx per patient's request. He does have rotten teeth and might be the cause of diffuse headache. Advised to see a dentist   Diabetes Mellitus  PAD  Plan I was planning to switch to Cipro today but will continue cefepime for now given patient complaints of worsening pain although I did not find any significant changes on physical exam of RT knee.  He is going to see Orthopedics Dr Marcelino Scot tomorrow. Will follow their recs Follow up in 3 weeks   Orders Placed This Encounter  Procedures  . Blood culture (routine single)  . Blood culture (routine single)  . CBC w/Diff  . COMPLETE METABOLIC PANEL WITH GFR  . Sedimentation rate  . C-reactive protein   I spent greater than 25 minutes with the patient including  review of prior medical records with greater than 50% of time in face to face counsel of the patient.    Rosiland Oz, [facility]  Office phone [phone] Fax no. [phone] ______________________________________________________________________________________________________________________  HPI: 59 Y O Male originally from [place] with a  remote h/o fracture of ? Rt knee at the age of 21 + bone infection( per patient) including DM, PAD and arthritis  with recent hospital admission ( 11/8-11/12/21) for Rt knee septic arthritis who is here for a follow up. Patient initially  presented with complaints of Rt leg pain/swelling and stiffness for 5+weeks.Chills +. Synovial fluid cx 07/17/20 with Pseudomonas aeruginosa. Repeat Synovial Fluid analysis at OR along with OR findings s/o septic arthritis with loculated effusion and cx growing Pseudomonas aeruginosa. 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 ESR and CRP remarkable high. Denies any IVDU, trauma, prior joint injections.  He is accompanied by his son today who is helping with the interpretation. They say patient is  having significant pain at his rt knee and he says it is worsening than before. He denies any fevers, chills but complains of sweating. However, son says his room temperature is at 79F. He is also complaining of headache, dental pain and was concerned if he had an infection in his head. Denies any dizziness, new weakness, numbness and tingling. Denies any neck stiffness. Denies any issues with the PICC line. Denies any side effects with the IV cefepime. His son lives closeby and help with the IV abx. He is supposed to see the Orthopedics office tomorrow.   ROS: Constitutional: Negative for fever, chills, activity change, appetite change, fatigue and unexpected weight change.  HENT: Negative for congestion, sore throat, rhinorrhea, sneezing, trouble swallowing and sinus pressure.  Eyes: Negative for photophobia and visual disturbance.  Respiratory: Negative for cough, chest tightness, shortness of breath, wheezing and stridor.  Cardiovascular: Negative for chest pain, palpitations and leg swelling.  Gastrointestinal: Negative for nausea, vomiting, abdominal pain, diarrhea,  blood in stool, abdominal distention and anal bleeding.  CONSTIPATION+ Genitourinary: Negative for dysuria, hematuria, flank pain and difficulty urinating.  Musculoskeletal: Negative for myalgias, back pain, RT KNEE PAIN/SWELLING AND DIFFICULTY WITH GAIT  Skin: Negative for color change, pallor, rash and wound.  Neurological: Negative for dizziness, tremors, weakness and light-headedness.  Hematological: Negative for adenopathy. Does not bruise/bleed easily.  Psychiatric/Behavioral: Negative for behavioral problems, confusion, sleep disturbance, dysphoric mood, decreased concentration and agitation.   Past Medical History:  Diagnosis Date  . Arthritis of right knee 07/19/2020  . Diabetes mellitus without complication ([place])   . PAD (peripheral artery disease) ([place])   . Rheumatoid factor positive 07/21/2020  . Vitamin D deficiency 07/21/2020   Past Surgical History:  Procedure Laterality Date  . ABDOMINAL AORTOGRAM W/LOWER EXTREMITY Bilateral 06/15/2018   Procedure: ABDOMINAL AORTOGRAM W/LOWER EXTREMITY;  Surgeon: [name], MD;  Location: [facility];  Service: Cardiovascular;  Laterality: Bilateral;  . ABDOMINAL AORTOGRAM W/LOWER EXTREMITY Left 12/30/2018   Procedure: ABDOMINAL AORTOGRAM W/LOWER EXTREMITY;  Surgeon: [name], MD;  Location: [facility];  Service: Cardiovascular;  Laterality: Left;  . ABDOMINAL AORTOGRAM W/LOWER EXTREMITY N/A 07/10/2020   Procedure: ABDOMINAL AORTOGRAM W/LOWER EXTREMITY;  Surgeon: [name], MD;  Location: [facility];  Service: Cardiovascular;  Laterality: N/A;  Bilateral  . I & D EXTREMITY Left 06/12/2018   Procedure: Excisional Debridement left foot;  Surgeon: [name], MD;  Location: [facility];  Service: Orthopedics;  Laterality: Left;  . IRRIGATION AND DEBRIDEMENT KNEE Right 07/18/2020   Procedure: IRRIGATION AND DEBRIDEMENT RIGHT KNEE;  Surgeon: Altamese Landen, MD;  Location: [facility];  Service: Orthopedics;  Laterality: Right;  . KNEE  ARTHROSCOPY Right 07/18/2020   Procedure: ARTHROSCOPY KNEE SYNOVECTOMY;  Surgeon: Altamese Clare, MD;  Location: [facility];  Service: Orthopedics;  Laterality: Right;  . LOWER EXTREMITY ANGIOGRAM Right 07/20/2018     Right lower extremity angiogram  . LOWER EXTREMITY ANGIOGRAPHY Right 07/20/2018   Procedure: Lower Extremity Angiography;  Surgeon: [name], MD;  Location: [facility];  Service: Cardiovascular;  Laterality: Right;  . PERIPHERAL VASCULAR ATHERECTOMY Left 06/15/2018   Procedure: PERIPHERAL VASCULAR ATHERECTOMY;  Surgeon: [name], MD;  Location: [facility];  Service: Cardiovascular;  Laterality: Left;  SFA  . PERIPHERAL VASCULAR BALLOON ANGIOPLASTY Left 06/15/2018   Procedure: PERIPHERAL VASCULAR BALLOON ANGIOPLASTY;  Surgeon: [name], MD;  Location: [facility];  Service: Cardiovascular;  Laterality:  Left;  SFA  . PERIPHERAL VASCULAR BALLOON ANGIOPLASTY Right 07/20/2018   Procedure: PERIPHERAL VASCULAR BALLOON ANGIOPLASTY;  Surgeon: [name], MD;  Location: [facility];  Service: Cardiovascular;  Laterality: Right;  SFA WITH DRUG COATED   . PERIPHERAL VASCULAR INTERVENTION Left 12/30/2018   Procedure: PERIPHERAL VASCULAR INTERVENTION;  Surgeon: [name], MD;  Location: [facility];  Service: Cardiovascular;  Laterality: Left;  SFA   Current Outpatient Medications on File Prior to Visit  Medication Sig Dispense Refill  . acetaminophen (TYLENOL) 500 MG tablet Take 1 tablet (500 mg total) by mouth every 8 (eight) hours as needed. 60 tablet 0  . aspirin 81 MG EC tablet Take 1 tablet (81 mg total) by mouth daily. 90 tablet 1  . blood glucose meter kit and supplies Dispense based on patient and insurance preference. Use up to four times daily as directed. (FOR ICD-10 E10.9, E11.9). 1 each 0  . ceFEPime (MAXIPIME) IVPB Inject 2 g into the vein every 8 (eight) hours. Indication:   Osteomyelitis First Dose: No Last Day of Therapy:  08/08/2020 Labs - Once weekly:  CBC/D and BMP, Labs - Every other week:  ESR and CRP Method of administration: IV Push Method of administration may be changed at the discretion of home infusion pharmacist based upon assessment of the patient and/or caregiver's ability to self-administer the medication ordered. 36 Units 0  . Cholecalciferol 1.25 MG (50000 UT) TABS TAKE 1 TABLET BY MOUTH DAILY.    [name] diclofenac Sodium (VOLTAREN) 1 % GEL Apply 2 g topically daily as needed (For knee pain).    [name] HYDROcodone-acetaminophen (NORCO/VICODIN) 5-325 MG tablet Take 1-2 tablets by mouth every 12 (twelve) hours as needed for moderate pain or severe pain. 30 tablet 0  . Ibuprofen (ADVIL LIQUI-GELS MINIS) 200 MG CAPS Take 1 capsule by mouth daily as needed (For knee pain).    [name] lisinopril (ZESTRIL) 5 MG tablet Take 1 tablet (5 mg total) by mouth daily. 30 tablet 1   Current Facility-Administered Medications on File Prior to Visit  Medication Dose Route Frequency Provider Last Rate Last Admin  . atorvastatin (LIPITOR) tablet 10 mg  10 mg Oral Daily Setzer, Edman Circle, PA-C       Social History   Socioeconomic History  . Marital status: Widowed    Spouse name: Not on file  . Number of children: Not on file  . Years of education: Not on file  . Highest education level: Not on file  Occupational History  . Not on file  Tobacco Use  . Smoking status: Former Smoker    Quit date: 06/14/2018    Years since quitting: 2.1  . Smokeless tobacco: Never Used  . Tobacco comment: pt reports quitting 6 months ago  Vaping Use  . Vaping Use: Never used  Substance and Sexual Activity  . Alcohol use: Yes    Alcohol/week: 18.0 standard drinks    Types: 18 Cans of beer per week    Comment: pt reports not drinking for past 6 months  . Drug use: Never  . Sexual activity: Not on file  Other Topics Concern  . Not on file  Social History Narrative  . Not on file    Social Determinants of Health   Financial Resource Strain:   . Difficulty of Paying Living Expenses: Not on file  Food Insecurity:   . Worried About Charity fundraiser in the Last Year: Not on file  .  Ran Out of Food in the Last Year: Not on file  Transportation Needs:   . Lack of Transportation (Medical): Not on file  . Lack of Transportation (Non-Medical): Not on file  Physical Activity:   . Days of Exercise per Week: Not on file  . Minutes of Exercise per Session: Not on file  Stress:   . Feeling of Stress : Not on file  Social Connections:   . Frequency of Communication with Friends and Family: Not on file  . Frequency of Social Gatherings with Friends and Family: Not on file  . Attends Religious Services: Not on file  . Active Member of Clubs or Organizations: Not on file  . Attends Archivist Meetings: Not on file  . Marital Status: Not on file  Intimate Partner Violence:   . Fear of Current or Ex-Partner: Not on file  . Emotionally Abused: Not on file  . Physically Abused: Not on file  . Sexually Abused: Not on file    Vitals BP (!) 149/70   Pulse 69   Temp (!) 97.3 F (36.3 C) (Oral)    Examination  General - not in acute distress, LOOKS FRUSTRATED DU E TO RT KNEE PAIN, SITTING IN A WHEEL CHAIR  HEENT - PEERLA, no pallor and no icterus, ROTTEN TEETH +, NO NECK STIFFNESS Chest - b/l clear air entry, no additional sounds CVS- Normal s1s2, RRR Abdomen - Soft, Non tender , non distended Ext- no pedal edema RT KNEE- NO WARMTH/SWELLING OR TENDERNESS NOTED, LIMITED ROM DUE TO PAIN  Neuro: grossly normal Back - WNL Psych : calm and cooperative   Recent labs CBC Latest Ref Rng & Units 07/21/2020 07/20/2020 07/19/2020  WBC 4.0 - 10.5 K/uL 8.0 6.8 6.3  Hemoglobin 13.0 - 17.0 g/dL 10.7(L) 11.0(L) 11.6(L)  Hematocrit 39 - 52 % 33.0(L) 34.0(L) 37.0(L)  Platelets 150 - 400 K/uL 291 259 305   CMP Latest Ref Rng & Units 07/21/2020 07/20/2020 07/19/2020   Glucose 70 - 99 mg/dL 164(H) 169(H) 83  BUN 8 - 23 mg/dL _0 Creatinine 0.61 - 1.24 mg/dL 1.06 1.05 0.74  Sodium 135 - 145 mmol/L 141 140 142  Potassium 3.5 - 5.1 mmol/L 4.6 4.3 4.1  Chloride 98 - 111 mmol/L 101 100 101  CO2 22 - 32 mmol/L _1 Calcium 8.9 - 10.3 mg/dL 9.1 8.8(L) 9.1  Total Protein 6.5 - 8.1 g/dL - 6.4(L) -  Total Bilirubin 0.3 - 1.2 mg/dL - 1.0 -  Alkaline Phos 38 - 126 U/L - 106 -  AST 15 - 41 U/L - 18 -  ALT 0 - 44 U/L - 18 -     Pertinent Microbiology Results for orders placed or performed during the hospital encounter of 07/17/20  Culture, blood (Routine x 2)     Status: None   Collection Time: 07/17/20  6:01 PM   Specimen: BLOOD RIGHT ARM  Result Value Ref Range Status   Specimen Description BLOOD RIGHT ARM  Final   Special Requests   Final    BOTTLES DRAWN AEROBIC AND ANAEROBIC Blood Culture results may not be optimal due to an inadequate volume of blood received in culture bottles   Culture   Final    NO GROWTH 5 DAYS Performed at [facility], [place] [address], [place], [place] [address]    Report Status 07/22/2020 FINAL  Final  Culture, blood (Routine x 2)     Status: None  Collection Time: 07/17/20  6:10 PM   Specimen: BLOOD LEFT ARM  Result Value Ref Range Status   Specimen Description BLOOD LEFT ARM  Final   Special Requests   Final    BOTTLES DRAWN AEROBIC AND ANAEROBIC Blood Culture results may not be optimal due to an inadequate volume of blood received in culture bottles   Culture   Final    NO GROWTH 5 DAYS Performed at [facility], [place] [address] [place], [place] [address]    Report Status 07/22/2020 FINAL  Final  Body fluid culture     Status: None   Collection Time: 07/17/20  7:31 PM   Specimen: Synovium; Body Fluid  Result Value Ref Range Status   Specimen Description SYNOVIAL FLUID KNEE  Final   Special Requests NONE  Final   Gram Stain   Final    FEW WBC PRESENT,BOTH PMN AND MONONUCLEAR NO ORGANISMS  SEEN    Culture   Final    RARE PSEUDOMONAS AERUGINOSA CRITICAL RESULT CALLED TO, READ BACK BY AND VERIFIED WITH: RN J.LION AT 1140 ON 07/18/2020 BY T.SAAD Performed at [facility], [place] [address] [place], [place] [address]    Report Status 07/20/2020 FINAL  Final   Organism ID, Bacteria PSEUDOMONAS AERUGINOSA  Final      Susceptibility   Pseudomonas aeruginosa - MIC*    CEFTAZIDIME 4 SENSITIVE Sensitive     CIPROFLOXACIN <=0.25 SENSITIVE Sensitive     GENTAMICIN <=1 SENSITIVE Sensitive     IMIPENEM 2 SENSITIVE Sensitive     PIP/TAZO 8 SENSITIVE Sensitive     CEFEPIME 2 SENSITIVE Sensitive     * RARE PSEUDOMONAS AERUGINOSA  Acid Fast Smear (AFB)     Status: None   Collection Time: 07/17/20  7:31 PM   Specimen: Synovial, Right Knee; Synovial Fluid  Result Value Ref Range Status   AFB Specimen Processing Concentration  Final   Acid Fast Smear Negative  Final    Comment: (NOTE) Performed At: [facility] [place], [place] [id number] Rush Farmer MD FY:[id number]    Source (AFB) FLUID  Final    Comment: SYNOVIAL RIGHT KNEE Performed at [facility], [place] [address], [place], [place] [address]   Fungus Culture With Stain     Status: None (Preliminary result)   Collection Time: 07/17/20  7:31 PM   Specimen: Synovial Fluid  Result Value Ref Range Status   Fungus Stain Final report  Final    Comment: (NOTE) Performed At: [facility] [place], [place] [id number] Rush Farmer MD ER:[id number]    Fungus (Mycology) Culture PENDING  Incomplete   Fungal Source FLUID  Final    Comment: SYNOVIAL RIGHT KNEE Performed at [facility], [place] [address], [place], [place] [address]   Fungus Culture Result     Status: None   Collection Time: 07/17/20  7:31 PM  Result Value Ref Range Status   Result 1 Comment  Final    Comment: (NOTE) KOH/Calcofluor preparation:  no fungus observed. Performed At: [facility] [place], [place] [id number] Rush Farmer MD DX:[id number]   Fungus culture, blood     Status: None   Collection Time: 07/18/20 12:13 AM   Specimen: BLOOD  Result Value Ref Range Status   Specimen Description BLOOD LEFT ANTECUBITAL  Final   Special Requests   Final    BOTTLES DRAWN AEROBIC AND ANAEROBIC Blood Culture adequate volume  Culture   Final    NO GROWTH 7 DAYS NO FUNGUS ISOLATED Performed at [facility], [place] [address], [place], [place] [address]    Report Status 07/25/2020 FINAL  Final  Respiratory Panel by RT PCR (Flu A&B, Covid) - Nasopharyngeal Swab     Status: None   Collection Time: 07/18/20 12:24 AM   Specimen: Nasopharyngeal Swab  Result Value Ref Range Status   SARS Coronavirus 2 by RT PCR NEGATIVE NEGATIVE Final    Comment: (NOTE) SARS-CoV-2 target nucleic acids are NOT DETECTED.  The SARS-CoV-2 RNA is generally detectable in upper respiratoy specimens during the acute phase of infection. The lowest concentration of SARS-CoV-2 viral copies this assay can detect is 131 copies/mL. A negative result does not preclude SARS-Cov-2 infection and should not be used as the sole basis for treatment or other patient management decisions. A negative result may occur with  improper specimen collection/handling, submission of specimen other than nasopharyngeal swab, presence of viral mutation(s) within the areas targeted by this assay, and inadequate number of viral copies (<131 copies/mL). A negative result must be combined with clinical observations, patient history, and epidemiological information. The expected result is Negative.  Fact Sheet for Patients:  PinkCheek.be  Fact Sheet for Healthcare Providers:  GravelBags.it  This test is no t yet approved or cleared by the [place] FDA and  has been authorized for detection and/or diagnosis of SARS-CoV-2 by FDA under an  Emergency Use Authorization (EUA). This EUA will remain  in effect (meaning this test can be used) for the duration of the COVID-19 declaration under Section 564(b)(1) of the Act, 21 U.S.C. section 360bbb-3(b)(1), unless the authorization is terminated or revoked sooner.     Influenza A by PCR NEGATIVE NEGATIVE Final   Influenza B by PCR NEGATIVE NEGATIVE Final    Comment: (NOTE) The Xpert Xpress SARS-CoV-2/FLU/RSV assay is intended as an aid in  the diagnosis of influenza from Nasopharyngeal swab specimens and  should not be used as a sole basis for treatment. Nasal washings and  aspirates are unacceptable for Xpert Xpress SARS-CoV-2/FLU/RSV  testing.  Fact Sheet for Patients: PinkCheek.be  Fact Sheet for Healthcare Providers: GravelBags.it  This test is not yet approved or cleared by the [place] FDA and  has been authorized for detection and/or diagnosis of SARS-CoV-2 by  FDA under an Emergency Use Authorization (EUA). This EUA will remain  in effect (meaning this test can be used) for the duration of the  Covid-19 declaration under Section 564(b)(1) of the Act, 21  U.S.C. section 360bbb-3(b)(1), unless the authorization is  terminated or revoked. Performed at [facility], [place] [address], [place], [place] [address]   Fungus Culture With Stain     Status: None (Preliminary result)   Collection Time: 07/18/20 12:02 PM   Specimen: Synovial, Right Knee; Synovial Fluid  Result Value Ref Range Status   Fungus Stain Final report  Final    Comment: (NOTE) Performed At: [facility] [place], [place] [id number] Rush Farmer MD AC:[id number]    Fungus (Mycology) Culture PENDING  Incomplete   Fungal Source SYNOVIAL  Final    Comment: FLUID RIGHT KNEE Performed at [facility], [place] [address], [place], [place] [address]   Acid Fast Smear (AFB)     Status: None   Collection Time:  07/18/20 12:02 PM   Specimen: Synovial, Right Knee; Synovial Fluid  Result Value Ref Range Status   AFB Specimen  Processing Concentration  Final   Acid Fast Smear Negative  Final    Comment: (NOTE) Performed At: [facility] [place], [place] [id number] Rush Farmer MD FU:[id number]    Source (AFB) SYNOVIAL  Final    Comment: FLUID RIGHT KNEE Performed at [facility], [place] [address], [place], [place] [address]   Aerobic/Anaerobic Culture (surgical/deep wound)     Status: None   Collection Time: 07/18/20 12:02 PM   Specimen: Synovium  Result Value Ref Range Status   Specimen Description SYNOVIAL FLUID  Final   Special Requests RIGHT KNEE  Final   Gram Stain   Final    ABUNDANT WBC PRESENT,BOTH PMN AND MONONUCLEAR NO ORGANISMS SEEN    Culture   Final    ABUNDANT PSEUDOMONAS AERUGINOSA NO ANAEROBES ISOLATED CRITICAL VALUE NOTED.  VALUE IS CONSISTENT WITH PREVIOUSLY REPORTED AND CALLED VALUE. REGARDING CULTURE GROWTH Performed at [facility], [place] [address], [place], [place] [address]    Report Status 07/24/2020 FINAL  Final   Organism ID, Bacteria PSEUDOMONAS AERUGINOSA  Final      Susceptibility   Pseudomonas aeruginosa - MIC*    CEFTAZIDIME 4 SENSITIVE Sensitive     CIPROFLOXACIN <=0.25 SENSITIVE Sensitive     GENTAMICIN <=1 SENSITIVE Sensitive     IMIPENEM 1 SENSITIVE Sensitive     PIP/TAZO 8 SENSITIVE Sensitive     CEFEPIME 2 SENSITIVE Sensitive     * ABUNDANT PSEUDOMONAS AERUGINOSA  Fungus Culture Result     Status: None   Collection Time: 07/18/20 12:02 PM  Result Value Ref Range Status   Result 1 Comment  Final    Comment: (NOTE) KOH/Calcofluor preparation:  no fungus observed. Performed At: [facility] [place], [place] [id number] Rush Farmer MD OH:[id number]       All pertinent labs/Imagings/notes reviewed. All pertinent plain films and CT images have been personally visualized and  interpreted; radiology reports have been reviewed. Decision making incorporated into the Impression / Recommendations.

## 2020-08-08 NOTE — Telephone Encounter (Signed)
Verbal orders given to Amy with [facility] to continue IV antibiotics for 3 more weeks per Dr. [place]. Amy verbalized understanding. Baleria Wyman T Brooks Sailors

## 2020-08-08 NOTE — Assessment & Plan Note (Signed)
Advised to see a dentist

## 2020-08-08 NOTE — Progress Notes (Signed)
   Covid-19 Vaccination Clinic  Name:  Stanley Chaney    MRN: [id number] DOB: Aug 18, 1961  08/08/2020  Stanley Chaney was observed post Covid-19 immunization for 15 minutes without incident. He was provided with Vaccine Information Sheet and instruction to access the V-Safe system.   Stanley Chaney was instructed to call 911 with any severe reactions post vaccine: [name] Difficulty breathing  . Swelling of face and throat  . A fast heartbeat  . A bad rash all over body  . Dizziness and weakness   Immunizations Administered    Name Date Dose VIS Date Route   Pfizer COVID-19 Vaccine 08/08/2020 12:12 PM 0.3 mL 06/28/2020 Intramuscular   Manufacturer: [place]   Lot: [id number]   NDC: [place]

## 2020-08-08 NOTE — Assessment & Plan Note (Signed)
Continue cefepime for now Follow up Ortho recs

## 2020-08-09 ENCOUNTER — Encounter (HOSPITAL_COMMUNITY): Payer: Self-pay

## 2020-08-09 ENCOUNTER — Ambulatory Visit: Payer: MEDICAID | Admitting: Physical Therapy

## 2020-08-09 ENCOUNTER — Encounter: Payer: Self-pay | Admitting: Infectious Diseases

## 2020-08-09 ENCOUNTER — Other Ambulatory Visit: Payer: Self-pay | Admitting: Infectious Diseases

## 2020-08-09 LAB — CBC WITH DIFFERENTIAL/PLATELET
Absolute Monocytes: 620 cells/uL (ref 200–950)
Basophils Absolute: 53 cells/uL (ref 0–200)
Basophils Relative: 0.8 %
Eosinophils Absolute: 231 cells/uL (ref 15–500)
Eosinophils Relative: 3.5 %
HCT: 37.4 % — ABNORMAL LOW (ref 38.5–50.0)
Hemoglobin: 12.3 g/dL — ABNORMAL LOW (ref 13.2–17.1)
Lymphs Abs: 1947 cells/uL (ref 850–3900)
MCH: 27.5 pg (ref 27.0–33.0)
MCHC: 32.9 g/dL (ref 32.0–36.0)
MCV: 83.5 fL (ref 80.0–100.0)
MPV: 9.8 fL (ref 7.5–12.5)
Monocytes Relative: 9.4 %
Neutro Abs: 3749 cells/uL (ref 1500–7800)
Neutrophils Relative %: 56.8 %
Platelets: 455 10*3/uL — ABNORMAL HIGH (ref 140–400)
RBC: 4.48 10*6/uL (ref 4.20–5.80)
RDW: 12.6 % (ref 11.0–15.0)
Total Lymphocyte: 29.5 %
WBC: 6.6 10*3/uL (ref 3.8–10.8)

## 2020-08-09 LAB — COMPLETE METABOLIC PANEL WITH GFR
AG Ratio: 1.2 (calc) (ref 1.0–2.5)
ALT: 17 U/L (ref 9–46)
AST: 15 U/L (ref 10–35)
Albumin: 4.4 g/dL (ref 3.6–5.1)
Alkaline phosphatase (APISO): 169 U/L — ABNORMAL HIGH (ref 35–144)
BUN: 16 mg/dL (ref 7–25)
CO2: 28 mmol/L (ref 20–32)
Calcium: 10.3 mg/dL (ref 8.6–10.3)
Chloride: 100 mmol/L (ref 98–110)
Creat: 0.73 mg/dL (ref 0.70–1.25)
GFR, Est African American: 116 mL/min/{1.73_m2} (ref >=60)
GFR, Est Non African American: 100 mL/min/{1.73_m2} (ref >=60)
Globulin: 3.8 g/dL (calc) — ABNORMAL HIGH (ref 1.9–3.7)
Glucose, Bld: 174 mg/dL — ABNORMAL HIGH (ref 65–99)
Potassium: 4.4 mmol/L (ref 3.5–5.3)
Sodium: 140 mmol/L (ref 135–146)
Total Bilirubin: 0.4 mg/dL (ref 0.2–1.2)
Total Protein: 8.2 g/dL — ABNORMAL HIGH (ref 6.1–8.1)

## 2020-08-09 LAB — SEDIMENTATION RATE: Sed Rate: 128 mm/h — ABNORMAL HIGH (ref 0–20)

## 2020-08-09 LAB — C-REACTIVE PROTEIN: CRP: 64.5 mg/L — ABNORMAL HIGH (ref <8.0)

## 2020-08-09 NOTE — Progress Notes (Signed)
Labs  07/25/20 Cr 0.55, WBC 8.5, Hb 11.3, platelets 515 08/01/20 Cr 0.56, CRP 87, ESR 124 WBC 6.7, hb 11.9, platelets 647

## 2020-08-09 NOTE — Progress Notes (Signed)
Labs   07/25/20 Cr 0.55, WBC 8.5, hb 11.3, HCT 34.2  platelets 515

## 2020-08-14 ENCOUNTER — Encounter: Payer: Self-pay | Admitting: Infectious Diseases

## 2020-08-14 LAB — CULTURE, BLOOD (SINGLE)
MICRO NUMBER:: 11268562
MICRO NUMBER:: 11268563
Result:: NO GROWTH
Result:: NO GROWTH
SPECIMEN QUALITY:: ADEQUATE
SPECIMEN QUALITY:: ADEQUATE

## 2020-08-14 NOTE — Progress Notes (Signed)
Labs  08/01/20 Na 141, k 5.3, Cr 0.56, CRP 87, ESR 124                WBC 6.7, hb 11.9, plts 647

## 2020-08-15 ENCOUNTER — Inpatient Hospital Stay: Payer: Self-pay | Admitting: Family Medicine

## 2020-08-16 ENCOUNTER — Other Ambulatory Visit: Payer: Self-pay | Admitting: Orthopedic Surgery

## 2020-08-16 DIAGNOSIS — M25569 Pain in unspecified knee: Secondary | ICD-10-CM

## 2020-08-16 LAB — FUNGUS CULTURE WITH STAIN

## 2020-08-16 LAB — FUNGUS CULTURE RESULT

## 2020-08-16 LAB — FUNGAL ORGANISM REFLEX

## 2020-08-18 LAB — FUNGUS CULTURE RESULT

## 2020-08-18 LAB — FUNGUS CULTURE WITH STAIN

## 2020-08-18 LAB — FUNGAL ORGANISM REFLEX

## 2020-08-21 ENCOUNTER — Ambulatory Visit: Payer: MEDICAID | Admitting: Physical Therapy

## 2020-08-24 ENCOUNTER — Ambulatory Visit: Payer: Self-pay | Admitting: Infectious Diseases

## 2020-08-28 ENCOUNTER — Other Ambulatory Visit: Payer: Self-pay | Admitting: Orthopedic Surgery

## 2020-08-28 ENCOUNTER — Telehealth: Payer: Self-pay | Admitting: *Deleted

## 2020-08-28 DIAGNOSIS — M00861 Arthritis due to other bacteria, right knee: Secondary | ICD-10-CM

## 2020-08-28 DIAGNOSIS — M009 Pyogenic arthritis, unspecified: Secondary | ICD-10-CM

## 2020-08-28 MED ORDER — CIPROFLOXACIN HCL 750 MG PO TABS: 750.0000 mg | ORAL_TABLET | Freq: Two times a day (BID) | ORAL | 0 refills | Status: AC

## 2020-08-28 NOTE — Addendum Note (Signed)
Addended by: Landis Gandy on: 08/28/2020 03:40 PM   Modules accepted: Orders

## 2020-08-28 NOTE — Telephone Encounter (Signed)
Received voicemail from [place] at [facility]. Patient had IV antibiotic extended 3 more weeks 11/30, is scheduled to end tomorrow 12/21. He missed follow up office visit 12/16, but is being seen 1/5.  ESR and CRP drawn 12/14 were still elevated (59 and 13 respectively). They will be added to this week's labs if possible. Please advise if should continue antibiotics, ok to pull, or if you need any other labs. Landis Gandy, RN

## 2020-08-28 NOTE — Telephone Encounter (Signed)
OK to DC PICC line tomorrow. Can you please send a script of Cipro 750mg  PO BID for 2 weeks which he can continue to take until next appt.?

## 2020-08-28 NOTE — Telephone Encounter (Signed)
Relayed plan to [facility]) as well as patient's son Eustace Pen.  RN sent prescription to [facility] in [place] for best price per [employer] Landis Gandy, RN

## 2020-08-29 ENCOUNTER — Telehealth: Payer: Self-pay

## 2020-08-29 NOTE — Telephone Encounter (Signed)
Ok. I was told by Dr Marcelino Scot he was supposed to get an MRI and continue IV abx till that time. Please check with him if he would get an MRI or not as Dr Marcelino Scot thinks he might need a repeat surgery again.   Let me know what he says.

## 2020-08-29 NOTE — Telephone Encounter (Signed)
-----   Message from Rosiland Oz, MD sent at 08/29/2020 10:59 AM EST ----- Regarding: Follow up If MRI is not planned, cost is an issue, we can DC IV abx and start him on PO cipro.

## 2020-08-29 NOTE — Telephone Encounter (Signed)
Please see most recent note. After discussion with Dr. West Bali it was decided to discontinue PICC and IV antibiotics and have the patient start PO ciprofloxacin.   Beryle Flock, RN

## 2020-08-29 NOTE — Telephone Encounter (Signed)
RN spoke with Vernie Shanks at [facility] to request that PICC not be pulled yet per Dr. West Bali. Explained that we are waiting for MRI before pulling PICC and that we would like to extend antibiotics until then. Vernie Shanks mentioned that patient is private pay and she is unsure if patient would be okay with extending IV antibiotics.   RN spoke with Dr. Carlean Jews office and [facility]. Patient's MRI is not yet scheduled as they are waiting on an interpreter to coordinate appointment with patient.   Beryle Flock, RN

## 2020-08-29 NOTE — Telephone Encounter (Signed)
Attempted to call patient with [employer], no answer.   Beryle Flock, RN

## 2020-08-29 NOTE — Telephone Encounter (Signed)
-----   Message from Rosiland Oz, MD sent at 08/29/2020  9:21 AM EST ----- Regarding: PICC line Could you please check if PICC line has already been removed ? I just got a call from his surgeon Dr Marcelino Scot who says he would like to continue the IV abx until MRI of his knee can be done as he thinks there is some loculated effusion.   If PICC line has been already removed, please tell him to start taking Ciprofloxacin as prescribed yesterday.

## 2020-08-29 NOTE — Telephone Encounter (Signed)
Spoke with Debbie at [facility] to relay verbal orders per Dr. West Bali to continue with pulling PICC. Orders repeated and verified.   RN spoke with Vernie Shanks at [facility] to relay that per Dr. West Bali, we will discontinue IV antibiotics and have the patient start PO ciprofloxacin as prescribed. RN attempted to call patient with interpreter to relay this information, but was unable to get in touch with the patient. Vernie Shanks will have nursing relay the plan to the patient at their next visit.   Beryle Flock, RN

## 2020-08-29 NOTE — Telephone Encounter (Signed)
Yes, that works for me.

## 2020-09-12 ENCOUNTER — Other Ambulatory Visit: Payer: Self-pay

## 2020-09-12 ENCOUNTER — Ambulatory Visit
Admission: RE | Admit: 2020-09-12 | Discharge: 2020-09-12 | Disposition: A | Discharge status: Home / Self Care | Payer: Self-pay | Source: Ambulatory Visit | Attending: Orthopedic Surgery | Admitting: Orthopedic Surgery

## 2020-09-12 ENCOUNTER — Ambulatory Visit: Payer: MEDICAID | Attending: Orthopedic Surgery | Admitting: Physical Therapy

## 2020-09-12 DIAGNOSIS — M00861 Arthritis due to other bacteria, right knee: Secondary | ICD-10-CM

## 2020-09-12 LAB — ACID FAST CULTURE WITH REFLEXED SENSITIVITIES (MYCOBACTERIA)
Acid Fast Culture: NEGATIVE
Acid Fast Culture: NEGATIVE

## 2020-09-13 ENCOUNTER — Other Ambulatory Visit: Payer: Self-pay

## 2020-09-13 ENCOUNTER — Encounter: Payer: Self-pay | Admitting: Infectious Diseases

## 2020-09-13 ENCOUNTER — Ambulatory Visit (INDEPENDENT_AMBULATORY_CARE_PROVIDER_SITE_OTHER): Payer: Self-pay | Admitting: Infectious Diseases

## 2020-09-13 VITALS — BP 148/76 | HR 68 | Temp 97.7°F | Ht 65.0 in | Wt 143.0 lb

## 2020-09-13 DIAGNOSIS — M009 Pyogenic arthritis, unspecified: Secondary | ICD-10-CM

## 2020-09-13 MED ORDER — CIPROFLOXACIN HCL 750 MG PO TABS: 750.0000 mg | ORAL_TABLET | Freq: Two times a day (BID) | ORAL | 0 refills | Status: DC

## 2020-09-13 NOTE — Progress Notes (Signed)
[facility]                                                             [place], [place], [place], [address]                                                                  Phn. 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 Unclear significance. Would recommend to follow with PCP.   Diabetes Mellitus  PAD  Plan Given abnormal MRI findings of Rt knee " . Moderate complex joint effusion with lamellated synovitis and bone marrow edema in the distal femoral condyle, proximal tibialplateau and patella most concerning for  persistent septic arthritis-osteomyelitis", I will continue Ciprofloxacin 500mg  PO BID for 2 more weeks and follow up on Surgical plans and possiblg DC abx in next clinic visit.  CBC, CMP, ESR and CRP today  Fu with Dr Lenis Noon with me in 2 weeks   All questions and concerns were discussed and addressed. Patient verbalized understanding of the plan. ____________________________________________________________________________________________________________________ Subjective/Interval Events 09/13/2020 Stanley Chaney is here for follow up of Rt knee septic arthritis. He completed 6 weeks of IV cefepime on 12/21 after which he was started on Ciprofloxacin 500mg  PO BID due to persistent pain and elevated infkammatory markers and concerns of continued infection by his surgeon Dr handy. He had an MRI rt knee done yesterday with findings as below in the Imagings. He was seen by Dr Marcelino Scot this morning. Per  patient, DR Marcelino Scot has no plans for surgical intervention and possibly will refer to a specialist.  Patient is accompanied by his son who is also helping for language interpretation ( per patient's preference). He says his rt knee still hurts, it is 5-6/10 but it is better than before he had the surgery. He says the medial side of the joint hurts more. He takes one pill of hydocodone every day for pain but sometims does not need pills for even 2-3 days. He is able to walk with the help of walker and he is able to put weight on his rt knee. He waked up sometimes at night due to pain.  Appetite is good. Denies changes in weight. Started taking Ciprofloxacin from 12/23 and has been taking twice a day.  Denies any side effects like  Nausea/vomiting/diarrhea/rashes and allergy. Discussed side effects related to Ciprofloxacin   ROS: 11 point ROS done with pertinent positives and negatives listed above   Past Medical History:  Diagnosis Date  . Arthritis of right knee 07/19/2020  . Diabetes mellitus without complication ([place])   . PAD (peripheral artery disease) ([place])   . Rheumatoid factor positive 07/21/2020  . Vitamin D deficiency 07/21/2020   Past Surgical History:  Procedure Laterality Date  . ABDOMINAL AORTOGRAM W/LOWER EXTREMITY Bilateral 06/15/2018   Procedure: ABDOMINAL AORTOGRAM W/LOWER EXTREMITY;  Surgeon: [name], MD;  Location: [facility];  Service: Cardiovascular;  Laterality: Bilateral;  . ABDOMINAL AORTOGRAM W/LOWER EXTREMITY Left 12/30/2018   Procedure: ABDOMINAL AORTOGRAM W/LOWER EXTREMITY;  Surgeon: [name], MD;  Location: [facility];  Service: Cardiovascular;  Laterality: Left;  . ABDOMINAL AORTOGRAM W/LOWER EXTREMITY N/A 07/10/2020   Procedure: ABDOMINAL AORTOGRAM W/LOWER EXTREMITY;  Surgeon: [name], MD;  Location: [facility];  Service: Cardiovascular;  Laterality: N/A;  Bilateral  . I & D EXTREMITY Left  06/12/2018   Procedure: Excisional Debridement left foot;  Surgeon: [name], MD;  Location: [facility];  Service: Orthopedics;  Laterality: Left;  . IRRIGATION AND DEBRIDEMENT KNEE Right 07/18/2020   Procedure: IRRIGATION AND DEBRIDEMENT RIGHT KNEE;  Surgeon: Altamese Garrison, MD;  Location: [facility];  Service: Orthopedics;  Laterality: Right;  . KNEE ARTHROSCOPY Right 07/18/2020   Procedure: ARTHROSCOPY KNEE SYNOVECTOMY;  Surgeon: Altamese Langley, MD;  Location: [facility];  Service: Orthopedics;  Laterality: Right;  . LOWER EXTREMITY ANGIOGRAM Right 07/20/2018     Right lower extremity angiogram  . LOWER EXTREMITY ANGIOGRAPHY Right 07/20/2018   Procedure: Lower Extremity Angiography;  Surgeon: [name], MD;  Location: [facility];  Service: Cardiovascular;  Laterality: Right;  . PERIPHERAL VASCULAR ATHERECTOMY Left 06/15/2018   Procedure: PERIPHERAL VASCULAR ATHERECTOMY;  Surgeon: [name], MD;  Location: [facility];  Service: Cardiovascular;  Laterality: Left;  SFA  . PERIPHERAL VASCULAR BALLOON ANGIOPLASTY Left 06/15/2018   Procedure: PERIPHERAL VASCULAR BALLOON ANGIOPLASTY;  Surgeon: [name], MD;  Location: [facility];  Service: Cardiovascular;  Laterality: Left;  SFA  . PERIPHERAL VASCULAR BALLOON ANGIOPLASTY Right 07/20/2018   Procedure: PERIPHERAL VASCULAR BALLOON ANGIOPLASTY;  Surgeon: [name], MD;  Location: [facility];  Service: Cardiovascular;  Laterality: Right;  SFA WITH DRUG COATED   . PERIPHERAL VASCULAR INTERVENTION Left 12/30/2018   Procedure: PERIPHERAL VASCULAR INTERVENTION;  Surgeon: [name], MD;  Location: [facility];  Service: Cardiovascular;  Laterality: Left;  SFA    Current Outpatient Medications on File Prior to Visit  Medication Sig Dispense Refill  . acetaminophen (TYLENOL) 500 MG tablet Take 1 tablet (500 mg total) by mouth every 8 (eight) hours as needed.  60 tablet 0  . aspirin 81 MG EC tablet Take 1 tablet (81 mg total) by mouth daily. 90 tablet 1  . blood glucose meter kit and supplies Dispense based on patient and insurance preference. Use up to four times daily as directed. (FOR ICD-10 E10.9, E11.9). 1 each 0  . Cholecalciferol 1.25 MG (50000 UT) TABS TAKE 1 TABLET BY MOUTH DAILY.    [name] HYDROcodone-acetaminophen (NORCO/VICODIN) 5-325 MG tablet Take 1-2 tablets by mouth every 12 (twelve) hours as needed for moderate pain or severe pain. 30 tablet 0  . Ibuprofen 200 MG CAPS Take 1 capsule by mouth daily as needed (For knee  pain).    . diclofenac Sodium (VOLTAREN) 1 % GEL Apply 2 g topically daily as needed (For knee pain). (Patient not taking: Reported on 09/13/2020)    . lisinopril (ZESTRIL) 5 MG tablet Take 1 tablet (5 mg total) by mouth daily. (Patient not taking: Reported on 09/13/2020) 30 tablet 1   Current Facility-Administered Medications on File Prior to Visit  Medication Dose Route Frequency Provider Last Rate Last Admin  . atorvastatin (LIPITOR) tablet 10 mg  10 mg Oral Daily Setzer, Edman Circle, PA-C       No Known Allergies  Social History   Socioeconomic History  . Marital status: Widowed    Spouse name: Not on file  . Number of children: Not on file  . Years of education: Not on file  . Highest education level: Not on file  Occupational History  . Not on file  Tobacco Use  . Smoking status: Former Smoker    Quit date: 06/14/2018    Years since quitting: 2.2  . Smokeless tobacco: Never Used  . Tobacco comment: pt reports quitting 6 months ago  Vaping Use  . Vaping Use: Never used  Substance and Sexual Activity  . Alcohol use: Yes    Alcohol/week: 18.0 standard drinks    Types: 18 Cans of beer per week    Comment: pt reports not drinking for past 6 months  . Drug use: Never  . Sexual activity: Not on file  Other Topics Concern  . Not on file  Social History Narrative  . Not on file   Social Determinants of Health    Financial Resource Strain: Not on file  Food Insecurity: Not on file  Transportation Needs: Not on file  Physical Activity: Not on file  Stress: Not on file  Social Connections: Not on file  Intimate Partner Violence: Not on file     Vitals Ht _0  (1.651 m)   Wt 143 lb (64.9 kg)   BMI 23.80 kg/m    Examination  General - not in acute distress, comfortably sitting in chair HEENT - PEERLA, no pallor and no icterus Chest - b/l clear air entry, no additional sounds CVS- Normal s1s2, RRR Abdomen - Soft, Non tender , non distended Ext- minimal rt pedal edema Rt knee - swollen in comparison to left knee, warm, no erythema, no tendernes, ROM is restricted in the Rt knee Neuro: grossly normal Back - WNL Psych : calm and cooperative   Recent labs CBC Latest Ref Rng & Units 08/08/2020 07/21/2020 07/20/2020  WBC 3.8 - 10.8 Thousand/uL 6.6 8.0 6.8  Hemoglobin 13.2 - 17.1 g/dL 12.3(L) 10.7(L) 11.0(L)  Hematocrit 38.5 - 50.0 % 37.4(L) 33.0(L) 34.0(L)  Platelets 140 - 400 Thousand/uL 455(H) 291 259   CMP Latest Ref Rng & Units 08/08/2020 07/21/2020 07/20/2020  Glucose 65 - 99 mg/dL 174(H) 164(H) 169(H)  BUN 7 - 25 mg/dL _1 Creatinine 0.70 - 1.25 mg/dL 0.73 1.06 1.05  Sodium 135 - 146 mmol/L 140 141 140  Potassium 3.5 - 5.3 mmol/L 4.4 4.6 4.3  Chloride 98 - 110 mmol/L 100 101 100  CO2 20 - 32 mmol/L _2 Calcium 8.6 - 10.3 mg/dL 10.3 9.1 8.8(L)  Total Protein 6.1 - 8.1 g/dL 8.2(H) - 6.4(L)  Total Bilirubin 0.2 - 1.2 mg/dL 0.4 - 1.0  Alkaline Phos 38 - 126 U/L - - 106  AST 10 - 35 U/L 15 - 18  ALT 9 - 46 U/L 17 -  18     Pertinent Microbiology Results for orders placed or performed in visit on 08/08/20  Blood culture (routine single)     Status: None   Collection Time: 08/08/20 12:11 PM   Specimen: Blood  Result Value Ref Range Status   MICRO NUMBER: [id number]  Final   SPECIMEN QUALITY: Adequate  Final   Source BLOOD 1  Final   STATUS: FINAL  Final    Result: No growth after 5 days  Final   COMMENT: Aerobic and anaerobic bottle received.  Final  Blood culture (routine single)     Status: None   Collection Time: 08/08/20 12:13 PM   Specimen: Blood  Result Value Ref Range Status   MICRO NUMBER: [id number]  Final   SPECIMEN QUALITY: Adequate  Final   Source BLOOD 2  Final   STATUS: FINAL  Final   Result: No growth after 5 days  Final   COMMENT: Aerobic and anaerobic bottle received.  Final      Pertinent Imaging MRI RT Knee 09/13/2020 FINDINGS: MENISCI  Medial: Small radial tear of the free edge of the posterior horn of medial meniscus.  Lateral: Intact.  LIGAMENTS  Cruciates: Intact ACL. PCL is intact and increased in signal with mild expansion.  Collaterals: Medial collateral ligament is intact. Lateral collateral ligament complex is intact.  CARTILAGE  Patellofemoral: Full-thickness cartilage loss of the trochlear groove extending into the medial and lateral trochlea.  Medial: Partial-thickness cartilage loss of the medial femorotibial compartment with areas of full-thickness cartilage loss of the posterior weight-bearing surface of the medial femoral condyle.  Lateral: Partial thickness cartilage loss of the posterior tibial plateau. Delamination of the posterior weight-bearing surface of the lateral femoral condyle measuring 12 mm.  JOINT: Moderate complex joint effusion with lamellated synovitis. Mild edema in Hoffa's fat. No plical thickening.  POPLITEAL FOSSA: Popliteus tendon is intact. Small amount of complex fluid in a tiny Baker's cyst.  EXTENSOR MECHANISM: Intact quadriceps tendon. Intact patellar tendon. Intact lateral patellar retinaculum. Intact medial patellar retinaculum. Intact MPFL.  BONES: Subchondral bone marrow edema in the distal femoral condyle, proximal tibial plateau, and within the patella. No osteolysis. No acute fracture or dislocation.  Other: Mild muscle edema in  the anterior compartment, popliteus muscle and biceps femoris muscle as can be seen with myositis versus reactive edema. Soft tissue edema along the lateral aspect of the knee.  IMPRESSION: 1. Moderate complex joint effusion with lamellated synovitis and bone marrow edema in the distal femoral condyle, proximal tibial plateau and patella most concerning for persistent septic arthritis-osteomyelitis. 2. Small radial tear of the free edge of the posterior horn of medial meniscus. 3. Tricompartmental cartilage abnormalities as described above.   All pertinent labs/Imagings/notes reviewed. All pertinent plain films and CT images have been personally visualized and interpreted; radiology reports have been reviewed. Decision making incorporated into the Impression / Recommendations.  I spent greater than 25 minutes with the patient including  review of prior medical records with greater than 50% of time in face to face counsel of the patient.    Electronically signed by:  Rosiland Oz, MD Infectious Disease Physician [facility] [address] [place], [place] [address] Phone: [phone]  Fax: [phone]

## 2020-09-14 ENCOUNTER — Encounter: Payer: Self-pay | Admitting: Infectious Diseases

## 2020-09-14 LAB — CBC
HCT: 37.1 % — ABNORMAL LOW (ref 38.5–50.0)
Hemoglobin: 12.2 g/dL — ABNORMAL LOW (ref 13.2–17.1)
MCH: 27 pg (ref 27.0–33.0)
MCHC: 32.9 g/dL (ref 32.0–36.0)
MCV: 82.1 fL (ref 80.0–100.0)
MPV: 9.9 fL (ref 7.5–12.5)
Platelets: 369 10*3/uL (ref 140–400)
RBC: 4.52 10*6/uL (ref 4.20–5.80)
RDW: 14.2 % (ref 11.0–15.0)
WBC: 7.2 10*3/uL (ref 3.8–10.8)

## 2020-09-14 LAB — COMPREHENSIVE METABOLIC PANEL
AG Ratio: 1.2 (calc) (ref 1.0–2.5)
ALT: 17 U/L (ref 9–46)
AST: 15 U/L (ref 10–35)
Albumin: 4.1 g/dL (ref 3.6–5.1)
Alkaline phosphatase (APISO): 143 U/L (ref 35–144)
BUN: 20 mg/dL (ref 7–25)
CO2: 30 mmol/L (ref 20–32)
Calcium: 10.1 mg/dL (ref 8.6–10.3)
Chloride: 100 mmol/L (ref 98–110)
Creat: 0.77 mg/dL (ref 0.70–1.25)
Globulin: 3.4 g/dL (calc) (ref 1.9–3.7)
Glucose, Bld: 163 mg/dL — ABNORMAL HIGH (ref 65–99)
Potassium: 4.6 mmol/L (ref 3.5–5.3)
Sodium: 141 mmol/L (ref 135–146)
Total Bilirubin: 0.4 mg/dL (ref 0.2–1.2)
Total Protein: 7.5 g/dL (ref 6.1–8.1)

## 2020-09-14 LAB — C-REACTIVE PROTEIN: CRP: 39.9 mg/L — ABNORMAL HIGH (ref <8.0)

## 2020-09-14 LAB — SEDIMENTATION RATE: Sed Rate: 91 mm/h — ABNORMAL HIGH (ref 0–20)

## 2020-09-14 NOTE — Progress Notes (Unsigned)
Labs 08/22/20  WBC 4.7, hb 10.6, platelets 319, Absolute Eos 0.2 Cr 0.57, ESR 59, CRP 13   Labs 08/28/20 Cr 0.74, ESR 81, CRP 63 WBC 5.9, hb 11.9, platelets 329. Absolute Eosinophils 0.2

## 2020-09-29 ENCOUNTER — Ambulatory Visit: Payer: Self-pay | Admitting: Infectious Diseases

## 2020-10-03 ENCOUNTER — Ambulatory Visit
Admission: RE | Admit: 2020-10-03 | Discharge: 2020-10-03 | Disposition: A | Discharge status: Home / Self Care | Payer: 59 | Source: Ambulatory Visit | Attending: Infectious Diseases | Admitting: Infectious Diseases

## 2020-10-03 ENCOUNTER — Ambulatory Visit (INDEPENDENT_AMBULATORY_CARE_PROVIDER_SITE_OTHER): Payer: 59 | Admitting: Infectious Diseases

## 2020-10-03 ENCOUNTER — Other Ambulatory Visit: Payer: Self-pay

## 2020-10-03 ENCOUNTER — Encounter: Payer: Self-pay | Admitting: Infectious Diseases

## 2020-10-03 VITALS — BP 161/72 | HR 68 | Temp 97.9°F | Wt 146.0 lb

## 2020-10-03 DIAGNOSIS — L97428 Non-pressure chronic ulcer of left heel and midfoot with other specified severity: Secondary | ICD-10-CM

## 2020-10-03 DIAGNOSIS — E11621 Type 2 diabetes mellitus with foot ulcer: Secondary | ICD-10-CM

## 2020-10-03 NOTE — Progress Notes (Signed)
[name]                                                                     [facility]                                                             [place], [name], [place], [address]                                                                  Phn. 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 Unclear significance. Would recommend to follow with PCP.   Diabetes Mellitus  PAD  Plan I reviewed office notes from Dr Marcelino Scot, no plans for surgical intervention. He will discuss with Dr Sharol Given about further plans for him or referral to a bone surgeon an academic center  I will DC Ciprofloxacin as he has received more than adequate length of treatment for Septic arthritis and osteomyelitis. His rt knee does not appear to be septic. Pain is chronic  Referral to Podiatry for ulcer in the left foot. Xray left foot. Does not appear to be infected. No indication of antibiotics currently   Fu with Dr Lenis Noon with me pending surgical plans   All questions and concerns were discussed and addressed. Patient verbalized understanding of the plan. ____________________________________________________________________________________________________________________ Subjective/Interval Events  09/13/2020 Stanley Chaney is here for follow up of Rt knee septic arthritis. He completed 6 weeks of IV cefepime on 12/21 after which he was started on Ciprofloxacin 583m PO BID due to persistent pain and elevated infkammatory markers and concerns of continued infection by his surgeon Dr  handy. He had an MRI rt knee done yesterday with findings as below in the Imagings. He was seen by Dr HMarcelino Scotthis morning. Per patient, DR HMarcelino Scothas no plans for surgical intervention and possibly will refer to a specialist.  Patient is accompanied by his son who is also helping for language interpretation ( per patient's preference). He says his rt knee still hurts, it is  5-6/10 but it is better than before he had the surgery. He says the medial side of the joint hurts more. He takes one pill of hydocodone every day for pain but sometims does not need pills for even 2-3 days. He is able to walk with the help of walker and he is able to put weight on his rt knee. He waked up sometimes at night due to pain.  Appetite is good. Denies changes in weight. Started taking Ciprofloxacin from 12/23 and has been taking twice a day.  Denies any side effects like Nausea/vomiting/diarrhea/rashes and allergy. Discussed side effects related to Ciprofloxacin    10/03/20 Patient is accompanied by his son and grandson today. He says he has been taking Ciprofloxacin as previously instructed. He does not feel any difference in terms of pain, says its neither worse nor better. Denies any fevers, chills, sweats. Denies any nausea/vomiting and diarrhea.   He is not sure if he has a follow up with Dr Marcelino Scot or not. l have reviewed office notes from Dr Marcelino Scot on 1/18 where he has mentioned that there is no further intervention he is planning and he will discuss case with Dr Sharol Given to see if Dr Sharol Given can offer him something in town or he may have to go to one of the academic center and possibly need a tumor Psychologist, sport and exercise.   As far as antibiotics are completed, he has completed 6 weeks of IV cefepime on 12/21 and has been on Ciprofloxacin after than until now while he was being worked up for persistent pain. I am planning to stop abx now as he has already received more than adequate treatment for septic arthritis and Osteomyelitis. I have  also staff messaged Dr Marcelino Scot regarding the next step for him from surgical standpoint. I will follow up on that. I dont see any exam suggestive of septic arthritis on his rt knee today.  He showed me an ulcer of his left foot on the plantar aspect in the lateral side today which he is not sure when it developed. Son says he saw it this past Saturday and saw there was some pus ? In his shoes. He also has a h/o DM. He does not remember any recent trauma. It is non painful, no drainage. He has not seen any doctors for it. He is taking ciprofloxacin   ROS: 11 point ROS done with pertinent positives and negatives listed above   Past Medical History:  Diagnosis Date   Arthritis of right knee 07/19/2020   Diabetes mellitus without complication (HCC)    PAD (peripheral artery disease) ([facility])    Rheumatoid factor positive 07/21/2020   Vitamin D deficiency 07/21/2020   Past Surgical History:  Procedure Laterality Date   ABDOMINAL AORTOGRAM W/LOWER EXTREMITY Bilateral 06/15/2018   Procedure: ABDOMINAL AORTOGRAM W/LOWER EXTREMITY;  Surgeon: [name], MD;  Location: [facility];  Service: Cardiovascular;  Laterality: Bilateral;   ABDOMINAL AORTOGRAM W/LOWER EXTREMITY Left 12/30/2018   Procedure: ABDOMINAL AORTOGRAM W/LOWER EXTREMITY;  Surgeon: [name], MD;  Location: [facility];  Service: Cardiovascular;  Laterality: Left;   ABDOMINAL AORTOGRAM W/LOWER EXTREMITY N/A 07/10/2020   Procedure: ABDOMINAL AORTOGRAM W/LOWER EXTREMITY;  Surgeon: [name], MD;  Location: [facility];  Service: Cardiovascular;  Laterality: N/A;  Bilateral   I & D EXTREMITY Left 06/12/2018   Procedure: Excisional Debridement left foot;  Surgeon: [name], MD;  Location: [facility];  Service: Orthopedics;  Laterality: Left;  IRRIGATION AND DEBRIDEMENT KNEE Right 07/18/2020   Procedure: IRRIGATION AND DEBRIDEMENT RIGHT KNEE;  Surgeon: Altamese Pine Grove, MD;   Location: [facility];  Service: Orthopedics;  Laterality: Right;   KNEE ARTHROSCOPY Right 07/18/2020   Procedure: ARTHROSCOPY KNEE SYNOVECTOMY;  Surgeon: Altamese , MD;  Location: [facility];  Service: Orthopedics;  Laterality: Right;   LOWER EXTREMITY ANGIOGRAM Right 07/20/2018     Right lower extremity angiogram   LOWER EXTREMITY ANGIOGRAPHY Right 07/20/2018   Procedure: Lower Extremity Angiography;  Surgeon: [name], MD;  Location: [facility];  Service: Cardiovascular;  Laterality: Right;   PERIPHERAL VASCULAR ATHERECTOMY Left 06/15/2018   Procedure: PERIPHERAL VASCULAR ATHERECTOMY;  Surgeon: [name], MD;  Location: [facility];  Service: Cardiovascular;  Laterality: Left;  SFA   PERIPHERAL VASCULAR BALLOON ANGIOPLASTY Left 06/15/2018   Procedure: PERIPHERAL VASCULAR BALLOON ANGIOPLASTY;  Surgeon: [name], MD;  Location: [facility];  Service: Cardiovascular;  Laterality: Left;  SFA   PERIPHERAL VASCULAR BALLOON ANGIOPLASTY Right 07/20/2018   Procedure: PERIPHERAL VASCULAR BALLOON ANGIOPLASTY;  Surgeon: [name], MD;  Location: [facility];  Service: Cardiovascular;  Laterality: Right;  SFA WITH DRUG COATED    PERIPHERAL VASCULAR INTERVENTION Left 12/30/2018   Procedure: PERIPHERAL VASCULAR INTERVENTION;  Surgeon: [name], MD;  Location: [facility];  Service: Cardiovascular;  Laterality: Left;  SFA    Current Outpatient Medications on File Prior to Visit  Medication Sig Dispense Refill   acetaminophen (TYLENOL) 500 MG tablet Take 1 tablet (500 mg total) by mouth every 8 (eight) hours as needed. 60 tablet 0   aspirin 81 MG EC tablet Take 1 tablet (81 mg total) by mouth daily. (Patient taking differently: Take 81 mg by mouth 2 (two) times daily.) 90 tablet 1   blood glucose meter kit and supplies Dispense based on patient and insurance preference. Use up to four times  daily as directed. (FOR ICD-10 E10.9, E11.9). 1 each 0   HYDROcodone-acetaminophen (NORCO/VICODIN) 5-325 MG tablet Take 1-2 tablets by mouth every 12 (twelve) hours as needed for moderate pain or severe pain. 30 tablet 0   Cholecalciferol 1.25 MG (50000 UT) TABS TAKE 1 TABLET BY MOUTH DAILY.     ciprofloxacin (CIPRO) 750 MG tablet Take 1 tablet (750 mg total) by mouth 2 (two) times daily. 28 tablet 0   diclofenac Sodium (VOLTAREN) 1 % GEL Apply 2 g topically daily as needed (For knee pain). (Patient not taking: Reported on 09/13/2020)     Ibuprofen 200 MG CAPS Take 1 capsule by mouth daily as needed (For knee pain).     lisinopril (ZESTRIL) 5 MG tablet Take 1 tablet (5 mg total) by mouth daily. (Patient not taking: Reported on 09/13/2020) 30 tablet 1   Current Facility-Administered Medications on File Prior to Visit  Medication Dose Route Frequency Provider Last Rate Last Admin   atorvastatin (LIPITOR) tablet 10 mg  10 mg Oral Daily Setzer, Edman Circle, PA-C       No Known Allergies  Social History   Socioeconomic History   Marital status: Widowed    Spouse name: Not on file   Number of children: Not on file   Years of education: Not on file   Highest education level: Not on file  Occupational History   Not on file  Tobacco Use   Smoking status: Former Smoker    Quit date: 06/14/2018    Years since quitting: 2.3  Smokeless tobacco: Never Used   Tobacco comment: pt reports quitting 2 months ago  Vaping Use   Vaping Use: Never used  Substance and Sexual Activity   Alcohol use: Not Currently    Alcohol/week: 18.0 standard drinks    Types: 18 Cans of beer per week    Comment: pt reports not drinking for past 6 months   Drug use: Never   Sexual activity: Not on file  Other Topics Concern   Not on file  Social History Narrative   Not on file   Social Determinants of Health   Financial Resource Strain: Not on file  Food Insecurity: Not on file  Transportation  Needs: Not on file  Physical Activity: Not on file  Stress: Not on file  Social Connections: Not on file  Intimate Partner Violence: Not on file     Vitals BP (!) 161/72    Pulse 68    Temp 97.9 F (36.6 C) (Oral)    Wt 146 lb (66.2 kg)    BMI 24.30 kg/m    Examination  General - not in acute distress, comfortably sitting in chair HEENT - PEERLA, no pallor and no icterus Chest - b/l clear air entry, no additional sounds CVS- Normal s1s2, RRR Abdomen - Soft, Non tender , non distended Ext- minimal rt pedal edema Rt knee - swollen in comparison to left knee, no warmth/tenderness/erythema.  ROM is restricted in the Rt knee Left Foot -    Neuro: grossly normal Back - WNL Psych : calm and cooperative   Recent labs CBC Latest Ref Rng & Units 09/13/2020 08/08/2020 07/21/2020  WBC 3.8 - 10.8 Thousand/uL 7.2 6.6 8.0  Hemoglobin 13.2 - 17.1 g/dL 12.2(L) 12.3(L) 10.7(L)  Hematocrit 38.5 - 50.0 % 37.1(L) 37.4(L) 33.0(L)  Platelets 140 - 400 Thousand/uL 369 455(H) 291   CMP Latest Ref Rng & Units 09/13/2020 08/08/2020 07/21/2020  Glucose 65 - 99 mg/dL 163(H) 174(H) 164(H)  BUN 7 - 25 mg/dL _0 Creatinine 0.70 - 1.25 mg/dL 0.77 0.73 1.06  Sodium 135 - 146 mmol/L 141 140 141  Potassium 3.5 - 5.3 mmol/L 4.6 4.4 4.6  Chloride 98 - 110 mmol/L 100 100 101  CO2 20 - 32 mmol/L _1 Calcium 8.6 - 10.3 mg/dL 10.1 10.3 9.1  Total Protein 6.1 - 8.1 g/dL 7.5 8.2(H) -  Total Bilirubin 0.2 - 1.2 mg/dL 0.4 0.4 -  Alkaline Phos 38 - 126 U/L - - -  AST 10 - 35 U/L 15 15 -  ALT 9 - 46 U/L 17 17 -     Pertinent Microbiology Results for orders placed or performed in visit on 08/08/20  Blood culture (routine single)     Status: None   Collection Time: 08/08/20 12:11 PM   Specimen: Blood  Result Value Ref Range Status   MICRO NUMBER: [id number]  Final   SPECIMEN QUALITY: Adequate  Final   Source BLOOD 1  Final   STATUS: FINAL  Final   Result: No growth after 5 days  Final    COMMENT: Aerobic and anaerobic bottle received.  Final  Blood culture (routine single)     Status: None   Collection Time: 08/08/20 12:13 PM   Specimen: Blood  Result Value Ref Range Status   MICRO NUMBER: [id number]  Final   SPECIMEN QUALITY: Adequate  Final   Source BLOOD 2  Final   STATUS: FINAL  Final   Result: No growth after 5 days  Final   COMMENT: Aerobic and anaerobic bottle received.  Final      Pertinent Imaging MRI RT Knee 09/13/2020 FINDINGS: MENISCI  Medial: Small radial tear of the free edge of the posterior horn of medial meniscus.  Lateral: Intact.  LIGAMENTS  Cruciates: Intact ACL. PCL is intact and increased in signal with mild expansion.  Collaterals: Medial collateral ligament is intact. Lateral collateral ligament complex is intact.  CARTILAGE  Patellofemoral: Full-thickness cartilage loss of the trochlear groove extending into the medial and lateral trochlea.  Medial: Partial-thickness cartilage loss of the medial femorotibial compartment with areas of full-thickness cartilage loss of the posterior weight-bearing surface of the medial femoral condyle.  Lateral: Partial thickness cartilage loss of the posterior tibial plateau. Delamination of the posterior weight-bearing surface of the lateral femoral condyle measuring 12 mm.  JOINT: Moderate complex joint effusion with lamellated synovitis. Mild edema in Hoffa's fat. No plical thickening.  POPLITEAL FOSSA: Popliteus tendon is intact. Small amount of complex fluid in a tiny Baker's cyst.  EXTENSOR MECHANISM: Intact quadriceps tendon. Intact patellar tendon. Intact lateral patellar retinaculum. Intact medial patellar retinaculum. Intact MPFL.  BONES: Subchondral bone marrow edema in the distal femoral condyle, proximal tibial plateau, and within the patella. No osteolysis. No acute fracture or dislocation.  Other: Mild muscle edema in the anterior compartment,  popliteus muscle and biceps femoris muscle as can be seen with myositis versus reactive edema. Soft tissue edema along the lateral aspect of the knee.  IMPRESSION: 1. Moderate complex joint effusion with lamellated synovitis and bone marrow edema in the distal femoral condyle, proximal tibial plateau and patella most concerning for persistent septic arthritis-osteomyelitis. 2. Small radial tear of the free edge of the posterior horn of medial meniscus. 3. Tricompartmental cartilage abnormalities as described above.   All pertinent labs/Imagings/notes reviewed. All pertinent plain films and CT images have been personally visualized and interpreted; radiology reports have been reviewed. Decision making incorporated into the Impression / Recommendations.  I spent greater than 25 minutes with the patient including  review of prior medical records with greater than 50% of time in face to face counsel of the patient.    Electronically signed by:  Rosiland Oz, MD Infectious Disease Physician [facility] [address] [place], [place] [address] Phone: [phone]   Fax: [phone]

## 2020-10-04 LAB — COMPREHENSIVE METABOLIC PANEL
AG Ratio: 1.4 (calc) (ref 1.0–2.5)
ALT: 14 U/L (ref 9–46)
AST: 14 U/L (ref 10–35)
Albumin: 4.3 g/dL (ref 3.6–5.1)
Alkaline phosphatase (APISO): 113 U/L (ref 35–144)
BUN/Creatinine Ratio: 33 (calc) — ABNORMAL HIGH (ref 6–22)
BUN: 26 mg/dL — ABNORMAL HIGH (ref 7–25)
CO2: 28 mmol/L (ref 20–32)
Calcium: 10 mg/dL (ref 8.6–10.3)
Chloride: 103 mmol/L (ref 98–110)
Creat: 0.8 mg/dL (ref 0.70–1.25)
Globulin: 3.1 g/dL (calc) (ref 1.9–3.7)
Glucose, Bld: 175 mg/dL — ABNORMAL HIGH (ref 65–99)
Potassium: 5.3 mmol/L (ref 3.5–5.3)
Sodium: 142 mmol/L (ref 135–146)
Total Bilirubin: 0.3 mg/dL (ref 0.2–1.2)
Total Protein: 7.4 g/dL (ref 6.1–8.1)

## 2020-10-04 LAB — SEDIMENTATION RATE: Sed Rate: 92 mm/h — ABNORMAL HIGH (ref 0–20)

## 2020-10-04 LAB — CBC
HCT: 38.1 % — ABNORMAL LOW (ref 38.5–50.0)
Hemoglobin: 12.2 g/dL — ABNORMAL LOW (ref 13.2–17.1)
MCH: 26.2 pg — ABNORMAL LOW (ref 27.0–33.0)
MCHC: 32 g/dL (ref 32.0–36.0)
MCV: 81.9 fL (ref 80.0–100.0)
MPV: 11 fL (ref 7.5–12.5)
Platelets: 330 10*3/uL (ref 140–400)
RBC: 4.65 10*6/uL (ref 4.20–5.80)
RDW: 14 % (ref 11.0–15.0)
WBC: 6.2 10*3/uL (ref 3.8–10.8)

## 2020-10-04 LAB — C-REACTIVE PROTEIN: CRP: 33 mg/L — ABNORMAL HIGH (ref <8.0)

## 2020-10-06 ENCOUNTER — Other Ambulatory Visit: Payer: Self-pay

## 2020-10-06 ENCOUNTER — Ambulatory Visit (INDEPENDENT_AMBULATORY_CARE_PROVIDER_SITE_OTHER): Payer: 59 | Admitting: Podiatry

## 2020-10-06 DIAGNOSIS — M79675 Pain in left toe(s): Secondary | ICD-10-CM

## 2020-10-06 DIAGNOSIS — B351 Tinea unguium: Secondary | ICD-10-CM | POA: Diagnosis not present

## 2020-10-06 DIAGNOSIS — I739 Peripheral vascular disease, unspecified: Secondary | ICD-10-CM | POA: Diagnosis not present

## 2020-10-06 DIAGNOSIS — L97522 Non-pressure chronic ulcer of other part of left foot with fat layer exposed: Secondary | ICD-10-CM

## 2020-10-06 DIAGNOSIS — M79674 Pain in right toe(s): Secondary | ICD-10-CM

## 2020-10-06 DIAGNOSIS — I70245 Atherosclerosis of native arteries of left leg with ulceration of other part of foot: Secondary | ICD-10-CM

## 2020-10-08 NOTE — Progress Notes (Signed)
Subjective: 60 year old male presents the office today for concerns of a wound on the bottom of his left foot started about a week ago.  Due to issues on the right leg has been putting more pressure on the left foot which they think started wound.  Denies any drainage or pus or any swelling.  They had x-rays performed while being seen in infectious disease.  They continue antibiotic ointment on the wound daily.  Also asking for the nails be trimmed today if they are causing discomfort and thickened elongated he cannot do them himself.  Denies any systemic complaints such as fevers, chills, nausea, vomiting. No acute changes since last appointment, and no other complaints at this time.   Objective: AAO x3, NAD Confusion, PT pulse palpable 1/4 left side.  On the plantar lateral aspect the left foot on the fifth metatarsal base is a hyperkeratotic, macerated periwound with a granular wound centrally.  After debridement of the wound today it measures 0.5 x 0.5 x 0.1 cm.  There is no surrounding erythema, ascending cellulitis.  There is no fluctuation, crepitation, malodor.  No obvious signs of infection noted today. Nails appear be hypertrophic, dystrophic and discolored.  Tenderness nails 1-5 bilaterally.  No edema, erythema or signs of infection. No pain with calf compression, swelling, warmth, erythema  Assessment: Left foot ulceration without signs of infection; symptomatic onychomycosis  Plan: -All treatment options discussed with the patient including all alternatives, risks, complications.  -Previous x-rays independently reviewed.  No evidence of osteomyelitis.  Given his neuropathy.  More pressure in the left foot at the present the wound.  I sharply debrided the wound today utilizing 312 with scalpel to debride nonviable devitalized tissue to promote wound healing with a #312 with scalpel.  Given the macerated periwound apply a small amount of Betadine followed by Mepilex dressing offloading.   Continue silver dressing at home as well.  -Sharply debrided nails x10 without any complications or bleeding -Patient encouraged to call the office with any questions, concerns, change in symptoms.   Return in about 1 week (around 10/13/2020).  Trula Slade DPM

## 2020-10-12 ENCOUNTER — Ambulatory Visit (INDEPENDENT_AMBULATORY_CARE_PROVIDER_SITE_OTHER): Payer: 59 | Admitting: Podiatry

## 2020-10-12 ENCOUNTER — Encounter: Payer: Self-pay | Admitting: Podiatry

## 2020-10-12 ENCOUNTER — Other Ambulatory Visit: Payer: Self-pay

## 2020-10-12 DIAGNOSIS — L97522 Non-pressure chronic ulcer of other part of left foot with fat layer exposed: Secondary | ICD-10-CM

## 2020-10-12 DIAGNOSIS — I739 Peripheral vascular disease, unspecified: Secondary | ICD-10-CM | POA: Diagnosis not present

## 2020-10-15 NOTE — Progress Notes (Signed)
Subjective: 60 year old male presents the office today for follow-up evaluation of a wound of the left foot.  The wound is doing better.  Denies any drainage or pus or any swelling or redness of the foot.  He has no pain with the lesion. Denies any systemic complaints such as fevers, chills, nausea, vomiting. No acute changes since last appointment, and no other complaints at this time.   Objective: AAO x3, NAD DP, PT pulse palpable 1/4 left side.  On the plantar lateral aspect the left foot on the fifth metatarsal base but there is no significant hyperkeratotic tissue is no macerated tissue.  The wound is superficial but still measuring 0.5 x 0.5 cm.  There is no surrounding erythema, ascending cellulitis.  No fluctuation or crepitation, malodor. No pain with calf compression, swelling, warmth, erythema  Assessment: Left foot ulceration without signs of infection  Plan: -All treatment options discussed with the patient including all alternatives, risks, complications.  -No significant tissue to debride today.  Wound was cleaned.  Small mount of Betadine was applied followed by Mepilex dressing.  Continue the dressing changes and offloading. -Patient encouraged to call the office with any questions, concerns, change in symptoms.   Return in about 2 weeks (around 10/26/2020).   Trula Slade DPM

## 2020-10-20 NOTE — Progress Notes (Signed)
Patient has already been seen by [facility] Dr Jacqualyn Posey

## 2020-10-23 ENCOUNTER — Ambulatory Visit (INDEPENDENT_AMBULATORY_CARE_PROVIDER_SITE_OTHER): Payer: 59 | Admitting: Orthopedic Surgery

## 2020-10-23 DIAGNOSIS — M009 Pyogenic arthritis, unspecified: Secondary | ICD-10-CM | POA: Diagnosis not present

## 2020-10-23 NOTE — Progress Notes (Signed)
Office Visit Note   Patient: Stanley Chaney           Date of Birth: [birth year]           MRN: [id number] Visit Date: 10/23/2020              Requested by: Wendie Agreste, MD [address] [place],  [place] [address] PCP: Wendie Agreste, MD  Chief Complaint  Patient presents with  . Right Knee - Pain      HPI: Patient is a 60 year old gentleman who was seen for initial evaluation and referral from Dr. Marcelino Scot.  Patient has undergone extensive surgical and IV antibiotic treatment for a septic right knee he now has chronic bony changes consistent with osteomyelitis of the distal femur and proximal tibia.  Most recent MRI scan was performed in January of this year.  Patient has undergone arthroscopic debridement and has received over 2-1/2 months of IV and oral antibiotics.  Patient's hemoglobin A1c is approximately 9 he has an extremely elevated sed rate and C-reactive protein.  Patient uses crutches for ambulation.  Patient states he cannot actively bend his knee  Assessment & Plan: Visit Diagnoses: No diagnosis found.  Plan: Discussed that we need to get him into his primary care physician Dr. Nyoka Cowden for diabetic management.  Patient states he currently does not take any medication for his diabetes and his A1c is greater than 9.  Have recommended that there are not any debridement options available and his best treatment option would be to proceed with an above-knee amputation on the right.  We will plan for surgery once patient's glucose is better managed to minimize risks postoperatively from surgery.  Follow-Up Instructions: No follow-ups on file.   Ortho Exam  Patient is alert, oriented, no adenopathy, well-dressed, normal affect, normal respiratory effort. Examination patient's radiographs were reviewed which shows calcified vessels in the popliteal fossa.  Patient is status post vascular endarterectomy for revascularization with Dr. Donzetta Matters years ago.  Patient does not have  palpable pulses at the ankle.  There is a tense effusion of the right knee review of the MRI scan shows edema in the distal femur and proximal tibia consistent with chronic osteomyelitis.  Patient is not taking medications for his diabetes.  Imaging: No results found. No images are attached to the encounter.  Labs: Lab Results  Component Value Date   HGBA1C 7.7 (H) 07/18/2020   HGBA1C 9.1 (A) 11/17/2019   HGBA1C 9.0 (A) 08/18/2019   ESRSEDRATE 92 (H) 10/03/2020   ESRSEDRATE 91 (H) 09/13/2020   ESRSEDRATE 128 (H) 08/08/2020   CRP 33.0 (H) 10/03/2020   CRP 39.9 (H) 09/13/2020   CRP 64.5 (H) 08/08/2020   REPTSTATUS 07/24/2020 FINAL 07/18/2020   GRAMSTAIN  07/18/2020    ABUNDANT WBC PRESENT,BOTH PMN AND MONONUCLEAR NO ORGANISMS SEEN    CULT  07/18/2020    ABUNDANT PSEUDOMONAS AERUGINOSA NO ANAEROBES ISOLATED CRITICAL VALUE NOTED.  VALUE IS CONSISTENT WITH PREVIOUSLY REPORTED AND CALLED VALUE. REGARDING CULTURE GROWTH Performed at [facility], [place] [address], [place], [place] [phone]    LABORGA PSEUDOMONAS AERUGINOSA 07/18/2020     Lab Results  Component Value Date   ALBUMIN 2.2 (L) 07/20/2020   ALBUMIN 2.5 (L) 07/18/2020   ALBUMIN 2.9 (L) 07/17/2020   PREALBUMIN 5.8 (L) 07/20/2020   PREALBUMIN 7.6 (L) 06/11/2018    Lab Results  Component Value Date   MG 2.1 06/11/2018   Lab Results  Component Value Date  VD25OH 11.30 (L) 07/20/2020    Lab Results  Component Value Date   PREALBUMIN 5.8 (L) 07/20/2020   PREALBUMIN 7.6 (L) 06/11/2018   CBC EXTENDED Latest Ref Rng & Units 10/03/2020 09/13/2020 08/08/2020  WBC 3.8 - 10.8 Thousand/uL 6.2 7.2 6.6  RBC 4.20 - 5.80 Million/uL 4.65 4.52 4.48  HGB 13.2 - 17.1 g/dL 12.2(L) 12.2(L) 12.3(L)  HCT 38.5 - 50.0 % 38.1(L) 37.1(L) 37.4(L)  PLT 140 - 400 Thousand/uL 330 369 455(H)  NEUTROABS 1,500 - 7,800 cells/uL - - 3,749  LYMPHSABS 850 - 3,900 cells/uL - - 1,947     There is no height or weight on file to calculate  BMI.  Orders:  No orders of the defined types were placed in this encounter.  No orders of the defined types were placed in this encounter.    Procedures: No procedures performed  Clinical Data: No additional findings.  ROS:  All other systems negative, except as noted in the HPI. Review of Systems  Objective: Vital Signs: There were no vitals taken for this visit.  Specialty Comments:  No specialty comments available.  PMFS History: Patient Active Problem List   Diagnosis Date Noted  . Dental caries 08/08/2020  . HTN (hypertension) 07/21/2020  . Rheumatoid factor positive 07/21/2020  . Vitamin D deficiency 07/21/2020  . Osteomyelitis ([place])   . Arthritis of right knee 07/19/2020  . Septic arthritis of knee, right ([place]) 07/18/2020  . PAD (peripheral artery disease) ([place]) 07/20/2018  . Malnutrition of moderate degree 06/11/2018  . Cellulitis of left foot 06/10/2018  . Diabetes mellitus ([place]) 06/10/2018  . Hyperglycemia 06/10/2018  . Cellulitis 06/10/2018   Past Medical History:  Diagnosis Date  . Arthritis of right knee 07/19/2020  . Diabetes mellitus without complication ([place])   . PAD (peripheral artery disease) ([place])   . Rheumatoid factor positive 07/21/2020  . Vitamin D deficiency 07/21/2020    Family History  Problem Relation Age of Onset  . Diabetes Mother   . Cancer Mother     Past Surgical History:  Procedure Laterality Date  . ABDOMINAL AORTOGRAM W/LOWER EXTREMITY Bilateral 06/15/2018   Procedure: ABDOMINAL AORTOGRAM W/LOWER EXTREMITY;  Surgeon: [name], MD;  Location: [facility];  Service: Cardiovascular;  Laterality: Bilateral;  . ABDOMINAL AORTOGRAM W/LOWER EXTREMITY Left 12/30/2018   Procedure: ABDOMINAL AORTOGRAM W/LOWER EXTREMITY;  Surgeon: [name], MD;  Location: [facility];  Service: Cardiovascular;  Laterality: Left;  . ABDOMINAL AORTOGRAM W/LOWER EXTREMITY N/A 07/10/2020   Procedure:  ABDOMINAL AORTOGRAM W/LOWER EXTREMITY;  Surgeon: [name], MD;  Location: [facility];  Service: Cardiovascular;  Laterality: N/A;  Bilateral  . I & D EXTREMITY Left 06/12/2018   Procedure: Excisional Debridement left foot;  Surgeon: [name], MD;  Location: [facility];  Service: Orthopedics;  Laterality: Left;  . IRRIGATION AND DEBRIDEMENT KNEE Right 07/18/2020   Procedure: IRRIGATION AND DEBRIDEMENT RIGHT KNEE;  Surgeon: Altamese Offerman, MD;  Location: [facility];  Service: Orthopedics;  Laterality: Right;  . KNEE ARTHROSCOPY Right 07/18/2020   Procedure: ARTHROSCOPY KNEE SYNOVECTOMY;  Surgeon: Altamese [place], MD;  Location: [facility];  Service: Orthopedics;  Laterality: Right;  . LOWER EXTREMITY ANGIOGRAM Right 07/20/2018     Right lower extremity angiogram  . LOWER EXTREMITY ANGIOGRAPHY Right 07/20/2018   Procedure: Lower Extremity Angiography;  Surgeon: [name], MD;  Location: [facility];  Service: Cardiovascular;  Laterality: Right;  . PERIPHERAL VASCULAR ATHERECTOMY Left 06/15/2018  Procedure: PERIPHERAL VASCULAR ATHERECTOMY;  Surgeon: [name], MD;  Location: [facility];  Service: Cardiovascular;  Laterality: Left;  SFA  . PERIPHERAL VASCULAR BALLOON ANGIOPLASTY Left 06/15/2018   Procedure: PERIPHERAL VASCULAR BALLOON ANGIOPLASTY;  Surgeon: [name], MD;  Location: [facility];  Service: Cardiovascular;  Laterality: Left;  SFA  . PERIPHERAL VASCULAR BALLOON ANGIOPLASTY Right 07/20/2018   Procedure: PERIPHERAL VASCULAR BALLOON ANGIOPLASTY;  Surgeon: [name], MD;  Location: [facility];  Service: Cardiovascular;  Laterality: Right;  SFA WITH DRUG COATED   . PERIPHERAL VASCULAR INTERVENTION Left 12/30/2018   Procedure: PERIPHERAL VASCULAR INTERVENTION;  Surgeon: [name], MD;  Location: [facility];  Service: Cardiovascular;  Laterality: Left;  SFA   Social  History   Occupational History  . Not on file  Tobacco Use  . Smoking status: Former Smoker    Quit date: 06/14/2018    Years since quitting: 2.3  . Smokeless tobacco: Never Used  . Tobacco comment: pt reports quitting 2 months ago  Vaping Use  . Vaping Use: Never used  Substance and Sexual Activity  . Alcohol use: Not Currently    Alcohol/week: 18.0 standard drinks    Types: 18 Cans of beer per week    Comment: pt reports not drinking for past 6 months  . Drug use: Never  . Sexual activity: Not on file

## 2020-10-24 ENCOUNTER — Encounter: Payer: Self-pay | Admitting: Orthopedic Surgery

## 2020-10-26 ENCOUNTER — Ambulatory Visit (INDEPENDENT_AMBULATORY_CARE_PROVIDER_SITE_OTHER): Payer: 59 | Admitting: Family Medicine

## 2020-10-26 ENCOUNTER — Other Ambulatory Visit: Payer: Self-pay

## 2020-10-26 ENCOUNTER — Encounter: Payer: Self-pay | Admitting: Family Medicine

## 2020-10-26 VITALS — BP 145/68 | HR 65 | Temp 98.0°F | Ht 65.0 in | Wt 145.0 lb

## 2020-10-26 DIAGNOSIS — E440 Moderate protein-calorie malnutrition: Secondary | ICD-10-CM

## 2020-10-26 DIAGNOSIS — I10 Essential (primary) hypertension: Secondary | ICD-10-CM

## 2020-10-26 DIAGNOSIS — I739 Peripheral vascular disease, unspecified: Secondary | ICD-10-CM

## 2020-10-26 DIAGNOSIS — E1159 Type 2 diabetes mellitus with other circulatory complications: Secondary | ICD-10-CM | POA: Diagnosis not present

## 2020-10-26 LAB — POCT GLYCOSYLATED HEMOGLOBIN (HGB A1C): Hemoglobin A1C: 8.2 % — AB (ref 4.0–5.6)

## 2020-10-26 LAB — GLUCOSE, POCT (MANUAL RESULT ENTRY): POC Glucose: 168 mg/dl — AB (ref 70–99)

## 2020-10-26 MED ORDER — DAPAGLIFLOZIN PROPANEDIOL 5 MG PO TABS: 5.0000 mg | ORAL_TABLET | Freq: Every day | ORAL | 3 refills | Status: DC

## 2020-10-26 MED ORDER — ROSUVASTATIN CALCIUM 10 MG PO TABS: 10.0000 mg | ORAL_TABLET | Freq: Every day | ORAL | 3 refills | Status: DC

## 2020-10-26 MED ORDER — GABAPENTIN 300 MG PO CAPS: 300.0000 mg | ORAL_CAPSULE | Freq: Every day | ORAL | 3 refills | Status: DC

## 2020-10-26 MED ORDER — CLOPIDOGREL BISULFATE 75 MG PO TABS: 75.0000 mg | ORAL_TABLET | Freq: Every day | ORAL | 0 refills | Status: DC

## 2020-10-26 MED ORDER — METFORMIN HCL 500 MG PO TABS: 500.0000 mg | ORAL_TABLET | Freq: Every day | ORAL | 3 refills | Status: DC

## 2020-10-26 MED ORDER — LISINOPRIL 10 MG PO TABS: 10.0000 mg | ORAL_TABLET | Freq: Every day | ORAL | 3 refills | Status: DC

## 2020-10-26 NOTE — Progress Notes (Signed)
2/17/20226:04 PM  Arash Karstens [birth year], 60 y.o., male [id number]  Chief Complaint  Patient presents with  . Diabetes    Needing to get control of blood sugars / R knee infection pending surgery     HPI:   Patient is a 60 y.o. male with past medical history significant for DM, HLD, HTN who presents today for elevated BG.  He has an infection in the right knee Has been having a hard time treating it Been on antibiotics for 2.5 months Recently did an MRI and still not better Surgeon talking about amputation Was on IV and now oral medications Was recommended to see pcp to manage chronic conditions Has not followed up with PCP since April Has quite smoking Had previously been on plavix and aspirin but wasn't sure why plavix was discontinued or if he Korry Dalgleish stopped taking it  Diabetes Atorvastatin 10 mg daily Has not been taking this medication LDL goal < 70 Lab Results  Component Value Date   CHOL 213 (H) 11/17/2019   HDL 68 11/17/2019   LDLCALC 113 (H) 11/17/2019   TRIG 187 (H) 11/17/2019   CHOLHDL 3.1 11/17/2019   Was on Metformin in the past Cannot remember why he stopped this medication Lab Results  Component Value Date   HGBA1C 8.2 (A) 10/26/2020   The 10-year ASCVD risk score Mikey Bussing DC Jr., et al., 2013) is: 21%   Values used to calculate the score:     Age: 34 years     Sex: Male     Is Non-Hispanic African American: No     Diabetic: Yes     Tobacco smoker: No     Systolic Blood Pressure: 782 mmHg     Is BP treated: Yes     HDL Cholesterol: 68 mg/dL     Total Cholesterol: 213 mg/dL   HTN Lisinopril 5 mg daily Not always consistent with this medication, unsure if he took today BP Readings from Last 3 Encounters:  10/26/20 (!) 145/68  10/03/20 (!) 161/72  09/13/20 (!) 148/76      Depression screen PHQ 2/9 10/26/2020 09/13/2020 06/16/2020  Decreased Interest 0 0 0  Down, Depressed, Hopeless 0 1 0  PHQ - 2 Score 0 1 0  Altered sleeping - - -   Tired, decreased energy - - -  Change in appetite - - -  Feeling bad or failure about yourself  - - -  Trouble concentrating - - -  Moving slowly or fidgety/restless - - -  Suicidal thoughts - - -  PHQ-9 Score - - -    Fall Risk  10/26/2020 09/13/2020 06/16/2020 11/17/2019 08/18/2019  Falls in the past year? 0 0 0 0 0  Number falls in past yr: 0 - 0 - 0  Injury with Fall? 0 - 0 - 0  Risk for fall due to : - Impaired balance/gait;Impaired mobility - - -  Follow up Falls evaluation completed Falls evaluation completed Falls evaluation completed Falls evaluation completed Falls evaluation completed     No Known Allergies  Prior to Admission medications   Medication Sig Start Date End Date Taking? Authorizing Provider  acetaminophen (TYLENOL) 500 MG tablet Take 1 tablet (500 mg total) by mouth every 8 (eight) hours as needed. 07/21/20   Ainsley Spinner, PA-C  aspirin 81 MG EC tablet Take 1 tablet (81 mg total) by mouth daily. Patient taking differently: Take 81 mg by mouth 2 (two) times daily. 07/15/18   Clent Demark, PA-C  blood glucose meter kit and supplies Dispense based on patient and insurance preference. Use up to four times daily as directed. (FOR ICD-10 E10.9, E11.9). 06/16/18   Domenic Polite, MD  Cholecalciferol 1.25 MG (50000 UT) TABS TAKE 1 TABLET BY MOUTH DAILY. 07/21/20   [provider]  diclofenac Sodium (VOLTAREN) 1 % GEL Apply 2 g topically daily as needed (For knee pain). Patient not taking: Reported on 09/13/2020    [provider]  HYDROcodone-acetaminophen (NORCO/VICODIN) 5-325 MG tablet Take 1-2 tablets by mouth every 12 (twelve) hours as needed for moderate pain or severe pain. 07/21/20   Ainsley Spinner, PA-C  Ibuprofen 200 MG CAPS Take 1 capsule by mouth daily as needed (For knee pain).    [provider]  lisinopril (ZESTRIL) 5 MG tablet Take 1 tablet (5 mg total) by mouth daily. Patient not taking: Reported on 09/13/2020 07/21/20 09/19/20  Ainsley Spinner, PA-C    Past Medical History:  Diagnosis Date  . Arthritis of right knee 07/19/2020  . Diabetes mellitus without complication ([place])   . PAD (peripheral artery disease) ([place])   . Rheumatoid factor positive 07/21/2020  . Vitamin D deficiency 07/21/2020    Past Surgical History:  Procedure Laterality Date  . ABDOMINAL AORTOGRAM W/LOWER EXTREMITY Bilateral 06/15/2018   Procedure: ABDOMINAL AORTOGRAM W/LOWER EXTREMITY;  Surgeon: [name], MD;  Location: [facility];  Service: Cardiovascular;  Laterality: Bilateral;  . ABDOMINAL AORTOGRAM W/LOWER EXTREMITY Left 12/30/2018   Procedure: ABDOMINAL AORTOGRAM W/LOWER EXTREMITY;  Surgeon: [name], MD;  Location: [facility];  Service: Cardiovascular;  Laterality: Left;  . ABDOMINAL AORTOGRAM W/LOWER EXTREMITY N/A 07/10/2020   Procedure: ABDOMINAL AORTOGRAM W/LOWER EXTREMITY;  Surgeon: [name], MD;  Location: [facility];  Service: Cardiovascular;  Laterality: N/A;  Bilateral  . I & D EXTREMITY Left 06/12/2018   Procedure: Excisional Debridement left foot;  Surgeon: [name], MD;  [facility];  Service: Orthopedics;  Laterality: Left;  . IRRIGATION AND DEBRIDEMENT KNEE Right 07/18/2020   Procedure: IRRIGATION AND DEBRIDEMENT RIGHT KNEE;  Surgeon: Altamese [place], MD;  Location: [facility];  Service: Orthopedics;  Laterality: Right;  . KNEE ARTHROSCOPY Right 07/18/2020   Procedure: ARTHROSCOPY KNEE SYNOVECTOMY;  Surgeon: Altamese , MD;  Location: [facility];  Service: Orthopedics;  Laterality: Right;  . LOWER EXTREMITY ANGIOGRAM Right 07/20/2018     Right lower extremity angiogram  . LOWER EXTREMITY ANGIOGRAPHY Right 07/20/2018   Procedure: Lower Extremity Angiography;  Surgeon: [name], MD;  Location: [facility];  Service: Cardiovascular;  Laterality: Right;  . PERIPHERAL VASCULAR ATHERECTOMY Left 06/15/2018   Procedure: PERIPHERAL VASCULAR  ATHERECTOMY;  Surgeon: [name], MD;  Location: [facility];  Service: Cardiovascular;  Laterality: Left;  SFA  . PERIPHERAL VASCULAR BALLOON ANGIOPLASTY Left 06/15/2018   Procedure: PERIPHERAL VASCULAR BALLOON ANGIOPLASTY;  Surgeon: [name], MD;  Location: [facility];  Service: Cardiovascular;  Laterality: Left;  SFA  . PERIPHERAL VASCULAR BALLOON ANGIOPLASTY Right 07/20/2018   Procedure: PERIPHERAL VASCULAR BALLOON ANGIOPLASTY;  Surgeon: [name], MD;  Location: [facility];  Service: Cardiovascular;  Laterality: Right;  SFA WITH DRUG COATED   . PERIPHERAL VASCULAR INTERVENTION Left 12/30/2018   Procedure: PERIPHERAL VASCULAR INTERVENTION;  Surgeon: [name], MD;  Location: [facility];  Service: Cardiovascular;  Laterality: Left;  SFA    Social History   Tobacco Use  . Smoking  status: Former Smoker    Quit date: 06/14/2018    Years since quitting: 2.3  . Smokeless tobacco: Never Used  . Tobacco comment: pt reports quitting 2 months ago  Substance Use Topics  . Alcohol use: Not Currently    Alcohol/week: 18.0 standard drinks    Types: 18 Cans of beer per week    Comment: pt reports not drinking for past 6 months    Family History  Problem Relation Age of Onset  . Diabetes Mother   . Cancer Mother     Review of Systems  Constitutional: Negative for chills, fever and malaise/fatigue.  Respiratory: Negative for cough, shortness of breath and wheezing.   Cardiovascular: Negative for chest pain, palpitations and leg swelling.  Gastrointestinal: Negative for abdominal pain, blood in stool, constipation, diarrhea, heartburn, nausea and vomiting.  Genitourinary: Negative for dysuria, frequency and hematuria.  Musculoskeletal: Negative for back pain and joint pain.       Lower leg pain at night  Skin: Negative for rash.  Neurological: Positive for tingling. Negative for dizziness, weakness  and headaches.  Psychiatric/Behavioral: The patient has insomnia.      OBJECTIVE:  Today's Vitals   10/26/20 1610  BP: (!) 145/68  Pulse: 65  Temp: 98 F (36.7 C)  SpO2: 98%  Weight: 145 lb (65.8 kg)  Height: '5\' 5"'  (1.651 m)   Body mass index is 24.13 kg/m.   Physical Exam Vitals reviewed.  Constitutional:      Appearance: Normal appearance.  HENT:     Head: Normocephalic and atraumatic.  Eyes:     Conjunctiva/sclera: Conjunctivae normal.     Pupils: Pupils are equal, round, and reactive to light.  Cardiovascular:     Rate and Rhythm: Normal rate and regular rhythm.     Pulses:          Dorsalis pedis pulses are 1+ on the right side and 1+ on the left side.       Posterior tibial pulses are 1+ on the right side and 1+ on the left side.     Heart sounds: Normal heart sounds. No murmur heard. No friction rub. No gallop.   Pulmonary:     Effort: Pulmonary effort is normal. No respiratory distress.     Breath sounds: Normal breath sounds. No stridor. No wheezing or rales.  Abdominal:     General: Bowel sounds are normal.     Palpations: Abdomen is soft.     Tenderness: There is no abdominal tenderness.  Musculoskeletal:     Right lower leg: No edema.     Left lower leg: No edema.  Skin:    General: Skin is warm and dry.  Neurological:     General: No focal deficit present.     Mental Status: He is alert and oriented to person, place, and time.  Psychiatric:        Mood and Affect: Mood normal.        Behavior: Behavior normal.     Results for orders placed or performed in visit on 10/26/20 (from the past 24 hour(s))  POCT glucose (manual entry)     Status: Abnormal   Collection Time: 10/26/20  4:22 PM  Result Value Ref Range   POC Glucose 168 (A) 70 - 99 mg/dl  POCT glycosylated hemoglobin (Hb A1C)     Status: Abnormal   Collection Time: 10/26/20  4:27 PM  Result Value Ref Range   Hemoglobin A1C 8.2 (A) 4.0 - 5.6 %  HbA1c POC (<> result, manual entry)      HbA1c, POC (prediabetic range)     HbA1c, POC (controlled diabetic range)      No results found.   ASSESSMENT and PLAN  Problem List Items Addressed This Visit      Cardiovascular and Mediastinum   PAD (peripheral artery disease) (HCC)   Relevant Medications   clopidogrel (PLAVIX) 75 MG tablet   rosuvastatin (CRESTOR) 10 MG tablet   lisinopril (ZESTRIL) 10 MG tablet   gabapentin (NEURONTIN) 300 MG capsule   HTN (hypertension)   Relevant Medications   rosuvastatin (CRESTOR) 10 MG tablet   lisinopril (ZESTRIL) 10 MG tablet     Endocrine   Diabetes mellitus (HCC) - Primary   Relevant Medications   metFORMIN (GLUCOPHAGE) 500 MG tablet   rosuvastatin (CRESTOR) 10 MG tablet   lisinopril (ZESTRIL) 10 MG tablet   dapagliflozin propanediol (FARXIGA) 5 MG TABS tablet   Other Relevant Orders   POCT glucose (manual entry) (Completed)   POCT glycosylated hemoglobin (Hb A1C) (Completed)   Lipid Panel     Other   Malnutrition of moderate degree   Relevant Medications   lisinopril (ZESTRIL) 10 MG tablet       Plan . Discussed the importance of medication compliance . Starting Daily Metformin and Farxiga for DM . Starting daily Crestor . Restarting plavix and asprin for pvd ( Messaging Vascular for clarification) . Increase lisinopril from 5 to 70m  . Starting nightly gabapentin for pain and sleep    Return in about 4 weeks (around 11/23/2020).    KHuston FoleyJust, FNP-BC [facility] [place] [address]Ph.  [phone] [phone]

## 2020-10-26 NOTE — Patient Instructions (Addendum)
Diabetes Mellitus and Standards of [place] with and managing diabetes (diabetes mellitus) can be complicated. Your diabetes treatment may be managed by a team of health care providers, including:  A physician who specializes in diabetes (endocrinologist). You might also have visits with a nurse practitioner or physician assistant.  Nurses.  A registered dietitian.  A certified diabetes care and education specialist.  An exercise specialist.  A pharmacist.  An eye doctor.  A foot specialist (podiatrist).  A dental care provider.  A primary care provider.  A mental health care provider. How to manage your diabetes You can do many things to successfully manage your diabetes. Your health care providers will follow guidelines to help you get the best quality of care. Here are general guidelines for your diabetes management plan. Your health care providers may give you more specific instructions. Physical exams When you are diagnosed with diabetes, and each year after that, your health care provider will ask about your medical and family history. You will have a physical exam, which may include:  Measuring your height, weight, and body mass index (BMI).  Checking your blood pressure. This will be done at every routine medical visit. Your target blood pressure may vary depending on your medical conditions, your age, and other factors.  A thyroid exam.  A skin exam.  Screening for nerve damage (peripheral neuropathy). This may include checking the pulse in your legs and feet and the level of sensation in your hands and feet.  A foot exam to inspect the structure and skin of your feet, including checking for cuts, bruises, redness, blisters, sores, or other problems.  Screening for blood vessel (vascular) problems. This may include checking the pulse in your legs and feet and checking your temperature. Blood tests Depending on your treatment plan and your personal  needs, you may have the following tests:  Hemoglobin A1C (HbA1C). This test provides information about blood sugar (glucose) control over the previous 2-3 months. It is used to adjust your treatment plan, if needed. This test will be done: ? At least 2 times a year, if you are meeting your treatment goals. ? 4 times a year, if you are not meeting your treatment goals or if your goals have changed.  Lipid testing, including total cholesterol, LDL and HDL cholesterol, and triglyceride levels. ? The goal for LDL is less than 100 mg/dL (5.5 mmol/L). If you are at high risk for complications, the goal is less than 70 mg/dL (3.9 mmol/L). ? The goal for HDL is 40 mg/dL (2.2 mmol/L) or higher for men, and 50 mg/dL (2.8 mmol/L) or higher for women. An HDL cholesterol of 60 mg/dL (3.3 mmol/L) or higher gives some protection against heart disease. ? The goal for triglycerides is less than 150 mg/dL (8.3 mmol/L).  Liver function tests.  Kidney function tests.  Thyroid function tests.   Dental and eye exams  Visit your dentist two times a year.  If you have type 1 diabetes, your health care provider may recommend an eye exam within 5 years after you are diagnosed, and then once a year after your first exam. ? For children with type 1 diabetes, the health care provider may recommend an eye exam when your child is age 23 or older and has had diabetes for 3-5 years. After the first exam, your child should get an eye exam once a year.  If you have type 2 diabetes, your health care provider may recommend an eye exam  as soon as you are diagnosed, and then every 1-2 years after your first exam.   Immunizations  A yearly flu (influenza) vaccine is recommended annually for everyone 6 months or older. This is especially important if you have diabetes.  The pneumonia (pneumococcal) vaccine is recommended for everyone 2 years or older who has diabetes. If you are age 71 or older, you may get the pneumonia vaccine  as a series of two separate shots.  The hepatitis B vaccine is recommended for adults shortly after being diagnosed with diabetes. Adults and children with diabetes should receive all other vaccines according to age-specific recommendations from the Centers for Disease Control and Prevention (CDC). Mental and emotional health Screening for symptoms of eating disorders, anxiety, and depression is recommended at the time of diagnosis and after as needed. If your screening shows that you have symptoms, you may need more evaluation. You may work with a mental health care provider. Follow these instructions at home: Treatment plan You will monitor your blood glucose levels and may give yourself insulin. Your treatment plan will be reviewed at every medical visit. You and your health care provider will discuss:  How you are taking your medicines, including insulin.  Any side effects you have.  Your blood glucose level target goals.  How often you monitor your blood glucose level.  Lifestyle habits, such as activity level and tobacco, alcohol, and substance use. Education Your health care provider will assess how well you are monitoring your blood glucose levels and whether you are taking your insulin and medicines correctly. He or she may refer you to:  A certified diabetes care and education specialist to manage your diabetes throughout your life, starting at diagnosis.  A registered dietitian who can create and review your personal nutrition plan.  An exercise specialist who can discuss your activity level and exercise plan. General instructions  Take over-the-counter and prescription medicines only as told by your health care provider.  Keep all follow-up visits. This is important. Where to find support There are many diabetes support networks, including:  American Diabetes Association (ADA): diabetes.org  Defeat Diabetes Foundation: defeatdiabetes.org Where to find more  information  American Diabetes Association (ADA): www.diabetes.org  Association of Diabetes Care & Education Specialists (ADCES): diabeteseducator.org  International Diabetes Federation (IDF): https://www.munoz-bell.org/ Summary  Managing diabetes (diabetes mellitus) can be complicated. Your diabetes treatment may be managed by a team of health care providers.  Your health care providers follow guidelines to help you get the best quality care.  You should have physical exams, blood tests, blood pressure monitoring, immunizations, and screening tests regularly. Stay updated on how to manage your diabetes.  Your health care providers may also give you more specific instructions based on your individual health. This information is not intended to replace advice given to you by your health care provider. Make sure you discuss any questions you have with your health care provider. Document Revised: 03/02/2020 Document Reviewed: 03/02/2020 Elsevier Patient Education  [place].    If you have lab work done today you will be contacted with your lab results within the next 2 weeks.  If you have not heard from [place] then please contact us. The fastest way to get your results is to register for My Chart.   IF you received an x-ray today, you will receive an invoice from [facility]. Please contact [facility] at [phone] with questions or concerns regarding your invoice.   IF you received labwork today, you will  receive an invoice from [employer] [facility]. Please contact [facility] at [phone] with questions or concerns regarding your invoice.   Our billing staff will not be able to assist you with questions regarding bills from these companies.  You will be contacted with the lab results as soon as they are available. The fastest way to get your results is to activate your My Chart account. Instructions are located on the last page of this paperwork. If you have not heard from [place] regarding the  results in 2 weeks, please contact this office.

## 2020-10-27 ENCOUNTER — Ambulatory Visit: Payer: 59 | Admitting: Podiatry

## 2020-10-27 LAB — LIPID PANEL
Chol/HDL Ratio: 3.1 ratio (ref 0.0–5.0)
Cholesterol, Total: 191 mg/dL (ref 100–199)
HDL: 61 mg/dL (ref >39)
LDL Chol Calc (NIH): 111 mg/dL — ABNORMAL HIGH (ref 0–99)
Triglycerides: 107 mg/dL (ref 0–149)
VLDL Cholesterol Cal: 19 mg/dL (ref 5–40)

## 2020-11-06 ENCOUNTER — Ambulatory Visit (INDEPENDENT_AMBULATORY_CARE_PROVIDER_SITE_OTHER): Payer: 59 | Admitting: Podiatry

## 2020-11-06 ENCOUNTER — Other Ambulatory Visit: Payer: Self-pay

## 2020-11-06 ENCOUNTER — Encounter: Payer: Self-pay | Admitting: Podiatry

## 2020-11-06 DIAGNOSIS — I739 Peripheral vascular disease, unspecified: Secondary | ICD-10-CM | POA: Diagnosis not present

## 2020-11-06 DIAGNOSIS — L97522 Non-pressure chronic ulcer of other part of left foot with fat layer exposed: Secondary | ICD-10-CM | POA: Diagnosis not present

## 2020-11-06 NOTE — Progress Notes (Signed)
Subjective:   Patient ID: Stanley Chaney, male   DOB: 60 y.o.   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

## 2020-11-16 ENCOUNTER — Encounter: Payer: Self-pay | Admitting: Orthopedic Surgery

## 2020-11-20 ENCOUNTER — Telehealth: Payer: Self-pay | Admitting: Family Medicine

## 2020-11-23 ENCOUNTER — Ambulatory Visit: Payer: 59 | Admitting: Family Medicine

## 2020-11-27 ENCOUNTER — Other Ambulatory Visit: Payer: Self-pay

## 2020-11-27 ENCOUNTER — Ambulatory Visit (INDEPENDENT_AMBULATORY_CARE_PROVIDER_SITE_OTHER): Payer: 59 | Admitting: Registered Nurse

## 2020-11-27 VITALS — BP 169/76 | HR 96 | Temp 98.0°F | Resp 18 | Ht 65.0 in | Wt 146.6 lb

## 2020-11-27 DIAGNOSIS — Z01818 Encounter for other preprocedural examination: Secondary | ICD-10-CM | POA: Diagnosis not present

## 2020-11-27 DIAGNOSIS — E1159 Type 2 diabetes mellitus with other circulatory complications: Secondary | ICD-10-CM | POA: Diagnosis not present

## 2020-11-27 LAB — POCT GLYCOSYLATED HEMOGLOBIN (HGB A1C): Hemoglobin A1C: 7.9 % — AB (ref 4.0–5.6)

## 2020-11-27 NOTE — Patient Instructions (Addendum)
[facility] [address], [address] [place], [place] [address]     [facility] [address] [place], [place] [address]        If you have lab work done today you will be contacted with your lab results within the next 2 weeks.  If you have not heard from us then please contact us. The fastest way to get your results is to register for My Chart.   IF you received an x-ray today, you will receive an invoice from [facility]. Please contact [facility] at [phone] with questions or concerns regarding your invoice.   IF you received labwork today, you will receive an invoice from [facility]. Please contact [facility] at [phone] with questions or concerns regarding your invoice.   Our billing staff will not be able to assist you with questions regarding bills from these companies.  You will be contacted with the lab results as soon as they are available. The fastest way to get your results is to activate your My Chart account. Instructions are located on the last page of this paperwork. If you have not heard from us regarding the results in 2 weeks, please contact this office.      

## 2020-12-01 LAB — URINALYSIS
Bilirubin, UA: NEGATIVE
Ketones, UA: NEGATIVE
Leukocytes,UA: NEGATIVE
Nitrite, UA: NEGATIVE
Protein,UA: NEGATIVE
RBC, UA: NEGATIVE
Specific Gravity, UA: 1.024 (ref 1.005–1.030)
Urobilinogen, Ur: 0.2 mg/dL (ref 0.2–1.0)
pH, UA: 5.5 (ref 5.0–7.5)

## 2020-12-01 LAB — COMPREHENSIVE METABOLIC PANEL
ALT: 16 IU/L (ref 0–44)
AST: 14 IU/L (ref 0–40)
Albumin/Globulin Ratio: 1.5 (ref 1.2–2.2)
Albumin: 5 g/dL — ABNORMAL HIGH (ref 3.8–4.8)
Alkaline Phosphatase: 162 IU/L — ABNORMAL HIGH (ref 44–121)
BUN/Creatinine Ratio: 25 — ABNORMAL HIGH (ref 10–24)
BUN: 24 mg/dL (ref 8–27)
Bilirubin Total: 0.2 mg/dL (ref 0.0–1.2)
CO2: 21 mmol/L (ref 20–29)
Calcium: 10.3 mg/dL — ABNORMAL HIGH (ref 8.6–10.2)
Chloride: 102 mmol/L (ref 96–106)
Creatinine, Ser: 0.95 mg/dL (ref 0.76–1.27)
Globulin, Total: 3.3 g/dL (ref 1.5–4.5)
Glucose: 171 mg/dL — ABNORMAL HIGH (ref 65–99)
Potassium: 4.9 mmol/L (ref 3.5–5.2)
Sodium: 141 mmol/L (ref 134–144)
Total Protein: 8.3 g/dL (ref 6.0–8.5)
eGFR: 91 mL/min/{1.73_m2} (ref >59)

## 2020-12-01 LAB — LIPID PANEL
Chol/HDL Ratio: 2.2 ratio (ref 0.0–5.0)
Cholesterol, Total: 136 mg/dL (ref 100–199)
HDL: 61 mg/dL (ref >39)
LDL Chol Calc (NIH): 63 mg/dL (ref 0–99)
Triglycerides: 58 mg/dL (ref 0–149)
VLDL Cholesterol Cal: 12 mg/dL (ref 5–40)

## 2020-12-01 LAB — CBC WITH DIFFERENTIAL
Basophils Absolute: 0.1 10*3/uL (ref 0.0–0.2)
Basos: 1 %
EOS (ABSOLUTE): 0.2 10*3/uL (ref 0.0–0.4)
Eos: 4 %
Hematocrit: 43.4 % (ref 37.5–51.0)
Hemoglobin: 13.8 g/dL (ref 13.0–17.7)
Immature Grans (Abs): 0 10*3/uL (ref 0.0–0.1)
Immature Granulocytes: 1 %
Lymphocytes Absolute: 2.2 10*3/uL (ref 0.7–3.1)
Lymphs: 35 %
MCH: 25.5 pg — ABNORMAL LOW (ref 26.6–33.0)
MCHC: 31.8 g/dL (ref 31.5–35.7)
MCV: 80 fL (ref 79–97)
Monocytes Absolute: 0.7 10*3/uL (ref 0.1–0.9)
Monocytes: 11 %
Neutrophils Absolute: 3.2 10*3/uL (ref 1.4–7.0)
Neutrophils: 48 %
RBC: 5.42 x10E6/uL (ref 4.14–5.80)
RDW: 14.5 % (ref 11.6–15.4)
WBC: 6.5 10*3/uL (ref 3.4–10.8)

## 2020-12-01 LAB — PROTIME-INR
INR: 1 (ref 0.9–1.2)
Prothrombin Time: 10 s (ref 9.1–12.0)

## 2020-12-01 LAB — TSH: TSH: 0.888 u[IU]/mL (ref 0.450–4.500)

## 2020-12-03 ENCOUNTER — Other Ambulatory Visit: Payer: Self-pay | Admitting: Family Medicine

## 2020-12-03 DIAGNOSIS — I739 Peripheral vascular disease, unspecified: Secondary | ICD-10-CM

## 2020-12-04 NOTE — Telephone Encounter (Signed)
Sent the the wrong pool.

## 2020-12-09 IMAGING — MR MR KNEE*R* W/O CM
8 series · 40 of 40 positions shown · non-contrast
Comparison: Radiograph same day

CLINICAL DATA: Right knee pain, question of infection

EXAM:
MRI OF THE RIGHT KNEE WITHOUT CONTRAST
TECHNIQUE: Multiplanar, multisequence MR imaging of the knee was performed. No
intravenous contrast was administered.

[Series 8: T2 fat-sat · axial · right · 4.0mm · 0.36mm/px · z∈[-69,+76]mm · 5 of 30 slices shown (1 of 3)]
[im 1/30]
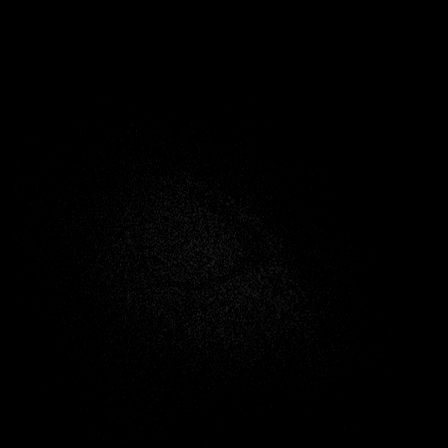
[im 8/30]
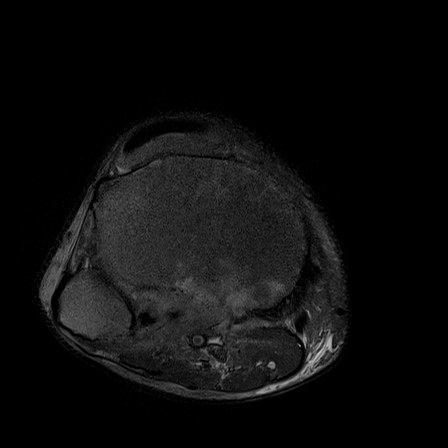
[im 15/30]
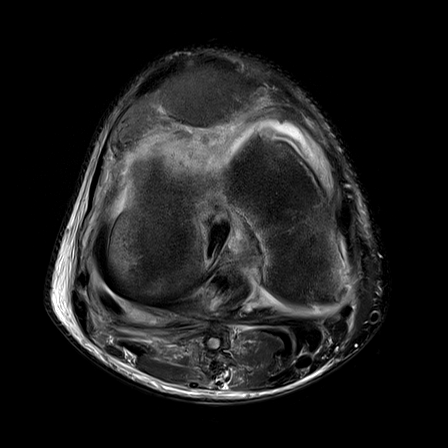
[im 22/30]
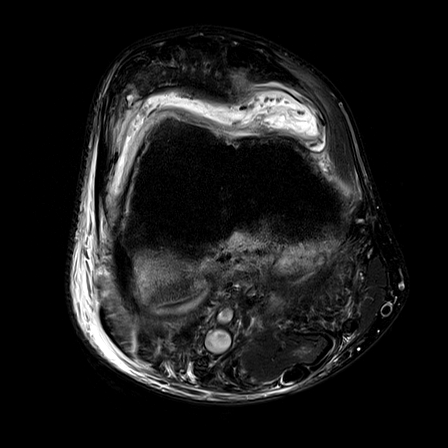
[im 30/30]
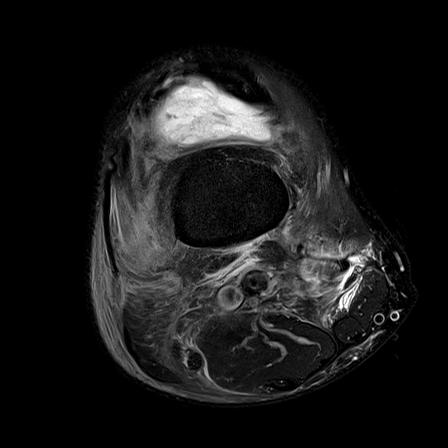

[Series 9: T1 · coronal · right · 4.0mm · 0.49mm/px · 5 of 30 slices shown]
[im 1/30]
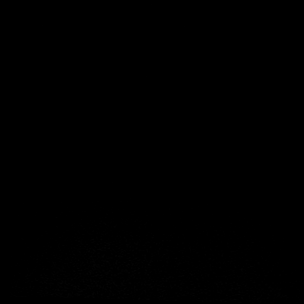
[im 8/30]
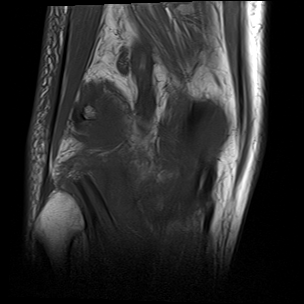
[im 15/30]
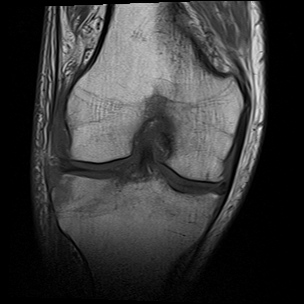
[im 22/30]
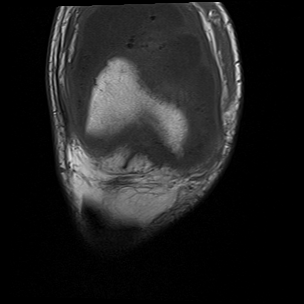
[im 30/30]
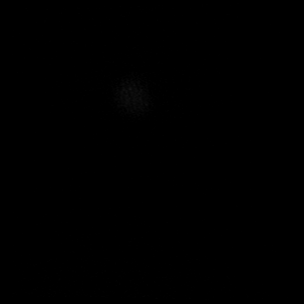

[Series 10: T2 fat-sat · coronal · right · 4.0mm · 0.59mm/px · 5 of 30 slices shown (2 of 3)]
[im 1/30]
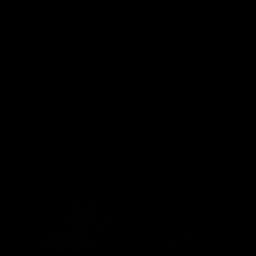
[im 8/30]
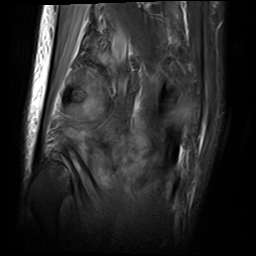
[im 15/30]
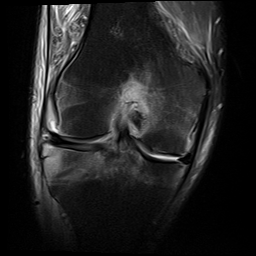
[im 22/30]
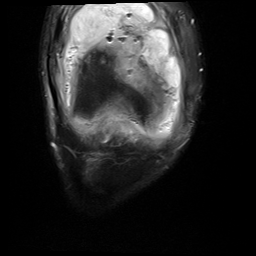
[im 30/30]
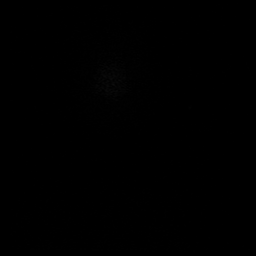

[Series 11: PD fat-sat · coronal · right · 3.0mm · 0.44mm/px · 5 of 34 slices shown (1 of 2)]
[im 1/34]
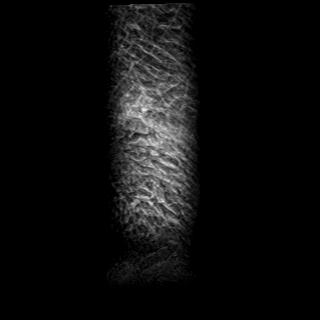
[im 9/34]
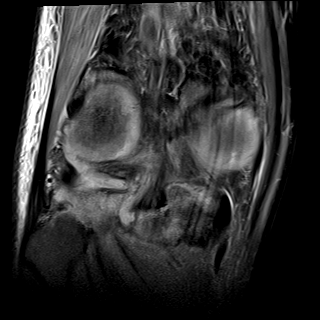
[im 17/34]
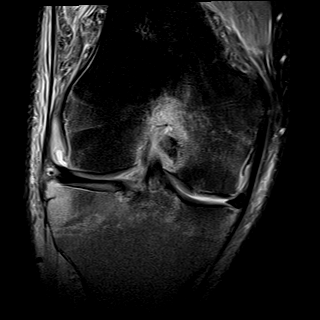
[im 25/34]
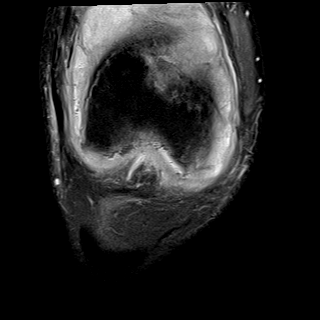
[im 34/34]
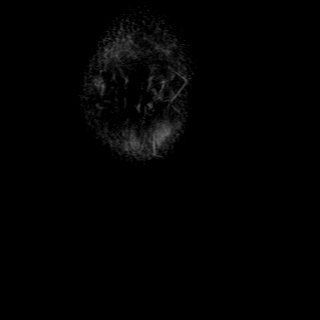

[Series 12: PD fat-sat · sagittal · right · 3.0mm · 0.52mm/px · 6 of 36 slices shown (2 of 2)]
[im 1/36]
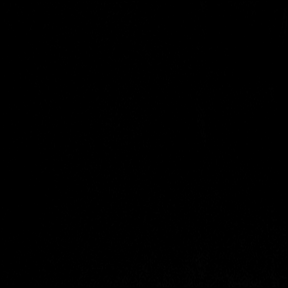
[im 8/36]
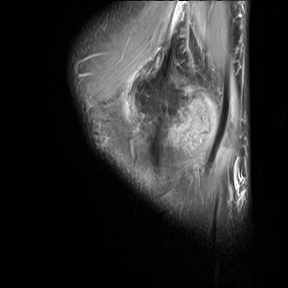
[im 15/36]
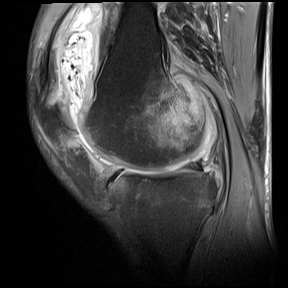
[im 22/36]
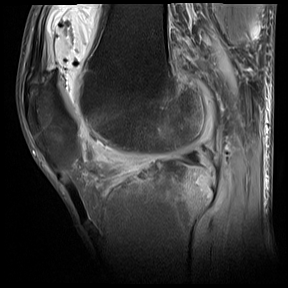
[im 29/36]
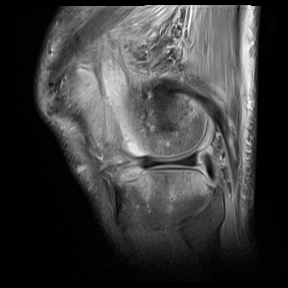
[im 36/36]
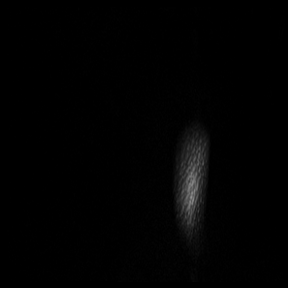

[Series 13: T2 fat-sat · sagittal · right · 3.0mm · 0.52mm/px · 6 of 36 slices shown (3 of 3)]
[im 1/36]
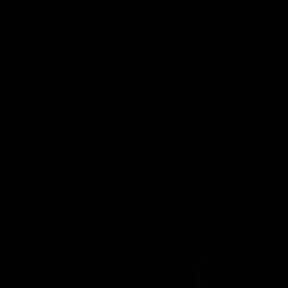
[im 8/36]
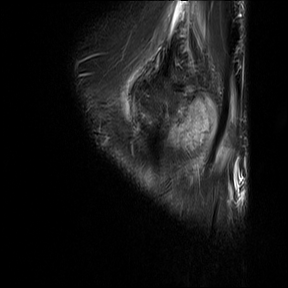
[im 15/36]
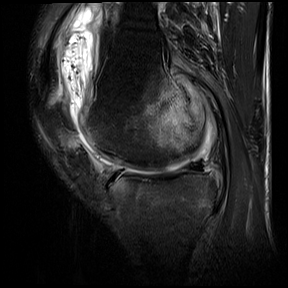
[im 22/36]
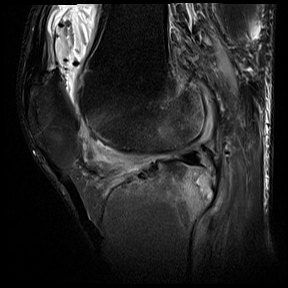
[im 29/36]
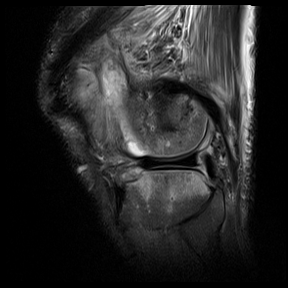
[im 36/36]
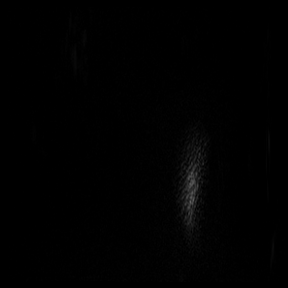

[Series 14: PD · coronal · right · 2.0mm · 0.47mm/px · 2 of 14 slices shown]
[im 1/14]
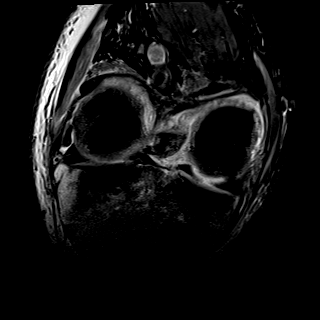
[im 14/14]
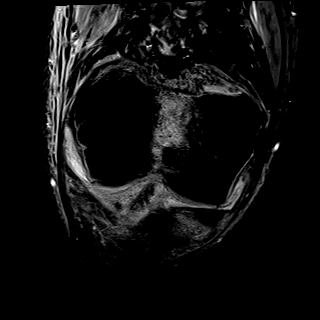

[Series 15: STIR · coronal · right · 3.5mm · 0.55mm/px · 6 of 38 slices shown]
[im 1/38]
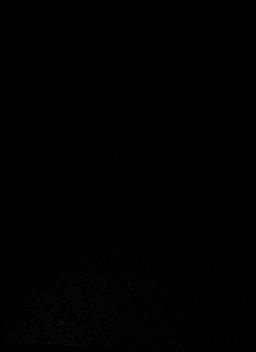
[im 8/38]
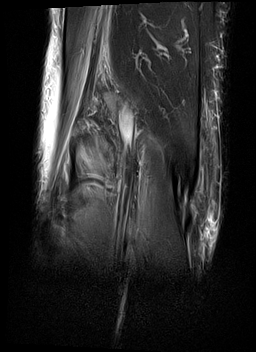
[im 15/38]
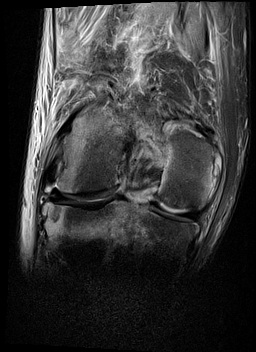
[im 23/38]
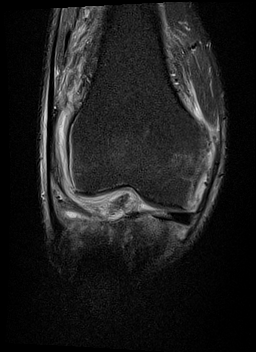
[im 30/38]
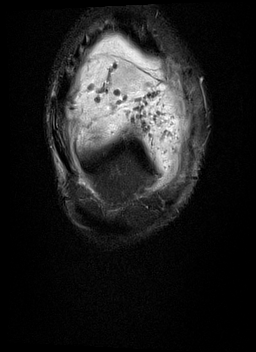
[im 38/38]
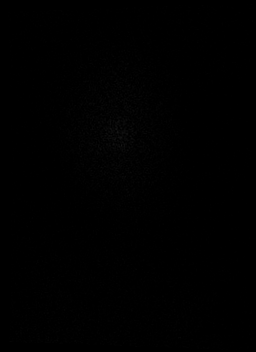

[40 of 40 positions shown; findings below may reference images not displayed]

FINDINGS: Bones/Joint/Cartilage

There is increased T2 signal seen throughout the bilateral femoral
condyles and posterior proximal tibia with associated areas of
subtle T1 hypointensity. There is also area cortical irregularity
seen at the lateral tibial plateau. A large knee joint effusion is
seen with synovial thickening emphysema and scattered debris. Mild
thinning and fissuring seen the weight-bearing surface of the medial
femoral condyle. There is also chondral fissuring seen within the
central patellar apex and central trochlear surface.

Ligaments

There is diffusely increased signal seen throughout the PCL, it is
intact. The ACL is intact.

Muscles and Tendons
Diffusely increased feathery signal seen within the muscles
surrounding the knee. The patellar and quadriceps tendon intact.

Soft tissue
Diffuse subcutaneous edema seen surrounding the knee.
IMPRESSION: Findings suggestive osteomyelitis of the bilateral femoral condyles
proximal tibia. No osseous fracture.

Large knee joint effusion with air which could be concerning for
septic arthropathy with gas-forming organisms.

Intrasubstance sprain/degeneration throughout the PCL.

## 2020-12-09 IMAGING — DX DG KNEE COMPLETE 4+V*R*
4 series · 4 of 4 positions shown · non-contrast
Comparison: None.

CLINICAL DATA: 59-year-old male with right knee pain.

EXAM:
RIGHT KNEE - COMPLETE 4+ VIEW

[knee obl (1 of 2)]
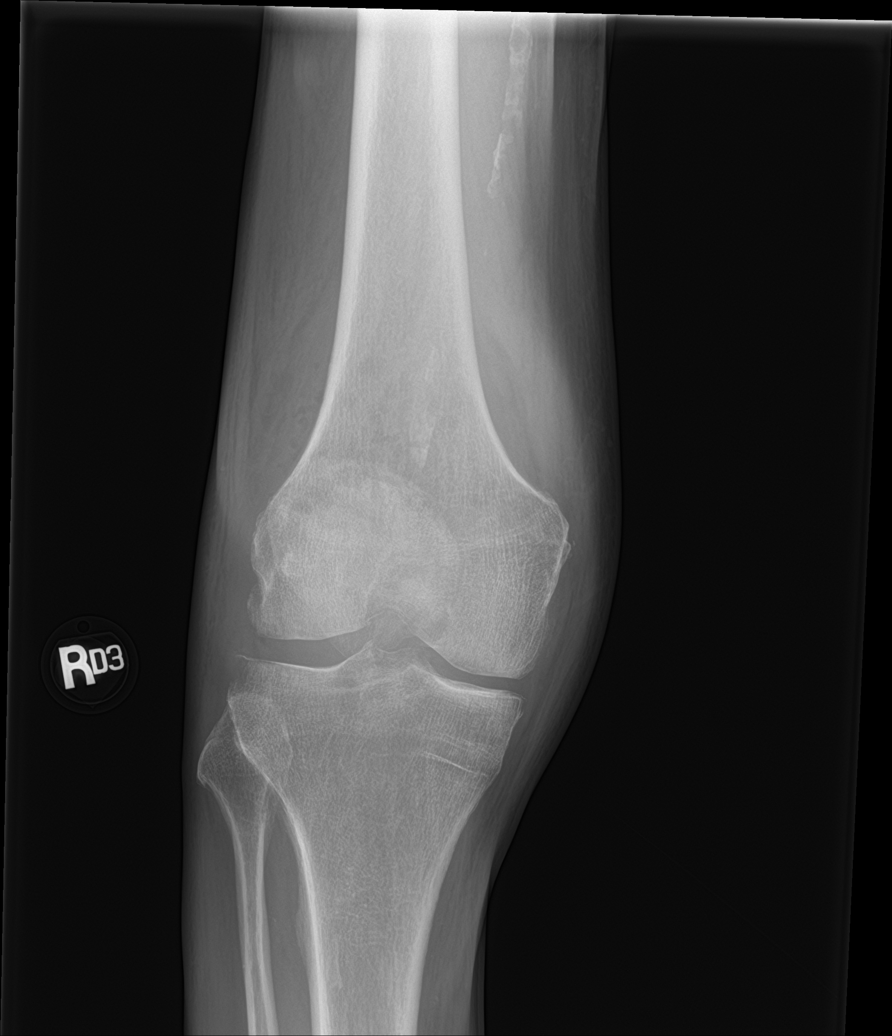

[knee lat]
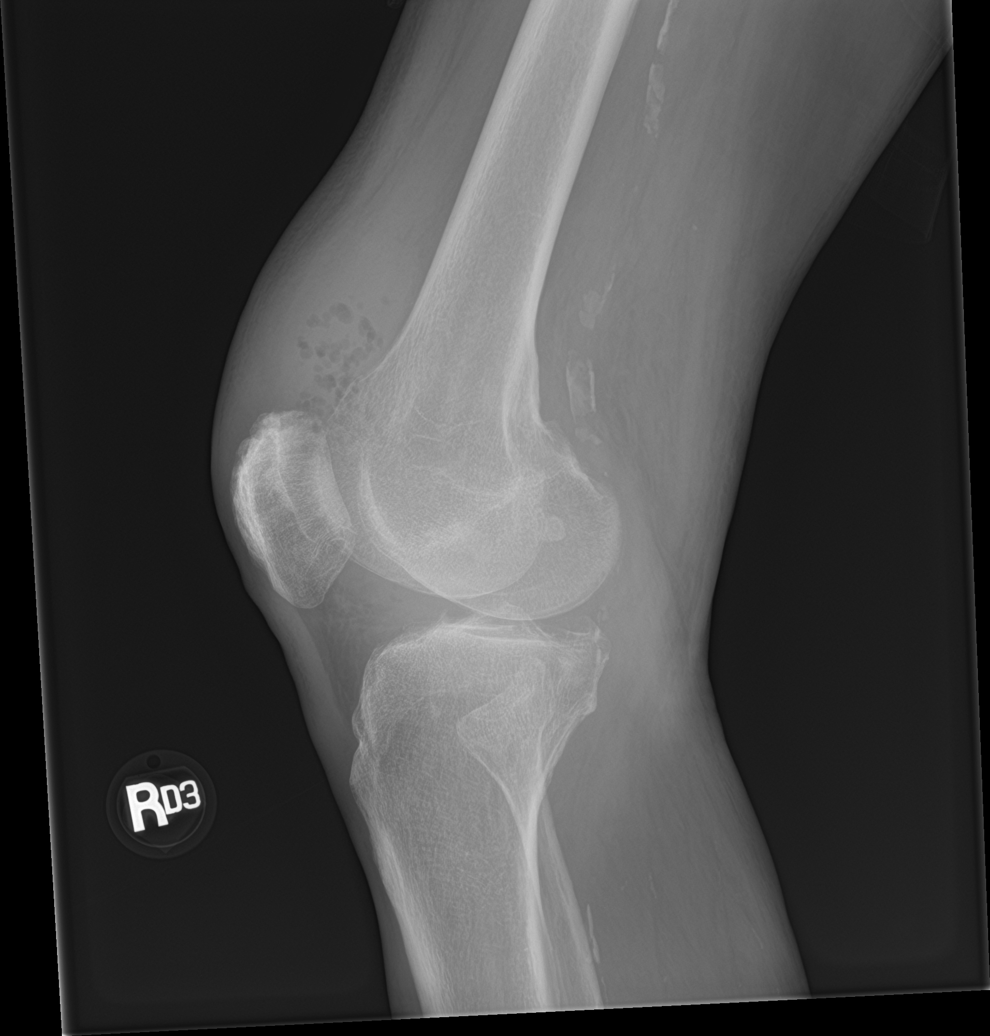

[knee obl (2 of 2)]
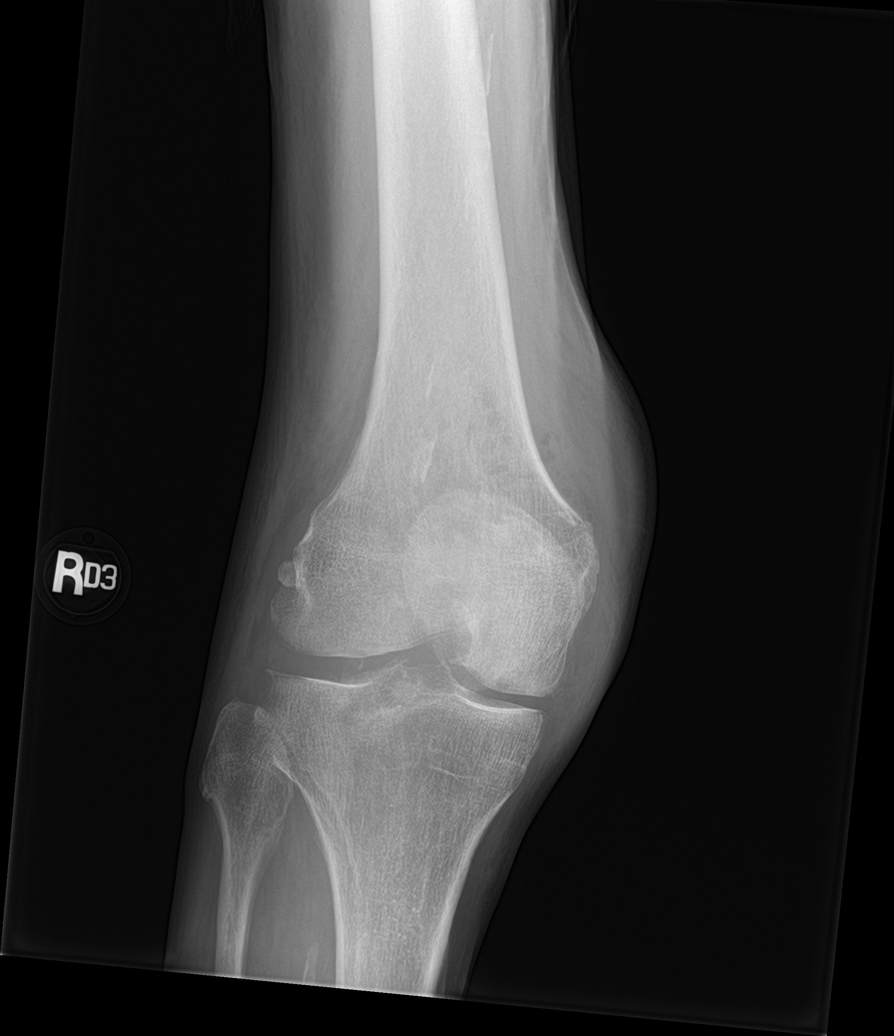

[knee ap]
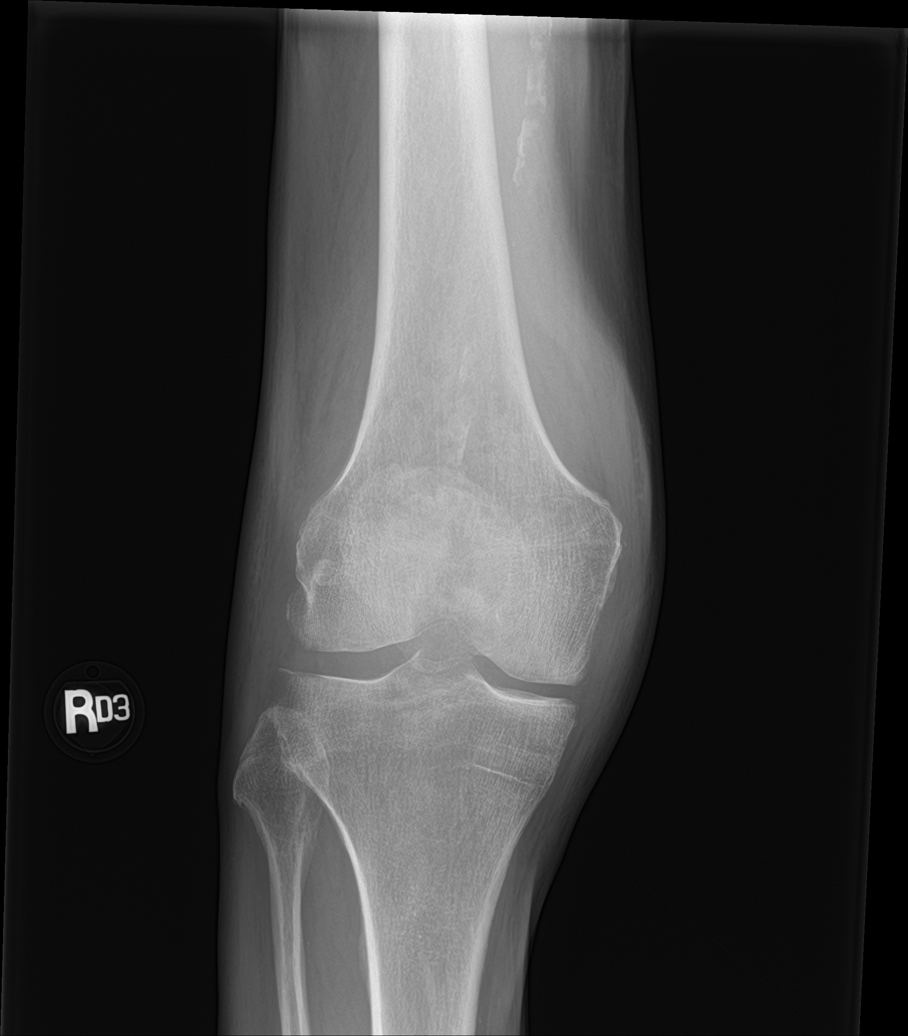

[4 of 4 positions shown; findings below may reference images not displayed]

FINDINGS: There is no acute fracture or dislocation. The bones are osteopenic.
There is focal area of demineralization along the lateral aspect of
the lateral tibial plateau which although may be related to
osteopenia is concerning for osteomyelitis. Clinical correlation
recommended. There is a moderate size suprapatellar effusion with
pockets of air. There is mild skin thickening along the anterior
knee. Vascular calcifications noted.
IMPRESSION: 1. No acute fracture or dislocation.
2. Moderate suprapatellar effusion with pockets of air concerning
for an infectious process. Further evaluation with MRI or joint
aspiration recommended.
3. Focal area of demineralization along the lateral aspect of the
lateral tibial plateau concerning for osteomyelitis.

## 2021-02-28 ENCOUNTER — Telehealth: Payer: Self-pay | Admitting: Orthopedic Surgery

## 2021-02-28 NOTE — Telephone Encounter (Signed)
Pt son called and was stating the pt primary doctor said it was okay for pt to have surgery and he was wondering why he hadn't heard anything from [place] about surgery?   CB [phone]

## 2021-02-28 NOTE — Telephone Encounter (Signed)
This pt was seen in Feb for a septic knee and osteo 10/2020 he was supposed to get diabetes under better control. The last A1c I see in the chart it has come down but is a 7.9 from 11/2020. His son is calling and stating that he has clearance from his medical doctor and wants to set up his AKA. Do you have anything on this? Does he need to come back in for office visit?

## 2021-03-06 ENCOUNTER — Telehealth: Payer: Self-pay | Admitting: Orthopedic Surgery

## 2021-03-06 NOTE — Telephone Encounter (Signed)
Venora Maples the pts son called stating they are wanting to get scheduled for surgery. Venora Maples states he called last week but hadn't heard anything back; pt would like a CB ASAP to go ahead and get this set up.   [phone]

## 2021-03-09 ENCOUNTER — Other Ambulatory Visit: Payer: Self-pay | Admitting: Physician Assistant

## 2021-03-15 ENCOUNTER — Other Ambulatory Visit (HOSPITAL_COMMUNITY)
Admission: RE | Admit: 2021-03-15 | Discharge: 2021-03-15 | Disposition: A | Discharge status: Home / Self Care | Payer: MEDICAID | Source: Ambulatory Visit | Attending: Orthopedic Surgery | Admitting: Orthopedic Surgery

## 2021-03-15 ENCOUNTER — Other Ambulatory Visit: Payer: Self-pay

## 2021-03-15 ENCOUNTER — Encounter (HOSPITAL_COMMUNITY): Payer: Self-pay | Admitting: Orthopedic Surgery

## 2021-03-15 DIAGNOSIS — Z01812 Encounter for preprocedural laboratory examination: Secondary | ICD-10-CM | POA: Insufficient documentation

## 2021-03-15 DIAGNOSIS — Z20822 Contact with and (suspected) exposure to covid-19: Secondary | ICD-10-CM | POA: Insufficient documentation

## 2021-03-15 NOTE — Anesthesia Preprocedure Evaluation (Addendum)
Anesthesia Evaluation  Patient identified by MRN, date of birth, ID band Patient awake    Reviewed: Allergy & Precautions, NPO status , Patient's Chart, lab work & pertinent test results  History of Anesthesia Complications Negative for: history of anesthetic complications  Airway Mallampati: II  TM Distance: >3 FB Neck ROM: Full    Dental  (+) Dental Advisory Given   Pulmonary neg pulmonary ROS, former smoker,    Pulmonary exam normal        Cardiovascular hypertension, Pt. on medications + Peripheral Vascular Disease  Normal cardiovascular exam     Neuro/Psych negative neurological ROS     GI/Hepatic negative GI ROS, Neg liver ROS,   Endo/Other  diabetes, Oral Hypoglycemic Agents  Renal/GU negative Renal ROS  negative genitourinary   Musculoskeletal  (+) Arthritis ,   Abdominal   Peds  Hematology negative hematology ROS (+) Plavix   Anesthesia Other Findings  Septic arthritis/osteomyelitis of right knee  Reproductive/Obstetrics                            Anesthesia Physical Anesthesia Plan  ASA: 3  Anesthesia Plan: General   Post-op Pain Management: GA combined w/ Regional for post-op pain   Induction: Intravenous  PONV Risk Score and Plan: 2 and Ondansetron, Dexamethasone, Midazolam and Treatment may vary due to age or medical condition  Airway Management Planned: LMA  Additional Equipment: None  Intra-op Plan:   Post-operative Plan: Extubation in OR  Informed Consent: I have reviewed the patients History and Physical, chart, labs and discussed the procedure including the risks, benefits and alternatives for the proposed anesthesia with the patient or authorized representative who has indicated his/her understanding and acceptance.     Dental advisory given  Plan Discussed with:   Anesthesia Plan Comments:        Anesthesia Quick Evaluation

## 2021-03-15 NOTE — Progress Notes (Signed)
PCP - Dr. Carlota Raspberry Cardiologist - denies EKG - 11/27/20 Chest x-ray - 07/17/20 ECHO -  Cardiac Cath -  CPAP -   Fasting Blood Sugar:  180-220 Checks Blood Sugar:  1x/day  Blood Thinner Instructions: Follow your surgeon's instructions on when to stop Aspirin and Plavix.  If no instructions were given by your surgeon then you will need to call the office to get those instructions.    Aspirin Instructions: see above  ERAS Protcol - yes  COVID TEST- 03/15/21  Anesthesia review: n/a  -------------  SDW INSTRUCTIONS:  Your procedure is scheduled on 7/8 Friday. Please report to [facility] Main Entrance "A" at 05:30 A.M., and check in at the Admitting office. Call this number if you have problems the morning of surgery: [phone]   Remember: Do not eat after midnight the night before your surgery  You may drink clear liquids until 04:30 AM the morning of your surgery.   Clear liquids allowed are: Water, Non-Citrus Juices (without pulp), Carbonated Beverages, Clear Tea, Black Coffee Only, and Gatorade   Medications to take morning of surgery with a sip of water include: Tylenol if needed rosuvastatin (CRESTOR)   As of today, STOP taking any Aspirin and Plavix (unless otherwise instructed by your surgeon), Aleve, Naproxen, Ibuprofen, Motrin, Advil, Goody's, BC's, all herbal medications, fish oil, and all vitamins.  ** PLEASE check your blood sugar the morning of your surgery when you wake up and every 2 hours until you get to the Short Stay unit.  If your blood sugar is less than 70 mg/dL, you will need to treat for low blood sugar: Do not take insulin. Treat a low blood sugar (less than 70 mg/dL) with  cup of clear juice (cranberry or apple), 4 glucose tablets, OR glucose gel. Recheck blood sugar in 15 minutes after treatment (to make sure it is greater than 70 mg/dL). If your blood sugar is not greater than 70 mg/dL on recheck, call [phone] for further  instructions.  dapagliflozin propanediol (FARXIGA)  7/7 - none  7/8 - none  metFORMIN (GLUCOPHAGE) 7/7 - take as usual 7/8 - none    The Morning of Surgery Do not wear jewelry Do not wear lotions, powders, colognes, or deodorant Men may shave face and neck. Do not bring valuables to the hospital. [facility] is not responsible for any belongings or valuables.  If you are a smoker, DO NOT Smoke 24 hours prior to surgery If you wear a CPAP at night please bring your mask the morning of surgery  Remember that you must have someone to transport you home after your surgery, and remain with you for 24 hours if you are discharged the same day.  Please bring cases for contacts, glasses, hearing aids, dentures or bridgework because it cannot be worn into surgery.   Patients discharged the day of surgery will not be allowed to drive home.   Please shower the NIGHT BEFORE/MORNING OF SURGERY (use antibacterial soap like DIAL soap if possible). Wear comfortable clothes the morning of surgery. Oral Hygiene is also important to reduce your risk of infection.  Remember - BRUSH YOUR TEETH THE MORNING OF SURGERY WITH YOUR REGULAR TOOTHPASTE  Patient denies shortness of breath, fever, cough and chest pain.

## 2021-03-16 ENCOUNTER — Inpatient Hospital Stay (HOSPITAL_COMMUNITY)
Admission: RE | Admit: 2021-03-16 | Discharge: 2021-03-22 | DRG: 617 | Disposition: A | Discharge status: Home / Self Care | Payer: 59 | Attending: Orthopedic Surgery | Admitting: Orthopedic Surgery

## 2021-03-16 ENCOUNTER — Inpatient Hospital Stay (HOSPITAL_COMMUNITY): Payer: 59 | Admitting: Certified Registered Nurse Anesthetist

## 2021-03-16 ENCOUNTER — Encounter (HOSPITAL_COMMUNITY)
Admission: RE | Disposition: A | Discharge status: Home / Self Care | Payer: Self-pay | Source: Home / Self Care | Attending: Orthopedic Surgery

## 2021-03-16 ENCOUNTER — Encounter (HOSPITAL_COMMUNITY): Payer: Self-pay | Admitting: Orthopedic Surgery

## 2021-03-16 DIAGNOSIS — E1169 Type 2 diabetes mellitus with other specified complication: Secondary | ICD-10-CM | POA: Diagnosis present

## 2021-03-16 DIAGNOSIS — Z79899 Other long term (current) drug therapy: Secondary | ICD-10-CM

## 2021-03-16 DIAGNOSIS — Z4781 Encounter for orthopedic aftercare following surgical amputation: Secondary | ICD-10-CM | POA: Diagnosis not present

## 2021-03-16 DIAGNOSIS — M8600 Acute hematogenous osteomyelitis, unspecified site: Secondary | ICD-10-CM

## 2021-03-16 DIAGNOSIS — M1711 Unilateral primary osteoarthritis, right knee: Secondary | ICD-10-CM | POA: Diagnosis present

## 2021-03-16 DIAGNOSIS — E119 Type 2 diabetes mellitus without complications: Secondary | ICD-10-CM | POA: Diagnosis not present

## 2021-03-16 DIAGNOSIS — S78119A Complete traumatic amputation at level between unspecified hip and knee, initial encounter: Secondary | ICD-10-CM | POA: Diagnosis not present

## 2021-03-16 DIAGNOSIS — M869 Osteomyelitis, unspecified: Secondary | ICD-10-CM | POA: Diagnosis present

## 2021-03-16 DIAGNOSIS — Z833 Family history of diabetes mellitus: Secondary | ICD-10-CM

## 2021-03-16 DIAGNOSIS — Z87891 Personal history of nicotine dependence: Secondary | ICD-10-CM

## 2021-03-16 DIAGNOSIS — Z7984 Long term (current) use of oral hypoglycemic drugs: Secondary | ICD-10-CM

## 2021-03-16 DIAGNOSIS — Z7982 Long term (current) use of aspirin: Secondary | ICD-10-CM | POA: Diagnosis not present

## 2021-03-16 DIAGNOSIS — M7989 Other specified soft tissue disorders: Secondary | ICD-10-CM | POA: Diagnosis not present

## 2021-03-16 DIAGNOSIS — E1151 Type 2 diabetes mellitus with diabetic peripheral angiopathy without gangrene: Secondary | ICD-10-CM | POA: Diagnosis present

## 2021-03-16 DIAGNOSIS — I739 Peripheral vascular disease, unspecified: Secondary | ICD-10-CM | POA: Diagnosis not present

## 2021-03-16 DIAGNOSIS — C499 Malignant neoplasm of connective and soft tissue, unspecified: Secondary | ICD-10-CM | POA: Diagnosis not present

## 2021-03-16 DIAGNOSIS — I10 Essential (primary) hypertension: Secondary | ICD-10-CM | POA: Diagnosis not present

## 2021-03-16 DIAGNOSIS — S78111A Complete traumatic amputation at level between right hip and knee, initial encounter: Secondary | ICD-10-CM

## 2021-03-16 DIAGNOSIS — R7989 Other specified abnormal findings of blood chemistry: Secondary | ICD-10-CM | POA: Diagnosis not present

## 2021-03-16 HISTORY — PX: AMPUTATION: SHX166

## 2021-03-16 LAB — GLUCOSE, CAPILLARY
Glucose-Capillary: 158 mg/dL — ABNORMAL HIGH (ref 70–99)
Glucose-Capillary: 158 mg/dL — ABNORMAL HIGH (ref 70–99)
Glucose-Capillary: 151 mg/dL — ABNORMAL HIGH (ref 70–99)
Glucose-Capillary: 162 mg/dL — ABNORMAL HIGH (ref 70–99)
Glucose-Capillary: 203 mg/dL — ABNORMAL HIGH (ref 70–99)
Glucose-Capillary: 178 mg/dL — ABNORMAL HIGH (ref 70–99)

## 2021-03-16 LAB — CBC
HCT: 42.4 % (ref 39.0–52.0)
Hemoglobin: 13.6 g/dL (ref 13.0–17.0)
MCH: 26.6 pg (ref 26.0–34.0)
MCHC: 32.1 g/dL (ref 30.0–36.0)
MCV: 82.8 fL (ref 80.0–100.0)
Platelets: 314 10*3/uL (ref 150–400)
RBC: 5.12 MIL/uL (ref 4.22–5.81)
RDW: 13.6 % (ref 11.5–15.5)
WBC: 6.6 10*3/uL (ref 4.0–10.5)
nRBC: 0 % (ref 0.0–0.2)

## 2021-03-16 LAB — ABO/RH: ABO/RH(D): B POS

## 2021-03-16 LAB — TYPE AND SCREEN
ABO/RH(D): B POS
Antibody Screen: NEGATIVE

## 2021-03-16 LAB — BASIC METABOLIC PANEL
Anion gap: 9 (ref 5–15)
BUN: 14 mg/dL (ref 8–23)
CO2: 27 mmol/L (ref 22–32)
Calcium: 9.7 mg/dL (ref 8.9–10.3)
Chloride: 103 mmol/L (ref 98–111)
Creatinine, Ser: 0.89 mg/dL (ref 0.61–1.24)
GFR, Estimated: >60 mL/min (ref >60)
Glucose, Bld: 173 mg/dL — ABNORMAL HIGH (ref 70–99)
Potassium: 4.7 mmol/L (ref 3.5–5.1)
Sodium: 139 mmol/L (ref 135–145)

## 2021-03-16 LAB — SARS CORONAVIRUS 2 (TAT 6-24 HRS): SARS Coronavirus 2: NEGATIVE

## 2021-03-16 SURGERY — AMPUTATION, ABOVE KNEE
Anesthesia: General | Site: Knee | Laterality: Right

## 2021-03-16 MED ORDER — LACTATED RINGERS IV SOLN: INTRAVENOUS | Status: DC

## 2021-03-16 MED ORDER — GUAIFENESIN-DM 100-10 MG/5ML PO SYRP: 15.0000 mL | ORAL_SOLUTION | ORAL | Status: DC | PRN

## 2021-03-16 MED ORDER — OXYCODONE HCL 5 MG PO TABS
5.0000 mg | ORAL_TABLET | ORAL | Status: DC | PRN
  Administered 2021-03-21: 10 mg via ORAL
  Filled 2021-03-16 (×2): qty 2

## 2021-03-16 MED ORDER — JUVEN PO PACK
1.0000 | PACK | Freq: Two times a day (BID) | ORAL | Status: DC
  Administered 2021-03-17 – 2021-03-22 (×10): 1 via ORAL
  Filled 2021-03-16 (×10): qty 1

## 2021-03-16 MED ORDER — GABAPENTIN 300 MG PO CAPS
300.0000 mg | ORAL_CAPSULE | Freq: Every day | ORAL | Status: DC
  Administered 2021-03-16 – 2021-03-21 (×6): 300 mg via ORAL
  Filled 2021-03-16 (×6): qty 1

## 2021-03-16 MED ORDER — MAGNESIUM CITRATE PO SOLN: 1.0000 | Freq: Once | ORAL | Status: DC | PRN

## 2021-03-16 MED ORDER — ZINC SULFATE 220 (50 ZN) MG PO CAPS
220.0000 mg | ORAL_CAPSULE | Freq: Every day | ORAL | Status: DC
  Administered 2021-03-16 – 2021-03-22 (×7): 220 mg via ORAL
  Filled 2021-03-16 (×7): qty 1

## 2021-03-16 MED ORDER — HYDROMORPHONE HCL 1 MG/ML IJ SOLN: 0.5000 mg | INTRAMUSCULAR | Status: DC | PRN

## 2021-03-16 MED ORDER — ONDANSETRON HCL 4 MG/2ML IJ SOLN
INTRAMUSCULAR | Status: DC | PRN
  Administered 2021-03-16 (×2): 4 mg via INTRAVENOUS

## 2021-03-16 MED ORDER — CHLORHEXIDINE GLUCONATE 0.12 % MT SOLN
OROMUCOSAL | Status: AC
  Administered 2021-03-16: 15 mL via OROMUCOSAL
  Filled 2021-03-16: qty 15

## 2021-03-16 MED ORDER — PHENOL 1.4 % MT LIQD: 1.0000 | OROMUCOSAL | Status: DC | PRN

## 2021-03-16 MED ORDER — BUPIVACAINE HCL (PF) 0.5 % IJ SOLN
INTRAMUSCULAR | Status: DC | PRN
  Administered 2021-03-16 (×2): 15 mL via PERINEURAL

## 2021-03-16 MED ORDER — METFORMIN HCL 500 MG PO TABS
500.0000 mg | ORAL_TABLET | Freq: Every day | ORAL | Status: DC
  Administered 2021-03-19 – 2021-03-22 (×4): 500 mg via ORAL
  Filled 2021-03-16 (×5): qty 1

## 2021-03-16 MED ORDER — LIDOCAINE 2% (20 MG/ML) 5 ML SYRINGE
INTRAMUSCULAR | Status: DC | PRN
  Administered 2021-03-16: 60 mg via INTRAVENOUS

## 2021-03-16 MED ORDER — CEFAZOLIN SODIUM-DEXTROSE 2-4 GM/100ML-% IV SOLN
2.0000 g | Freq: Three times a day (TID) | INTRAVENOUS | Status: AC
  Administered 2021-03-16: 2 g via INTRAVENOUS
  Filled 2021-03-16 (×3): qty 100

## 2021-03-16 MED ORDER — MIDAZOLAM HCL 2 MG/2ML IJ SOLN: 2.0000 mg | Freq: Once | INTRAMUSCULAR | Status: AC

## 2021-03-16 MED ORDER — CHLORHEXIDINE GLUCONATE 0.12 % MT SOLN: 15.0000 mL | Freq: Once | OROMUCOSAL | Status: AC

## 2021-03-16 MED ORDER — CEFAZOLIN SODIUM-DEXTROSE 2-4 GM/100ML-% IV SOLN
INTRAVENOUS | Status: AC
  Filled 2021-03-16: qty 100

## 2021-03-16 MED ORDER — FENTANYL CITRATE (PF) 100 MCG/2ML IJ SOLN: 100.0000 ug | Freq: Once | INTRAMUSCULAR | Status: AC

## 2021-03-16 MED ORDER — MIDAZOLAM HCL 2 MG/2ML IJ SOLN
INTRAMUSCULAR | Status: AC
  Administered 2021-03-16: 2 mg via INTRAVENOUS
  Filled 2021-03-16: qty 2

## 2021-03-16 MED ORDER — INSULIN ASPART 100 UNIT/ML IJ SOLN
0.0000 [IU] | Freq: Three times a day (TID) | INTRAMUSCULAR | Status: DC
  Administered 2021-03-16: 3 [IU] via SUBCUTANEOUS
  Administered 2021-03-17 (×3): 2 [IU] via SUBCUTANEOUS
  Administered 2021-03-18: 1 [IU] via SUBCUTANEOUS
  Administered 2021-03-19 – 2021-03-20 (×2): 2 [IU] via SUBCUTANEOUS
  Administered 2021-03-22: 1 [IU] via SUBCUTANEOUS

## 2021-03-16 MED ORDER — TRANEXAMIC ACID-NACL 1000-0.7 MG/100ML-% IV SOLN: 1000.0000 mg | Freq: Once | INTRAVENOUS | Status: DC

## 2021-03-16 MED ORDER — ORAL CARE MOUTH RINSE: 15.0000 mL | Freq: Once | OROMUCOSAL | Status: AC

## 2021-03-16 MED ORDER — PROPOFOL 10 MG/ML IV BOLUS
INTRAVENOUS | Status: DC | PRN
  Administered 2021-03-16: 150 mg via INTRAVENOUS
  Administered 2021-03-16: 50 mg via INTRAVENOUS

## 2021-03-16 MED ORDER — CLOPIDOGREL BISULFATE 75 MG PO TABS
75.0000 mg | ORAL_TABLET | Freq: Every day | ORAL | Status: DC
  Administered 2021-03-16 – 2021-03-22 (×7): 75 mg via ORAL
  Filled 2021-03-16 (×7): qty 1

## 2021-03-16 MED ORDER — BISACODYL 5 MG PO TBEC: 5.0000 mg | DELAYED_RELEASE_TABLET | Freq: Every day | ORAL | Status: DC | PRN

## 2021-03-16 MED ORDER — ROSUVASTATIN CALCIUM 5 MG PO TABS
10.0000 mg | ORAL_TABLET | Freq: Every morning | ORAL | Status: DC
  Administered 2021-03-16 – 2021-03-22 (×7): 10 mg via ORAL
  Filled 2021-03-16 (×7): qty 2

## 2021-03-16 MED ORDER — LISINOPRIL 10 MG PO TABS
10.0000 mg | ORAL_TABLET | Freq: Every morning | ORAL | Status: DC
  Administered 2021-03-16 – 2021-03-22 (×7): 10 mg via ORAL
  Filled 2021-03-16 (×8): qty 1

## 2021-03-16 MED ORDER — BUPIVACAINE LIPOSOME 1.3 % IJ SUSP
INTRAMUSCULAR | Status: DC | PRN
  Administered 2021-03-16 (×2): 10 mL

## 2021-03-16 MED ORDER — HYDROMORPHONE HCL 1 MG/ML IJ SOLN: 0.2500 mg | INTRAMUSCULAR | Status: DC | PRN

## 2021-03-16 MED ORDER — POLYETHYLENE GLYCOL 3350 17 G PO PACK: 17.0000 g | PACK | Freq: Every day | ORAL | Status: DC | PRN

## 2021-03-16 MED ORDER — MIDAZOLAM HCL 2 MG/2ML IJ SOLN
INTRAMUSCULAR | Status: DC | PRN
  Administered 2021-03-16: 2 mg via INTRAVENOUS

## 2021-03-16 MED ORDER — MIDAZOLAM HCL 2 MG/2ML IJ SOLN
INTRAMUSCULAR | Status: AC
  Filled 2021-03-16: qty 2

## 2021-03-16 MED ORDER — EPHEDRINE SULFATE-NACL 50-0.9 MG/10ML-% IV SOSY
PREFILLED_SYRINGE | INTRAVENOUS | Status: DC | PRN
  Administered 2021-03-16 (×2): 10 mg via INTRAVENOUS

## 2021-03-16 MED ORDER — FENTANYL CITRATE (PF) 100 MCG/2ML IJ SOLN: 50.0000 ug | Freq: Once | INTRAMUSCULAR | Status: DC

## 2021-03-16 MED ORDER — 0.9 % SODIUM CHLORIDE (POUR BTL) OPTIME
TOPICAL | Status: DC | PRN
  Administered 2021-03-16: 1000 mL

## 2021-03-16 MED ORDER — ALUM & MAG HYDROXIDE-SIMETH 200-200-20 MG/5ML PO SUSP: 15.0000 mL | ORAL | Status: DC | PRN

## 2021-03-16 MED ORDER — FENTANYL CITRATE (PF) 250 MCG/5ML IJ SOLN
INTRAMUSCULAR | Status: DC | PRN
  Administered 2021-03-16: 50 ug via INTRAVENOUS
  Administered 2021-03-16: 100 ug via INTRAVENOUS
  Administered 2021-03-16 (×2): 50 ug via INTRAVENOUS

## 2021-03-16 MED ORDER — ONDANSETRON HCL 4 MG/2ML IJ SOLN: 4.0000 mg | Freq: Once | INTRAMUSCULAR | Status: DC | PRN

## 2021-03-16 MED ORDER — ASPIRIN EC 81 MG PO TBEC
81.0000 mg | DELAYED_RELEASE_TABLET | Freq: Every day | ORAL | Status: DC
  Administered 2021-03-16 – 2021-03-22 (×7): 81 mg via ORAL
  Filled 2021-03-16 (×7): qty 1

## 2021-03-16 MED ORDER — ONDANSETRON HCL 4 MG/2ML IJ SOLN: 4.0000 mg | Freq: Four times a day (QID) | INTRAMUSCULAR | Status: DC | PRN

## 2021-03-16 MED ORDER — DAPAGLIFLOZIN PROPANEDIOL 5 MG PO TABS
5.0000 mg | ORAL_TABLET | Freq: Every day | ORAL | Status: DC
  Administered 2021-03-17 – 2021-03-22 (×6): 5 mg via ORAL
  Filled 2021-03-16 (×6): qty 1

## 2021-03-16 MED ORDER — AMISULPRIDE (ANTIEMETIC) 5 MG/2ML IV SOLN: 10.0000 mg | Freq: Once | INTRAVENOUS | Status: DC | PRN

## 2021-03-16 MED ORDER — FENTANYL CITRATE (PF) 250 MCG/5ML IJ SOLN
INTRAMUSCULAR | Status: AC
  Filled 2021-03-16: qty 5

## 2021-03-16 MED ORDER — FENTANYL CITRATE (PF) 100 MCG/2ML IJ SOLN
INTRAMUSCULAR | Status: AC
  Administered 2021-03-16: 100 ug via INTRAVENOUS
  Filled 2021-03-16: qty 2

## 2021-03-16 MED ORDER — OXYCODONE HCL 5 MG/5ML PO SOLN: 5.0000 mg | Freq: Once | ORAL | Status: DC | PRN

## 2021-03-16 MED ORDER — ASCORBIC ACID 500 MG PO TABS
1000.0000 mg | ORAL_TABLET | Freq: Every day | ORAL | Status: DC
  Administered 2021-03-16 – 2021-03-22 (×7): 1000 mg via ORAL
  Filled 2021-03-16 (×7): qty 2

## 2021-03-16 MED ORDER — PANTOPRAZOLE SODIUM 40 MG PO TBEC
40.0000 mg | DELAYED_RELEASE_TABLET | Freq: Every day | ORAL | Status: DC
  Administered 2021-03-16 – 2021-03-22 (×7): 40 mg via ORAL
  Filled 2021-03-16 (×6): qty 1
  Filled 2021-03-16: qty 2

## 2021-03-16 MED ORDER — DOCUSATE SODIUM 100 MG PO CAPS
100.0000 mg | ORAL_CAPSULE | Freq: Every day | ORAL | Status: DC
  Administered 2021-03-17 – 2021-03-22 (×6): 100 mg via ORAL
  Filled 2021-03-16 (×6): qty 1

## 2021-03-16 MED ORDER — SODIUM CHLORIDE 0.9 % IV SOLN: INTRAVENOUS | Status: DC

## 2021-03-16 MED ORDER — OXYCODONE HCL 5 MG PO TABS: 5.0000 mg | ORAL_TABLET | Freq: Once | ORAL | Status: DC | PRN

## 2021-03-16 MED ORDER — CEFAZOLIN SODIUM-DEXTROSE 2-4 GM/100ML-% IV SOLN
2.0000 g | INTRAVENOUS | Status: AC
  Administered 2021-03-16: 2 g via INTRAVENOUS

## 2021-03-16 SURGICAL SUPPLY — 41 items
BAG COUNTER SPONGE SURGICOUNT (BAG) ×1 IMPLANT
BAG SPNG CNTER NS LX DISP (BAG) ×1
BLADE SAW RECIP 87.9 MT (BLADE) ×2 IMPLANT
BLADE SURG 21 STRL SS (BLADE) ×2 IMPLANT
BNDG COHESIVE 6X5 TAN STRL LF (GAUZE/BANDAGES/DRESSINGS) ×2 IMPLANT
CANISTER WOUND CARE 500ML ATS (WOUND CARE) IMPLANT
COVER SURGICAL LIGHT HANDLE (MISCELLANEOUS) ×2 IMPLANT
CUFF TOURN SGL QUICK 34 (TOURNIQUET CUFF)
CUFF TRNQT CYL 34X4.125X (TOURNIQUET CUFF) IMPLANT
DRAPE INCISE IOBAN 66X45 STRL (DRAPES) ×4 IMPLANT
DRAPE U-SHAPE 47X51 STRL (DRAPES) ×2 IMPLANT
DRESSING PREVENA PLUS CUSTOM (GAUZE/BANDAGES/DRESSINGS) ×1 IMPLANT
DRSG PREVENA PLUS CUSTOM (GAUZE/BANDAGES/DRESSINGS) ×2
DURAPREP 26ML APPLICATOR (WOUND CARE) ×2 IMPLANT
ELECT REM PT RETURN 9FT ADLT (ELECTROSURGICAL) ×2
ELECTRODE REM PT RTRN 9FT ADLT (ELECTROSURGICAL) ×1 IMPLANT
GLOVE SURG ORTHO LTX SZ9 (GLOVE) ×2 IMPLANT
GLOVE SURG POLYISO LF SZ6.5 (GLOVE) ×2 IMPLANT
GLOVE SURG UNDER POLY LF SZ7.5 (GLOVE) ×2 IMPLANT
GLOVE SURG UNDER POLY LF SZ9 (GLOVE) ×2 IMPLANT
GOWN STRL REUS W/ TWL LRG LVL3 (GOWN DISPOSABLE) ×1 IMPLANT
GOWN STRL REUS W/ TWL XL LVL3 (GOWN DISPOSABLE) ×2 IMPLANT
GOWN STRL REUS W/TWL LRG LVL3 (GOWN DISPOSABLE) ×2
GOWN STRL REUS W/TWL XL LVL3 (GOWN DISPOSABLE) ×4
KIT BASIN OR (CUSTOM PROCEDURE TRAY) ×2 IMPLANT
KIT TURNOVER KIT B (KITS) ×2 IMPLANT
MANIFOLD NEPTUNE II (INSTRUMENTS) ×2 IMPLANT
NS IRRIG 1000ML POUR BTL (IV SOLUTION) ×2 IMPLANT
PACK ORTHO EXTREMITY (CUSTOM PROCEDURE TRAY) ×2 IMPLANT
PAD ARMBOARD 7.5X6 YLW CONV (MISCELLANEOUS) ×2 IMPLANT
PREVENA RESTOR ARTHOFORM 46X30 (CANNISTER) ×2 IMPLANT
PREVENA RESTOR AXIOFORM 29X28 (GAUZE/BANDAGES/DRESSINGS) ×1 IMPLANT
STAPLER VISISTAT 35W (STAPLE) ×1 IMPLANT
STOCKINETTE IMPERVIOUS LG (DRAPES) ×1 IMPLANT
SUT ETHILON 2 0 PSLX (SUTURE) ×4 IMPLANT
SUT SILK 2 0 (SUTURE) ×2
SUT SILK 2-0 18XBRD TIE 12 (SUTURE) ×1 IMPLANT
TAPE STRIPS DRAPE STRL (GAUZE/BANDAGES/DRESSINGS) ×1 IMPLANT
TOWEL GREEN STERILE FF (TOWEL DISPOSABLE) ×2 IMPLANT
TUBE CONNECTING 20X1/4 (TUBING) ×2 IMPLANT
YANKAUER SUCT BULB TIP NO VENT (SUCTIONS) ×2 IMPLANT

## 2021-03-16 NOTE — Anesthesia Procedure Notes (Signed)
Anesthesia Regional Block: Femoral nerve block   Pre-Anesthetic Checklist: , timeout performed,  Correct Patient, Correct Site, Correct Laterality,  Correct Procedure, Correct Position, site marked,  Risks and benefits discussed,  Surgical consent,  Pre-op evaluation,  At surgeon's request and post-op pain management  Laterality: Right  Prep: chloraprep       Needles:  Injection technique: Single-shot  Needle Type: Echogenic Stimulator Needle     Needle Length: 10cm  Needle Gauge: 20     Additional Needles:   Procedures:,,,, ultrasound used (permanent image in chart),,    Narrative:  Start time: 03/16/2021 10:28 AM End time: 03/16/2021 10:33 AM Injection made incrementally with aspirations every 5 mL.  Performed by: Personally  Anesthesiologist: Lidia Collum, MD  Additional Notes: Standard monitors applied. Skin prepped. Good needle visualization with ultrasound. Injection made in 5cc increments with no resistance to injection. Patient tolerated the procedure well.

## 2021-03-16 NOTE — Anesthesia Procedure Notes (Signed)
Procedure Name: LMA Insertion Date/Time: 03/16/2021 12:49 PM Performed by: Reeves Dam, CRNA Pre-anesthesia Checklist: Patient identified, Patient being monitored, Timeout performed, Emergency Drugs available and Suction available Patient Re-evaluated:Patient Re-evaluated prior to induction Oxygen Delivery Method: Circle system utilized Preoxygenation: Pre-oxygenation with 100% oxygen Induction Type: IV induction Ventilation: Mask ventilation without difficulty LMA: LMA with gastric port inserted LMA Size: 5.0 Tube type: Oral Number of attempts: 1 Placement Confirmation: positive ETCO2 and breath sounds checked- equal and bilateral Tube secured with: Tape Dental Injury: Teeth and Oropharynx as per pre-operative assessment

## 2021-03-16 NOTE — Progress Notes (Signed)
Orthopedic Tech Progress Note Patient Details:  Stanley Chaney 05/31/1961 [id number] [place] Shrinker has been ordered from [facility]  Patient ID: Stanley Chaney, male   DOB: September 16, 1960, 59 y.o.   MRN: [id number]  Stanley Chaney 03/16/2021, 5:30 PM

## 2021-03-16 NOTE — Anesthesia Postprocedure Evaluation (Signed)
Anesthesia Post Note  Patient: Stanley Chaney  Procedure(s) Performed: RIGHT ABOVE KNEE AMPUTATION (Right: Knee)     Patient location during evaluation: PACU Anesthesia Type: General Level of consciousness: awake and alert Pain management: pain level controlled Vital Signs Assessment: post-procedure vital signs reviewed and stable Respiratory status: spontaneous breathing, nonlabored ventilation and respiratory function stable Cardiovascular status: blood pressure returned to baseline and stable Postop Assessment: no apparent nausea or vomiting Anesthetic complications: no   No notable events documented.  Last Vitals:  Vitals:   03/16/21 1403 03/16/21 1418  BP: (!) 153/66 (!) 159/67  Pulse: 74 74  Resp: 19 14  Temp:  (!) 36.2 C  SpO2: 100% 95%    Last Pain:  Vitals:   03/16/21 1418  PainSc: 0-No pain                 Lidia Collum

## 2021-03-16 NOTE — H&P (Signed)
Stanley Chaney is an 60 y.o. male.   Chief Complaint: Right Knee osteomyelitis HPI: Patient is a 60 year old gentleman who was seen for initial evaluation and referral from Dr. Marcelino Scot.  Patient has undergone extensive surgical and IV antibiotic treatment for a septic right knee he now has chronic bony changes consistent with osteomyelitis of the distal femur and proximal tibia.  Most recent MRI scan was performed in January of this year.  Patient has undergone arthroscopic debridement and has received over 2-1/2 months of IV and oral antibiotics.  Patient's hemoglobin A1c is approximately 9 he has an extremely elevated sed rate and C-reactive protein.  Patient uses crutches for ambulation.  Patient states he cannot actively bend his knee  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   Vitamin D deficiency 07/21/2020    Past Surgical History:  Procedure Laterality Date   ABDOMINAL AORTOGRAM W/LOWER EXTREMITY Bilateral 06/15/2018   Procedure: ABDOMINAL AORTOGRAM W/LOWER EXTREMITY;  Surgeon: [name], MD;  Location: [facility];  Service: Cardiovascular;  Laterality: Bilateral;   ABDOMINAL AORTOGRAM W/LOWER EXTREMITY Left 12/30/2018   Procedure: ABDOMINAL AORTOGRAM W/LOWER EXTREMITY;  Surgeon: [name], MD;  Location: [facility];  Service: Cardiovascular;  Laterality: Left;   ABDOMINAL AORTOGRAM W/LOWER EXTREMITY N/A 07/10/2020   Procedure: ABDOMINAL AORTOGRAM W/LOWER EXTREMITY;  Surgeon: [name], MD;  Location: [facility];  Service: Cardiovascular;  Laterality: N/A;  Bilateral   I & D EXTREMITY Left 06/12/2018   Procedure: Excisional Debridement left foot;  Surgeon: [name], MD;  Location: [facility];  Service: Orthopedics;  Laterality: Left;   IRRIGATION AND  DEBRIDEMENT KNEE Right 07/18/2020   Procedure: IRRIGATION AND DEBRIDEMENT RIGHT KNEE;  Surgeon: Altamese [place], MD;  Location: [facility];  Service: Orthopedics;  Laterality: Right;   KNEE ARTHROSCOPY Right 07/18/2020   Procedure: ARTHROSCOPY KNEE SYNOVECTOMY;  Surgeon: Altamese Cordova, MD;  Location: [facility];  Service: Orthopedics;  Laterality: Right;   LOWER EXTREMITY ANGIOGRAM Right 07/20/2018     Right lower extremity angiogram   LOWER EXTREMITY ANGIOGRAPHY Right 07/20/2018   Procedure: Lower Extremity Angiography;  Surgeon: [name], MD;  Location: [facility];  Service: Cardiovascular;  Laterality: Right;   PERIPHERAL VASCULAR ATHERECTOMY Left 06/15/2018   Procedure: PERIPHERAL VASCULAR ATHERECTOMY;  Surgeon: [name], MD;  Location: [facility];  Service: Cardiovascular;  Laterality: Left;  SFA   PERIPHERAL VASCULAR BALLOON ANGIOPLASTY Left 06/15/2018   Procedure: PERIPHERAL VASCULAR BALLOON ANGIOPLASTY;  Surgeon: [name], MD;  Location: [facility];  Service: Cardiovascular;  Laterality: Left;  SFA   PERIPHERAL VASCULAR BALLOON ANGIOPLASTY Right 07/20/2018   Procedure: PERIPHERAL VASCULAR BALLOON ANGIOPLASTY;  Surgeon: [name], MD;  Location: [facility];  Service: Cardiovascular;  Laterality: Right;  SFA WITH DRUG COATED    PERIPHERAL VASCULAR INTERVENTION Left 12/30/2018   Procedure: PERIPHERAL VASCULAR INTERVENTION;  Surgeon: [name], MD;  Location: [facility];  Service: Cardiovascular;  Laterality: Left;  SFA    Family History  Problem Relation Age of Onset   Diabetes Mother    Cancer Mother    Social History:  reports that he quit smoking about 2 years ago. His smoking use included cigarettes. He has never used smokeless tobacco. He reports previous  alcohol use of about 18.0 standard drinks of alcohol per week. He reports that he does not use drugs.  Allergies: No Known  Allergies  Medications Prior to Admission  Medication Sig Dispense Refill   acetaminophen (TYLENOL) 500 MG tablet TAKE 1 TABLET (500 MG TOTAL) BY MOUTH EVERY EIGHT HOURS AS NEEDED. (Patient taking differently: Take 500 mg by mouth every 8 (eight) hours as needed for moderate pain.) 60 tablet 0   aspirin 81 MG EC tablet Take 1 tablet (81 mg total) by mouth daily. 90 tablet 1   Cholecalciferol 125 MCG (5000 UT) TABS TAKE 1 TABLET BY MOUTH DAILY. (Patient taking differently: Take 5,000 Units by mouth daily.) 30 tablet 3   clopidogrel (PLAVIX) 75 MG tablet TAKE ONE TABLET BY MOUTH DAILY WITH BREAKFAST (Patient taking differently: Take 75 mg by mouth daily with breakfast.) 30 tablet 0   dapagliflozin propanediol (FARXIGA) 5 MG TABS tablet Take 1 tablet (5 mg total) by mouth daily before breakfast. 60 tablet 3   gabapentin (NEURONTIN) 300 MG capsule Take 1 capsule (300 mg total) by mouth at bedtime. 90 capsule 3   lisinopril (ZESTRIL) 10 MG tablet Take 1 tablet (10 mg total) by mouth daily. (Patient taking differently: Take 10 mg by mouth in the morning.) 90 tablet 3   metFORMIN (GLUCOPHAGE) 500 MG tablet Take 1 tablet (500 mg total) by mouth daily with breakfast. 180 tablet 3   rosuvastatin (CRESTOR) 10 MG tablet Take 1 tablet (10 mg total) by mouth daily. (Patient taking differently: Take 10 mg by mouth in the morning.) 90 tablet 3   blood glucose meter kit and supplies Dispense based on patient and insurance preference. Use up to four times daily as directed. (FOR ICD-10 E10.9, E11.9). 1 each 0    Results for orders placed or performed during the hospital encounter of 03/16/21 (from the past 48 hour(s))  Glucose, capillary     Status: Abnormal   Collection Time: 03/16/21  6:00 AM  Result Value Ref Range   Glucose-Capillary 158 (H) 70 - 99 mg/dL    Comment: Glucose reference range applies only to samples taken after fasting for at least 8 hours.   No results found.  Review of Systems  All other  systems reviewed and are negative.  Blood pressure (!) 204/66, pulse 60, resp. rate 18, height '5\' 10"'  (1.778 m), weight 65.8 kg, SpO2 99 %. Physical Exam   Patient is alert, oriented, no adenopathy, well-dressed, normal affect, normal respiratory effort. Examination patient's radiographs were reviewed which shows calcified vessels in the popliteal fossa.  Patient is status post vascular endarterectomy for revascularization with Dr. Donzetta Matters years ago.   Patient does not have palpable pulses at the ankle.  There is a tense effusion of the right knee review of the MRI scan shows edema in the distal femur and proximal tibia consistent with chronic osteomyelitis.  Patient is not taking medications for his diabetes.Heart RRR Lungs Clear Assessment/Plan  Plan: Discussed that we need to get him into his primary care physician Dr. Nyoka Cowden for diabetic management.  Patient states he currently does not take any medication for his diabetes and his A1c is greater than 9.  Have recommended that there are not any debridement options available and his best treatment option would be to proceed with an above-knee amputation on the right.  We will plan for surgery once patient's glucose is better managed to minimize risks postoperatively from surgery.  Bevely Palmer Janice Bodine, PA 03/16/2021, 6:32 AM

## 2021-03-16 NOTE — Op Note (Addendum)
03/16/2021  1:52 PM  PATIENT:  Stanley Chaney    PRE-OPERATIVE DIAGNOSIS:  Osteomyelitis and Septic Right Knee  POST-OPERATIVE DIAGNOSIS:  Same  PROCEDURE:  RIGHT ABOVE KNEE AMPUTATION Excision subfascial soft tissue tumor margins 10 x 8 cm. Local tissue rearrangement for closure of the tumor resection 10 x 8 cm. Application of Prevena wound VAC.  SURGEON:  [name], MD  PHYSICIAN ASSISTANT:None ANESTHESIA:   General  PREOPERATIVE INDICATIONS:  Stanley Chaney is a  60 y.o. male with a diagnosis of Osteomyelitis and Septic Right Knee who failed conservative measures and elected for surgical management.    The risks benefits and alternatives were discussed with the patient preoperatively including but not limited to the risks of infection, bleeding, nerve injury, cardiopulmonary complications, the need for revision surgery, among others, and the patient was willing to proceed.  OPERATIVE IMPLANTS: Worthy Keeler form and customizable wound VAC dressings  @ENCIMAGES @  OPERATIVE FINDINGS: Patient had a acute soft tissue mass lateral right thigh that was not present on his previous exam.  Patient has a family history of cancer and this was radically excised and sent to pathology.  This was a soft tissue mass approximately 6 cm in diameter tissue margins were 10 x 8 cm.  This did not have involvement with bone no clinical signs of this was involved with the osteomyelitis of the knee.  OPERATIVE PROCEDURE: Patient was brought the operating room and underwent a regional anesthetic.  After adequate levels anesthesia were obtained patient's right lower extremity was prepped using DuraPrep draped into a sterile field a timeout was called.  Attention was first focused on the acute subfascial soft tissue mass lateral right thigh.  A elliptical incision was made around the mass that was 10 x 8 cm for a mass that was 6 cm in diameter.  This extended below the fascia did not extend deep to  the muscles.  This was a solid soft tissue tumor.  The margins were grossly clear.  Attention was then focused on the amputation.  A fishmouth incision was made to incorporate the soft tissue mass excision with the fishmouth incision just proximal to the patella.  This was carried down extra-articular Lee to the femur.  The medial vascular bundle was suture ligated with 2-0 silk.  The amputation was completed with a reciprocating saw.  The wounds were irrigated with normal saline.  The amputation was closed with #1 Vicryl to close the deep and superficial fascial layers.  The skin was closed using approximate staples.  Local tissue rearrangement was used to close the wound 10 x 8 cm lateral thigh.  This was then closed with staples as well.  The Prevena customizable wound VAC was applied to cover all the incisions this was covered with the Arnell Sieving form dressing this was sealed with derma tack this had a good suction fit patient was taken the PACU in stable condition.   DISCHARGE PLANNING:  Antibiotic duration: 24 hours antibiotics  Weightbearing: Nonweightbearing on the right  Pain medication: Opioid pathway  Dressing care/ Wound VAC: Continue wound VAC for 1 week  Ambulatory devices: Walker  Discharge to: Discharge planning based on therapy recommendations.  Follow-up: In the office 1 week post operative.

## 2021-03-16 NOTE — Anesthesia Procedure Notes (Signed)
Anesthesia Regional Block: Sciatic   Pre-Anesthetic Checklist: , timeout performed,  Correct Patient, Correct Site, Correct Laterality,  Correct Procedure, Correct Position, site marked,  Risks and benefits discussed,  Surgical consent,  Pre-op evaluation,  At surgeon's request and post-op pain management  Laterality: Right  Prep: chloraprep       Needles:  Injection technique: Single-shot  Needle Type: Echogenic Stimulator Needle     Needle Length: 10cm  Needle Gauge: 20     Additional Needles:   Procedures:,,,, ultrasound used (permanent image in chart),,    Narrative:  Start time: 03/16/2021 10:25 AM End time: 03/16/2021 10:28 AM Injection made incrementally with aspirations every 5 mL.  Performed by: Personally  Anesthesiologist: Lidia Collum, MD  Additional Notes: Standard monitors applied. Skin prepped. Good needle visualization with ultrasound. Injection made in 5cc increments with no resistance to injection. Patient tolerated the procedure well.

## 2021-03-16 NOTE — Interval H&P Note (Signed)
History and Physical Interval Note:  03/16/2021 7:27 AM  Stanley Chaney  has presented today for surgery, with the diagnosis of Osteomyelitis and Septic Right Knee.  The various methods of treatment have been discussed with the patient and family. After consideration of risks, benefits and other options for treatment, the patient has consented to  Procedure(s): RIGHT ABOVE KNEE AMPUTATION (Right) as a surgical intervention.  The patient's history has been reviewed, patient examined, no change in status, stable for surgery.  I have reviewed the patient's chart and labs.  Questions were answered to the patient's satisfaction.     [name]

## 2021-03-16 NOTE — Transfer of Care (Signed)
Immediate Anesthesia Transfer of Care Note  Patient: Stanley Chaney  Procedure(s) Performed: RIGHT ABOVE KNEE AMPUTATION (Right: Knee)  Patient Location: PACU  Anesthesia Type:General  Level of Consciousness: awake, alert  and oriented  Airway & Oxygen Therapy: Patient Spontanous Breathing and Patient connected to face mask oxygen  Post-op Assessment: Report given to RN and Post -op Vital signs reviewed and stable  Post vital signs: Reviewed and stable  Last Vitals:  Vitals Value Taken Time  BP 155/70 03/16/21 1348  Temp    Pulse 79 03/16/21 1352  Resp 14 03/16/21 1352  SpO2 100 % 03/16/21 1352  Vitals shown include unvalidated device data.  Last Pain:  Vitals:   03/16/21 0643  PainSc: 0-No pain      Patients Stated Pain Goal: 3 (92/01/00 7121)  Complications: No notable events documented.

## 2021-03-17 ENCOUNTER — Encounter (HOSPITAL_COMMUNITY): Payer: Self-pay | Admitting: Orthopedic Surgery

## 2021-03-17 LAB — BASIC METABOLIC PANEL
Anion gap: 10 (ref 5–15)
BUN: 9 mg/dL (ref 8–23)
CO2: 25 mmol/L (ref 22–32)
Calcium: 8.7 mg/dL — ABNORMAL LOW (ref 8.9–10.3)
Chloride: 101 mmol/L (ref 98–111)
Creatinine, Ser: 0.94 mg/dL (ref 0.61–1.24)
GFR, Estimated: >60 mL/min (ref >60)
Glucose, Bld: 197 mg/dL — ABNORMAL HIGH (ref 70–99)
Potassium: 4 mmol/L (ref 3.5–5.1)
Sodium: 136 mmol/L (ref 135–145)

## 2021-03-17 LAB — CBC
HCT: 36.9 % — ABNORMAL LOW (ref 39.0–52.0)
Hemoglobin: 12.1 g/dL — ABNORMAL LOW (ref 13.0–17.0)
MCH: 26.7 pg (ref 26.0–34.0)
MCHC: 32.8 g/dL (ref 30.0–36.0)
MCV: 81.3 fL (ref 80.0–100.0)
Platelets: 274 10*3/uL (ref 150–400)
RBC: 4.54 MIL/uL (ref 4.22–5.81)
RDW: 13.3 % (ref 11.5–15.5)
WBC: 10 10*3/uL (ref 4.0–10.5)
nRBC: 0 % (ref 0.0–0.2)

## 2021-03-17 LAB — HEMOGLOBIN A1C
Hgb A1c MFr Bld: 8.6 % — ABNORMAL HIGH (ref 4.8–5.6)
Mean Plasma Glucose: 200.12 mg/dL

## 2021-03-17 LAB — GLUCOSE, CAPILLARY
Glucose-Capillary: 183 mg/dL — ABNORMAL HIGH (ref 70–99)
Glucose-Capillary: 169 mg/dL — ABNORMAL HIGH (ref 70–99)
Glucose-Capillary: 193 mg/dL — ABNORMAL HIGH (ref 70–99)
Glucose-Capillary: 172 mg/dL — ABNORMAL HIGH (ref 70–99)
Glucose-Capillary: 197 mg/dL — ABNORMAL HIGH (ref 70–99)

## 2021-03-17 NOTE — Progress Notes (Signed)
Patient ID: Stanley Chaney, male   DOB: 06/29/1961, 60 y.o.   MRN: [id number] Patient is postoperative day 1 right above-knee amputation for septic arthritis right knee.  Patient was evaluated with the [place] interpreter this morning.  Patient has no complaints.  If patient progresses well with physical therapy he can discharge to home when safe with therapy.  Of note patient did have a large soft tissue mass that was acute over the lateral thigh this was excised separately with wide margins and sent to pathology.  This was a large isolated soft tissue mass not purulent.

## 2021-03-17 NOTE — Evaluation (Signed)
Occupational Therapy Evaluation Patient Details Name: Stanley Chaney MRN: [id number] DOB: [birth year] Today's Date: 03/17/2021    History of Present Illness Pt is a 60 y.o. male admitted 7/8 due to osteomyelitis R knee. He underwent R AKA. PMH consists of PAD, DM   Clinical Impression   Pt admitted for procedure listed above. PTA pt reported that he was independent with mobility and requiring min A for LB ADL's. At this time, pt is now min A for functional mobility using a RW and maintaining NWB status on R AKA. In addition, pt requiring Mod A for all LB ADL's due to balance concerns. At this time pt would benefit from Intensive inpatient rehab to assist with returning to prior level of functioning and ensuring that pt can safely return to his 3rd floor apartment. Acute OT will continue to follow up to address concerns listed below.     Follow Up Recommendations  CIR    Equipment Recommendations  Other (comment) (TBD)    Recommendations for Other Services Rehab consult     Precautions / Restrictions Precautions Precautions: Fall Restrictions Weight Bearing Restrictions: Yes RLE Weight Bearing: Non weight bearing      Mobility Bed Mobility Overal bed mobility: Needs Assistance Bed Mobility: Supine to Sit     Supine to sit: Min assist;HOB elevated     General bed mobility comments: +rail, increased time    Transfers Overall transfer level: Needs assistance Equipment used: Rolling walker (2 wheeled) Transfers: Sit to/from [facility] Sit to Stand: +2 safety/equipment;Min assist Stand pivot transfers: +2 safety/equipment;Min assist       General transfer comment: assist to power up and stabilize balance. Pivot transfer bed to recliner toward L with RW    Balance Overall balance assessment: Needs assistance Sitting-balance support: No upper extremity supported;Feet supported Sitting balance-Leahy Scale: Good     Standing balance support: Bilateral  upper extremity supported;During functional activity Standing balance-Leahy Scale: Poor Standing balance comment: reliant on BUE and external support                           ADL either performed or assessed with clinical judgement   ADL Overall ADL's : Needs assistance/impaired Eating/Feeding: Independent;Sitting   Grooming: Set up;Sitting   Upper Body Bathing: Minimal assistance;Sitting   Lower Body Bathing: Moderate assistance;Maximal assistance;Sitting/lateral leans;Sit to/from stand   Upper Body Dressing : Set up;Sitting   Lower Body Dressing: Moderate assistance;Sitting/lateral leans;Sit to/from stand   Toilet Transfer: Minimal assistance;Stand-pivot   Toileting- Clothing Manipulation and Hygiene: Moderate assistance;Sitting/lateral lean;Sit to/from stand   Tub/ Shower Transfer: Minimal assistance;Stand-pivot   Functional mobility during ADLs: Minimal assistance;Rolling walker General ADL Comments: Pt tolerating mobility well and likely near baseline, however pt needing to relearn how to do ADL's now that his balance is off center.     Vision Baseline Vision/History: No visual deficits Patient Visual Report: No change from baseline Vision Assessment?: No apparent visual deficits     Perception Perception Perception Tested?: No   Praxis Praxis Praxis tested?: Not tested    Pertinent Vitals/Pain Pain Assessment: Faces Faces Pain Scale: Hurts little more Pain Location: R residual limb Pain Descriptors / Indicators: Grimacing;Guarding Pain Intervention(s): Limited activity within patient's tolerance;Monitored during session;Repositioned     Hand Dominance Right   Extremity/Trunk Assessment Upper Extremity Assessment Upper Extremity Assessment: Overall WFL for tasks assessed   Lower Extremity Assessment Lower Extremity Assessment: Defer to PT evaluation RLE Deficits / Details: s/p  AKA   Cervical / Trunk Assessment Cervical / Trunk Assessment:  Normal   Communication Communication Communication: Prefers language other than English (declined interpretor for eval)   Cognition Arousal/Alertness: Awake/alert Behavior During Therapy: WFL for tasks assessed/performed Overall Cognitive Status: Within Functional Limits for tasks assessed                                     General Comments  VSS on RA    Exercises     Shoulder Instructions      Home Living Family/patient expects to be discharged to:: Private residence Living Arrangements: Alone Available Help at Discharge: Family;Available PRN/intermittently Type of Home: Apartment Home Access: Stairs to enter Entrance Stairs-Number of Steps: 3 flights Entrance Stairs-Rails: Right;Left Home Layout: One level     Bathroom Shower/Tub: Teacher, early years/pre: Standard     Home Equipment: Environmental consultant - 2 wheels;Crutches          Prior Functioning/Environment Level of Independence: Needs assistance  Gait / Transfers Assistance Needed: RW in apartment and community, crutches to manage stairs, son assists with stairs ADL's / Homemaking Assistance Needed: son assists with bathing and LB ADL            OT Problem List: Decreased strength;Decreased activity tolerance;Impaired balance (sitting and/or standing);Decreased coordination;Decreased safety awareness;Decreased knowledge of use of DME or AE;Pain      OT Treatment/Interventions: Self-care/ADL training;Therapeutic exercise;Energy conservation;DME and/or AE instruction;Therapeutic activities;Patient/family education;Balance training    OT Goals(Current goals can be found in the care plan section) Acute Rehab OT Goals Patient Stated Goal: home OT Goal Formulation: With patient Time For Goal Achievement: 03/31/21 Potential to Achieve Goals: Good ADL Goals Pt Will Perform Grooming: with modified independence;standing Pt Will Perform Lower Body Bathing: with min guard assist;sit to/from  stand;sitting/lateral leans Pt Will Perform Lower Body Dressing: with min guard assist;sitting/lateral leans;sit to/from stand Pt Will Transfer to Toilet: with modified independence;stand pivot transfer Pt Will Perform Toileting - Clothing Manipulation and hygiene: with min guard assist;sitting/lateral leans;sit to/from stand  OT Frequency: Min 2X/week   Barriers to D/C:            Co-evaluation              AM-PAC OT "6 Clicks" Daily Activity     Outcome Measure Help from another person eating meals?: None Help from another person taking care of personal grooming?: A Little Help from another person toileting, which includes using toliet, bedpan, or urinal?: A Lot Help from another person bathing (including washing, rinsing, drying)?: A Lot Help from another person to put on and taking off regular upper body clothing?: A Little Help from another person to put on and taking off regular lower body clothing?: A Lot 6 Click Score: 16   End of Session Equipment Utilized During Treatment: Gait belt;Rolling walker Nurse Communication: Mobility status  Activity Tolerance: Patient tolerated treatment well Patient left: in chair;with call bell/phone within reach;with chair alarm set  OT Visit Diagnosis: Unsteadiness on feet (R26.81);Other abnormalities of gait and mobility (R26.89);Muscle weakness (generalized) (M62.81)                Time: 7824-2353 OT Time Calculation (min): 23 min Charges:  OT General Charges $OT Visit: 1 Visit OT Evaluation $OT Eval Moderate Complexity: 1 Mod OT Treatments $Therapeutic Activity: 8-22 mins  Leata Dominy H., OTR/L Acute Rehabilitation  Shakari Qazi Elane Caelynn Marshman 03/17/2021, 4:22 PM

## 2021-03-17 NOTE — Progress Notes (Signed)
Inpatient Diabetes Program Recommendations  AACE/ADA: New Consensus Statement on Inpatient Glycemic Control (2015)  Target Ranges:  Prepandial:   less than 140 mg/dL      Peak postprandial:   less than 180 mg/dL (1-2 hours)      Critically ill patients:  140 - 180 mg/dL   Lab Results  Component Value Date   GLUCAP 169 (H) 03/17/2021   HGBA1C 8.6 (H) 03/17/2021    Review of Glycemic Control Results for Stanley Chaney, Stanley Chaney (MRN [id number]) as of 03/17/2021 09:18  Ref. Range 03/16/2021 08:30 03/16/2021 10:58 03/16/2021 13:48 03/16/2021 18:19 03/16/2021 19:54 03/17/2021 06:35 03/17/2021 08:19  Glucose-Capillary Latest Ref Range: 70 - 99 mg/dL 158 (H) 151 (H) 162 (H) 203 (H) 178 (H) 183 (H) 169 (H)   Diabetes history: DM 2 Outpatient Diabetes medications: Farxiga 5 mg Daily, metformin 500 mg Daily Current orders for Inpatient glycemic control:  Farxiga 5 mg Daily, Metformin 500 mg Daily, Novolog 0-9 units tid  Consult placed for glycemic control   Inpatient Diabetes Program Recommendations:    Glucose trends 162-203 - Increase Novolog Correction to 0-15 units tid  Thanks,  Tama Headings RN, MSN, BC-ADM Inpatient Diabetes Coordinator Team Pager [phone] (8a-5p)

## 2021-03-17 NOTE — Evaluation (Signed)
Physical Therapy Evaluation Patient Details Name: Stanley Chaney MRN: [id number] DOB: 08/20/1961 Today's Date: 03/17/2021   History of Present Illness  Pt is a 60 y.o. male admitted 7/8 due to osteomyelitis R knee. He underwent R AKA. PMH consists of PAD, DM   Clinical Impression  Pt admitted with above diagnosis. PTA pt lived in 3rd floor apartment, utilizing RW vs crutches for ambulation. On eval, he required min assist bed mobility, min assist transfers, and min assist amb with RW 3'. +2 assist for safety/equipment.  Pt currently with functional limitations due to the deficits listed below (see PT Problem List). Pt will benefit from skilled PT to increase their independence and safety with mobility to allow discharge to the venue listed below.  Pt is highly motivated. He does have 3 flights of stairs to access his apartment.      Follow Up Recommendations CIR    Equipment Recommendations  None recommended by PT    Recommendations for Other Services Rehab consult     Precautions / Restrictions Precautions Precautions: Fall Restrictions RLE Weight Bearing: Non weight bearing      Mobility  Bed Mobility Overal bed mobility: Needs Assistance Bed Mobility: Supine to Sit     Supine to sit: Min assist;HOB elevated     General bed mobility comments: +rail, increased time    Transfers Overall transfer level: Needs assistance Equipment used: Rolling walker (2 wheeled) Transfers: Sit to/from [facility] Sit to Stand: +2 safety/equipment;Min assist Stand pivot transfers: +2 safety/equipment;Min assist       General transfer comment: assist to power up and stabilize balance. Pivot transfer bed to recliner toward L with RW  Ambulation/Gait Ambulation/Gait assistance: Min assist;+2 safety/equipment Gait Distance (Feet): 3 Feet Assistive device: Rolling walker (2 wheeled) Gait Pattern/deviations: Step-to pattern Gait velocity: decreased   General Gait  Details: 3-4 pivot steps with RW bed to recliner  Stairs            Wheelchair Mobility    Modified Rankin (Stroke Patients Only)       Balance Overall balance assessment: Needs assistance Sitting-balance support: No upper extremity supported;Feet supported Sitting balance-Leahy Scale: Good     Standing balance support: Bilateral upper extremity supported;During functional activity Standing balance-Leahy Scale: Poor Standing balance comment: reliant on BUE and external support                             Pertinent Vitals/Pain Pain Assessment: Faces Faces Pain Scale: Hurts little more Pain Location: R residual limb Pain Descriptors / Indicators: Grimacing;Guarding Pain Intervention(s): Monitored during session;Repositioned    Home Living Family/patient expects to be discharged to:: Private residence Living Arrangements: Alone Available Help at Discharge: Family;Available PRN/intermittently (son lives/works close by. Checks on frequently. Son always with pt when enter/exit apartment.) Type of Home: Apartment (3rd floor apartment) Home Access: Stairs to enter Entrance Stairs-Rails: Psychiatric nurse of Steps: 3 flights Home Layout: One level Home Equipment: Environmental consultant - 2 wheels;Crutches      Prior Function Level of Independence: Needs assistance   Gait / Transfers Assistance Needed: RW in apartment and community, crutches to manage stairs, son assists with stairs  ADL's / Homemaking Assistance Needed: son assists with bathing and LB ADL        Hand Dominance        Extremity/Trunk Assessment   Upper Extremity Assessment Upper Extremity Assessment: Overall WFL for tasks assessed    Lower Extremity Assessment  Lower Extremity Assessment: RLE deficits/detail RLE Deficits / Details: s/p AKA    Cervical / Trunk Assessment Cervical / Trunk Assessment: Normal  Communication   Communication: Prefers language other than English (Pt  speaks [place]. Declining use of interpreter during eval. Able to follow basic questions/commands during eval in [place].)  Cognition Arousal/Alertness: Awake/alert Behavior During Therapy: WFL for tasks assessed/performed Overall Cognitive Status: Within Functional Limits for tasks assessed                                        General Comments General comments (skin integrity, edema, etc.): VSS on RA    Exercises     Assessment/Plan    PT Assessment Patient needs continued PT services  PT Problem List Decreased mobility;Pain;Decreased balance;Decreased knowledge of use of DME;Decreased activity tolerance;Decreased knowledge of precautions       PT Treatment Interventions DME instruction;Therapeutic activities;Gait training;Therapeutic exercise;Patient/family education;Stair training;Balance training;Functional mobility training    PT Goals (Current goals can be found in the Care Plan section)  Acute Rehab PT Goals Patient Stated Goal: home PT Goal Formulation: With patient Time For Goal Achievement: 03/17/21 Potential to Achieve Goals: Good    Frequency Min 3X/week   Barriers to discharge Inaccessible home environment      Co-evaluation               AM-PAC PT "6 Clicks" Mobility  Outcome Measure Help needed turning from your back to your side while in a flat bed without using bedrails?: None Help needed moving from lying on your back to sitting on the side of a flat bed without using bedrails?: A Little Help needed moving to and from a bed to a chair (including a wheelchair)?: A Little Help needed standing up from a chair using your arms (e.g., wheelchair or bedside chair)?: A Little Help needed to walk in hospital room?: A Little Help needed climbing 3-5 steps with a railing? : A Lot 6 Click Score: 18    End of Session Equipment Utilized During Treatment: Gait belt Activity Tolerance: Patient tolerated treatment well Patient left: in  chair;with call bell/phone within reach;with chair alarm set Nurse Communication: Mobility status PT Visit Diagnosis: Unsteadiness on feet (R26.81);Difficulty in walking, not elsewhere classified (R26.2);Pain Pain - Right/Left: Right Pain - part of body: Leg    Time: 1414-1440 PT Time Calculation (min) (ACUTE ONLY): 26 min   Charges:   PT Evaluation $PT Eval Moderate Complexity: 1 Mod          Lorrin Goodell, PT  Office # [phone] Pager [phone]   Lorriane Shire 03/17/2021, 3:23 PM

## 2021-03-17 NOTE — Progress Notes (Signed)
Inpatient Rehab Admissions Coordinator:   CIR consult received. I await PT/OT notes with recommendations to determine appropriateness for CIR.   Clemens Catholic, [place], [place] Admissions Coordinator  [phone] ([place]) [phone] (office)

## 2021-03-18 LAB — CBC
HCT: 35.8 % — ABNORMAL LOW (ref 39.0–52.0)
Hemoglobin: 11.4 g/dL — ABNORMAL LOW (ref 13.0–17.0)
MCH: 26.6 pg (ref 26.0–34.0)
MCHC: 31.8 g/dL (ref 30.0–36.0)
MCV: 83.4 fL (ref 80.0–100.0)
Platelets: 270 K/uL (ref 150–400)
RBC: 4.29 MIL/uL (ref 4.22–5.81)
RDW: 13.4 % (ref 11.5–15.5)
WBC: 8.8 K/uL (ref 4.0–10.5)
nRBC: 0 % (ref 0.0–0.2)

## 2021-03-18 LAB — GLUCOSE, CAPILLARY
Glucose-Capillary: 116 mg/dL — ABNORMAL HIGH (ref 70–99)
Glucose-Capillary: 114 mg/dL — ABNORMAL HIGH (ref 70–99)
Glucose-Capillary: 146 mg/dL — ABNORMAL HIGH (ref 70–99)
Glucose-Capillary: 124 mg/dL — ABNORMAL HIGH (ref 70–99)

## 2021-03-18 LAB — BASIC METABOLIC PANEL
Anion gap: 13 (ref 5–15)
BUN: 14 mg/dL (ref 8–23)
CO2: 23 mmol/L (ref 22–32)
Calcium: 9.1 mg/dL (ref 8.9–10.3)
Chloride: 102 mmol/L (ref 98–111)
Creatinine, Ser: 0.92 mg/dL (ref 0.61–1.24)
GFR, Estimated: >60 mL/min (ref >60)
Glucose, Bld: 108 mg/dL — ABNORMAL HIGH (ref 70–99)
Potassium: 4.2 mmol/L (ref 3.5–5.1)
Sodium: 138 mmol/L (ref 135–145)

## 2021-03-18 NOTE — Progress Notes (Signed)
Physical Therapy Treatment Patient Details Name: Stanley Chaney MRN: [id number] DOB: September 06, 1961 Today's Date: 03/18/2021    History of Present Illness Pt is a 60 y.o. male admitted 7/8 due to osteomyelitis R knee. He underwent R AKA. PMH consists of PAD, DM    PT Comments    Pt motivated to progress mobility. Pt ambulatory in room with min guard to min assist overall for mobility, pt requiring cues for form and safety. PT emphasized the importance of RLE residual limb positioning in standing, cues for neutral hip positioning vs hip flexion. PT to continue to progress mobility as tolerated.     Follow Up Recommendations  CIR     Equipment Recommendations  None recommended by PT    Recommendations for Other Services Rehab consult     Precautions / Restrictions Precautions Precautions: Fall Precaution Comments: R AKA Restrictions Weight Bearing Restrictions: Yes RLE Weight Bearing: Non weight bearing    Mobility  Bed Mobility Overal bed mobility: Needs Assistance Bed Mobility: Supine to Sit     Supine to sit: Min assist     General bed mobility comments: min assist for trunk elevation, scooting to EOB. Increased time, use of bedrail.    Transfers Overall transfer level: Needs assistance Equipment used: Rolling walker (2 wheeled) Transfers: Sit to/from Stand Sit to Stand: Min assist         General transfer comment: Min assist to steady upon standing, VC for hand placement when rising/sitting. Posterior unsteadiness when transitioning stand>sit requiring light PT assist to correct.  Ambulation/Gait Ambulation/Gait assistance: Min guard Gait Distance (Feet): 40 Feet Assistive device: Rolling walker (2 wheeled) Gait Pattern/deviations: Step-to pattern;Trunk flexed Gait velocity: decr   General Gait Details: min guard for safey, verbal cuing for placement in RW, room navigation, keeping RLE in hip neutral as opposed to hip flexion.   Stairs              Wheelchair Mobility    Modified Rankin (Stroke Patients Only)       Balance Overall balance assessment: Needs assistance Sitting-balance support: No upper extremity supported;Feet supported Sitting balance-Leahy Scale: Good     Standing balance support: Bilateral upper extremity supported;During functional activity Standing balance-Leahy Scale: Poor Standing balance comment: reliant on BUE and external support                            Cognition Arousal/Alertness: Awake/alert Behavior During Therapy: WFL for tasks assessed/performed Overall Cognitive Status: Within Functional Limits for tasks assessed                                        Exercises Amputee Exercises Hip Extension: AROM;Right;10 reps;Seated (in recliner, x1 second hold at end range extension) Hip ABduction/ADduction: AROM;Right;10 reps;Seated    General Comments        Pertinent Vitals/Pain Pain Assessment: Faces Faces Pain Scale: Hurts little more Pain Location: R residual limb Pain Descriptors / Indicators: Grimacing;Guarding Pain Intervention(s): Limited activity within patient's tolerance;Monitored during session;Repositioned    Home Living                      Prior Function            PT Goals (current goals can now be found in the care plan section) Acute Rehab PT Goals Patient Stated Goal: home PT Goal Formulation:  With patient Time For Goal Achievement: 03/17/21 Potential to Achieve Goals: Good Progress towards PT goals: Progressing toward goals    Frequency    Min 3X/week      PT Plan Current plan remains appropriate    Co-evaluation              AM-PAC PT "6 Clicks" Mobility   Outcome Measure  Help needed turning from your back to your side while in a flat bed without using bedrails?: None Help needed moving from lying on your back to sitting on the side of a flat bed without using bedrails?: A Little Help needed moving  to and from a bed to a chair (including a wheelchair)?: A Little Help needed standing up from a chair using your arms (e.g., wheelchair or bedside chair)?: A Little Help needed to walk in hospital room?: A Little Help needed climbing 3-5 steps with a railing? : A Lot 6 Click Score: 18    End of Session   Activity Tolerance: Patient tolerated treatment well Patient left: in chair;with call bell/phone within reach;with chair alarm set Nurse Communication: Mobility status PT Visit Diagnosis: Unsteadiness on feet (R26.81);Difficulty in walking, not elsewhere classified (R26.2);Pain Pain - Right/Left: Right Pain - part of body: Leg     Time: 6979-4801 PT Time Calculation (min) (ACUTE ONLY): 28 min  Charges:  $Gait Training: 8-22 mins $Therapeutic Activity: 8-22 mins                     Stacie Glaze, PT DPT Acute Rehabilitation Services Pager [phone]  Office [phone]     [place] 03/18/2021, 2:28 PM

## 2021-03-18 NOTE — Progress Notes (Signed)
     Subjective: 2 Days Post-Op Procedure(s) (LRB): RIGHT ABOVE KNEE AMPUTATION (Right) Awake, alert and oriented x 4. VAC right AKA intact and drawing neg pressure.  Patient reports pain as mild.    Objective:   VITALS:  Temp:  [98.9 F (37.2 C)-99.6 F (37.6 C)] 98.9 F (37.2 C) (07/10 1133) Pulse Rate:  [68-78] 74 (07/10 1133) Resp:  [15-16] 16 (07/10 1133) BP: (132-144)/(55-69) 144/59 (07/10 1133) SpO2:  [94 %-97 %] 96 % (07/10 1133)  Neurologically intact ABD soft Incision: dressing C/D/I, no drainage, and VAC intact   LABS Recent Labs    03/16/21 0628 03/17/21 0145 03/18/21 0400  HGB 13.6 12.1* 11.4*  WBC 6.6 10.0 8.8  PLT 314 274 270   Recent Labs    03/17/21 0145 03/18/21 0400  NA 136 138  K 4.0 4.2  CL 101 102  CO2 25 23  BUN 9 14  CREATININE 0.94 0.92  GLUCOSE 197* 108*   No results for input(s): LABPT, INR in the last 72 hours.   Assessment/Plan: 2 Days Post-Op Procedure(s) (LRB): RIGHT ABOVE KNEE AMPUTATION (Right)  Advance diet Up with therapy Discharge to SNF, will need rehab for transfers and use of walker and w/c  Basil Dess 03/18/2021, 11:52 AM Patient ID: Stanley Chaney, male   DOB: [birth year], 60 y.o.   MRN: [id number]

## 2021-03-19 LAB — GLUCOSE, CAPILLARY
Glucose-Capillary: 89 mg/dL (ref 70–99)
Glucose-Capillary: 108 mg/dL — ABNORMAL HIGH (ref 70–99)
Glucose-Capillary: 183 mg/dL — ABNORMAL HIGH (ref 70–99)
Glucose-Capillary: 153 mg/dL — ABNORMAL HIGH (ref 70–99)

## 2021-03-19 NOTE — Progress Notes (Signed)
Patient is postop day 3 status post right above-knee amputation.  He is comfortable.  Wound VAC canister is working well without drainage.  Per physical therapy recommendations patient will be a good candidate for inpatient rehab hopefully discharge there as soon as bed available and insurance approval

## 2021-03-19 NOTE — Progress Notes (Signed)
Inpatient Rehab Admissions Coordinator:   I spoke with pt. Regarding potential CIR admit. He is very motivated and interested in CIR. Reports intermittent support from his son. I will open a case with his insurance today and pursue for potential admit pending insurance auth, bed availability, and medical readiness.  Clemens Catholic, [place], [place] Admissions Coordinator  [phone] ([place]) [phone] (office)

## 2021-03-19 NOTE — Plan of Care (Signed)
  Problem: Pain Managment: Goal: General experience of comfort will improve Outcome: Progressing   Problem: Safety: Goal: Ability to remain free from injury will improve Outcome: Progressing   

## 2021-03-20 LAB — GLUCOSE, CAPILLARY
Glucose-Capillary: 116 mg/dL — ABNORMAL HIGH (ref 70–99)
Glucose-Capillary: 157 mg/dL — ABNORMAL HIGH (ref 70–99)
Glucose-Capillary: 141 mg/dL — ABNORMAL HIGH (ref 70–99)
Glucose-Capillary: 149 mg/dL — ABNORMAL HIGH (ref 70–99)

## 2021-03-20 NOTE — Progress Notes (Signed)
Inpatient Rehab Admissions Coordinator:   I opened a case with pt.s insurer for CIR auth yesterday and have not yet received a response. Pt. Updated. I will pursue for potential admit pending bed availability and insurance auth.   Clemens Catholic, [place], [place] Admissions Coordinator  [phone] ([place]) [phone] (office)

## 2021-03-20 NOTE — Progress Notes (Signed)
Physical Therapy Treatment Patient Details Name: Stanley Chaney MRN: [id number] DOB: 16-Feb-1961 Today's Date: 03/20/2021    History of Present Illness Pt is a 59 y.o. male admitted 7/8 due to osteomyelitis R knee. He underwent R AKA. PMH consists of PAD, DM    PT Comments    Pt supine in bed on arrival.  Performed R LE ROM and strengthening.  Pt continues to benefit from aggressive rehab to improve strength and endurance before returning home.  Pt min guard to min assistance requiring increased assistance as he starts to fatigue.  Will continue to follow patient during acute stay.  Status IPAD interpreter used.  Natayla: [id number].  Pt however does understand english but cannot speak it back.    Follow Up Recommendations  CIR     Equipment Recommendations  None recommended by PT    Recommendations for Other Services Rehab consult     Precautions / Restrictions Precautions Precautions: Fall Precaution Comments: R AKA Restrictions Weight Bearing Restrictions: Yes RLE Weight Bearing: Non weight bearing    Mobility  Bed Mobility Overal bed mobility: Needs Assistance Bed Mobility: Supine to Sit     Supine to sit: Supervision     General bed mobility comments: Increased time to raise trunk as he did struggle with this but refused help and was able to chair.    Transfers Overall transfer level: Needs assistance Equipment used: Rolling walker (2 wheeled) Transfers: Sit to/from Stand Sit to Stand: Min guard         General transfer comment: Pt initally trying to pull on RW to achieve standing.  Pt required cues to push from bed to achieve standing.  Pt slow and guarded with very effortfull performance.  Ambulation/Gait Ambulation/Gait assistance: Min guard;Min assist Gait Distance (Feet): 40 Feet (+ 48ft + 40 ft + 45 ft.  Lengthy standing rest periods between trials.) Assistive device: Rolling walker (2 wheeled) Gait Pattern/deviations: Step-to pattern;Trunk  flexed Gait velocity: decr   General Gait Details: min guard for safey, verbal cuing for placement in RW, room navigation, keeping RLE in hip neutral as opposed to hip flexion.  Cues for postural awareness, turns and backing.   Stairs             Wheelchair Mobility    Modified Rankin (Stroke Patients Only)       Balance Overall balance assessment: Needs assistance Sitting-balance support: No upper extremity supported;Feet supported Sitting balance-Leahy Scale: Good       Standing balance-Leahy Scale: Poor Standing balance comment: reliant on BUE and external support                            Cognition Arousal/Alertness: Awake/alert Behavior During Therapy: WFL for tasks assessed/performed Overall Cognitive Status: Within Functional Limits for tasks assessed                                        Exercises Amputee Exercises Hip Extension: AROM;Right;Sidelying;15 reps Hip ABduction/ADduction: AROM;Right;Sidelying;15 reps Hip Flexion/Marching: AROM;Right;10 reps;15 reps;Supine    General Comments        Pertinent Vitals/Pain Pain Assessment: Faces Faces Pain Scale: Hurts little more Pain Location: R residual limb Pain Descriptors / Indicators: Grimacing;Guarding Pain Intervention(s): Monitored during session;Repositioned    Home Living  Prior Function            PT Goals (current goals can now be found in the care plan section) Acute Rehab PT Goals Patient Stated Goal: home Potential to Achieve Goals: Good Progress towards PT goals: Progressing toward goals    Frequency    Min 3X/week      PT Plan Current plan remains appropriate    Co-evaluation              AM-PAC PT "6 Clicks" Mobility   Outcome Measure  Help needed turning from your back to your side while in a flat bed without using bedrails?: None Help needed moving from lying on your back to sitting on the side of a  flat bed without using bedrails?: A Little Help needed moving to and from a bed to a chair (including a wheelchair)?: A Little Help needed standing up from a chair using your arms (e.g., wheelchair or bedside chair)?: A Little Help needed to walk in hospital room?: A Little Help needed climbing 3-5 steps with a railing? : A Little 6 Click Score: 19    End of Session Equipment Utilized During Treatment: Gait belt Activity Tolerance: Patient tolerated treatment well Patient left: in chair;with call bell/phone within reach;with chair alarm set Nurse Communication: Mobility status PT Visit Diagnosis: Unsteadiness on feet (R26.81);Difficulty in walking, not elsewhere classified (R26.2);Pain Pain - Right/Left: Right Pain - part of body: Leg     Time: 4818-5631 PT Time Calculation (min) (ACUTE ONLY): 25 min  Charges:  $Gait Training: 8-22 mins $Therapeutic Exercise: 8-22 mins                     Erasmo Leventhal , PTA Acute Rehabilitation Services Pager [phone] Office [phone]    Lillianna Sabel Eli Hose 03/20/2021, 11:38 AM

## 2021-03-20 NOTE — Progress Notes (Signed)
Patient is progressing well.  He plan for transfer to CIR.  Has no complaints today  Vital signs stable afebrile alert pleasant to exam canister is functioning with 1 green check.   Am awaiting pathology results on soft tissue mass of the thigh.  Spoke with pathology today and they are going to contact me once they read the preliminary slides

## 2021-03-20 NOTE — TOC Progression Note (Signed)
Transition of Care [facility]) - Progression Note    Patient Details  Name: Stanley Chaney MRN: [id number] Date of Birth: 08/03/1961  [facility] CM/SW Contact  Milinda Antis, [place] Phone Number: 03/20/2021, 3:08 PM  Clinical Narrative:     CSW following patient for any d/c planning needs once medically stable.  CIR currently following patient.  Lind Covert, MSW, LCSWA        Expected Discharge Plan and Services                                                 Social Determinants of Health (SDOH) Interventions    Readmission Risk Interventions Readmission Risk Prevention Plan 07/20/2020  Post Dischage Appt Complete  Medication Screening Complete  Transportation Screening Complete  Some recent data might be hidden

## 2021-03-21 LAB — GLUCOSE, CAPILLARY
Glucose-Capillary: 112 mg/dL — ABNORMAL HIGH (ref 70–99)
Glucose-Capillary: 119 mg/dL — ABNORMAL HIGH (ref 70–99)
Glucose-Capillary: 123 mg/dL — ABNORMAL HIGH (ref 70–99)

## 2021-03-21 LAB — SURGICAL PATHOLOGY

## 2021-03-21 NOTE — Progress Notes (Signed)
Inpatient Rehab Admissions Coordinator:   I received insurance auth for CIR today but do not have a bed for the pt. Will follow for potential admit pending bed availability.  Clemens Catholic, [place], [place] Admissions Coordinator  [phone] ([place]) [phone] (office)

## 2021-03-21 NOTE — Progress Notes (Signed)
Subjective: 5 Days Post-Op Procedure(s) (LRB): RIGHT ABOVE KNEE AMPUTATION (Right) Patient reports pain as mild.  No complaints this am.   Objective: Vital signs in last 24 hours: Temp:  [98.8 F (37.1 C)-99.4 F (37.4 C)] 98.8 F (37.1 C) (07/12 2056) Pulse Rate:  [72-75] 75 (07/12 2056) Resp:  [18-20] 18 (07/12 2056) BP: (132-145)/(61-67) 145/67 (07/12 2056) SpO2:  [96 %-98 %] 98 % (07/12 2056)  Intake/Output from previous day: 07/12 0701 - 07/13 0700 In: 720 [P.O.:720] Out: 1900 [Urine:1900] Intake/Output this shift: No intake/output data recorded.  No results for input(s): HGB in the last 72 hours. No results for input(s): WBC, RBC, HCT, PLT in the last 72 hours. No results for input(s): NA, K, CL, CO2, BUN, CREATININE, GLUCOSE, CALCIUM in the last 72 hours. No results for input(s): LABPT, INR in the last 72 hours.  Neurologically intact Wound vac in place and functioning properly without fluid in canister   Assessment/Plan: 5 Days Post-Op Procedure(s) (LRB): RIGHT ABOVE KNEE AMPUTATION (Right) Up with therapy NWB RLE Continue wound vac  Awaiting CIR placement       Aundra Dubin 03/21/2021, 8:11 AM

## 2021-03-21 NOTE — Progress Notes (Addendum)
Occupational Therapy Treatment Patient Details Name: Stanley Chaney MRN: [id number] DOB: August 15, 1961 Today's Date: 03/21/2021    History of present illness Pt is a 60 y.o. male admitted 7/8 due to osteomyelitis R knee. He underwent R AKA. PMH consists of PAD, DM   OT comments  Pt making progress with OT goals this session. He is very motivated to ambulate and get out of bed this session. Pt completed grooming standing at the sink with supervision for safety, and mobilized around the room to work on his activty tolerance. Pt is tolerating being on his LLE for longer periods of time without rest breaks. He continues to demonstrate need for intensive therapy services at CIR to maximize his independence and safety prior to returning home. Acute OT will continue to follow.    Follow Up Recommendations  CIR    Equipment Recommendations  Other (comment) (TBD)    Recommendations for Other Services Rehab consult    Precautions / Restrictions Precautions Precautions: Fall Precaution Comments: R AKA Restrictions Weight Bearing Restrictions: Yes RLE Weight Bearing: Non weight bearing       Mobility Bed Mobility Overal bed mobility: Modified Independent             General bed mobility comments: Pt able to complete sup to sit with no assist, HOB flat    Transfers Overall transfer level: Needs assistance Equipment used: Rolling walker (2 wheeled) Transfers: Sit to/from Stand Sit to Stand: Min guard         General transfer comment: Pt required min guard to steady as he powered up to standing    Balance Overall balance assessment: Mild deficits observed, not formally tested                                         ADL either performed or assessed with clinical judgement   ADL Overall ADL's : Needs assistance/impaired     Grooming: Wash/dry hands;Wash/dry face;Oral care;Supervision/safety;Standing Grooming Details (indicate cue type and reason):  completed at sink, sup for safety         Upper Body Dressing : Set up;Sitting Upper Body Dressing Details (indicate cue type and reason): donned back gown Lower Body Dressing: Min guard;Sitting/lateral leans Lower Body Dressing Details (indicate cue type and reason): adjusted sock Toilet Transfer: Min guard;Ambulation Toilet Transfer Details (indicate cue type and reason): Pt ambulated into the bathroom and back with min guard for safety and steadying         Functional mobility during ADLs: Min guard;Rolling walker General ADL Comments: Pt sup to min guard for safety with ADL's     Vision       Perception     Praxis      Cognition Arousal/Alertness: Awake/alert Behavior During Therapy: WFL for tasks assessed/performed Overall Cognitive Status: Within Functional Limits for tasks assessed                                          Exercises     Shoulder Instructions       General Comments VSS on RA    Pertinent Vitals/ Pain       Pain Assessment: No/denies pain  Home Living  Prior Functioning/Environment              Frequency  Min 2X/week        Progress Toward Goals  OT Goals(current goals can now be found in the care plan section)  Progress towards OT goals: Progressing toward goals  Acute Rehab OT Goals Patient Stated Goal: I want to walk more OT Goal Formulation: With patient Time For Goal Achievement: 03/31/21 Potential to Achieve Goals: Good ADL Goals Pt Will Perform Grooming: with modified independence;standing Pt Will Perform Lower Body Bathing: with min guard assist;sit to/from stand;sitting/lateral leans Pt Will Perform Lower Body Dressing: with min guard assist;sitting/lateral leans;sit to/from stand Pt Will Transfer to Toilet: with modified independence;stand pivot transfer Pt Will Perform Toileting - Clothing Manipulation and hygiene: with min guard  assist;sitting/lateral leans;sit to/from stand  Plan Discharge plan remains appropriate;Frequency remains appropriate    Co-evaluation                 AM-PAC OT "6 Clicks" Daily Activity     Outcome Measure   Help from another person eating meals?: None Help from another person taking care of personal grooming?: A Little Help from another person toileting, which includes using toliet, bedpan, or urinal?: A Little Help from another person bathing (including washing, rinsing, drying)?: A Lot Help from another person to put on and taking off regular upper body clothing?: A Little Help from another person to put on and taking off regular lower body clothing?: A Little 6 Click Score: 18    End of Session Equipment Utilized During Treatment: Gait belt;Rolling walker  OT Visit Diagnosis: Unsteadiness on feet (R26.81);Other abnormalities of gait and mobility (R26.89);Muscle weakness (generalized) (M62.81)   Activity Tolerance Patient tolerated treatment well   Patient Left in chair;with call bell/phone within reach;with chair alarm set   Nurse Communication Mobility status        Time: 8250-0370 OT Time Calculation (min): 20 min  Charges: OT General Charges $OT Visit: 1 Visit OT Treatments $Self Care/Home Management : 8-22 mins  Yanette Tripoli H., OTR/L Acute Rehabilitation   Vinessa Macconnell Elane Gunnard Dorrance 03/21/2021, 11:50 AM

## 2021-03-21 NOTE — Plan of Care (Signed)

## 2021-03-21 NOTE — PMR Pre-admission (Signed)
MR Admission Coordinator Pre-Admission Assessment  Patient: Stanley Chaney is an 60 y.o., male MRN: [id number] DOB: 1961-01-29 Height: 5\' 10"  (1.778 m) Weight: 65.8 kg  Insurance Information HMO:     PPO:      PCP:      IPA:      80/20:      OTHER:  PRIMARY: [facility]      Policy#: [id number]      Subscriber: Pt CM Name:       Phone#: [phone]     Fax#: Please verify fax  -  I received a fax from [facility] stating that Pt. Is approved 7/12-7/22 with updates due 8/78 Pre-Cert#: [id number]      Employer:  Benefits: Phone: [phone] Eff Date: 09/09/2020 - 04/08/2021 (premium paid to) Deductible: $8,700 ($0 met) OOP Max: $8,700 ($0 met) CIR: 100% coverage (AFTER DEDUCTUBLE MET) SNF: 100% coverage (AFTER DEDUCTUBLE MET) OP: 100% coverage (AFTER DEDUCTUBLE MET) HH: 100% coverage (AFTER DEDUCTUBLE MET) DME: 100% coverage (AFTER DEDUCTUBLE MET)  RENTAL ONLY  Providers: in network  SECONDARY:       Policy#:       Phone#:   [phone].  Thanks  Benefits: [place], Szymborski - DOB 09/24/1960  Insurance: [place], group #- [place]  Plan Type:  [id number]  Policy #: [id number]  Fax: [phone] (NEW FAX)  Financial Counselor:       Phone#:   The "Data Collection Information Summary" for patients in Inpatient Rehabilitation Facilities with attached "Privacy Act [place] Records" was provided and verbally reviewed with: Patient  Emergency Contact Information Contact Information     Name Relation Home Work [place], Jay Son [phone]  [phone]       Current Medical History  Patient Admitting Diagnosis: R AKA  History of Present Illness:   Stanley Chaney is a 60 year old male with history of PAD, T2DM, family history of soft tissue tumor, 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 Dr.  Sharol Given recommended AKA 2/22 for definitive treatment after diabetes was better controlled (patient with elevated A1C but stopped taking meds).  Once medically optimized, he was cleared to undergo R-AKA on 03/16/21. 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 Dr. Sharol Given plans to refer him to [facility] for follow up and  patient preferred to discuss this with his son.  Wound VAC remains in place. Therapy has been ongoing and patient limited by weakness and R-AKA. CIR recommended due to functional decline.    Patient's medical record from [facility] has been reviewed by the rehabilitation admission coordinator and physician.  Past Medical History  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   Vitamin D deficiency 07/21/2020    Family History   family history includes Cancer in his mother; Diabetes in his mother.  Prior Rehab/Hospitalizations Has the patient had prior rehab or hospitalizations prior to admission? Yes  Has the patient had major surgery during 100 days prior to admission? Yes   Current Medications  Current Facility-Administered Medications:    0.9 %  sodium chloride infusion, , Intravenous,  Continuous, Persons, Bevely Palmer, [place], Last Rate: 75 mL/hr at 03/17/21 2200, New Bag at 03/17/21 2200   alum & mag hydroxide-simeth (MAALOX/MYLANTA) 200-200-20 MG/5ML suspension 15-30 mL, 15-30 mL, Oral, Q2H PRN, Persons, Bevely Palmer, PA   ascorbic acid (VITAMIN C) tablet 1,000 mg, 1,000 mg, Oral, Daily, Persons, Bevely Palmer, PA, 1,000 mg at 03/21/21 0847   aspirin EC tablet 81 mg, 81 mg, Oral, Daily, Persons, Bevely Palmer, PA, 81 mg at 03/21/21 0848   bisacodyl (DULCOLAX) EC tablet 5 mg, 5 mg, Oral, Daily PRN, Persons, Bevely Palmer, PA   clopidogrel (PLAVIX) tablet 75 mg, 75 mg, Oral, Q breakfast, Persons, Bevely Palmer, PA, 75 mg at 03/21/21 0849   dapagliflozin propanediol (FARXIGA) tablet 5 mg, 5 mg, Oral, QAC breakfast, Persons, Bevely Palmer, PA, 5 mg at 03/21/21 0847   docusate sodium (COLACE) capsule 100 mg, 100 mg, Oral, Daily, Persons, Bevely Palmer, PA, 100 mg at 03/21/21 0945   gabapentin (NEURONTIN) capsule 300 mg, 300 mg, Oral, QHS, Persons, Bevely Palmer, PA, 300 mg at 03/20/21 2152   guaiFENesin-dextromethorphan (ROBITUSSIN DM) 100-10 MG/5ML syrup 15 mL, 15 mL, Oral, Q4H PRN, Persons, Bevely Palmer, PA   HYDROmorphone (DILAUDID) injection 0.5 mg, 0.5 mg, Intravenous, Q2H PRN, Jessy Oto, MD   insulin aspart (novoLOG) injection 0-9 Units, 0-9 Units, Subcutaneous, TID WC, Persons, Bevely Palmer, PA, 2 Units at 03/20/21 1145   lisinopril (ZESTRIL) tablet 10 mg, 10 mg, Oral, q AM, Persons, Bevely Palmer, PA, 10 mg at 03/21/21 0606   magnesium citrate solution 1 Bottle, 1 Bottle, Oral, Once PRN, Persons, Bevely Palmer, PA   metFORMIN (GLUCOPHAGE) tablet 500 mg, 500 mg, Oral, Q breakfast, Persons, Bevely Palmer, PA, 500 mg at 03/21/21 2778   nutrition supplement (JUVEN) (JUVEN) powder packet 1 packet, 1 packet, Oral, BID BM, Persons, Bevely Palmer, PA, 1 packet at 03/21/21 0945   ondansetron (ZOFRAN) injection 4 mg, 4 mg, Intravenous, Q6H PRN, Persons, Bevely Palmer, PA   oxyCODONE (Oxy IR/ROXICODONE) immediate release tablet 5-10 mg, 5-10 mg, Oral, Q4H PRN, Jessy Oto, MD   pantoprazole (PROTONIX) EC tablet 40 mg, 40 mg, Oral, Daily, Persons, Bevely Palmer, PA, 40 mg at 03/21/21 0848   phenol (CHLORASEPTIC) mouth spray 1 spray, 1 spray, Mouth/Throat, PRN, Persons, Bevely Palmer, PA   polyethylene glycol (MIRALAX / GLYCOLAX) packet 17 g, 17 g, Oral, Daily PRN, Persons, Bevely Palmer, PA   rosuvastatin (CRESTOR) tablet 10 mg, 10 mg, Oral, q AM, Persons, Bevely Palmer, [place], 10 mg at 03/21/21 0606   zinc sulfate capsule 220 mg, 220 mg, Oral, Daily, Persons, Bevely Palmer, PA, 220 mg at 03/21/21 0848 Patients Current Diet:  Diet Order             Diet - low sodium heart healthy           Diet Carb Modified Fluid consistency: Thin; Room service appropriate? Yes  Diet effective now                   Precautions / Restrictions Precautions Precautions: Fall Precaution Comments: R AKA Restrictions Weight Bearing Restrictions: Yes RLE Weight Bearing: Non weight bearing   Has the patient had 2 or more falls or a fall with injury in the past year? Yes  Prior Activity Level Limited Community (1-2x/wk): pt. went out for appointments  Prior Functional Level Self Care: Did the patient need help bathing, dressing, using the toilet or eating? Independent  Indoor Mobility: Did the patient need assistance  with walking from room to room (with or without device)? Independent  Stairs: Did the patient need assistance with internal or external stairs (with or without device)? Needed some help  Functional Cognition: Did the patient need help planning regular tasks such as shopping or remembering to take medications? [place] / Equipment Home Assistive Devices/Equipment: Environmental consultant (specify type), Crutches Home Equipment: Walker - 2 wheels, Crutches  Prior Device Use: Indicate devices/aids used by the patient prior to current illness, exacerbation or injury?  crutches  Current Functional Level Cognition  Overall Cognitive Status: Within Functional Limits for tasks assessed Orientation Level: Oriented X4    Extremity Assessment (includes Sensation/Coordination)  Upper Extremity Assessment: Overall WFL for tasks assessed  Lower Extremity Assessment: Defer to PT evaluation RLE Deficits / Details: s/p AKA    ADLs  Overall ADL's : Needs assistance/impaired Eating/Feeding: Independent, Sitting Grooming: Wash/dry hands, Wash/dry face, Oral care, Supervision/safety, Standing Grooming Details (indicate cue type and reason):  completed at sink, sup for safety Upper Body Bathing: Minimal assistance, Sitting Lower Body Bathing: Moderate assistance, Maximal assistance, Sitting/lateral leans, Sit to/from stand Upper Body Dressing : Set up, Sitting Upper Body Dressing Details (indicate cue type and reason): donned back gown Lower Body Dressing: Min guard, Sitting/lateral leans Lower Body Dressing Details (indicate cue type and reason): adjusted sock Toilet Transfer: Min guard, Ambulation Toilet Transfer Details (indicate cue type and reason): Pt ambulated into the bathroom and back with min guard for safety and steadying Toileting- Clothing Manipulation and Hygiene: Moderate assistance, Sitting/lateral lean, Sit to/from stand Tub/ Shower Transfer: Minimal assistance, Stand-pivot Functional mobility during ADLs: Min guard, Rolling walker General ADL Comments: Pt sup to min guard for safety with ADL's    Mobility  Overal bed mobility: Modified Independent Bed Mobility: Supine to Sit Supine to sit: Supervision General bed mobility comments: Pt able to complete sup to sit with no assist, HOB flat    Transfers  Overall transfer level: Needs assistance Equipment used: Rolling walker (2 wheeled) Transfers: Sit to/from Stand Sit to Stand: Min guard, Mod assist, From elevated surface (min guard from elevated surface and mod assistance from lower seated bench in hall.) Stand pivot transfers: +2 safety/equipment, Min assist General transfer comment: Pt required min guard to steady as he powered up to standing from elevated surface.  Heavy mod assistance to boost into standing from lower seated surface.    Ambulation / Gait / Stairs / Wheelchair Mobility  Ambulation/Gait Ambulation/Gait assistance: Min guard, Min assist (Increased assistance needed as he fatigued during second trial.) Gait Distance (Feet): 60 Feet (x2 with seated break between trials.  Second trial very effortful.) Assistive device: Rolling walker (2  wheeled) Gait Pattern/deviations: Step-to pattern, Trunk flexed General Gait Details: min guard for safey, verbal cuing for placement of RW, room navigation, keeping RLE in hip neutral as opposed to hip flexion.  Cues for postural awareness, turns and backing. Gait velocity: decr    Posture / Balance Balance Overall balance assessment: Mild deficits observed, not formally tested Sitting-balance support: No upper extremity supported, Feet supported Sitting balance-Leahy Scale: Good Standing balance support: Bilateral upper extremity supported, During functional activity Standing balance-Leahy Scale: Poor Standing balance comment: reliant on BUE and external support    Special needs/care consideration Has post op R AKA incision with VAC therapy; Is a diabetic and has been receiving insulin in acute hospital setting.   Previous Home Environment (from acute therapy documentation) Living Arrangements: Alone Available Help at Discharge: Family, Available PRN/intermittently  Type of Home: Apartment Home Layout: One level Home Access: Stairs to enter Entrance Stairs-Rails: Right, Left Entrance Stairs-Number of Steps: 3 flights Bathroom Shower/Tub: Chiropodist: [place]: No  Discharge Living Setting Plans for Discharge Living Setting: Patient's home Type of Home at Discharge: Apartment Discharge Home Layout: One level Discharge Home Access: Stairs to enter Entrance Stairs-Rails: Right, Left Entrance Stairs-Number of Steps: 3 flights Discharge Bathroom Shower/Tub: Tub/shower unit Discharge Bathroom Toilet: Standard Discharge Bathroom Accessibility: Yes How Accessible: Accessible via walker Does the patient have any problems obtaining your medications?: No  Social/Family/Support Systems Patient Roles: Other (Comment) Contact Information: [phone] Anticipated Caregiver: Charlies Constable (son) Anticipated Caregiver's Contact Information:  [phone] Ability/Limitations of Caregiver: Can provide intermittent min-mod A Caregiver Availability: Intermittent Discharge Plan Discussed with Primary Caregiver: Yes Is Caregiver In Agreement with Plan?: Yes Does Caregiver/Family have Issues with Lodging/Transportation while Pt is in Rehab?: No  Goals Patient/Family Goal for Rehab: PT/OT Mod I Expected length of stay: 5-7 days Pt/Family Agrees to Admission and willing to participate: Yes Program Orientation Provided & Reviewed with Pt/Caregiver Including Roles  & Responsibilities: Yes  Decrease burden of Care through IP rehab admission: Specialzed equipment needs, Decrease number of caregivers, Bowel and bladder program, and Patient/family education  Possible need for SNF placement upon discharge: not anticipated  Patient Condition: I have reviewed medical records from [facility] , spoken with CM, and patient and son. I met with patient at the bedside and discussed via phone for inpatient rehabilitation assessment.  Patient will benefit from ongoing PT and OT, can actively participate in 3 hours of therapy a day 5 days of the week, and can make measurable gains during the admission.  Patient will also benefit from the coordinated team approach during an Inpatient Acute Rehabilitation admission.  The patient will receive intensive therapy as well as Rehabilitation physician, nursing, social worker, and care management interventions.  Due to safety, skin/wound care, disease management, medication administration, pain management, and patient education the patient requires 24 hour a day rehabilitation nursing.  The patient is currently mostly minguard assist and up to mod assist with mobility and basic ADLs.  Discharge setting and therapy post discharge at home with home health is anticipated.  Patient has agreed to participate in the Acute Inpatient Rehabilitation Program and will admit today.  Preadmission Screen Completed  By:  Retta Diones, 03/22/2021 12:34 PM ______________________________________________________________________   Discussed status with Dr. Dagoberto Ligas on 03/22/21 at 0930 and received approval for admission today.  Admission Coordinator:  Retta Diones, RN, time 1236/Date 03/22/21   Assessment/Plan: Diagnosis: Does the need for close, 24 hr/day Medical supervision in concert with the patient's rehab needs make it unreasonable for this patient to be served in a less intensive setting? Yes Co-Morbidities requiring supervision/potential complications: Liposarcoma, R AKA, PAD, DM, VAC requirement Due to bowel management, safety, skin/wound care, disease management, medication administration, pain management, and patient education, does the patient require 24 hr/day rehab nursing? Yes Does the patient require coordinated care of a physician, rehab nurse, PT, OT, and SLP to address physical and functional deficits in the context of the above medical diagnosis(es)? Yes Addressing deficits in the following areas: balance, endurance, locomotion, strength, transferring, bathing, dressing, feeding, grooming, and toileting Can the patient actively participate in an intensive therapy program of at least 3 hrs of therapy 5 days a week? Yes The potential for patient to make measurable gains while on inpatient rehab is excellent Anticipated functional  outcomes upon discharge from inpatient rehab: modified independent and supervision PT, modified independent and supervision OT, n/a SLP Estimated rehab length of stay to reach the above functional goals is: 5-7 days Anticipated discharge destination: Home 10. Overall Rehab/Functional Prognosis: excellent   MD Signature:

## 2021-03-21 NOTE — Progress Notes (Signed)
Physical Therapy Treatment Patient Details Name: Stanley Chaney MRN: [id number] DOB: 1961-05-19 Today's Date: 03/21/2021    History of Present Illness Pt is a 60 y.o. male admitted 7/8 due to osteomyelitis R knee. He underwent R AKA. PMH consists of PAD, DM    PT Comments    Pt supine in bed this session.  He remains eager to participate in PT session.  Pt continues to benefit from aggressive rehab in a post acute setting.  He remains to be limited due to decreased strength and endurance.     Follow Up Recommendations  CIR     Equipment Recommendations  None recommended by PT    Recommendations for Other Services Rehab consult     Precautions / Restrictions Precautions Precautions: Fall Precaution Comments: R AKA Restrictions Weight Bearing Restrictions: Yes RLE Weight Bearing: Non weight bearing    Mobility  Bed Mobility Overal bed mobility: Modified Independent Bed Mobility: Supine to Sit           General bed mobility comments: Pt able to complete sup to sit with no assist, HOB flat    Transfers Overall transfer level: Needs assistance Equipment used: Rolling walker (2 wheeled) Transfers: Sit to/from Stand Sit to Stand: Min guard;Mod assist;From elevated surface (min guard from elevated surface and mod assistance from lower seated bench in hall.)         General transfer comment: Pt required min guard to steady as he powered up to standing from elevated surface.  Heavy mod assistance to boost into standing from lower seated surface.  Ambulation/Gait Ambulation/Gait assistance: Min guard;Min assist (Increased assistance needed as he fatigued during second trial.) Gait Distance (Feet): 60 Feet (x2 with seated break between trials.  Second trial very effortful.) Assistive device: Rolling walker (2 wheeled) Gait Pattern/deviations: Step-to pattern;Trunk flexed Gait velocity: decr   General Gait Details: min guard for safey, verbal cuing for placement of  RW, room navigation, keeping RLE in hip neutral as opposed to hip flexion.  Cues for postural awareness, turns and backing.   Stairs             Wheelchair Mobility    Modified Rankin (Stroke Patients Only)       Balance Overall balance assessment: Mild deficits observed, not formally tested   Sitting balance-Leahy Scale: Good       Standing balance-Leahy Scale: Poor                              Cognition Arousal/Alertness: Awake/alert Behavior During Therapy: WFL for tasks assessed/performed Overall Cognitive Status: Within Functional Limits for tasks assessed                                        Exercises      General Comments General comments (skin integrity, edema, etc.): VSS on RA      Pertinent Vitals/Pain Pain Assessment: Faces Faces Pain Scale: Hurts little more Pain Location: R residual limb Pain Descriptors / Indicators: Grimacing;Guarding Pain Intervention(s): Monitored during session;Repositioned    Home Living                      Prior Function            PT Goals (current goals can now be found in the care plan section) Acute Rehab PT Goals Patient Stated  Goal: to get home and see my grandkids Potential to Achieve Goals: Good Progress towards PT goals: Progressing toward goals    Frequency    Min 3X/week      PT Plan Current plan remains appropriate    Co-evaluation              AM-PAC PT "6 Clicks" Mobility   Outcome Measure  Help needed turning from your back to your side while in a flat bed without using bedrails?: None   Help needed moving to and from a bed to a chair (including a wheelchair)?: A Little Help needed standing up from a chair using your arms (e.g., wheelchair or bedside chair)?: A Little Help needed to walk in hospital room?: A Little Help needed climbing 3-5 steps with a railing? : A Little 6 Click Score: 16    End of Session Equipment Utilized During  Treatment: Gait belt Activity Tolerance: Patient tolerated treatment well Patient left: in chair;with call bell/phone within reach;with chair alarm set Nurse Communication: Mobility status PT Visit Diagnosis: Unsteadiness on feet (R26.81);Difficulty in walking, not elsewhere classified (R26.2);Pain Pain - Right/Left: Right Pain - part of body: Leg     Time: 7622-6333 PT Time Calculation (min) (ACUTE ONLY): 31 min  Charges:  $Gait Training: 8-22 mins $Therapeutic Activity: 8-22 mins                     Erasmo Leventhal , PTA Acute Rehabilitation Services Pager [phone] Office [phone]    [place] Zou Eli Hose 03/21/2021, 12:58 PM

## 2021-03-22 ENCOUNTER — Other Ambulatory Visit: Payer: Self-pay | Admitting: Physician Assistant

## 2021-03-22 ENCOUNTER — Other Ambulatory Visit: Payer: Self-pay

## 2021-03-22 ENCOUNTER — Telehealth: Payer: Self-pay

## 2021-03-22 ENCOUNTER — Inpatient Hospital Stay (HOSPITAL_COMMUNITY)
Admission: RE | Admit: 2021-03-22 | Discharge: 2021-03-29 | DRG: 560 | Disposition: A | Discharge status: Home Health | Payer: 59 | Source: Intra-hospital | Attending: Physical Medicine & Rehabilitation | Admitting: Physical Medicine & Rehabilitation

## 2021-03-22 ENCOUNTER — Encounter (HOSPITAL_COMMUNITY): Payer: Self-pay | Admitting: Physical Medicine & Rehabilitation

## 2021-03-22 DIAGNOSIS — Z7982 Long term (current) use of aspirin: Secondary | ICD-10-CM

## 2021-03-22 DIAGNOSIS — Z4781 Encounter for orthopedic aftercare following surgical amputation: Secondary | ICD-10-CM | POA: Diagnosis present

## 2021-03-22 DIAGNOSIS — I10 Essential (primary) hypertension: Secondary | ICD-10-CM | POA: Diagnosis present

## 2021-03-22 DIAGNOSIS — K5900 Constipation, unspecified: Secondary | ICD-10-CM | POA: Diagnosis present

## 2021-03-22 DIAGNOSIS — C4921 Malignant neoplasm of connective and soft tissue of right lower limb, including hip: Secondary | ICD-10-CM | POA: Diagnosis present

## 2021-03-22 DIAGNOSIS — E1159 Type 2 diabetes mellitus with other circulatory complications: Secondary | ICD-10-CM

## 2021-03-22 DIAGNOSIS — E440 Moderate protein-calorie malnutrition: Secondary | ICD-10-CM | POA: Diagnosis present

## 2021-03-22 DIAGNOSIS — Z833 Family history of diabetes mellitus: Secondary | ICD-10-CM

## 2021-03-22 DIAGNOSIS — Z89611 Acquired absence of right leg above knee: Secondary | ICD-10-CM

## 2021-03-22 DIAGNOSIS — E119 Type 2 diabetes mellitus without complications: Secondary | ICD-10-CM

## 2021-03-22 DIAGNOSIS — Z7902 Long term (current) use of antithrombotics/antiplatelets: Secondary | ICD-10-CM

## 2021-03-22 DIAGNOSIS — I739 Peripheral vascular disease, unspecified: Secondary | ICD-10-CM

## 2021-03-22 DIAGNOSIS — E1165 Type 2 diabetes mellitus with hyperglycemia: Secondary | ICD-10-CM | POA: Diagnosis present

## 2021-03-22 DIAGNOSIS — Z7984 Long term (current) use of oral hypoglycemic drugs: Secondary | ICD-10-CM

## 2021-03-22 DIAGNOSIS — S78119A Complete traumatic amputation at level between unspecified hip and knee, initial encounter: Secondary | ICD-10-CM

## 2021-03-22 DIAGNOSIS — N179 Acute kidney failure, unspecified: Secondary | ICD-10-CM | POA: Diagnosis present

## 2021-03-22 DIAGNOSIS — R7989 Other specified abnormal findings of blood chemistry: Secondary | ICD-10-CM | POA: Diagnosis not present

## 2021-03-22 DIAGNOSIS — C499 Malignant neoplasm of connective and soft tissue, unspecified: Secondary | ICD-10-CM

## 2021-03-22 DIAGNOSIS — Z87891 Personal history of nicotine dependence: Secondary | ICD-10-CM

## 2021-03-22 DIAGNOSIS — Z6820 Body mass index (BMI) 20.0-20.9, adult: Secondary | ICD-10-CM | POA: Diagnosis not present

## 2021-03-22 DIAGNOSIS — Z79899 Other long term (current) drug therapy: Secondary | ICD-10-CM

## 2021-03-22 DIAGNOSIS — D62 Acute posthemorrhagic anemia: Secondary | ICD-10-CM | POA: Diagnosis present

## 2021-03-22 DIAGNOSIS — E875 Hyperkalemia: Secondary | ICD-10-CM | POA: Diagnosis present

## 2021-03-22 DIAGNOSIS — E559 Vitamin D deficiency, unspecified: Secondary | ICD-10-CM | POA: Diagnosis present

## 2021-03-22 LAB — GLUCOSE, CAPILLARY
Glucose-Capillary: 120 mg/dL — ABNORMAL HIGH (ref 70–99)
Glucose-Capillary: 130 mg/dL — ABNORMAL HIGH (ref 70–99)
Glucose-Capillary: 115 mg/dL — ABNORMAL HIGH (ref 70–99)
Glucose-Capillary: 138 mg/dL — ABNORMAL HIGH (ref 70–99)

## 2021-03-22 MED ORDER — ZINC SULFATE 220 (50 ZN) MG PO CAPS: 220.0000 mg | ORAL_CAPSULE | Freq: Every day | ORAL | Status: DC

## 2021-03-22 MED ORDER — INSULIN ASPART 100 UNIT/ML IJ SOLN
0.0000 [IU] | Freq: Three times a day (TID) | INTRAMUSCULAR | Status: DC
  Administered 2021-03-23 (×3): 1 [IU] via SUBCUTANEOUS
  Administered 2021-03-24: 2 [IU] via SUBCUTANEOUS
  Administered 2021-03-24 (×2): 1 [IU] via SUBCUTANEOUS
  Administered 2021-03-25: 2 [IU] via SUBCUTANEOUS
  Administered 2021-03-25 (×2): 1 [IU] via SUBCUTANEOUS
  Administered 2021-03-26: 2 [IU] via SUBCUTANEOUS
  Administered 2021-03-26 – 2021-03-29 (×8): 1 [IU] via SUBCUTANEOUS

## 2021-03-22 MED ORDER — DIPHENHYDRAMINE HCL 12.5 MG/5ML PO ELIX: 12.5000 mg | ORAL_SOLUTION | Freq: Four times a day (QID) | ORAL | Status: DC | PRN

## 2021-03-22 MED ORDER — DAPAGLIFLOZIN PROPANEDIOL 5 MG PO TABS
5.0000 mg | ORAL_TABLET | Freq: Every day | ORAL | Status: DC
  Administered 2021-03-23 – 2021-03-29 (×7): 5 mg via ORAL
  Filled 2021-03-22 (×7): qty 1

## 2021-03-22 MED ORDER — BISACODYL 10 MG RE SUPP
10.0000 mg | Freq: Every day | RECTAL | Status: DC | PRN
Start: 2021-03-22 — End: 2021-03-29

## 2021-03-22 MED ORDER — INSULIN ASPART 100 UNIT/ML IJ SOLN: 0.0000 [IU] | Freq: Every day | INTRAMUSCULAR | Status: DC

## 2021-03-22 MED ORDER — SENNOSIDES-DOCUSATE SODIUM 8.6-50 MG PO TABS
1.0000 | ORAL_TABLET | Freq: Every day | ORAL | Status: DC
  Administered 2021-03-23 – 2021-03-27 (×3): 1 via ORAL
  Filled 2021-03-22 (×6): qty 1

## 2021-03-22 MED ORDER — FLEET ENEMA 7-19 GM/118ML RE ENEM: 1.0000 | ENEMA | Freq: Once | RECTAL | Status: DC | PRN

## 2021-03-22 MED ORDER — ACETAMINOPHEN 325 MG PO TABS: 325.0000 mg | ORAL_TABLET | ORAL | Status: DC | PRN

## 2021-03-22 MED ORDER — GUAIFENESIN-DM 100-10 MG/5ML PO SYRP: 5.0000 mL | ORAL_SOLUTION | Freq: Four times a day (QID) | ORAL | Status: DC | PRN

## 2021-03-22 MED ORDER — METHOCARBAMOL 500 MG PO TABS
500.0000 mg | ORAL_TABLET | Freq: Four times a day (QID) | ORAL | Status: DC | PRN
  Filled 2021-03-22: qty 1

## 2021-03-22 MED ORDER — GABAPENTIN 300 MG PO CAPS
300.0000 mg | ORAL_CAPSULE | Freq: Every day | ORAL | Status: DC
  Administered 2021-03-22 – 2021-03-28 (×7): 300 mg via ORAL
  Filled 2021-03-22 (×7): qty 1

## 2021-03-22 MED ORDER — DOCUSATE SODIUM 100 MG PO CAPS
100.0000 mg | ORAL_CAPSULE | Freq: Every day | ORAL | Status: DC
  Administered 2021-03-23 – 2021-03-29 (×5): 100 mg via ORAL
  Filled 2021-03-22 (×7): qty 1

## 2021-03-22 MED ORDER — PROCHLORPERAZINE 25 MG RE SUPP: 12.5000 mg | Freq: Four times a day (QID) | RECTAL | Status: DC | PRN

## 2021-03-22 MED ORDER — METFORMIN HCL 500 MG PO TABS
500.0000 mg | ORAL_TABLET | Freq: Every day | ORAL | Status: DC
  Administered 2021-03-23 – 2021-03-29 (×7): 500 mg via ORAL
  Filled 2021-03-22 (×7): qty 1

## 2021-03-22 MED ORDER — LISINOPRIL 10 MG PO TABS
10.0000 mg | ORAL_TABLET | Freq: Every morning | ORAL | Status: DC
  Administered 2021-03-23 – 2021-03-29 (×7): 10 mg via ORAL
  Filled 2021-03-22 (×7): qty 1

## 2021-03-22 MED ORDER — CLOPIDOGREL BISULFATE 75 MG PO TABS
75.0000 mg | ORAL_TABLET | Freq: Every day | ORAL | Status: DC
  Administered 2021-03-23 – 2021-03-29 (×7): 75 mg via ORAL
  Filled 2021-03-22 (×7): qty 1

## 2021-03-22 MED ORDER — ASPIRIN EC 81 MG PO TBEC
81.0000 mg | DELAYED_RELEASE_TABLET | Freq: Every day | ORAL | Status: DC
  Administered 2021-03-23 – 2021-03-29 (×7): 81 mg via ORAL
  Filled 2021-03-22 (×7): qty 1

## 2021-03-22 MED ORDER — PANTOPRAZOLE SODIUM 40 MG PO TBEC
40.0000 mg | DELAYED_RELEASE_TABLET | Freq: Every day | ORAL | Status: DC
Start: 2021-03-23 — End: 2021-03-29
  Administered 2021-03-23 – 2021-03-29 (×7): 40 mg via ORAL
  Filled 2021-03-22 (×7): qty 1

## 2021-03-22 MED ORDER — ENOXAPARIN SODIUM 40 MG/0.4ML IJ SOSY
40.0000 mg | PREFILLED_SYRINGE | INTRAMUSCULAR | Status: DC
  Administered 2021-03-22 – 2021-03-28 (×7): 40 mg via SUBCUTANEOUS
  Filled 2021-03-22 (×7): qty 0.4

## 2021-03-22 MED ORDER — ALUM & MAG HYDROXIDE-SIMETH 200-200-20 MG/5ML PO SUSP: 30.0000 mL | ORAL | Status: DC | PRN

## 2021-03-22 MED ORDER — OXYCODONE HCL 5 MG PO TABS
5.0000 mg | ORAL_TABLET | ORAL | Status: DC | PRN
  Administered 2021-03-27: 10 mg via ORAL
  Filled 2021-03-22: qty 2

## 2021-03-22 MED ORDER — CHLORHEXIDINE GLUCONATE CLOTH 2 % EX PADS: 6.0000 | MEDICATED_PAD | Freq: Every day | CUTANEOUS | Status: DC

## 2021-03-22 MED ORDER — PROCHLORPERAZINE EDISYLATE 10 MG/2ML IJ SOLN: 5.0000 mg | Freq: Four times a day (QID) | INTRAMUSCULAR | Status: DC | PRN

## 2021-03-22 MED ORDER — TRAZODONE HCL 50 MG PO TABS
25.0000 mg | ORAL_TABLET | Freq: Every evening | ORAL | Status: DC | PRN
  Filled 2021-03-22: qty 1

## 2021-03-22 MED ORDER — ROSUVASTATIN CALCIUM 5 MG PO TABS
10.0000 mg | ORAL_TABLET | Freq: Every morning | ORAL | Status: DC
  Administered 2021-03-23 – 2021-03-29 (×7): 10 mg via ORAL
  Filled 2021-03-22 (×7): qty 2

## 2021-03-22 MED ORDER — ASCORBIC ACID 1000 MG PO TABS: 1000.0000 mg | ORAL_TABLET | Freq: Every day | ORAL | Status: DC

## 2021-03-22 MED ORDER — ZINC SULFATE 220 (50 ZN) MG PO CAPS
220.0000 mg | ORAL_CAPSULE | Freq: Every day | ORAL | Status: AC
  Administered 2021-03-23 – 2021-03-29 (×7): 220 mg via ORAL
  Filled 2021-03-22 (×7): qty 1

## 2021-03-22 MED ORDER — POLYETHYLENE GLYCOL 3350 17 G PO PACK
17.0000 g | PACK | Freq: Every day | ORAL | Status: DC | PRN
Start: 2021-03-22 — End: 2021-03-29

## 2021-03-22 MED ORDER — PHENOL 1.4 % MT LIQD: 1.0000 | OROMUCOSAL | Status: DC | PRN

## 2021-03-22 MED ORDER — PROCHLORPERAZINE MALEATE 5 MG PO TABS: 5.0000 mg | ORAL_TABLET | Freq: Four times a day (QID) | ORAL | Status: DC | PRN

## 2021-03-22 MED ORDER — JUVEN PO PACK: 1.0000 | PACK | Freq: Two times a day (BID) | ORAL | 0 refills | Status: DC

## 2021-03-22 MED ORDER — ASCORBIC ACID 500 MG PO TABS
1000.0000 mg | ORAL_TABLET | Freq: Every day | ORAL | Status: DC
  Administered 2021-03-23 – 2021-03-29 (×7): 1000 mg via ORAL
  Filled 2021-03-22 (×7): qty 2

## 2021-03-22 MED ORDER — JUVEN PO PACK
1.0000 | PACK | Freq: Two times a day (BID) | ORAL | Status: DC
  Administered 2021-03-23 – 2021-03-29 (×12): 1 via ORAL
  Filled 2021-03-22 (×14): qty 1

## 2021-03-22 NOTE — Progress Notes (Addendum)
IP rehab admissions - I have approval from insurance for CIR.  I have a bed available and will plan to admit to CIR today.  I will contact attending MD to be sure patient is medically ready for CIR.  Call for questions.  [phone]  Patient has been cleared by attending team and will admit to CIR today.  [phone]

## 2021-03-22 NOTE — H&P (Signed)
Physical Medicine and Rehabilitation Admission H&P    CC: Functional deficits due to R AKA and liposarcoma of RLE.   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 Dr. Sharol Given recommended AKA 2/22 for definitive treatment after diabetes was better controlled (patient with elevated A1C but stopped taking meds).  Once medically optimized, he was cleared to undergo R-AKA on 03/16/21. 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 Dr. Sharol Given plans to refer him to [facility] for follow up and  patient preferred to discuss this with his son.  Wound VAC remains in place. Therapy has been ongoing and patient limited by weakness and R-AKA. CIR recommended due to functional decline.    Sleeping well; LBM this afternoon.  Pain is "doing OK"- with current pain regimen.  Lives on 3rd floor apartment, however son is turning his basement into a place for his father- but it's slow with contractors- son lives 10 minutes away.   Of note, liposarcoma- had since at least 9/21- however just in last few weeks, gotten MUCH bigger really fast- was initially very small and his PCP dx'd it as a lipoma.  Was biopsied during R AKA and removed with surgery.   Review of Systems  Constitutional:  Negative for chills and fever.  HENT:  Negative for hearing loss.   Eyes:  Negative for blurred vision and double vision.  Respiratory:  Negative for cough and shortness of breath.   Cardiovascular:  Negative for chest pain and leg swelling.  Gastrointestinal:  Positive for constipation. Negative for heartburn and nausea.  Genitourinary:  Negative for dysuria and urgency.  Musculoskeletal:  Positive  for back pain.  Skin:  Negative for rash.  Neurological:  Positive for weakness. Negative for dizziness and headaches.  Psychiatric/Behavioral:  The patient does not have insomnia.   All other systems reviewed and are negative.   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   Vitamin D deficiency 07/21/2020    Past Surgical History:  Procedure Laterality Date   ABDOMINAL AORTOGRAM W/LOWER EXTREMITY Bilateral 06/15/2018   Procedure: ABDOMINAL AORTOGRAM W/LOWER EXTREMITY;  Surgeon: [name], MD;  Location: [place] [facility];  Service: Cardiovascular;  Laterality: Bilateral;   ABDOMINAL AORTOGRAM W/LOWER EXTREMITY Left 12/30/2018   Procedure: ABDOMINAL AORTOGRAM W/LOWER EXTREMITY;  Surgeon: [name], MD;  Location: [place] [facility];  Service: Cardiovascular;  Laterality: Left;   ABDOMINAL AORTOGRAM W/LOWER EXTREMITY N/A 07/10/2020   Procedure: ABDOMINAL AORTOGRAM W/LOWER EXTREMITY;  Surgeon: [name], MD;  Location: [place] [facility];  Service: Cardiovascular;  Laterality: N/A;  Bilateral   AMPUTATION Right 03/16/2021   Procedure: RIGHT ABOVE KNEE AMPUTATION;  Surgeon: [name], MD;  Location: [place];  Service: Orthopedics;  Laterality: Right;   I & D EXTREMITY Left 06/12/2018   Procedure: Excisional Debridement left foot;  Surgeon: [name], MD;  Location: [facility];  Service: Orthopedics;  Laterality: Left;   IRRIGATION AND DEBRIDEMENT KNEE Right  07/18/2020   Procedure: IRRIGATION AND DEBRIDEMENT RIGHT KNEE;  Surgeon: Altamese [place], MD;  Location: [place];  Service: Orthopedics;  Laterality: Right;   KNEE ARTHROSCOPY Right 07/18/2020   Procedure: ARTHROSCOPY KNEE SYNOVECTOMY;  Surgeon: Altamese [place], MD;  Location: [place];  Service: Orthopedics;  Laterality: Right;   LOWER  EXTREMITY ANGIOGRAM Right 07/20/2018     Right lower extremity angiogram   LOWER EXTREMITY ANGIOGRAPHY Right 07/20/2018   Procedure: Lower Extremity Angiography;  Surgeon: [name], MD;  Location: [place] [facility];  Service: Cardiovascular;  Laterality: Right;   PERIPHERAL VASCULAR ATHERECTOMY Left 06/15/2018   Procedure: PERIPHERAL VASCULAR ATHERECTOMY;  Surgeon: [name], MD;  Location: [place] [facility];  Service: Cardiovascular;  Laterality: Left;  SFA   PERIPHERAL VASCULAR BALLOON ANGIOPLASTY Left 06/15/2018   Procedure: PERIPHERAL VASCULAR BALLOON ANGIOPLASTY;  Surgeon: [name], MD;  Location: [place] [facility];  Service: Cardiovascular;  Laterality: Left;  SFA   PERIPHERAL VASCULAR BALLOON ANGIOPLASTY Right 07/20/2018   Procedure: PERIPHERAL VASCULAR BALLOON ANGIOPLASTY;  Surgeon: [name], MD;  Location: [place] [facility];  Service: Cardiovascular;  Laterality: Right;  SFA WITH DRUG COATED    PERIPHERAL VASCULAR INTERVENTION Left 12/30/2018   Procedure: PERIPHERAL VASCULAR INTERVENTION;  Surgeon: [name], MD;  Location: [place] [facility];  Service: Cardiovascular;  Laterality: Left;  SFA    Family History  Problem Relation Age of Onset   Diabetes Mother    Cancer Mother     Social History:  Lives alone--moved to [place] last fall to be closer to son (check on him/provides meals). He used to walk 3 miles per day until knee issues last fall--has been sedentary since. He  reports that he quit smoking about 2 years ago. His smoking use included cigarettes. He has never used smokeless tobacco. He reports previous alcohol use of about 18.0 standard drinks of alcohol per week. He reports that he does not use drugs.   Allergies: No Known Allergies   Medications Prior to Admission  Medication Sig Dispense Refill   aspirin 81 MG EC tablet Take 1 tablet (81 mg total) by mouth daily. 90 tablet 1    Cholecalciferol 125 MCG (5000 UT) TABS TAKE 1 TABLET BY MOUTH DAILY. (Patient taking differently: Take 5,000 Units by mouth daily.) 30 tablet 3   clopidogrel (PLAVIX) 75 MG tablet TAKE ONE TABLET BY MOUTH DAILY WITH BREAKFAST (Patient taking differently: Take 75 mg by mouth daily with breakfast.) 30 tablet 0   dapagliflozin propanediol (FARXIGA) 5 MG TABS tablet Take 1 tablet (5 mg total) by mouth daily before breakfast. 60 tablet 3   gabapentin (NEURONTIN) 300 MG capsule Take 1 capsule (300 mg total) by mouth at bedtime. 90 capsule 3   lisinopril (ZESTRIL) 10 MG tablet Take 1 tablet (10 mg total) by mouth daily. (Patient taking differently: Take 10 mg by mouth in the morning.) 90 tablet 3   metFORMIN (GLUCOPHAGE) 500 MG tablet Take 1 tablet (500 mg total) by mouth daily with breakfast. 180 tablet 3   rosuvastatin (CRESTOR) 10 MG tablet Take 1 tablet (10 mg total) by mouth daily. (Patient taking differently: Take 10 mg by mouth in the morning.) 90 tablet 3   acetaminophen (TYLENOL) 500 MG tablet TAKE 1 TABLET (500 MG TOTAL) BY MOUTH EVERY EIGHT HOURS AS NEEDED. (Patient taking differently: Take 500 mg by mouth every 8 (eight) hours as needed for moderate pain.) 60 tablet 0   blood glucose  meter kit and supplies Dispense based on patient and insurance preference. Use up to four times daily as directed. (FOR ICD-10 E10.9, E11.9). 1 each 0    Drug Regimen Review  Drug regimen was reviewed and remains appropriate with no significant issues identified  Home: Home Living Family/patient expects to be discharged to:: Private residence Living Arrangements: Alone Available Help at Discharge: Family, Available PRN/intermittently Type of Home: Apartment Home Access: Stairs to enter [employer] of Steps: 3 flights Entrance Stairs-Rails: Right, Left Home Layout: One level Bathroom Shower/Tub: Chiropodist: Standard Home Equipment: Environmental consultant - 2 wheels, Crutches   Functional  History: Prior Function Level of Independence: Needs assistance Gait / Transfers Assistance Needed: RW in apartment and community, crutches to manage stairs, son assists with stairs ADL's / Homemaking Assistance Needed: son assists with bathing and LB ADL  Functional Status:  Mobility: Bed Mobility Overal bed mobility: Modified Independent Bed Mobility: Supine to Sit Supine to sit: Supervision General bed mobility comments: Pt able to complete sup to sit with no assist, HOB flat Transfers Overall transfer level: Needs assistance Equipment used: Rolling walker (2 wheeled) Transfers: Sit to/from Stand Sit to Stand: Min guard, Mod assist, From elevated surface (min guard from elevated surface and mod assistance from lower seated bench in hall.) Stand pivot transfers: +2 safety/equipment, Min assist General transfer comment: Pt required min guard to steady as he powered up to standing from elevated surface.  Heavy mod assistance to boost into standing from lower seated surface. Ambulation/Gait Ambulation/Gait assistance: Min guard, Min assist (Increased assistance needed as he fatigued during second trial.) Gait Distance (Feet): 60 Feet (x2 with seated break between trials.  Second trial very effortful.) Assistive device: Rolling walker (2 wheeled) Gait Pattern/deviations: Step-to pattern, Trunk flexed General Gait Details: min guard for safey, verbal cuing for placement of RW, room navigation, keeping RLE in hip neutral as opposed to hip flexion.  Cues for postural awareness, turns and backing. Gait velocity: decr    ADL: ADL Overall ADL's : Needs assistance/impaired Eating/Feeding: Independent, Sitting Grooming: Wash/dry hands, Wash/dry face, Oral care, Supervision/safety, Standing Grooming Details (indicate cue type and reason): completed at sink, sup for safety Upper Body Bathing: Minimal assistance, Sitting Lower Body Bathing: Moderate assistance, Maximal assistance,  Sitting/lateral leans, Sit to/from stand Upper Body Dressing : Set up, Sitting Upper Body Dressing Details (indicate cue type and reason): donned back gown Lower Body Dressing: Min guard, Sitting/lateral leans Lower Body Dressing Details (indicate cue type and reason): adjusted sock Toilet Transfer: Min guard, Ambulation Toilet Transfer Details (indicate cue type and reason): Pt ambulated into the bathroom and back with min guard for safety and steadying Toileting- Clothing Manipulation and Hygiene: Moderate assistance, Sitting/lateral lean, Sit to/from stand Tub/ Shower Transfer: Minimal assistance, Stand-pivot Functional mobility during ADLs: Min guard, Rolling walker General ADL Comments: Pt sup to min guard for safety with ADL's  Cognition: Cognition Overall Cognitive Status: Within Functional Limits for tasks assessed Orientation Level: Oriented X4 Cognition Arousal/Alertness: Awake/alert Behavior During Therapy: WFL for tasks assessed/performed Overall Cognitive Status: Within Functional Limits for tasks assessed   Blood pressure (!) 142/70, pulse 61, temperature 97.6 F (36.4 C), temperature source Oral, resp. rate 18, height _0  (1.778 m), weight 65.8 kg, SpO2 97 %. Physical Exam Vitals and nursing note reviewed. Exam conducted with a chaperone present.  Constitutional:      Appearance: Normal appearance. He is normal weight.     Comments: Awake, alert, appropriate, speaks some [place] and  [place]- son in room; on BSC washing up in bathroom, NAD  HENT:     Head: Normocephalic and atraumatic.     Right Ear: External ear normal.     Left Ear: External ear normal.     Nose: Nose normal. No congestion.     Mouth/Throat:     Mouth: Mucous membranes are moist.     Pharynx: Oropharynx is clear. No oropharyngeal exudate.  Eyes:     General:        Right eye: No discharge.        Left eye: No discharge.     Extraocular Movements: Extraocular movements intact.   Cardiovascular:     Rate and Rhythm: Normal rate and regular rhythm.     Heart sounds: Normal heart sounds. No murmur heard.   No gallop.  Pulmonary:     Comments: CTA B/L- no W/R/R- good air movement  Abdominal:     Comments: Soft, NT, ND, (+)BS    Musculoskeletal:     Cervical back: Normal range of motion and neck supple.     Comments: R AKA with wound VAC covered with shrinker Pt's UE strength is 5/5 B/L in biceps, triceps, WE, grip and FA RLE- 5/5 in HF LLE- 5/5 in HF, KE, KF, DF and PF    Skin:    Comments: Wound VAC on R AKA with VAC on floor No other skin breakdown seen  Neurological:     Mental Status: He is alert and oriented to person, place, and time.     Comments: Decreased to light touch on LLE below L knee Ox3  Psychiatric:        Mood and Affect: Mood normal.        Behavior: Behavior normal.        Thought Content: Thought content normal.        Judgment: Judgment normal.    Results for orders placed or performed during the hospital encounter of 03/16/21 (from the past 48 hour(s))  Glucose, capillary     Status: Abnormal   Collection Time: 03/20/21  3:28 PM  Result Value Ref Range   Glucose-Capillary 141 (H) 70 - 99 mg/dL    Comment: Glucose reference range applies only to samples taken after fasting for at least 8 hours.  Glucose, capillary     Status: Abnormal   Collection Time: 03/20/21  8:52 PM  Result Value Ref Range   Glucose-Capillary 149 (H) 70 - 99 mg/dL    Comment: Glucose reference range applies only to samples taken after fasting for at least 8 hours.  Glucose, capillary     Status: Abnormal   Collection Time: 03/21/21  6:40 AM  Result Value Ref Range   Glucose-Capillary 112 (H) 70 - 99 mg/dL    Comment: Glucose reference range applies only to samples taken after fasting for at least 8 hours.  Glucose, capillary     Status: Abnormal   Collection Time: 03/21/21 11:55 AM  Result Value Ref Range   Glucose-Capillary 119 (H) 70 - 99 mg/dL     Comment: Glucose reference range applies only to samples taken after fasting for at least 8 hours.  Glucose, capillary     Status: Abnormal   Collection Time: 03/21/21  4:06 PM  Result Value Ref Range   Glucose-Capillary 123 (H) 70 - 99 mg/dL    Comment: Glucose reference range applies only to samples taken after fasting for at least 8 hours.  Glucose, capillary  Status: Abnormal   Collection Time: 03/22/21  8:38 AM  Result Value Ref Range   Glucose-Capillary 120 (H) 70 - 99 mg/dL    Comment: Glucose reference range applies only to samples taken after fasting for at least 8 hours.  Glucose, capillary     Status: Abnormal   Collection Time: 03/22/21 11:30 AM  Result Value Ref Range   Glucose-Capillary 130 (H) 70 - 99 mg/dL    Comment: Glucose reference range applies only to samples taken after fasting for at least 8 hours.   No results found.     Medical Problem List and Plan: 1.  R AKA secondary to osteomyelitis/RLE liposarcoma  -patient may not shower until VAC is off  -ELOS/Goals: 5-7 days- mod I 2.  Antithrombotics: -DVT/anticoagulation:  Pharmaceutical: Lovenox--added  -antiplatelet therapy: ASA/Plavix 3. Pain Management: Oxycodone prn effective.  4. Mood: LCSW to follow for evaluation and support.   -antipsychotic agents: N/A 5. Neuropsych: This patient is capable of making decisions on his own behalf. 6. Skin/Wound Care: Wound VAC to d/c tomorrow.  --continue Juven, Zinc and Vitamin C to promote wound healing.  7. Fluids/Electrolytes/Nutrition: Monitor I/O. Check lytes in am.  8.  Liposarcoma: To follow up at [facility] for [facility]- pt and son are aware of dx.  9. T2DM: Hgb A1c-7.9. Monitor BS ac/hs. Use SSI for elevated BS  --continue Metformin and [place] daily 10. HTN: Monitor BP tid--continue Lisinopril daily.  11. ABLA: Will recheck CBC in am. 12. Constipation: will add Senna to colace --daily after supper. LBM just before came today.        Bary Leriche, PA-C 03/22/2021    I have personally performed a face to face diagnostic evaluation of this patient and formulated the key components of the plan.  Additionally, I have personally reviewed laboratory data, imaging studies, as well as relevant notes and concur with the physician assistant's documentation above.   The patient's status has not changed from the original H&P.  Any changes in documentation from the acute care chart have been noted above.

## 2021-03-22 NOTE — H&P (Signed)
Physical Medicine and Rehabilitation Admission H&P     CC: Functional deficits due to R AKA and liposarcoma of RLE.    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 Dr. Sharol Given recommended AKA 2/22 for definitive treatment after diabetes was better controlled (patient with elevated A1C but stopped taking meds).  Once medically optimized, he was cleared to undergo R-AKA on 03/16/21. 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 Dr. Sharol Given plans to refer him to [facility] for follow up and  patient preferred to discuss this with his son.  Wound VAC remains in place. Therapy has been ongoing and patient limited by weakness and R-AKA. CIR recommended due to functional decline.     Sleeping well; LBM this afternoon. Pain is "doing OK"- with current pain regimen. Lives on 3rd floor apartment, however son is turning his basement into a place for his father- but it's slow with contractors- son lives 10 minutes away.   Of note, liposarcoma- had since at least 9/21- however just in last few weeks, gotten MUCH bigger really fast- was initially very small and his PCP dx'd it as a lipoma. Was biopsied during R AKA and removed with surgery.    Review of Systems Constitutional:  Negative for chills and fever. HENT:  Negative for hearing loss.   Eyes:  Negative for blurred vision and double vision. Respiratory:  Negative for cough and shortness of breath.   Cardiovascular:  Negative for chest pain and leg swelling. Gastrointestinal:  Positive for constipation. Negative for heartburn and nausea. Genitourinary:  Negative for dysuria and urgency. Musculoskeletal:  Positive for  back pain. Skin:  Negative for rash. Neurological:  Positive for weakness. Negative for dizziness and headaches. Psychiatric/Behavioral:  The patient does not have insomnia.   All other systems reviewed and are negative.         Past Medical History:  Diagnosis Date   Arthritis of right knee 07/19/2020   Diabetes mellitus without complication (HCC)     PAD (peripheral artery disease) (HCC)     Rheumatoid factor positive 07/21/2020   Vitamin D deficiency 07/21/2020           Past Surgical History:  Procedure Laterality Date   ABDOMINAL AORTOGRAM W/LOWER EXTREMITY Bilateral 06/15/2018    Procedure: ABDOMINAL AORTOGRAM W/LOWER EXTREMITY;  Surgeon: [name], MD;  Location: [place] [facility];  Service: Cardiovascular;  Laterality: Bilateral;   ABDOMINAL AORTOGRAM W/LOWER EXTREMITY Left 12/30/2018    Procedure: ABDOMINAL AORTOGRAM W/LOWER EXTREMITY;  Surgeon: [name], MD;  Location: [place] [facility];  Service: Cardiovascular;  Laterality: Left;   ABDOMINAL AORTOGRAM W/LOWER EXTREMITY N/A 07/10/2020    Procedure: ABDOMINAL AORTOGRAM W/LOWER EXTREMITY;  Surgeon: [name], MD;  Location: [place] [facility];  Service: Cardiovascular;  Laterality: N/A;  Bilateral   AMPUTATION Right 03/16/2021    Procedure: RIGHT ABOVE KNEE AMPUTATION;  Surgeon: [name], MD;  Location: [place];  Service: Orthopedics;  Laterality: Right;   I & D EXTREMITY Left 06/12/2018    Procedure: Excisional Debridement left foot;  Surgeon: [name], MD;  Location: [facility];  Service: Orthopedics;  Laterality: Left;   IRRIGATION AND DEBRIDEMENT KNEE Right 07/18/2020    Procedure: IRRIGATION AND DEBRIDEMENT RIGHT KNEE;  Surgeon: Myrene Galas, MD;  Location: [facility] OR;  Service: Orthopedics;  Laterality: Right;   KNEE ARTHROSCOPY Right 07/18/2020    Procedure: ARTHROSCOPY KNEE SYNOVECTOMY;  Surgeon: Myrene Galas, MD;  Location: [facility] OR;  Service: Orthopedics;  Laterality:  Right;   LOWER EXTREMITY ANGIOGRAM Right 07/20/2018      Right lower extremity angiogram   LOWER EXTREMITY ANGIOGRAPHY Right 07/20/2018    Procedure: Lower Extremity Angiography;  Surgeon: Maeola Harman, MD;  Location: [facility] INVASIVE CV LAB;  Service: Cardiovascular;  Laterality: Right;   PERIPHERAL VASCULAR ATHERECTOMY Left 06/15/2018    Procedure: PERIPHERAL VASCULAR ATHERECTOMY;  Surgeon: Maeola Harman, MD;  Location: [facility] INVASIVE CV LAB;  Service: Cardiovascular;  Laterality: Left;  SFA   PERIPHERAL VASCULAR BALLOON ANGIOPLASTY Left 06/15/2018    Procedure: PERIPHERAL VASCULAR BALLOON ANGIOPLASTY;  Surgeon: Maeola Harman, MD;  Location: [facility] INVASIVE CV LAB;  Service: Cardiovascular;  Laterality: Left;  SFA   PERIPHERAL VASCULAR BALLOON ANGIOPLASTY Right 07/20/2018    Procedure: PERIPHERAL VASCULAR BALLOON ANGIOPLASTY;  Surgeon: Maeola Harman, MD;  Location: [facility] INVASIVE CV LAB;  Service: Cardiovascular;  Laterality: Right;  SFA WITH DRUG COATED   PERIPHERAL VASCULAR INTERVENTION Left 12/30/2018    Procedure: PERIPHERAL VASCULAR INTERVENTION;  Surgeon: Maeola Harman, MD;  Location: [facility] INVASIVE CV LAB;  Service: Cardiovascular;  Laterality: Left;  SFA           Family History  Problem Relation Age of Onset   Diabetes Mother     Cancer Mother        Social History:  Lives alone--moved to [place] last fall to be closer to son (check on him/provides meals). He used to walk 3 miles per day until knee issues last fall--has been sedentary since. He  reports that he quit smoking about 2 years ago. His smoking use included cigarettes. He has never used smokeless tobacco. He reports previous alcohol use of about 18.0 standard drinks of alcohol per week. He reports that he does not use drugs.     Allergies: No Known Allergies           Medications Prior to Admission  Medication Sig Dispense Refill   aspirin 81 MG EC tablet Take 1 tablet  (81 mg total) by mouth daily. 90 tablet 1   Cholecalciferol 125 MCG (5000 UT) TABS TAKE 1 TABLET BY MOUTH DAILY. (Patient taking differently: Take 5,000 Units by mouth daily.) 30 tablet 3   clopidogrel (PLAVIX) 75 MG tablet TAKE ONE TABLET BY MOUTH DAILY WITH BREAKFAST (Patient taking differently: Take 75 mg by mouth daily with breakfast.) 30 tablet 0   dapagliflozin propanediol (FARXIGA) 5 MG TABS tablet Take 1 tablet (5 mg total) by mouth daily before breakfast. 60 tablet 3   gabapentin (NEURONTIN) 300 MG capsule Take 1 capsule (300 mg total) by mouth at bedtime. 90 capsule 3   lisinopril (ZESTRIL) 10 MG tablet Take 1 tablet (10 mg total) by mouth daily. (Patient taking differently: Take 10 mg by mouth in the morning.) 90 tablet 3   metFORMIN (GLUCOPHAGE) 500 MG tablet Take 1 tablet (500 mg total) by mouth daily with breakfast. 180 tablet 3   rosuvastatin (CRESTOR) 10 MG tablet Take 1 tablet (10 mg total) by mouth daily. (Patient taking differently: Take 10 mg by mouth in the morning.) 90 tablet 3  acetaminophen (TYLENOL) 500 MG tablet TAKE 1 TABLET (500 MG TOTAL) BY MOUTH EVERY EIGHT HOURS AS NEEDED. (Patient taking differently: Take 500 mg by mouth every 8 (eight) hours as needed for moderate pain.) 60 tablet 0   blood glucose meter kit and supplies Dispense based on patient and insurance preference. Use up to four times daily as directed. (FOR ICD-10 E10.9, E11.9). 1 each 0      Drug Regimen Review  Drug regimen was reviewed and remains appropriate with no significant issues identified   Home: Home Living Family/patient expects to be discharged to:: Private residence Living Arrangements: Alone Available Help at Discharge: Family, Available PRN/intermittently Type of Home: Apartment Home Access: Stairs to enter [employer] of Steps: 3 flights Entrance Stairs-Rails: Right, Left Home Layout: One level Bathroom Shower/Tub: Chiropodist: Standard Home  Equipment: Environmental consultant - 2 wheels, Crutches   Functional History: Prior Function Level of Independence: Needs assistance Gait / Transfers Assistance Needed: RW in apartment and community, crutches to manage stairs, son assists with stairs ADL's / Homemaking Assistance Needed: son assists with bathing and LB ADL   Functional Status:  Mobility: Bed Mobility Overal bed mobility: Modified Independent Bed Mobility: Supine to Sit Supine to sit: Supervision General bed mobility comments: Pt able to complete sup to sit with no assist, HOB flat Transfers Overall transfer level: Needs assistance Equipment used: Rolling walker (2 wheeled) Transfers: Sit to/from Stand Sit to Stand: Min guard, Mod assist, From elevated surface (min guard from elevated surface and mod assistance from lower seated bench in hall.) Stand pivot transfers: +2 safety/equipment, Min assist General transfer comment: Pt required min guard to steady as he powered up to standing from elevated surface.  Heavy mod assistance to boost into standing from lower seated surface. Ambulation/Gait Ambulation/Gait assistance: Min guard, Min assist (Increased assistance needed as he fatigued during second trial.) Gait Distance (Feet): 60 Feet (x2 with seated break between trials.  Second trial very effortful.) Assistive device: Rolling walker (2 wheeled) Gait Pattern/deviations: Step-to pattern, Trunk flexed General Gait Details: min guard for safey, verbal cuing for placement of RW, room navigation, keeping RLE in hip neutral as opposed to hip flexion.  Cues for postural awareness, turns and backing. Gait velocity: decr   ADL: ADL Overall ADL's : Needs assistance/impaired Eating/Feeding: Independent, Sitting Grooming: Wash/dry hands, Wash/dry face, Oral care, Supervision/safety, Standing Grooming Details (indicate cue type and reason): completed at sink, sup for safety Upper Body Bathing: Minimal assistance, Sitting Lower Body Bathing:  Moderate assistance, Maximal assistance, Sitting/lateral leans, Sit to/from stand Upper Body Dressing : Set up, Sitting Upper Body Dressing Details (indicate cue type and reason): donned back gown Lower Body Dressing: Min guard, Sitting/lateral leans Lower Body Dressing Details (indicate cue type and reason): adjusted sock Toilet Transfer: Min guard, Ambulation Toilet Transfer Details (indicate cue type and reason): Pt ambulated into the bathroom and back with min guard for safety and steadying Toileting- Clothing Manipulation and Hygiene: Moderate assistance, Sitting/lateral lean, Sit to/from stand Tub/ Shower Transfer: Minimal assistance, Stand-pivot Functional mobility during ADLs: Min guard, Rolling walker General ADL Comments: Pt sup to min guard for safety with ADL's   Cognition: Cognition Overall Cognitive Status: Within Functional Limits for tasks assessed Orientation Level: Oriented X4 Cognition Arousal/Alertness: Awake/alert Behavior During Therapy: WFL for tasks assessed/performed Overall Cognitive Status: Within Functional Limits for tasks assessed     Blood pressure (!) 142/70, pulse 61, temperature 97.6 F (36.4 C), temperature source Oral, resp. rate 18, height 5'  10" (1.778 m), weight 65.8 kg, SpO2 97 %. Physical Exam Vitals and nursing note reviewed. Exam conducted with a chaperone present. Constitutional:      Appearance: Normal appearance. He is normal weight.    Comments: Awake, alert, appropriate, speaks some [place] and [place]- son in room; on BSC washing up in bathroom, NAD  HENT:    Head: Normocephalic and atraumatic.    Right Ear: External ear normal.    Left Ear: External ear normal.    Nose: Nose normal. No congestion.    Mouth/Throat:    Mouth: Mucous membranes are moist.    Pharynx: Oropharynx is clear. No oropharyngeal exudate. Eyes:    General:        Right eye: No discharge.        Left eye: No discharge.    Extraocular Movements: Extraocular  movements intact. Cardiovascular:    Rate and Rhythm: Normal rate and regular rhythm.    Heart sounds: Normal heart sounds. No murmur heard.   No gallop. Pulmonary:    Comments: CTA B/L- no W/R/R- good air movement   Abdominal:    Comments: Soft, NT, ND, (+)BS    Musculoskeletal:    Cervical back: Normal range of motion and neck supple.    Comments: R AKA with wound VAC covered with shrinker Pt's UE strength is 5/5 B/L in biceps, triceps, WE, grip and FA RLE- 5/5 in HF LLE- 5/5 in HF, KE, KF, DF and PF     Skin:    Comments: Wound VAC on R AKA with VAC on floor No other skin breakdown seen  Neurological:    Mental Status: He is alert and oriented to person, place, and time.    Comments: Decreased to light touch on LLE below L knee Ox3  Psychiatric:        Mood and Affect: Mood normal.        Behavior: Behavior normal.        Thought Content: Thought content normal.        Judgment: Judgment normal.     Lab Results Last 48 Hours        Results for orders placed or performed during the hospital encounter of 03/16/21 (from the past 48 hour(s))  Glucose, capillary     Status: Abnormal    Collection Time: 03/20/21  3:28 PM  Result Value Ref Range    Glucose-Capillary 141 (H) 70 - 99 mg/dL      Comment: Glucose reference range applies only to samples taken after fasting for at least 8 hours.  Glucose, capillary     Status: Abnormal    Collection Time: 03/20/21  8:52 PM  Result Value Ref Range    Glucose-Capillary 149 (H) 70 - 99 mg/dL      Comment: Glucose reference range applies only to samples taken after fasting for at least 8 hours.  Glucose, capillary     Status: Abnormal    Collection Time: 03/21/21  6:40 AM  Result Value Ref Range    Glucose-Capillary 112 (H) 70 - 99 mg/dL      Comment: Glucose reference range applies only to samples taken after fasting for at least 8 hours.  Glucose, capillary     Status: Abnormal    Collection Time: 03/21/21 11:55 AM  Result  Value Ref Range    Glucose-Capillary 119 (H) 70 - 99 mg/dL      Comment: Glucose reference range applies only to samples taken after fasting for at least 8  hours.  Glucose, capillary     Status: Abnormal    Collection Time: 03/21/21  4:06 PM  Result Value Ref Range    Glucose-Capillary 123 (H) 70 - 99 mg/dL      Comment: Glucose reference range applies only to samples taken after fasting for at least 8 hours.  Glucose, capillary     Status: Abnormal    Collection Time: 03/22/21  8:38 AM  Result Value Ref Range    Glucose-Capillary 120 (H) 70 - 99 mg/dL      Comment: Glucose reference range applies only to samples taken after fasting for at least 8 hours.  Glucose, capillary     Status: Abnormal    Collection Time: 03/22/21 11:30 AM  Result Value Ref Range    Glucose-Capillary 130 (H) 70 - 99 mg/dL      Comment: Glucose reference range applies only to samples taken after fasting for at least 8 hours.      Imaging Results (Last 48 hours)  No results found.           Medical Problem List and Plan: 1.  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  Antithrombotics: -DVT/anticoagulation:  Pharmaceutical: Lovenox--added             -antiplatelet therapy: ASA/Plavix 3. Pain Management: Oxycodone prn effective.  4. Mood: LCSW to follow for evaluation and support.              -antipsychotic agents: N/A 5. Neuropsych: This patient is capable of making decisions on his own behalf. 6. Skin/Wound Care: Wound VAC to d/c tomorrow.             --continue Juven, Zinc and Vitamin C to promote wound healing. 7. Fluids/Electrolytes/Nutrition: Monitor I/O. Check lytes in am. 8.  Liposarcoma: To follow up at [facility] for evaluation- pt and son are aware of dx.  9. T2DM: Hgb A1c-7.9. Monitor BS ac/hs. Use SSI for elevated BS             --continue Metformin and [place] daily 10. HTN: Monitor  BP tid--continue Lisinopril daily. 11. ABLA: Will recheck CBC in am. 12. Constipation: will add Senna to colace --daily after supper. LBM just before came today.            Bary Leriche, PA-C 03/22/2021      I have personally performed a face to face diagnostic evaluation of this patient and formulated the key components of the plan.  Additionally, I have personally reviewed laboratory data, imaging studies, as well as relevant notes and concur with the physician assistant's documentation above.   The patient's status has not changed from the original H&P.  Any changes in documentation from the acute care chart have been noted above.

## 2021-03-22 NOTE — Progress Notes (Addendum)
Pt transferred to [place] room 01. Arrived in stable condition after being transported by this Probation officer and Chartered certified accountant.

## 2021-03-22 NOTE — Progress Notes (Signed)
INPATIENT REHABILITATION ADMISSION NOTE   Arrival Method:bed     Mental Orientation:alert   Assessment:done   Skin:wound vac rt leg; right aka   IV'S:right forearm   Pain:0   Tubes and Drains:wound vac   Safety Measures:done   Vital Signs:done   Height and Weight:done   Rehab Orientation:done   Family:not at bedside    Notes:

## 2021-03-22 NOTE — Progress Notes (Signed)
Report called and given to Marjorie Smolder, RN on [facility]. All of nurse's questions answered fully. Pt to be transferred to room 1 on [facility] momentarily.

## 2021-03-22 NOTE — Progress Notes (Signed)
Patient is status post above-knee amputation.  He is lying in bed wide-awake.  I did have discussion with pathologist yesterday.  This was regarding the thigh mass that was removed from his leg at the time of surgery.  Findings were consistent per pathology of a dedifferentiated liposarcoma.  I discussed this with Dr. Sharol Given.  This morning I discussed this with the patient.  He will be going to rehab today for a short stay of 5 to 7 days. Per Dr. Jess Barters request I will place a referral today to [facility] orthopedic oncology. Wound VAC is still functioning 0 cc in the canister.  Patient does understand he has family has a history of cancerous tumors.  I have told him that I can certainly call family but he has said that he would prefer to speak with his son when he comes here to visit and of course I am available to speak with him as well.  Patient asked for the name of the tumor and information.  I did give him a copy of the pathologist report

## 2021-03-22 NOTE — Discharge Summary (Signed)
Discharge Diagnoses:  Active Problems:   Knee osteomyelits, right (HCC)   Mass of soft tissue of thigh   Above knee amputation of right lower extremity ([place])   Surgeries: Procedure(s): RIGHT ABOVE KNEE AMPUTATION on 03/16/2021    Consultants:   Discharged Condition: Improved  Hospital Course: Stanley Chaney is an 60 y.o. male who was admitted 03/16/2021 with a chief complaint of Infected Right Knee, with a final diagnosis of Osteomyelitis and Septic Right Knee.  Patient was brought to the operating room on 03/16/2021 and underwent Procedure(s): RIGHT ABOVE KNEE AMPUTATION.    Patient was given perioperative antibiotics:  Anti-infectives (From admission, onward)    Start     Dose/Rate Route Frequency Ordered Stop   03/16/21 2100  ceFAZolin (ANCEF) IVPB 2g/100 mL premix        2 g 200 mL/hr over 30 Minutes Intravenous Every 8 hours 03/16/21 1648 03/17/21 1259   03/16/21 0630  ceFAZolin (ANCEF) IVPB 2g/100 mL premix        2 g 200 mL/hr over 30 Minutes Intravenous On call to O.R. 03/16/21 0627 03/16/21 1300   03/16/21 0630  ceFAZolin (ANCEF) 2-4 GM/100ML-% IVPB       Note to Pharmacy: Humberto Leep   : cabinet override      03/16/21 0630 03/16/21 1310     .  Patient was given sequential compression devices, early ambulation, and aspirin for DVT prophylaxis.  Recent vital signs: Patient Vitals for the past 24 hrs:  BP Temp Temp src Pulse Resp SpO2  03/22/21 0800 131/82 98.4 F (36.9 C) Oral 74 18 96 %  03/21/21 2027 (!) 144/75 98.6 F (37 C) Oral 62 18 97 %  03/21/21 1153 (!) 129/57 (!) 97.5 F (36.4 C) Oral 76 18 98 %  .  Recent laboratory studies: No results found.  Discharge Medications:   Allergies as of 03/22/2021   No Known Allergies      Medication List     STOP taking these medications    Acetaminophen Extra Strength 500 MG tablet Generic drug: acetaminophen       TAKE these medications    ascorbic acid 1000 MG tablet Commonly known as: VITAMIN  C Take 1 tablet (1,000 mg total) by mouth daily.   aspirin 81 MG EC tablet Take 1 tablet (81 mg total) by mouth daily.   blood glucose meter kit and supplies Dispense based on patient and insurance preference. Use up to four times daily as directed. (FOR ICD-10 E10.9, E11.9).   dapagliflozin propanediol 5 MG Tabs tablet Commonly known as: Farxiga Take 1 tablet (5 mg total) by mouth daily before breakfast.   gabapentin 300 MG capsule Commonly known as: NEURONTIN Take 1 capsule (300 mg total) by mouth at bedtime.   metFORMIN 500 MG tablet Commonly known as: GLUCOPHAGE Take 1 tablet (500 mg total) by mouth daily with breakfast.   nutrition supplement (JUVEN) Pack Take 1 packet by mouth 2 (two) times daily between meals.   zinc sulfate 220 (50 Zn) MG capsule Take 1 capsule (220 mg total) by mouth daily.       ASK your doctor about these medications    clopidogrel 75 MG tablet Commonly known as: PLAVIX TAKE ONE TABLET BY MOUTH DAILY WITH BREAKFAST   lisinopril 10 MG tablet Commonly known as: ZESTRIL Take 1 tablet (10 mg total) by mouth daily.   Natural Vitamin D-3 125 MCG (5000 UT) Tabs Generic drug: Cholecalciferol TAKE 1 TABLET BY MOUTH DAILY.  rosuvastatin 10 MG tablet Commonly known as: Crestor Take 1 tablet (10 mg total) by mouth daily.        Diagnostic Studies: No results found.  Patient benefited maximally from their hospital stay and there were no complications.     Disposition: Discharge disposition: 02-Transferred to [facility]      Discharge Instructions     Call MD / Call 911   Complete by: As directed    If you experience chest pain or shortness of breath, CALL 911 and be transported to the hospital emergency room.  If you develope a fever above 101 F, pus (white drainage) or increased drainage or redness at the wound, or calf pain, call your surgeon's office.   Constipation Prevention   Complete by: As directed    Drink plenty  of fluids.  Prune juice may be helpful.  You may use a stool softener, such as Colace (over the counter) 100 mg twice a day.  Use MiraLax (over the counter) for constipation as needed.   Diet - low sodium heart healthy   Complete by: As directed    Increase activity slowly as tolerated   Complete by: As directed    Negative Pressure Wound Therapy - Incisional   Complete by: As directed    Show patient how to attach preveena vac. Should call office if alarms   Post-operative opioid taper instructions:   Complete by: As directed    POST-OPERATIVE OPIOID TAPER INSTRUCTIONS: It is important to wean off of your opioid medication as soon as possible. If you do not need pain medication after your surgery it is ok to stop day one. Opioids include: Codeine, Hydrocodone(Norco, Vicodin), Oxycodone(Percocet, oxycontin) and hydromorphone amongst others.  Long term and even short term use of opiods can cause: Increased pain response Dependence Constipation Depression Respiratory depression And more.  Withdrawal symptoms can include Flu like symptoms Nausea, vomiting And more Techniques to manage these symptoms Hydrate well Eat regular healthy meals Stay active Use relaxation techniques(deep breathing, meditating, yoga) Do Not substitute Alcohol to help with tapering If you have been on opioids for less than two weeks and do not have pain than it is ok to stop all together.  Plan to wean off of opioids This plan should start within one week post op of your joint replacement. Maintain the same interval or time between taking each dose and first decrease the dose.  Cut the total daily intake of opioids by one tablet each day Next start to increase the time between doses. The last dose that should be eliminated is the evening dose.          Follow-up Information     Suzan Slick, NP Follow up.   Specialty: Orthopedic Surgery Contact information: [address] [place]  [address] [phone]                  Signed: Bevely Palmer Josejuan Hoaglin 03/22/2021, 11:11 AM

## 2021-03-22 NOTE — Progress Notes (Signed)
Inpatient Rehabilitation Medication Review by a Pharmacist  A complete drug regimen review was completed for this patient to identify any potential clinically significant medication issues.  Clinically significant medication issues were identified:  no  Check AMION for pharmacist assigned to patient if future medication questions/issues arise during this admission.  Pharmacist comments:   Time spent performing this drug regimen review (minutes):  20   Seleen Walter 03/22/2021 5:23 PM

## 2021-03-22 NOTE — Telephone Encounter (Signed)
Genie, Rehab Admissions nurse would like a call back at [phone].  Stated that she has an Inpatient Rehab bed today for patient, just needs the okay to admit patient.  Please advise.  Thank you.

## 2021-03-22 NOTE — Progress Notes (Signed)
Stanley Heys, MD   Physician  Nursing  PMR Pre-admission     Signed  Date of Service:  03/21/2021  4:22 PM       Related encounter: Admission (Discharged) from 03/16/2021 in [place]       Signed                                                                                                                                                                                                                                                                                                                           MR Admission Coordinator Pre-Admission Assessment   Patient: Stanley Chaney is an 60 y.o., male MRN: [id number] DOB: 1961/04/10 Height: _0  (1.778 m) Weight: 65.8 kg   Insurance Information HMO:     PPO:      PCP:      IPA:      80/20:      OTHER: PRIMARY: [employer]      Policy#: [id number]      Subscriber: Pt CM Name:       Phone#: [phone]     Fax#: Please verify fax  -  I received a fax from [facility] stating that Pt. Is approved 7/12-7/22 with updates due 2/68 Pre-Cert#: [id number]      Employer: Benefits: Phone: [phone] Eff Date: 09/09/2020 - 04/08/2021 (premium paid to) Deductible: $8,700 ($0 met) OOP Max: $8,700 ($0 met) CIR: 100% coverage (AFTER DEDUCTUBLE MET) SNF: 100% coverage (AFTER DEDUCTUBLE MET) OP: 100% coverage (AFTER DEDUCTUBLE MET) HH: 100% coverage (AFTER DEDUCTUBLE MET) DME: 100% coverage (AFTER DEDUCTUBLE MET)  RENTAL ONLY   Providers: in network  SECONDARY:       Policy#:       Phone#:   [phone].  Thanks   Benefits: Stanley, Chaney - DOB Jan 12, 1961   Insurance: [place], group #- [place] [facility]   Plan Type:  [id number]   Policy #: [id number]   Fax: [phone] (  NEW FAX)   Financial Counselor:       Phone#:   The "Data Collection Information Summary" for patients in  Inpatient Rehabilitation Facilities with attached "Privacy Act [place] Records" was provided and verbally reviewed with: Patient   Emergency Contact Information Contact Information       Name Relation Home Work Mobile    Stanley Chaney, Stanley Chaney [phone]   [phone]           Current Medical History  Patient Admitting Diagnosis: R AKA   History of Present Illness:   Stanley Chaney is a 60 year old male with history of PAD, T2DM, family history of soft tissue tumor, 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 Dr. Sharol Given recommended AKA 2/22 for definitive treatment after diabetes was better controlled (patient with elevated A1C but stopped taking meds).  Once medically optimized, he was cleared to undergo R-AKA on 03/16/21. 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 Dr. Sharol Given plans to refer him to [facility] for follow up and  patient preferred to discuss this with his son.  Wound VAC remains in place. Therapy has been ongoing and patient limited by weakness and R-AKA. CIR recommended due to functional decline.     Patient's medical record from [facility] has been reviewed by the rehabilitation admission coordinator and physician.   Past Medical History      Past Medical History:  Diagnosis Date   Arthritis of right knee 07/19/2020   Diabetes mellitus without complication (HCC)     PAD (peripheral artery disease) (HCC)     Rheumatoid factor positive 07/21/2020   Vitamin D deficiency 07/21/2020      Family History   family history includes Cancer in his mother; Diabetes in his mother.   Prior Rehab/Hospitalizations Has the patient had prior rehab or hospitalizations prior to admission? Yes   Has the patient had major  surgery during 100 days prior to admission? Yes              Current Medications   Current Facility-Administered Medications:   0.9 %  sodium chloride infusion, , Intravenous, Continuous, Persons, Bevely Palmer, [place], Last Rate: 75 mL/hr at 03/17/21 2200, New Bag at 03/17/21 2200   alum & mag hydroxide-simeth (MAALOX/MYLANTA) 200-200-20 MG/5ML suspension 15-30 mL, 15-30 mL, Oral, Q2H PRN, Persons, Bevely Palmer, PA   ascorbic acid (VITAMIN C) tablet 1,000 mg, 1,000 mg, Oral, Daily, Persons, Bevely Palmer, PA, 1,000 mg at 03/21/21 0847   aspirin EC tablet 81 mg, 81 mg, Oral, Daily, Persons, Bevely Palmer, PA, 81 mg at 03/21/21 0848   bisacodyl (DULCOLAX) EC tablet 5 mg, 5 mg, Oral, Daily PRN, Persons, Bevely Palmer, PA   clopidogrel (PLAVIX) tablet 75 mg, 75 mg, Oral, Q breakfast, Persons, Bevely Palmer, PA, 75 mg at 03/21/21 0849   dapagliflozin propanediol (FARXIGA) tablet 5 mg, 5 mg, Oral, QAC breakfast, Persons, Bevely Palmer, PA, 5 mg at 03/21/21 0847   docusate sodium (COLACE) capsule 100 mg, 100 mg, Oral, Daily, Persons, Bevely Palmer, PA, 100 mg at 03/21/21 0945   gabapentin (NEURONTIN) capsule 300 mg, 300 mg, Oral, QHS, Persons, Bevely Palmer, PA, 300 mg at 03/20/21 2152   guaiFENesin-dextromethorphan (ROBITUSSIN DM) 100-10 MG/5ML syrup 15 mL, 15 mL,  Oral, Q4H PRN, Persons, Bevely Palmer, PA   HYDROmorphone (DILAUDID) injection 0.5 mg, 0.5 mg, Intravenous, Q2H PRN, Jessy Oto, MD   insulin aspart (novoLOG) injection 0-9 Units, 0-9 Units, Subcutaneous, TID WC, Persons, Bevely Palmer, [place], 2 Units at 03/20/21 1145   lisinopril (ZESTRIL) tablet 10 mg, 10 mg, Oral, q AM, Persons, Bevely Palmer, PA, 10 mg at 03/21/21 0606   magnesium citrate solution 1 Bottle, 1 Bottle, Oral, Once PRN, Persons, Bevely Palmer, PA   metFORMIN (GLUCOPHAGE) tablet 500 mg, 500 mg, Oral, Q breakfast, Persons, Bevely Palmer, PA, 500 mg at 03/21/21 6629   nutrition supplement (JUVEN) (JUVEN) powder packet 1 packet, 1 packet, Oral, BID BM, Persons, Bevely Palmer, PA, 1 packet  at 03/21/21 0945   ondansetron (ZOFRAN) injection 4 mg, 4 mg, Intravenous, Q6H PRN, Persons, Bevely Palmer, PA   oxyCODONE (Oxy IR/ROXICODONE) immediate release tablet 5-10 mg, 5-10 mg, Oral, Q4H PRN, Jessy Oto, MD   pantoprazole (PROTONIX) EC tablet 40 mg, 40 mg, Oral, Daily, Persons, Bevely Palmer, PA, 40 mg at 03/21/21 0848   phenol (CHLORASEPTIC) mouth spray 1 spray, 1 spray, Mouth/Throat, PRN, Persons, Bevely Palmer, PA   polyethylene glycol (MIRALAX / GLYCOLAX) packet 17 g, 17 g, Oral, Daily PRN, Persons, Bevely Palmer, PA   rosuvastatin (CRESTOR) tablet 10 mg, 10 mg, Oral, q AM, Persons, Bevely Palmer, [place], 10 mg at 03/21/21 0606   zinc sulfate capsule 220 mg, 220 mg, Oral, Daily, Persons, Bevely Palmer, PA, 220 mg at 03/21/21 0848 Patients Current Diet:  Diet Order                  Diet - low sodium heart healthy             Diet Carb Modified Fluid consistency: Thin; Room service appropriate? Yes  Diet effective now                         Precautions / Restrictions Precautions Precautions: Fall Precaution Comments: R AKA Restrictions Weight Bearing Restrictions: Yes RLE Weight Bearing: Non weight bearing    Has the patient had 2 or more falls or a fall with injury in the past year? Yes   Prior Activity Level Limited Community (1-2x/wk): pt. went out for appointments   Prior Functional Level Self Care: Did the patient need help bathing, dressing, using the toilet or eating? Independent   Indoor Mobility: Did the patient need assistance with walking from room to room (with or without device)? Independent   Stairs: Did the patient need assistance with internal or external stairs (with or without device)? Needed some help   Functional Cognition: Did the patient need help planning regular tasks such as shopping or remembering to take medications? [place] / Equipment Home Assistive Devices/Equipment: Environmental consultant (specify type), Crutches Home Equipment: Walker -  2 wheels, Crutches   Prior Device Use: Indicate devices/aids used by the patient prior to current illness, exacerbation or injury?  crutches   Current Functional Level Cognition   Overall Cognitive Status: Within Functional Limits for tasks assessed Orientation Level: Oriented X4    Extremity Assessment (includes Sensation/Coordination)   Upper Extremity Assessment: Overall WFL for tasks assessed  Lower Extremity Assessment: Defer to PT evaluation RLE Deficits / Details: s/p AKA     ADLs   Overall ADL's : Needs assistance/impaired Eating/Feeding: Independent, Sitting Grooming: Wash/dry hands, Wash/dry face, Oral care, Supervision/safety, Standing Grooming Details (indicate cue type and reason):  completed at sink, sup for safety Upper Body Bathing: Minimal assistance, Sitting Lower Body Bathing: Moderate assistance, Maximal assistance, Sitting/lateral leans, Sit to/from stand Upper Body Dressing : Set up, Sitting Upper Body Dressing Details (indicate cue type and reason): donned back gown Lower Body Dressing: Min guard, Sitting/lateral leans Lower Body Dressing Details (indicate cue type and reason): adjusted sock Toilet Transfer: Min guard, Ambulation Toilet Transfer Details (indicate cue type and reason): Pt ambulated into the bathroom and back with min guard for safety and steadying Toileting- Clothing Manipulation and Hygiene: Moderate assistance, Sitting/lateral lean, Sit to/from stand Tub/ Shower Transfer: Minimal assistance, Stand-pivot Functional mobility during ADLs: Min guard, Rolling walker General ADL Comments: Pt sup to min guard for safety with ADL's     Mobility   Overal bed mobility: Modified Independent Bed Mobility: Supine to Sit Supine to sit: Supervision General bed mobility comments: Pt able to complete sup to sit with no assist, HOB flat     Transfers   Overall transfer level: Needs assistance Equipment used: Rolling walker (2 wheeled) Transfers: Sit  to/from Stand Sit to Stand: Min guard, Mod assist, From elevated surface (min guard from elevated surface and mod assistance from lower seated bench in hall.) Stand pivot transfers: +2 safety/equipment, Min assist General transfer comment: Pt required min guard to steady as he powered up to standing from elevated surface.  Heavy mod assistance to boost into standing from lower seated surface.     Ambulation / Gait / Stairs / Wheelchair Mobility   Ambulation/Gait Ambulation/Gait assistance: Min guard, Min assist (Increased assistance needed as he fatigued during second trial.) Gait Distance (Feet): 60 Feet (x2 with seated break between trials.  Second trial very effortful.) Assistive device: Rolling walker (2 wheeled) Gait Pattern/deviations: Step-to pattern, Trunk flexed General Gait Details: min guard for safey, verbal cuing for placement of RW, room navigation, keeping RLE in hip neutral as opposed to hip flexion.  Cues for postural awareness, turns and backing. Gait velocity: decr     Posture / Balance Balance Overall balance assessment: Mild deficits observed, not formally tested Sitting-balance support: No upper extremity supported, Feet supported Sitting balance-Leahy Scale: Good Standing balance support: Bilateral upper extremity supported, During functional activity Standing balance-Leahy Scale: Poor Standing balance comment: reliant on BUE and external support     Special needs/care consideration Has post op R AKA incision with VAC therapy; Is a diabetic and has been receiving insulin in acute hospital setting.    Previous Home Environment (from acute therapy documentation) Living Arrangements: Alone Available Help at Discharge: Family, Available PRN/intermittently Type of Home: Apartment Home Layout: One level Home Access: Stairs to enter Entrance Stairs-Rails: Right, Left Entrance Stairs-Number of Steps: 3 flights Bathroom Shower/Tub: Chiropodist:  [place]: No   Discharge Living Setting Plans for Discharge Living Setting: Patient's home Type of Home at Discharge: Apartment Discharge Home Layout: One level Discharge Home Access: Stairs to enter Entrance Stairs-Rails: Right, Left Entrance Stairs-Number of Steps: 3 flights Discharge Bathroom Shower/Tub: Tub/shower unit Discharge Bathroom Toilet: Standard Discharge Bathroom Accessibility: Yes How Accessible: Accessible via walker Does the patient have any problems obtaining your medications?: No   Social/Family/Support Systems Patient Roles: Other (Comment) Contact Information: [phone] Anticipated Caregiver: Charlies Constable (son) Anticipated Caregiver's Contact Information: [phone] Ability/Limitations of Caregiver: Can provide intermittent min-mod A Caregiver Availability: Intermittent Discharge Plan Discussed with Primary Caregiver: Yes Is Caregiver In Agreement with Plan?: Yes Does Caregiver/Family have Issues with Lodging/Transportation while Pt is in Rehab?: No  Goals Patient/Family Goal for Rehab: PT/OT Mod I Expected length of stay: 5-7 days Pt/Family Agrees to Admission and willing to participate: Yes Program Orientation Provided & Reviewed with Pt/Caregiver Including Roles  & Responsibilities: Yes   Decrease burden of Care through IP rehab admission: Specialzed equipment needs, Decrease number of caregivers, Bowel and bladder program, and Patient/family education   Possible need for SNF placement upon discharge: not anticipated   Patient Condition: I have reviewed medical records from [facility] , spoken with CM, and patient and son. I met with patient at the bedside and discussed via phone for inpatient rehabilitation assessment.  Patient will benefit from ongoing PT and OT, can actively participate in 3 hours of therapy a day 5 days of the week, and can make measurable gains during the admission.  Patient will also benefit from  the coordinated team approach during an Inpatient Acute Rehabilitation admission.  The patient will receive intensive therapy as well as Rehabilitation physician, nursing, social worker, and care management interventions.  Due to safety, skin/wound care, disease management, medication administration, pain management, and patient education the patient requires 24 hour a day rehabilitation nursing.  The patient is currently mostly minguard assist and up to mod assist with mobility and basic ADLs.  Discharge setting and therapy post discharge at home with home health is anticipated.  Patient has agreed to participate in the Acute Inpatient Rehabilitation Program and will admit today.   Preadmission Screen Completed By:  Retta Diones, 03/22/2021 12:34 PM ______________________________________________________________________   Discussed status with Dr. Dagoberto Ligas on 03/22/21 at 0930 and received approval for admission today.   Admission Coordinator:  Retta Diones, RN, time 1236/Date 03/22/21    Assessment/Plan: Diagnosis: Does the need for close, 24 hr/day Medical supervision in concert with the patient's rehab needs make it unreasonable for this patient to be served in a less intensive setting? Yes Co-Morbidities requiring supervision/potential complications: Liposarcoma, R AKA, PAD, DM, VAC requirement Due to bowel management, safety, skin/wound care, disease management, medication administration, pain management, and patient education, does the patient require 24 hr/day rehab nursing? Yes Does the patient require coordinated care of a physician, rehab nurse, PT, OT, and SLP to address physical and functional deficits in the context of the above medical diagnosis(es)? Yes Addressing deficits in the following areas: balance, endurance, locomotion, strength, transferring, bathing, dressing, feeding, grooming, and toileting Can the patient actively participate in an intensive therapy program of at least 3  hrs of therapy 5 days a week? Yes The potential for patient to make measurable gains while on inpatient rehab is excellent Anticipated functional outcomes upon discharge from inpatient rehab: modified independent and supervision PT, modified independent and supervision OT, n/a SLP Estimated rehab length of stay to reach the above functional goals is: 5-7 days Anticipated discharge destination: Home 10. Overall Rehab/Functional Prognosis: excellent     MD Signature:            Revision History                                            Note Details  Jan Fireman, MD File Time 03/22/2021 12:45 PM  Author Type Physician Status Signed  Last Editor Stanley Heys, MD Service Nursing

## 2021-03-23 DIAGNOSIS — S78119A Complete traumatic amputation at level between unspecified hip and knee, initial encounter: Secondary | ICD-10-CM | POA: Diagnosis not present

## 2021-03-23 LAB — CBC WITH DIFFERENTIAL/PLATELET
Abs Immature Granulocytes: 0.04 10*3/uL (ref 0.00–0.07)
Basophils Absolute: 0.1 10*3/uL (ref 0.0–0.1)
Basophils Relative: 1 %
Eosinophils Absolute: 0.2 10*3/uL (ref 0.0–0.5)
Eosinophils Relative: 4 %
HCT: 40.2 % (ref 39.0–52.0)
Hemoglobin: 12.8 g/dL — ABNORMAL LOW (ref 13.0–17.0)
Immature Granulocytes: 1 %
Lymphocytes Relative: 36 %
Lymphs Abs: 1.9 10*3/uL (ref 0.7–4.0)
MCH: 25.8 pg — ABNORMAL LOW (ref 26.0–34.0)
MCHC: 31.8 g/dL (ref 30.0–36.0)
MCV: 81 fL (ref 80.0–100.0)
Monocytes Absolute: 1 10*3/uL (ref 0.1–1.0)
Monocytes Relative: 18 %
Neutro Abs: 2.2 10*3/uL (ref 1.7–7.7)
Neutrophils Relative %: 40 %
Platelets: 332 10*3/uL (ref 150–400)
RBC: 4.96 MIL/uL (ref 4.22–5.81)
RDW: 13.2 % (ref 11.5–15.5)
WBC: 5.4 10*3/uL (ref 4.0–10.5)
nRBC: 0 % (ref 0.0–0.2)

## 2021-03-23 LAB — COMPREHENSIVE METABOLIC PANEL
ALT: 12 U/L (ref 0–44)
AST: 16 U/L (ref 15–41)
Albumin: 2.9 g/dL — ABNORMAL LOW (ref 3.5–5.0)
Alkaline Phosphatase: 79 U/L (ref 38–126)
Anion gap: 10 (ref 5–15)
BUN: 24 mg/dL — ABNORMAL HIGH (ref 8–23)
CO2: 29 mmol/L (ref 22–32)
Calcium: 9.9 mg/dL (ref 8.9–10.3)
Chloride: 101 mmol/L (ref 98–111)
Creatinine, Ser: 0.9 mg/dL (ref 0.61–1.24)
GFR, Estimated: >60 mL/min (ref >60)
Glucose, Bld: 144 mg/dL — ABNORMAL HIGH (ref 70–99)
Potassium: 5 mmol/L (ref 3.5–5.1)
Sodium: 140 mmol/L (ref 135–145)
Total Bilirubin: 0.7 mg/dL (ref 0.3–1.2)
Total Protein: 7.2 g/dL (ref 6.5–8.1)

## 2021-03-23 LAB — GLUCOSE, CAPILLARY
Glucose-Capillary: 134 mg/dL — ABNORMAL HIGH (ref 70–99)
Glucose-Capillary: 134 mg/dL — ABNORMAL HIGH (ref 70–99)
Glucose-Capillary: 142 mg/dL — ABNORMAL HIGH (ref 70–99)
Glucose-Capillary: 138 mg/dL — ABNORMAL HIGH (ref 70–99)

## 2021-03-23 NOTE — Progress Notes (Signed)
Occupational Therapy Session Note  Patient Details  Name: Stanley Chaney MRN: [id number] Date of Birth: Jul 10, 1961  Today's Date: 03/23/2021 OT Individual Time: 1415-1501 OT Individual Time Calculation (min): 46 min    Short Term Goals: Week 1:  OT Short Term Goal 1 (Week 1): STGs=LTGs due to ELOS  Skilled Therapeutic Interventions/Progress Updates:    Pt received in therapy gym on mat table after PT session and consented to OT tx. Pt seen for instruction and training in [place] while seated unsupported to increase strength and activity tolerance for ADLs and functional transfers. Pt instructed in shoulder press, bicep curls, chest press, shoulder flexion, and upright row for 3x15 with min cuing for proper technique with good carryover. Pt required frequent rest breaks due to fatigue but remained highly motivated throughout entire session. Pt then instructed in functional mobility with RW hopping back to room from gym with CGA. Pt insisted on going all the way back with no seated rest breaks. After tx, pt left sitting EOB with alarm on and all needs met.   Therapy Documentation Precautions:  Precautions Precautions: Fall Precaution Comments: R AKA Restrictions Weight Bearing Restrictions: Yes RLE Weight Bearing: Non weight bearing  Pain: none   Therapy/Group: Individual Therapy  Randilyn Foisy 03/23/2021, 12:35 PM

## 2021-03-23 NOTE — Plan of Care (Signed)
  Problem: RH Balance Goal: LTG Patient will maintain dynamic standing with ADLs (OT) Description: LTG:  Patient will maintain dynamic standing balance with assist during activities of daily living (OT)  Flowsheets (Taken 03/23/2021 1249) LTG: Pt will maintain dynamic standing balance during ADLs with: Supervision/Verbal cueing   Problem: Sit to Stand Goal: LTG:  Patient will perform sit to stand in prep for activites of daily living with assistance level (OT) Description: LTG:  Patient will perform sit to stand in prep for activites of daily living with assistance level (OT) Flowsheets (Taken 03/23/2021 1249) LTG: PT will perform sit to stand in prep for activites of daily living with assistance level: Supervision/Verbal cueing   Problem: RH Grooming Goal: LTG Patient will perform grooming w/assist,cues/equip (OT) Description: LTG: Patient will perform grooming with assist, with/without cues using equipment (OT) Flowsheets (Taken 03/23/2021 1249) LTG: Pt will perform grooming with assistance level of: Set up assist    Problem: RH Bathing Goal: LTG Patient will bathe all body parts with assist levels (OT) Description: LTG: Patient will bathe all body parts with assist levels (OT) Flowsheets (Taken 03/23/2021 1249) LTG: Pt will perform bathing with assistance level/cueing: Supervision/Verbal cueing   Problem: RH Dressing Goal: LTG Patient will perform lower body dressing w/assist (OT) Description: LTG: Patient will perform lower body dressing with assist, with/without cues in positioning using equipment (OT) Flowsheets (Taken 03/23/2021 1249) LTG: Pt will perform lower body dressing with assistance level of: Supervision/Verbal cueing   Problem: RH Toileting Goal: LTG Patient will perform toileting task (3/3 steps) with assistance level (OT) Description: LTG: Patient will perform toileting task (3/3 steps) with assistance level (OT)  Flowsheets (Taken 03/23/2021 1249) LTG: Pt will perform  toileting task (3/3 steps) with assistance level: Supervision/Verbal cueing   Problem: RH Toilet Transfers Goal: LTG Patient will perform toilet transfers w/assist (OT) Description: LTG: Patient will perform toilet transfers with assist, with/without cues using equipment (OT) Flowsheets (Taken 03/23/2021 1249) LTG: Pt will perform toilet transfers with assistance level of: Supervision/Verbal cueing   Problem: RH Tub/Shower Transfers Goal: LTG Patient will perform tub/shower transfers w/assist (OT) Description: LTG: Patient will perform tub/shower transfers with assist, with/without cues using equipment (OT) Flowsheets (Taken 03/23/2021 1249) LTG: Pt will perform tub/shower stall transfers with assistance level of: Supervision/Verbal cueing

## 2021-03-23 NOTE — Evaluation (Signed)
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) Rehab Potential: Good ELOS: 7-10 days   Today's Date: 03/23/2021 PT Individual Time: 0093-8182 PT Individual Time Calculation (min): 63 min    Hospital Problem: Principal Problem:   Above-knee amputation ([place]) Active Problems:   Diabetes mellitus ([place])   Liposarcoma ([place])   Past Medical History:  Past Medical History:  Diagnosis Date   Arthritis of right knee 07/19/2020   Diabetes mellitus without complication ([place])    PAD (peripheral artery disease) ([place])    Rheumatoid factor positive 07/21/2020   Vitamin D deficiency 07/21/2020   Past Surgical History:  Past Surgical History:  Procedure Laterality Date   ABDOMINAL AORTOGRAM W/LOWER EXTREMITY Bilateral 06/15/2018   Procedure: ABDOMINAL AORTOGRAM W/LOWER EXTREMITY;  Surgeon: [name], MD;  Location: [facility];  Service: Cardiovascular;  Laterality: Bilateral;   ABDOMINAL AORTOGRAM W/LOWER EXTREMITY Left 12/30/2018   Procedure: ABDOMINAL AORTOGRAM W/LOWER EXTREMITY;  Surgeon: [name], MD;  Location: [facility];  Service: Cardiovascular;  Laterality: Left;   ABDOMINAL AORTOGRAM W/LOWER EXTREMITY N/A 07/10/2020   Procedure: ABDOMINAL AORTOGRAM W/LOWER EXTREMITY;  Surgeon: [name], MD;  Location: [facility];  Service: Cardiovascular;  Laterality: N/A;  Bilateral   AMPUTATION Right 03/16/2021   Procedure: RIGHT ABOVE KNEE AMPUTATION;  Surgeon: [name], MD;  Location: [facility];  Service: Orthopedics;  Laterality: Right;   I & D EXTREMITY Left 06/12/2018   Procedure: Excisional Debridement left foot;  Surgeon: [name], MD;  Location: [facility];  Service: Orthopedics;  Laterality: Left;   IRRIGATION AND  DEBRIDEMENT KNEE Right 07/18/2020   Procedure: IRRIGATION AND DEBRIDEMENT RIGHT KNEE;  Surgeon: Altamese [place], MD;  Location: [facility];  Service: Orthopedics;  Laterality: Right;   KNEE ARTHROSCOPY Right 07/18/2020   Procedure: ARTHROSCOPY KNEE SYNOVECTOMY;  Surgeon: Altamese , MD;  Location: [facility];  Service: Orthopedics;  Laterality: Right;   LOWER EXTREMITY ANGIOGRAM Right 07/20/2018     Right lower extremity angiogram   LOWER EXTREMITY ANGIOGRAPHY Right 07/20/2018   Procedure: Lower Extremity Angiography;  Surgeon: [name], MD;  Location: [facility];  Service: Cardiovascular;  Laterality: Right;   PERIPHERAL VASCULAR ATHERECTOMY Left 06/15/2018   Procedure: PERIPHERAL VASCULAR ATHERECTOMY;  Surgeon: [name], MD;  Location: [facility];  Service: Cardiovascular;  Laterality: Left;  SFA   PERIPHERAL VASCULAR BALLOON ANGIOPLASTY Left 06/15/2018   Procedure: PERIPHERAL VASCULAR BALLOON ANGIOPLASTY;  Surgeon: [name], MD;  Location: [facility];  Service: Cardiovascular;  Laterality: Left;  SFA   PERIPHERAL VASCULAR BALLOON ANGIOPLASTY Right 07/20/2018   Procedure: PERIPHERAL VASCULAR BALLOON ANGIOPLASTY;  Surgeon: [name], MD;  Location: [facility];  Service: Cardiovascular;  Laterality: Right;  SFA WITH DRUG COATED    PERIPHERAL VASCULAR INTERVENTION Left 12/30/2018   Procedure: PERIPHERAL VASCULAR INTERVENTION;  Surgeon: [name], MD;  Location: [facility];  Service: Cardiovascular;  Laterality: Left;  SFA    Assessment & Plan Clinical Impression: Patient is a 60 y.o. year old male with history of PAD, T2DM, family history of soft tissue tumor, pyogenic arthritis of right knee due to pseudomonas s/p I & D and IV antibiotics but continued to have elevated inflammatory markers and follow up MRI showing  osteomyelitis. Dr. Sharol Given recommended AKA 2/22 for definitive treatment  after diabetes was better controlled (patient with elevated A1C but stopped taking meds).  Once medically optimized, he was cleared to undergo R-AKA on 03/16/21. 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 Dr. Sharol Given plans to refer him to [facility] for follow up and  patient preferred to discuss this with his son.  Wound VAC remains in place. Therapy has been ongoing and patient limited by weakness and R-AKA. CIR recommended due to functional decline.    Patient currently requires min with mobility secondary to muscle weakness, impaired timing and sequencing, unbalanced muscle activation, and decreased coordination, and decreased standing balance, decreased postural control, and decreased balance strategies.  Prior to hospitalization, patient was supervision with mobility and lived with Alone in a [place] home.  Home access is 2 flightsStairs to enter.  Patient will benefit from skilled PT intervention to maximize safe functional mobility, minimize fall risk, and decrease caregiver burden for planned discharge home with intermittent assist.  Anticipate patient will benefit from follow up [facility] at discharge.  PT - End of Session Activity Tolerance: Tolerates 30+ min activity with multiple rests Endurance Deficit: Yes Endurance Deficit Description: Requires frequent rest breaks PT Assessment Rehab Potential (ACUTE/IP ONLY): Good PT Barriers to Discharge: [place] home environment;Decreased caregiver support;Home environment access/layout;Wound Care;Lack of/limited family support;Weight bearing restrictions PT Barriers to Discharge Comments: Pt must ascend 2 flights of stairs to enter apartment. Required assistance prior to amputation. Son is available to help prn, but works during day and has 3 kids. PT Patient demonstrates impairments in the following area(s):  Balance;Behavior;Endurance;Motor;Pain;Safety;Skin Integrity (Pt very impulsive) PT Transfers Functional Problem(s): Bed Mobility;Bed to Chair;Car;Furniture PT Locomotion Functional Problem(s): Ambulation;Wheelchair Mobility;Stairs PT Plan PT Intensity: Minimum of 1-2 x/day ,45 to 90 minutes PT Frequency: 5 out of 7 days PT Duration Estimated Length of Stay: 7-10 days PT Treatment/Interventions: Ambulation/gait training;Community reintegration;DME/adaptive equipment instruction;Neuromuscular re-education;Stair training;UE/LE Strength taining/ROM;Wheelchair propulsion/positioning;Balance/vestibular training;Discharge planning;Pain management;Skin care/wound management;Therapeutic Activities;UE/LE Coordination activities;Functional mobility training;Patient/family education;Therapeutic Exercise PT Transfers Anticipated Outcome(s): Supervision w/LRAD PT Locomotion Anticipated Outcome(s): Supervision w/LRAD PT Recommendation Follow Up Recommendations: Home health PT (Pt unable to drive. Son not available 24/7) Patient destination: Home (To son's house) Equipment Recommended: To be determined  PT Evaluation Precautions/Restrictions Precautions Precautions: Fall Precaution Comments: R AKA Restrictions Weight Bearing Restrictions: Yes RLE Weight Bearing: Non weight bearing Pain Pain Assessment Pain Scale: 0-10 Pain Score: 2  Pain Type: Phantom pain Pain Location: Leg Pain Orientation: Right Pain Descriptors / Indicators: Aching;Pressure;Sore Pain Onset: Gradual Patients Stated Pain Goal: 0 Pain Intervention(s): Repositioned;Other (Comment);Medication (See eMAR) (Desensitization) Home Living/Prior Functioning Home Living Living Arrangements: Alone Available Help at Discharge: Family;Available PRN/intermittently (Son lives close by and is able to check on pt throughout day) Type of Home: Apartment Home Access: Stairs to enter [employer] of Steps: 2 flights Entrance  Stairs-Rails: Right;Left (Uses L rail and crutch on R side. Son assists) Home Layout: One level Bathroom Shower/Tub: Tub/shower unit (Has grab bars. Son assists) Biochemist, clinical: Standard Bathroom Accessibility: Yes Additional Comments: Owns a WC and crutches. States he did not use WC  Lives With: Alone Prior Function Level of Independence: Needs assistance with gait;Needs assistance with tranfers;Needs assistance with ADLs;Requires assistive device for independence  Able to Take Stairs?: Yes (Requires assistance) Driving: Yes Vocation: Unemployed Leisure: Hobbies-yes (Comment) Comments: Likes to fish Cognition  Overall Cognitive Status: Within Functional Limits for tasks assessed Arousal/Alertness: Awake/alert Orientation Level: Oriented X4 Safety/Judgment: Appears intact Sensation Sensation Light Touch: Appears Intact Proprioception: Appears Intact Additional Comments: Of LLE Coordination Gross Motor Movements are Fluid and Coordinated: No (Impaired 2/2 R AKA) Finger Nose Finger Test: [facility] Heel Shin Test: NT 2/2 R AKA Motor  Motor Motor: Other (comment) Motor - Skilled Clinical Observations: R AKA, global LE weakness 2/2 pain   Trunk/Postural Assessment  Cervical Assessment Cervical Assessment: Exceptions to [facility] (Forward head) Thoracic Assessment Thoracic Assessment: Exceptions to [facility] (Kyphotic - increases w/RW) Lumbar Assessment Lumbar Assessment: Exceptions to [facility] (posterior pelvic tilt in sitting) Postural Control Postural Control: Deficits on evaluation Protective Responses: Heavy reliance on UE for standing w/RW  Balance Balance Balance Assessed: Yes Static Sitting Balance Static Sitting - Balance Support: Feet supported;Bilateral upper extremity supported Static Sitting - Level of Assistance: 5: Stand by assistance Dynamic Sitting Balance Dynamic Sitting - Balance Support: Feet supported Dynamic Sitting - Level of Assistance: 5: Stand by assistance Dynamic  Sitting - Balance Activities: Forward lean/weight shifting;Lateral lean/weight shifting Static Standing Balance Static Standing - Balance Support: Bilateral upper extremity supported Static Standing - Level of Assistance: 4: Min assist Dynamic Standing Balance Dynamic Standing - Balance Support: Bilateral upper extremity supported Dynamic Standing - Level of Assistance: 4: Min assist Extremity Assessment  RLE Assessment RLE Assessment: Exceptions to [facility] RLE Strength Right Hip Flexion: 3/5 (Did not add overpressure 2/2 R AKA) LLE Assessment LLE Assessment: Exceptions to [facility] LLE Strength Left Hip Flexion: 4-/5 Left Hip ABduction: 4/5 Left Hip ADduction: 4/5 Left Knee Flexion: 4/5 Left Knee Extension: 4/5 Left Ankle Dorsiflexion: 4+/5 Left Ankle Plantar Flexion: 4+/5  Care Tool Care Tool Bed Mobility Roll left and right activity   Roll left and right assist level: Contact Guard/Touching assist    Sit to lying activity   Sit to lying assist level: Contact Guard/Touching assist    Lying to sitting edge of bed activity   Lying to sitting edge of bed assist level: Contact Guard/Touching assist     Care Tool Transfers Sit to stand transfer   Sit to stand assist level: Minimal Assistance - Patient > 75% (w/RW. Pt very impulsive)    Chair/bed transfer   Chair/bed transfer assist level: Minimal Assistance - Patient > 75% (w/RW)     Physiological scientist transfer assist level: Minimal Assistance - Patient > 75% (RW.)      Care Tool Locomotion Ambulation Ambulation activity did not occur: Safety/medical concerns (Fatigue, impulsiveness)        Walk 10 feet activity Walk 10 feet activity did not occur: Safety/medical concerns (Fatigue, impulsiveness)       Walk 50 feet with 2 turns activity Walk 50 feet with 2 turns activity did not occur: Safety/medical concerns (Fatigue, impulsiveness)      Walk 150 feet activity Walk 150 feet activity did not  occur: Safety/medical concerns (Fatigue, impulsiveness)      Walk 10 feet on uneven surfaces activity Walk 10 feet on uneven surfaces activity did not occur: Safety/medical concerns (Fatigue, impulsiveness)      Stairs Stair activity did not occur: Safety/medical concerns (Fatigue, impulsiveness, LE weakness)        Walk up/down 1 step activity Walk up/down 1 step or curb (drop down) activity did not occur: Safety/medical concerns (Fatigue, impulsiveness)      Walk up/down 4 steps activity Walk up/down 4 steps  activity did not occuR: Safety/medical concerns (Fatigue, impulsiveness)    Walk up/down 12 steps activity Walk up/down 12 steps activity did not occur: Safety/medical concerns (Fatigue, impulsiveness)      Pick up small objects from floor Pick up small object from the floor (from standing position) activity did not occur: Safety/medical concerns (Fatigue, impulsiveness)      Wheelchair Will patient use wheelchair at discharge?: Yes Type of Wheelchair: Manual   Wheelchair assist level: Contact Guard/Touching assist Max wheelchair distance: 100'  Wheel 50 feet with 2 turns activity   Assist Level: Contact Guard/Touching assist  Wheel 150 feet activity Wheelchair 150 feet activity did not occur: Safety/medical concerns (Fatigue, impulsiveness)      Refer to Care Plan for Long Term Goals  SHORT TERM GOAL WEEK 1 PT Short Term Goal 1 (Week 1): STG = LTG due to LOS  Recommendations for other services: None   Skilled Therapeutic Intervention Evaluation completed (see details above and below) with education on PT POC and goals and individual treatment initiated with focus on transfers, functional mobility, WC management, pain management, positioning, gait training, stair training, and reducing fall risk. Pt educated on CIR policies and therapy schedule/3 hour. Pt received supine in bed. Reported 2/10 pain in R residual limb, described it as "phantom-like" and reports it gets  worse with positional changes. Asked pt if he would prefer to have an interpreter, pt declined. Educated pt on phantom pain and desensitization techniques to help alleviate it. Pt refused pain meds as he is afraid of becoming addicted to them. Pt demonstrated supine <> sit EOB w/CGA and use of bedrails. Pt scooted hips towards EOB w/CGA. Sit <> stand w/RW and min A for trunk support and steadying of RW. Noted pt pulls RW towards him to stand, provided VC to push off from bed but pt adamant that his technique is best due to "atrophy in L leg". Stand pivot from EOB to [facility] w/min A for trunk support and steadying of walker. Pt relies very heavily on Ues to stand/pivot, as his L foot was not on ground when static standing. Pt impulsive and did not reach for WC prior to sitting. Noted he flexed trunk forward into RW and used tricep strength to flop into chair. Provided min A to guide hips into WC. Pt self-propelled himself in WC from room to ortho gym (~100') w/ CGA and verbal cues for hand placement. Cues also provided to assist with turning, but pt prefers to grab door frame to pull himself through doorway. Car transfer w/RW and min A for guidance of hips into and out of car. Provided cues for pt to reach for car prior to sitting as he almost sat in floor rather than car. Pt does not respond to verbal cues well, unsure if due to language barrier or pt's adamancy to do things his way. Pt self-propelled back to his room w/CGA and more verbal cues for hand placement on rims and technique which were ignored. Pt was left sitting in WC in room with posey belt on and all needs in reach. Pt informed therapist that he will stand up if he needs to use bathroom/change positions on his own. Informed pt he can not stand up without assistance and educated him on use of call bell. Nurse notified of pt's comments and impulsiveness. Safety plan updated.   Mobility Bed Mobility Bed Mobility: Supine to Sit;Sitting - Scoot to Edge of  Bed Supine to Sit: Contact Guard/Touching assist Sitting - Scoot  to Edge of Bed: Contact Guard/Touching assist Transfers Transfers: Sit to Stand;Stand to Sit;Stand Pivot Transfers Sit to Stand: Minimal Assistance - Patient > 75% (RW) Stand to Sit: Minimal Assistance - Patient > 75% (Uncontrolled lowering) Stand Pivot Transfers: Minimal Assistance - Patient > 75% (RW) Stand Pivot Transfer Details: Verbal cues for sequencing;Verbal cues for gait pattern;Verbal cues for technique;Verbal cues for safe use of DME/AE;Verbal cues for precautions/safety Stand Pivot Transfer Details (indicate cue type and reason): Pt relies heavily on UEs Transfer (Assistive device): Rolling walker Locomotion  Gait Ambulation: No Gait Gait: No Stairs / Additional Locomotion Stairs: No Wheelchair Mobility Wheelchair Mobility: Yes Wheelchair Assistance: Development worker, international aid: Both upper extremities Wheelchair Parts Management: Needs assistance   Discharge Criteria: Patient will be discharged from PT if patient refuses treatment 3 consecutive times without medical reason, if treatment goals not met, if there is a change in medical status, if patient makes no progress towards goals or if patient is discharged from hospital.  The above assessment, treatment plan, treatment alternatives and goals were discussed and mutually agreed upon: by patient  [place] E Lynae Pederson Cruzita Lederer Zulema Pulaski, PT, DPT  03/23/2021, 12:31 PM

## 2021-03-23 NOTE — Progress Notes (Signed)
Inpatient Rehabilitation Care Coordinator Assessment and Plan Patient Details  Name: Stanley Chaney MRN: [id number] Date of Birth: 17-Aug-1961  Today's Date: 03/23/2021  Hospital Problems: Principal Problem:   Above-knee amputation [facility]) Active Problems:   Diabetes mellitus ([place])   Liposarcoma ([place])  Past Medical History:  Past Medical History:  Diagnosis Date   Arthritis of right knee 07/19/2020   Diabetes mellitus without complication (HCC)    PAD (peripheral artery disease) ([place])    Rheumatoid factor positive 07/21/2020   Vitamin D deficiency 07/21/2020   Past Surgical History:  Past Surgical History:  Procedure Laterality Date   ABDOMINAL AORTOGRAM W/LOWER EXTREMITY Bilateral 06/15/2018   Procedure: ABDOMINAL AORTOGRAM W/LOWER EXTREMITY;  Surgeon: [name], MD;  Location: [place] [facility];  Service: Cardiovascular;  Laterality: Bilateral;   ABDOMINAL AORTOGRAM W/LOWER EXTREMITY Left 12/30/2018   Procedure: ABDOMINAL AORTOGRAM W/LOWER EXTREMITY;  Surgeon: [name], MD;  Location: [place] [facility];  Service: Cardiovascular;  Laterality: Left;   ABDOMINAL AORTOGRAM W/LOWER EXTREMITY N/A 07/10/2020   Procedure: ABDOMINAL AORTOGRAM W/LOWER EXTREMITY;  Surgeon: [name], MD;  Location: [place] [facility];  Service: Cardiovascular;  Laterality: N/A;  Bilateral   AMPUTATION Right 03/16/2021   Procedure: RIGHT ABOVE KNEE AMPUTATION;  Surgeon: [name], MD;  Location: [place];  Service: Orthopedics;  Laterality: Right;   I & D EXTREMITY Left 06/12/2018   Procedure: Excisional Debridement left foot;  Surgeon: [name], MD;  [facility];  Service: Orthopedics;  Laterality: Left;   IRRIGATION AND DEBRIDEMENT KNEE Right 07/18/2020   Procedure: IRRIGATION AND DEBRIDEMENT RIGHT KNEE;  Surgeon: Altamese [place], MD;  Location: [place];  Service: Orthopedics;  Laterality: Right;   KNEE ARTHROSCOPY Right 07/18/2020   Procedure:  ARTHROSCOPY KNEE SYNOVECTOMY;  Surgeon: Altamese [place], MD;  Location: [place];  Service: Orthopedics;  Laterality: Right;   LOWER EXTREMITY ANGIOGRAM Right 07/20/2018     Right lower extremity angiogram   LOWER EXTREMITY ANGIOGRAPHY Right 07/20/2018   Procedure: Lower Extremity Angiography;  Surgeon: [name], MD;  Location: [place] [facility];  Service: Cardiovascular;  Laterality: Right;   PERIPHERAL VASCULAR ATHERECTOMY Left 06/15/2018   Procedure: PERIPHERAL VASCULAR ATHERECTOMY;  Surgeon: [name], MD;  Location: [place] [facility];  Service: Cardiovascular;  Laterality: Left;  SFA   PERIPHERAL VASCULAR BALLOON ANGIOPLASTY Left 06/15/2018   Procedure: PERIPHERAL VASCULAR BALLOON ANGIOPLASTY;  Surgeon: [name], MD;  Location: [place] [facility];  Service: Cardiovascular;  Laterality: Left;  SFA   PERIPHERAL VASCULAR BALLOON ANGIOPLASTY Right 07/20/2018   Procedure: PERIPHERAL VASCULAR BALLOON ANGIOPLASTY;  Surgeon: [name], MD;  Location: [place] [facility];  Service: Cardiovascular;  Laterality: Right;  SFA WITH DRUG COATED    PERIPHERAL VASCULAR INTERVENTION Left 12/30/2018   Procedure: PERIPHERAL VASCULAR INTERVENTION;  Surgeon: [name], MD;  Location: [place] [facility];  Service: Cardiovascular;  Laterality: Left;  SFA   Social History:  reports that he quit smoking about 2 years ago. His smoking use included cigarettes. He has never used smokeless tobacco. He reports previous alcohol use of about 18.0 standard drinks of alcohol per week. He reports that he does not use drugs.  Family / Support Systems Marital Status: Widow/Widower Patient Roles: Parent Children: Andrey-son [phone] Other Supports: Daughter in-law Anticipated Caregiver: Andrey Ability/Limitations of Caregiver: Son works and can provide intermittent assist-helped with stairs prior to admission Caregiver Availability:  Intermittent Family Dynamics: Close with son and  daughter in-law along with his grandchildren. He is very independent and self sufficent and wants to remain so. He has a few neighbors who wave hello  Social History Preferred language: [place] Religion: Non-Denominational Cultural Background: From [place] speaks English Education: HS Read: Yes Write: Yes Employment Status: Unemployed Public relations account executive Issues: No issues Guardian/Conservator: None-according to MD pt is capable of making his own decisions while here   Abuse/Neglect Abuse/Neglect Assessment Can Be Completed: Yes Physical Abuse: Denies Verbal Abuse: Denies Sexual Abuse: Denies Exploitation of patient/patient's resources: Denies Self-Neglect: Denies  Emotional Status Pt's affect, behavior and adjustment status: Pt is motivated to recover and regain his independence, he has always been and plans to be again. He used crutches for stairs to apartment prior to admission with son's help Recent Psychosocial Issues: other health issues Psychiatric History: No issues-deferred depression screen due to coping appropriately and verbalizing his concerns. Substance Abuse History: No issues-quit tobacco and socialy drinks  Patient / Family Perceptions, Expectations & Goals Pt/Family understanding of illness & functional limitations: Pt is able to explain his amputation and tumor he will need to follow up with. He talks with the MD daily and feels he has a good understanding of his treatment plan going forward. Premorbid pt/family roles/activities: father, grandfather, neighbor, etc Anticipated changes in roles/activities/participation: resume Pt/family expectations/goals: Pt states: " I will take care of myself when I leave here that is my plan." He will talk with his son does not want social worker to talk with him  [employer]: None Premorbid Home Care/DME Agencies: Other (Comment) (has crutches  and rw from previous admit) Transportation available at discharge: Son  Discharge Planning Living Arrangements: Alone Support Systems: Children, Other relatives Type of Residence: Private residence Insurance Resources: Government social research officer (medicaid potential) Museum/gallery curator Resources: Family Support, Social Security Financial Screen Referred: Yes Living Expenses: Rent Money Management: Patient Does the patient have any problems obtaining your medications?: No Home Management: Patient Patient/Family Preliminary Plans: Return home to his third floor apartment until his son's basement is complete for him to move in with them. He was using crutches for the stairs and his son assisting prior to admission. Pt is high levle and will not be here long so basement will not be completed prior to his discharging home. Care Coordinator Barriers to Discharge: Decreased caregiver support, Insurance for SNF coverage Care Coordinator Anticipated Follow Up Needs: HH/OP  Clinical Impression Pleasant gentleman who prides himself on his independence and plans to be again once discharge from rehab. His son and daughter in-law will assist but both work. Son is renovating his basement for pt and he will move in once completed. Work on discharge needs and await team's evaluations.  Elease Hashimoto 03/23/2021, 9:48 AM

## 2021-03-23 NOTE — Progress Notes (Signed)
Inpatient Rehabilitation  Patient information reviewed and entered into eRehab system by Danai Gotto M. Brenton Joines, M.A., CCC/SLP, PPS Coordinator.  Information including medical coding, functional ability and quality indicators will be reviewed and updated through discharge.    

## 2021-03-23 NOTE — Evaluation (Signed)
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[place]) Active Problems:   Diabetes mellitus ([place])   Liposarcoma ([place])   Past Medical History:  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   Vitamin D deficiency 07/21/2020   Past Surgical History:  Past Surgical History:  Procedure Laterality Date   ABDOMINAL AORTOGRAM W/LOWER EXTREMITY Bilateral 06/15/2018   Procedure: ABDOMINAL AORTOGRAM W/LOWER EXTREMITY;  Surgeon: [name], MD;  Location: [place] [facility];  Service: Cardiovascular;  Laterality: Bilateral;   ABDOMINAL AORTOGRAM W/LOWER EXTREMITY Left 12/30/2018   Procedure: ABDOMINAL AORTOGRAM W/LOWER EXTREMITY;  Surgeon: [name], MD;  Location: [place] [facility];  Service: Cardiovascular;  Laterality: Left;   ABDOMINAL AORTOGRAM W/LOWER EXTREMITY N/A 07/10/2020   Procedure: ABDOMINAL AORTOGRAM W/LOWER EXTREMITY;  Surgeon: [name], MD;  Location: [place] [facility];  Service: Cardiovascular;  Laterality: N/A;  Bilateral   AMPUTATION Right 03/16/2021   Procedure: RIGHT ABOVE KNEE AMPUTATION;  Surgeon: [name], MD;  Location: [place];  Service: Orthopedics;  Laterality: Right;   I & D EXTREMITY Left 06/12/2018   Procedure: Excisional Debridement left foot;  Surgeon: [name], MD;  [facility];  Service: Orthopedics;  Laterality:  Left;   IRRIGATION AND DEBRIDEMENT KNEE Right 07/18/2020   Procedure: IRRIGATION AND DEBRIDEMENT RIGHT KNEE;  Surgeon: Altamese [place], MD;  Location: [place];  Service: Orthopedics;  Laterality: Right;   KNEE ARTHROSCOPY Right 07/18/2020   Procedure: ARTHROSCOPY KNEE SYNOVECTOMY;  Surgeon: Altamese [place], MD;  Location: [place];  Service: Orthopedics;  Laterality: Right;   LOWER EXTREMITY ANGIOGRAM Right 07/20/2018     Right lower extremity angiogram   LOWER EXTREMITY ANGIOGRAPHY Right 07/20/2018   Procedure: Lower Extremity Angiography;  Surgeon: [name], MD;  Location: [place] [facility];  Service: Cardiovascular;  Laterality: Right;   PERIPHERAL VASCULAR ATHERECTOMY Left 06/15/2018   Procedure: PERIPHERAL VASCULAR ATHERECTOMY;  Surgeon: [name], MD;  Location: [place] [facility];  Service: Cardiovascular;  Laterality: Left;  SFA   PERIPHERAL VASCULAR BALLOON ANGIOPLASTY Left 06/15/2018   Procedure: PERIPHERAL VASCULAR BALLOON ANGIOPLASTY;  Surgeon: [name], MD;  Location: [place] [facility];  Service: Cardiovascular;  Laterality: Left;  SFA   PERIPHERAL VASCULAR BALLOON ANGIOPLASTY Right 07/20/2018   Procedure: PERIPHERAL VASCULAR BALLOON ANGIOPLASTY;  Surgeon: [name], MD;  Location: [place] [facility];  Service: Cardiovascular;  Laterality: Right;  SFA WITH DRUG COATED    PERIPHERAL VASCULAR INTERVENTION Left 12/30/2018   Procedure: PERIPHERAL VASCULAR INTERVENTION;  Surgeon: [name], MD;  Location: [place] [facility];  Service: Cardiovascular;  Laterality: Left;  SFA    Assessment & Plan Clinical Impression: Stanley Chaney is a 60 year old male with history of PAD, T2DM, family history of soft tissue tumor, pyogenic arthritis of right knee due to pseudomonas s/p I & D and IV antibiotics but continued to have elevated inflammatory markers and  follow up MRI showing osteomyelitis. Dr. Sharol Given recommended AKA 2/22  for definitive treatment after diabetes was better controlled (patient with elevated A1C but stopped taking meds).  Once medically optimized, he was cleared to undergo R-AKA on 03/16/21. 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 Dr. Sharol Given plans to refer him to [facility] for follow up and  patient preferred to discuss this with his son.  Wound VAC remains in place. Therapy has been ongoing and patient limited by weakness and R-AKA. CIR recommended due to functional decline.    Patient currently requires min-max with basic self-care skills secondary to muscle weakness, decreased cardiorespiratoy endurance, and decreased standing balance, decreased postural control, and decreased balance strategies.  Prior to hospitalization, patient could complete BADLs with modified independent -Min A.   Patient will benefit from skilled intervention to increase independence with basic self-care skills prior to discharge  home with family .  Anticipate patient will require 24 hour supervision and follow up home health.  OT - End of Session Endurance Deficit: Yes Endurance Deficit Description: Required rest breaks post standing activity OT Assessment Rehab Potential (ACUTE ONLY): Excellent OT Barriers to Discharge: Home environment access/layout;Lack of/limited family support;Weight bearing restrictions OT Barriers to Discharge Comments: ?layout of son's home, per chart basement is to be renovated for pt. Can family provide 24/7 supervision? OT Patient demonstrates impairments in the following area(s): Balance;Safety;Edema;Endurance;Motor;Pain OT Basic ADL's Functional Problem(s): Grooming;Bathing;Dressing;Toileting OT Advanced ADL's Functional Problem(s): Light Housekeeping OT Transfers Functional Problem(s): Toilet;Tub/Shower OT Additional Impairment(s): None OT Plan OT Intensity: Minimum of 1-2 x/day, 45 to  90 minutes OT Frequency: 5 out of 7 days OT Duration/Estimated Length of Stay: 7-10 days OT Treatment/Interventions: Balance/vestibular training;Discharge planning;Pain management;Self Care/advanced ADL retraining;Therapeutic Activities;UE/LE Coordination activities;Therapeutic Exercise;Skin care/wound managment;Patient/family education;Disease mangement/prevention;Functional mobility training;Community reintegration;DME/adaptive equipment instruction;Psychosocial support;Wheelchair propulsion/positioning;UE/LE Strength taining/ROM OT Self Feeding Anticipated Outcome(s): No goal OT Basic Self-Care Anticipated Outcome(s): Supervision OT Toileting Anticipated Outcome(s): Supervision OT Bathroom Transfers Anticipated Outcome(s): Supervision OT Recommendation Recommendations for Other Services:  (none recommended) Patient destination: Home Follow Up Recommendations: 24 hour supervision/assistance Equipment Recommended: To be determined   OT Evaluation Precautions/Restrictions  Precautions Precautions: Fall Precaution Comments: R AKA Restrictions Weight Bearing Restrictions: Yes RLE Weight Bearing: Non weight bearing General   Vital Signs  Pain Pain Assessment Pain Scale: 0-10 Pain Score: 2  Pain Type: Phantom pain Pain Location: Leg Pain Orientation: Right Pain Descriptors / Indicators: Aching;Pressure;Sore Pain Onset: Gradual Patients Stated Pain Goal: 0 Pain Intervention(s): Repositioned;Other (Comment);Medication (See eMAR) (Desensitization) Home Living/Prior [name] expects to be discharged to:: Private residence Living Arrangements: Alone Available Help at Discharge: Family, Available PRN/intermittently (son + his family checked on pt and assisted with ADL/IADLs PTA) Type of Home: Apartment Home Access: Stairs to enter [employer] of Steps: 3 flights Entrance Stairs-Rails: Right, Left (Uses L rail and crutch on R side. Son  assists) Home Layout: One level Bathroom Shower/Tub: Chiropodist: Standard Bathroom Accessibility: Yes Additional Comments: The above home layout information reflects pts apartment, need to verify son's home layout in prep for d/c  Lives With: Alone (lives alone, but per chart plan is to d/c home with son) IADL History Homemaking Responsibilities:  (Pt reported son + family took care of his meals, son also assisted with LB ADLs PTA due to Rt knee pain. Pt did walk his dog in  the yard while using the RW for support) Type of Occupation: Worked 3-4 jobs at a time when he was younger, served in [employer] + worked for [employer] and [place]: Walking his husky Prior Function Level of Independence: Independent with basic ADLs, Requires assistive device for independence, Needs assistance with ADLs  Able to Take Stairs?: Yes (Requires assistance) Driving: Yes Vocation: Unemployed Leisure: Hobbies-yes (Comment) Comments: Likes to fish Vision Baseline Vision/History: Wears glasses Wears Glasses: Reading only Patient Visual Report: No change from baseline Perception  Perception: Within Functional Limits Praxis Praxis: Intact Cognition Overall Cognitive Status: Within Functional Limits for tasks assessed Arousal/Alertness: Awake/alert Orientation Level: Person;Place;Situation Person: Oriented Place: Oriented Situation: Oriented Year: 2022 Month: July Day of Week: Correct Immediate Memory Recall: Blue;Bed;Sock Memory Recall Sock: Without Cue Memory Recall Blue: Without Cue Memory Recall Bed: Without Cue Safety/Judgment: Appears intact Sensation Sensation Light Touch: Appears Intact Proprioception: Appears Intact Additional Comments: Of LLE Coordination Gross Motor Movements are Fluid and Coordinated: No Fine Motor Movements are Fluid and Coordinated: Yes Coordination and Movement Description: Affected by Rt lower limb loss and altered center of gravity  during dynamic standing activity Finger Nose Finger Test: [facility] bilaterally Heel Shin Test: NT 2/2 R AKA Motor  Motor Motor: Other (comment) Motor - Skilled Clinical Observations: Generalized weakness, Rt AKA  Trunk/Postural Assessment  Cervical Assessment Cervical Assessment: Exceptions to [facility] (forward head) Thoracic Assessment Thoracic Assessment: Exceptions to [facility] (kyphotic) Lumbar Assessment Lumbar Assessment: Exceptions to [facility] (posterior pelvic tilt) Postural Control Postural Control: Deficits on evaluation (Deficits secondary to Rt lower limb loss + altered center of mass) Protective Responses: Heavy reliance on UE for standing w/RW  Balance Balance Balance Assessed: Yes Static Sitting Balance Static Sitting - Balance Support: Feet supported;Bilateral upper extremity supported Static Sitting - Level of Assistance: 5: Stand by assistance Dynamic Sitting Balance Dynamic Sitting - Balance Support: Feet supported Dynamic Sitting - Level of Assistance: 5: Stand by assistance (attempting to don his Lt gripper sock) Dynamic Sitting - Balance Activities: Forward lean/weight shifting;Lateral lean/weight shifting Static Standing Balance Static Standing - Balance Support: Bilateral upper extremity supported Static Standing - Level of Assistance: 4: Min assist Dynamic Standing Balance Dynamic Standing - Balance Support: During functional activity;Left upper extremity supported Dynamic Standing - Level of Assistance: 4: Min assist Dynamic Standing - Balance Activities: Lateral lean/weight shifting;Forward lean/weight shifting (completing perihygiene in standig) Extremity/Trunk Assessment RUE Assessment RUE Assessment: Within Functional Limits Active Range of Motion (AROM) Comments: WNL General Strength Comments: 4+/5 grossly LUE Assessment LUE Assessment: Within Functional Limits Active Range of Motion (AROM) Comments: WNL General Strength Comments: 4+/5 grossly  Care Tool Care  Tool Self Care Eating        Oral Care    Oral Care Assist Level: Minimal Assistance - Patient > 75%    Bathing   Body parts bathed by patient: Right arm;Left arm;Abdomen;Chest;Front perineal area;Buttocks;Right upper leg;Left upper leg;Left lower leg;Face   Body parts n/a: Right lower leg Assist Level: Minimal Assistance - Patient > 75% (for balance)    Upper Body Dressing(including orthotics)   What is the patient wearing?: Pull over shirt   Assist Level: Set up assist    Lower Body Dressing (excluding footwear)   What is the patient wearing?: Underwear/pull up;Pants Assist for lower body dressing: Moderate Assistance - Patient 50 - 74%    Putting on/Taking off footwear   What is the patient wearing?: Non-skid slipper socks Assist for footwear: Maximal Assistance - Patient 25 - 49%  Care Tool Toileting Toileting activity Toileting Activity did not occur (Clothing management and hygiene only): N/A (no void or bm)       Care Tool Bed Mobility Roll left and right activity   Roll left and right assist level: Contact Guard/Touching assist    Sit to lying activity   Sit to lying assist level: Contact Guard/Touching assist    Lying to sitting edge of bed activity   Lying to sitting edge of bed assist level: Contact Guard/Touching assist     Care Tool Transfers Sit to stand transfer   Sit to stand assist level: Minimal Assistance - Patient > 75% (w/RW. Pt very impulsive)    Chair/bed transfer   Chair/bed transfer assist level: Minimal Assistance - Patient > 75% (w/RW)     Toilet transfer Toilet transfer activity did not occur: N/A (not assessed due to time constraints)       Care Tool Cognition Expression of Ideas and Wants     Understanding Verbal and Non-Verbal Content     Memory/Recall Ability *first 3 days only      Refer to Care Plan for Long Term Goals  SHORT TERM GOAL WEEK 1 OT Short Term Goal 1 (Week 1): STGs=LTGs due to ELOS  Recommendations  for other services: None    Skilled Therapeutic Intervention Skilled OT session completed with focus on initial evaluation, education on OT role/POC, and establishment of patient-centered goals.   Pt greeted in bed, reporting pain in residual limb and back however manageable without additional interventions. Asked if pt preferred to use the stratus interpretation service with pt opting not to, understood basic instruction given. He participated in bathing/dressing tasks at sit<stand level from EOB using RW. CGA-Min A for sit<stands and dynamic standing balance using RW during LB self care. Setup for UB self care with pt reporting that he is "slow" and did require increased time. Min A to uncap toothpaste but otherwise setup assist for grooming tasks. Min A for stand pivot<recliner using RW at end of session. He remained sitting up with all needs within reach and chair alarm set.   ADL ADL Eating: Not assessed Grooming: Minimal assistance Where Assessed-Grooming: Edge of bed Upper Body Bathing: Setup Where Assessed-Upper Body Bathing: Edge of bed Lower Body Bathing: Minimal assistance Where Assessed-Lower Body Bathing: Edge of bed Upper Body Dressing: Setup Where Assessed-Upper Body Dressing: Edge of bed Lower Body Dressing: Moderate assistance-Max assistance Where Assessed-Lower Body Dressing: Edge of bed Toileting: Not assessed Toilet Transfer: Not assessed Tub/Shower Transfer: Not assessed Mobility  Bed Mobility Bed Mobility: Supine to Sit;Sitting - Scoot to Edge of Bed Supine to Sit: Contact Guard/Touching assist Sitting - Scoot to [employer] of Bed: Contact Guard/Touching assist Transfers Sit to Stand: Minimal Assistance - Patient > 75% (RW) Stand to Sit: Minimal Assistance - Patient > 75% (Uncontrolled lowering)   Discharge Criteria: Patient will be discharged from OT if patient refuses treatment 3 consecutive times without medical reason, if treatment goals not met, if there is a  change in medical status, if patient makes no progress towards goals or if patient is discharged from hospital.  The above assessment, treatment plan, treatment alternatives and goals were discussed and mutually agreed upon: by patient  Skeet Simmer 03/23/2021, 12:46 PM

## 2021-03-23 NOTE — Progress Notes (Signed)
PROGRESS NOTE   Subjective/Complaints:  No problems overnite  ROS- neg CP, SOB, N/V/D  Objective:   No results found. Recent Labs    03/23/21 0537  WBC 5.4  HGB 12.8*  HCT 40.2  PLT 332   Recent Labs    03/23/21 0537  NA 140  K 5.0  CL 101  CO2 29  GLUCOSE 144*  BUN 24*  CREATININE 0.90  CALCIUM 9.9    Intake/Output Summary (Last 24 hours) at 03/23/2021 1002 Last data filed at 03/23/2021 0941 Gross per 24 hour  Intake 238 ml  Output 2825 ml  Net -2587 ml        Physical Exam: Vital Signs Blood pressure (!) 153/64, pulse (!) 57, temperature 98.2 F (36.8 C), temperature source Oral, resp. rate 14, weight 65.2 kg, SpO2 97 %.  General: No acute distress Mood and affect are appropriate Heart: Regular rate and rhythm no rubs murmurs or extra sounds Lungs: Clear to auscultation, breathing unlabored, no rales or wheezes Abdomen: Positive bowel sounds, soft nontender to palpation, nondistended Extremities: No clubbing, cyanosis, or edema Skin: No evidence of breakdown, no evidence of rash Neurologic: Cranial nerves II through XII intact, motor strength is 5/5 in bilateral deltoid, bicep, tricep, grip, Left hip flexor, knee extensors, ankle dorsiflexor and plantar flexor RLE has 3/5 HF Sensory exam normal sensation to light touch and proprioception in bilateral upper and lower extremities Cerebellar exam normal finger to nose to finger as well as heel to shin in bilateral upper and lower extremities Musculoskeletal: RIght AKA with VAC    Assessment/Plan: 1. Functional deficits which require 3+ hours per day of interdisciplinary therapy in a comprehensive inpatient rehab setting. Physiatrist is providing close team supervision and 24 hour management of active medical problems listed below. Physiatrist and rehab team continue to assess barriers to discharge/monitor patient progress toward functional and  medical goals  Care Tool:  Bathing              Bathing assist       Upper Body Dressing/Undressing Upper body dressing        Upper body assist      Lower Body Dressing/Undressing Lower body dressing            Lower body assist       Toileting Toileting    Toileting assist Assist for toileting: Independent with assistive device Assistive Device Comment: Urinal   Transfers Chair/bed transfer  Transfers assist           Locomotion Ambulation   Ambulation assist              Walk 10 feet activity   Assist           Walk 50 feet activity   Assist           Walk 150 feet activity   Assist           Walk 10 feet on uneven surface  activity   Assist           Wheelchair     Assist               Wheelchair  50 feet with 2 turns activity    Assist            Wheelchair 150 feet activity     Assist          Blood pressure (!) 153/64, pulse (!) 57, temperature 98.2 F (36.8 C), temperature source Oral, resp. rate 14, weight 65.2 kg, SpO2 97 %.  Medical Problem List and Plan: 1.  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  Antithrombotics: -DVT/anticoagulation:  Pharmaceutical: Lovenox--added             -antiplatelet therapy: ASA/Plavix 3. Pain Management: Oxycodone prn effective.  4. Mood: LCSW to follow for evaluation and support.              -antipsychotic agents: N/A 5. Neuropsych: This patient is capable of making decisions on his own behalf. 6. Skin/Wound Care: Wound VAC to d/c tomorrow.             --continue Juven, Zinc and Vitamin C to promote wound healing. 7. Fluids/Electrolytes/Nutrition: Monitor I/O. Check lytes in am. 8.  Liposarcoma: To follow up at [facility] for evaluation- pt and son are aware  of dx.  9. T2DM: Hgb A1c-7.9. Monitor BS ac/hs. Use SSI for elevated BS             --continue Metformin and Farziga daily CBG (last 3)  Recent Labs    03/22/21 1638 03/22/21 2101 03/23/21 0549  GLUCAP 115* 138* 134*   Controlled 7/15 10. HTN: Monitor BP tid--continue Lisinopril daily. Vitals:   03/23/21 0548 03/23/21 0551  BP: (!) 160/60 (!) 153/64  Pulse: (!) 59 (!) 57  Resp: 14   Temp: 98.2 F (36.8 C)   SpO2: 97%    Will increase lisinopril in am  11. ABLA: essentially resolved Hgb 12.8 7/15 12. Constipation: will add Senna to colace --daily after supper. LBM just before came today.     LOS: 1 days A FACE TO Napoleon E Kaetlin Bullen 03/23/2021, 10:02 AM

## 2021-03-23 NOTE — IPOC Note (Signed)
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[place])     Functional Problem List: Nursing Bowel, Edema, Medication Management, Safety, Skin Integrity, Endurance  PT Balance, Behavior, Endurance, Motor, Pain, Safety, Skin Integrity (Pt very impulsive)  OT Balance, Safety, Edema, Endurance, Motor, Pain  SLP    TR         Basic ADL's: OT Grooming, Bathing, Dressing, Toileting     Advanced  ADL's: OT Light Housekeeping     Transfers: PT Bed Mobility, Bed to Chair, Car, Manufacturing systems engineer, Metallurgist: PT Ambulation, Emergency planning/management officer, Stairs     Additional Impairments: OT None  SLP        TR      Anticipated Outcomes Item Anticipated Outcome  Self Feeding No goal  Swallowing      Basic self-care  Media planner Transfers Supervision  Bowel/Bladder  manage bowel with mod I assist  Transfers  Supervision w/LRAD  Locomotion  Supervision w/LRAD  Communication     Cognition     Pain  at or below level 4  Safety/Judgment  maintain safety with cues/reminders   Therapy Plan: PT Intensity: Minimum of 1-2 x/day ,45 to 90 minutes PT Frequency: 5 out of 7 days PT Duration Estimated Length of Stay: 7-10 days OT Intensity: Minimum of 1-2 x/day, 45 to 90 minutes OT Frequency: 5 out of 7 days OT Duration/Estimated Length of Stay: 7-10 days     Due to the current state of emergency, patients may not be receiving their 3-hours of Medicare-mandated therapy.   Team Interventions: Nursing Interventions Patient/Family Education, Bowel Management, Pain Management, Skin Care/Wound Management, Discharge Planning, Medication Management, Disease Management/Prevention  PT interventions  Ambulation/gait training, Community reintegration, DME/adaptive equipment instruction, Neuromuscular re-education, Stair training, UE/LE Strength taining/ROM, Wheelchair propulsion/positioning, Training and development officer, Discharge planning, Pain management, Skin care/wound management, Therapeutic Activities, UE/LE Coordination activities, Functional mobility training, Patient/family education, Therapeutic Exercise  OT Interventions Balance/vestibular training, Discharge planning, Pain management, Self Care/advanced ADL retraining, Therapeutic Activities, UE/LE Coordination activities, Therapeutic Exercise, Skin care/wound managment, Patient/family education, Disease mangement/prevention, Functional mobility training, Community reintegration, Engineer, drilling, Psychosocial support, Wheelchair propulsion/positioning, UE/LE Strength taining/ROM  SLP Interventions    TR Interventions    SW/CM Interventions Discharge Planning, Psychosocial Support, Patient/Family Education   Barriers to Discharge MD  Medical stability  Nursing Decreased caregiver support, Home environment access/layout, Wound Care, Medication compliance, Weight bearing restrictions 3rd level apt no elevator, bil rails with RW; son can assist  PT Inaccessible home environment, Decreased caregiver support, Home environment access/layout, Wound Care, Lack of/limited family support, Weight bearing restrictions Pt must ascend 2 flights of stairs to enter apartment. Required assistance prior to amputation. Son is available to help prn, but works during day and has 3 kids.  OT Home environment access/layout, Lack of/limited family support, Weight bearing restrictions ?layout of son's home, per chart basement is to be renovated for pt. Can family provide 24/7 supervision?  SLP      SW Decreased caregiver support, Insurance for SNF coverage     Team Discharge Planning: Destination: PT-Home ,OT- Home , SLP-  Projected  Follow-up: PT-Home health PT (Pt unable to drive. Son not available 24/7), OT-  24 hour supervision/assistance, SLP-  Projected Equipment Needs: PT-To be  determined, OT- To be determined, SLP-  Equipment Details: PT- , OT-  Patient/family involved in discharge planning: PT- Patient,  OT-Patient, SLP-   MD ELOS: 7-10 days Medical Rehab Prognosis:  Excellent Assessment: The patient has been admitted for CIR therapies with the diagnosis of right AKA, thigh sarcoma s/p resection. The team will be addressing functional mobility, strength, stamina, balance, safety, adaptive techniques and equipment, self-care, bowel and bladder mgt, patient and caregiver education, pain mgt, pre-prosthetic education, wound care, community reentry. Goals have been set at supervision for mobility and self-care.   Due to the current state of emergency, patients may not be receiving their 3 hours per day of Medicare-mandated therapy.    Meredith Staggers, MD, FAAPMR     See Team Conference Notes for weekly updates to the plan of care

## 2021-03-23 NOTE — Progress Notes (Signed)
Physical Therapy Session Note  Patient Details  Name: Stanley Chaney MRN: [id number] Date of Birth: February 12, 1961  Today's Date: 03/23/2021 PT Individual Time: 1259-1416 PT Individual Time Calculation (min): 77 min   PT Missed Time: 23 Minutes  Missed Time Reason: Unavailable (Comment);Nursing care (Pt unavailable 2/2 nurse removing wound vac from RLE)  Short Term Goals: Week 1:  PT Short Term Goal 1 (Week 1): STG = LTG due to LOS  Skilled Therapeutic Interventions/Progress Updates:  Pt received supine in bed undergoing RLE shrinker change w/prosthetist. Called pt's son to inquire about DC destination, son reports that pt will be returning to his apartment upon DC. While on phone with son, nurse entered room to remove pt's wound vac which took ~23 minutes.   Emphasis of session on gait training and education of residual limb positioning away from the position of contracture. Pt performed supine <> sit w/CGA for safety. Sit <>stand w/RW and min A due to pt habit of placing both hands on RW to pull himself up. Provided verbal cues for correct hand placement and pt was able to complete sit <> stand w/CGA. Stand pivot to [facility] w/min A for steadying assist. Transported pt to day gym in [facility] total A for time management. Pt ambulated 53ft w/RW and min A for safety. Noted pt relies heavily on UE strength to ambulate, with his L foot barely touching the ground. Stand <>sit with min A as pt did not reach for chair prior to sitting and therapist had to pull hips back to avoid missing seat. Provided verbal cues and education to reach for chair prior to sitting, pt verbalized understanding. Performed sit <>stand w/CGA to RW and ambulated 8ft w/min A for safety. Pt was able to demonstrate safer stand <>sit which required CGA.   Standing hip 3-way (flex/ext/abd) in // bars for improved R glute strength and stretching of R hip. Pt performed 2x10 reps in each direction with BUE support on rails and CGA. Pt compensated  with trunk flex and lateral trunk lean during abduction and extension movements due to glute weakness/pain. Provided verbal cues for correction of movement, but was difficult for pt to follow.   Supine mat exercises for stretching of R hip and improved glute strength. Pt performed 20 single leg glute bridges with 2s isometric hold. Educated patient on supine positioning to stretch R hip and reduce risk of flexion contracture. Pt held supine stretch for 5 minutes. Pt was left sitting on mat table as handoff to OT.   Therapy Documentation Precautions:  Restrictions Weight Bearing Restrictions: Yes RLE Weight Bearing: Non weight bearing Vital Signs: Therapy Vitals Temp: 98.2 F (36.8 C) Temp Source: Oral Pulse Rate: (!) 57 Resp: 14 BP: (!) 153/64 Patient Position (if appropriate): Lying Oxygen Therapy SpO2: 97 % O2 Device: Room Air   Therapy/Group: Individual Therapy  Male Iafrate E Angelina Neece Cruzita Lederer Syvanna Ciolino, PT, DPT  03/23/2021, 7:47 AM

## 2021-03-23 NOTE — Progress Notes (Signed)
[facility] Individual Statement of Services  Patient Name:  Stanley Chaney  Date:  03/23/2021  Welcome to the [facility].  Our goal is to provide you with an individualized program based on your diagnosis and situation, designed to meet your specific needs.  With this comprehensive rehabilitation program, you will be expected to participate in at least 3 hours of rehabilitation therapies Monday-Friday, with modified therapy programming on the weekends.  Your rehabilitation program will include the following services:  Physical Therapy (PT), Occupational Therapy (OT), 24 hour per day rehabilitation nursing, Care Coordinator, Rehabilitation Medicine, Nutrition Services, and Pharmacy Services  Weekly team conferences will be held on Tuesday to discuss your progress.  Your Inpatient Rehabilitation Care Coordinator will talk with you frequently to get your input and to update you on team discussions.  Team conferences with you and your family in attendance may also be held.  Expected length of stay: 7-10 days  Overall anticipated outcome: supervision level  Depending on your progress and recovery, your program may change. Your Inpatient Rehabilitation Care Coordinator will coordinate services and will keep you informed of any changes. Your Inpatient Rehabilitation Care Coordinator's name and contact numbers are listed  below.  The following services may also be recommended but are not provided by the [facility] will be made to provide these services after discharge if needed.  Arrangements include referral to agencies that provide these services.  Your insurance has been verified to be:  Pending medicaid Your primary doctor is:  Janeann Forehand  Pertinent information will be shared with your doctor and your insurance  company.  Inpatient Rehabilitation Care Coordinator:  Ovidio Kin, [place] or Emilia Beck  Information discussed with and copy given to patient by: Elease Hashimoto, 03/23/2021, 9:50 AM

## 2021-03-23 NOTE — Progress Notes (Signed)
Patient ID: Stanley Chaney, male   DOB: [birth year], 60 y.o.   MRN: [id number] Met with the patient to review role of the nurse CM and review therapy schedule,  rehab routine and plan of care. Initiated education on skin care, DM, HTN, and HLD management. Patient noted he lives on a 3rd floor apt and was managing steps with crutches PTA. Plans to return to apt with son coming in to assist. Son managed IV abx PTA for osteomyelitis/infection before AKA. \Patient reported he usually eats only one meal a day provided by son/daughter in law and son leaves groceries in the home. Reviewed need for increased protein and eating regularly to manage DM and wound care. Given handouts, however patient unable to read without glasses. Continue to follow along to discharge to address questions and educational needs and facilitate preparation for discharge. Margarito Liner, RN .

## 2021-03-24 LAB — GLUCOSE, CAPILLARY
Glucose-Capillary: 150 mg/dL — ABNORMAL HIGH (ref 70–99)
Glucose-Capillary: 136 mg/dL — ABNORMAL HIGH (ref 70–99)
Glucose-Capillary: 142 mg/dL — ABNORMAL HIGH (ref 70–99)
Glucose-Capillary: 161 mg/dL — ABNORMAL HIGH (ref 70–99)

## 2021-03-24 NOTE — Progress Notes (Signed)
Occupational Therapy Session Note  Patient Details  Name: Stanley Chaney MRN: [id number] Date of Birth: 12/08/1960  Today's Date: 03/24/2021 OT Individual Time: 1600-1630 OT Individual Time Calculation (min): 30 min    Short Term Goals: Week 1:  OT Short Term Goal 1 (Week 1): STGs=LTGs due to ELOS   Skilled Therapeutic Interventions/Progress Updates:    Pt greeted at time of session reclined in bed agreeable to additional unscheduled OT session. No pain throughout. Stating he wanted to work to improve blood circulation to LE. Ambulated to/from room <> gym with CGA fading to Supervision at times with RW hopping on LLE. In gym, focused on standing balance in // bars per pt request, 1x10 single leg squats, pt performing his own form of modified tricep dips and lower back stretches that he is "used to doing." Ambulating in parallel bars with Supervision before walking to mat with RW same manner. 1x10 sit ups on wedge for core strengthening and therapist holding down LLE. Pt requesting 3# dumbbells for active recovery to perform bicep curls while therapist cleaned surfaces. Ambulated back to room CGA and left EOB with NT for BS check. All needs met and pt very motivated to participate.   Therapy Documentation Precautions:  Precautions Precautions: Fall Precaution Comments: R AKA Restrictions Weight Bearing Restrictions: Yes RLE Weight Bearing: Non weight bearing      Therapy/Group: Individual Therapy  Viona Gilmore 03/24/2021, 4:37 PM

## 2021-03-24 NOTE — Progress Notes (Signed)
Patient adamant to walk claims his left leg is getting cold and needs to walk for circulation claims he has diabetes and needs to protect his leg from being cut off. Patient has no therapy today . RN walked with patient to the gym and back to his room. Patient was happy and asks if he can do it again tonight. Will let night RN be aware, PT/OT made aware that patient need to have at least a therapy on the weekend. Offered to change dressing on his right leg patient refused claims he will do it tomorrow.

## 2021-03-24 NOTE — Progress Notes (Signed)
PROGRESS NOTE   Subjective/Complaints:  Pt reports LBM 2 days ago on day he came, but thinks can go today.  Got VAC off yesterday.  Fine- no other issues  ROS-  Pt denies SOB, abd pain, CP, N/V/C/D, and vision changes   Objective:   No results found. Recent Labs    03/23/21 0537  WBC 5.4  HGB 12.8*  HCT 40.2  PLT 332   Recent Labs    03/23/21 0537  NA 140  K 5.0  CL 101  CO2 29  GLUCOSE 144*  BUN 24*  CREATININE 0.90  CALCIUM 9.9    Intake/Output Summary (Last 24 hours) at 03/24/2021 1251 Last data filed at 03/24/2021 0803 Gross per 24 hour  Intake 600 ml  Output 2925 ml  Net -2325 ml        Physical Exam: Vital Signs Blood pressure (!) 160/77, pulse 63, temperature 97.8 F (36.6 C), temperature source Oral, resp. rate 16, height 5\' 10"  (1.778 m), weight 65.2 kg, SpO2 99 %.   General: awake, alert, appropriate, sitting up in bed; smiling; NAD HENT: conjugate gaze; oropharynx moist CV: regular rate; no JVD Pulmonary: CTA B/L; no W/R/R- good air movement GI: soft, NT, ND, (+)BS Psychiatric: appropriate; interactive Neurological: Ox3- alert  Skin: No evidence of breakdown, no evidence of rash- IV- not being used Neurologic: Cranial nerves II through XII intact, motor strength is 5/5 in bilateral deltoid, bicep, tricep, grip, Left hip flexor, knee extensors, ankle dorsiflexor and plantar flexor RLE has 3/5 HF Sensory exam normal sensation to light touch and proprioception in bilateral upper and lower extremities Cerebellar exam normal finger to nose to finger as well as heel to shin in bilateral upper and lower extremities Musculoskeletal: RIght AKA with Shrinker in place C/D/I dressing     Assessment/Plan: 1. Functional deficits which require 3+ hours per day of interdisciplinary therapy in a comprehensive inpatient rehab setting. Physiatrist is providing close team supervision and 24 hour  management of active medical problems listed below. Physiatrist and rehab team continue to assess barriers to discharge/monitor patient progress toward functional and medical goals  Care Tool:  Bathing    Body parts bathed by patient: Right arm, Left arm, Abdomen, Chest, Front perineal area, Buttocks, Right upper leg, Left upper leg, Left lower leg, Face     Body parts n/a: Right lower leg   Bathing assist Assist Level: Minimal Assistance - Patient > 75% (for balance)     Upper Body Dressing/Undressing Upper body dressing   What is the patient wearing?: Pull over shirt    Upper body assist Assist Level: Set up assist    Lower Body Dressing/Undressing Lower body dressing      What is the patient wearing?: Underwear/pull up     Lower body assist Assist for lower body dressing: Moderate Assistance - Patient 50 - 74%     Toileting Toileting Toileting Activity did not occur (Clothing management and hygiene only): N/A (no void or bm)  Toileting assist Assist for toileting: Independent with assistive device Assistive Device Comment: Urinal   Transfers Chair/bed transfer  Transfers assist     Chair/bed transfer assist level: Moderate Assistance -  Patient 50 - 74%     Locomotion Ambulation   Ambulation assist   Ambulation activity did not occur: Safety/medical concerns (Fatigue, impulsiveness)          Walk 10 feet activity   Assist  Walk 10 feet activity did not occur: Safety/medical concerns (Fatigue, impulsiveness)        Walk 50 feet activity   Assist Walk 50 feet with 2 turns activity did not occur: Safety/medical concerns (Fatigue, impulsiveness)         Walk 150 feet activity   Assist Walk 150 feet activity did not occur: Safety/medical concerns (Fatigue, impulsiveness)         Walk 10 feet on uneven surface  activity   Assist Walk 10 feet on uneven surfaces activity did not occur: Safety/medical concerns (Fatigue,  impulsiveness)         Wheelchair     Assist Will patient use wheelchair at discharge?: Yes Type of Wheelchair: Manual    Wheelchair assist level: Contact Guard/Touching assist Max wheelchair distance: 100'    Wheelchair 50 feet with 2 turns activity    Assist        Assist Level: Contact Guard/Touching assist   Wheelchair 150 feet activity     Assist  Wheelchair 150 feet activity did not occur: Safety/medical concerns (Fatigue, impulsiveness)       Blood pressure (!) 160/77, pulse 63, temperature 97.8 F (36.6 C), temperature source Oral, resp. rate 16, height 5\' 10"  (1.778 m), weight 65.2 kg, SpO2 99 %.  Medical Problem List and Plan: 1.  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  Antithrombotics: -DVT/anticoagulation:  Pharmaceutical: Lovenox--added             -antiplatelet therapy: ASA/Plavix 3. Pain Management: Oxycodone prn effective.  4. Mood: LCSW to follow for evaluation and support.              -antipsychotic agents: N/A 5. Neuropsych: This patient is capable of making decisions on his own behalf. 6. Skin/Wound Care: Wound VAC to d/c tomorrow.             --continue Juven, Zinc and Vitamin C to promote wound healing. 7. Fluids/Electrolytes/Nutrition: Monitor I/O. Check lytes in am. 8.  Liposarcoma: To follow up at [facility] for evaluation- pt and son are aware of dx.  9. T2DM: Hgb A1c-7.9. Monitor BS ac/hs. Use SSI for elevated BS             --continue Metformin and Farziga daily CBG (last 3)  Recent Labs    03/23/21 2059 03/24/21 0619 03/24/21 1131  GLUCAP 138* 150* 136*   7/16- BG's controlled- con't regimen 10. HTN: Monitor BP tid--continue Lisinopril daily. Vitals:   03/23/21 1913 03/24/21 0358  BP: (!) 142/84 (!) 160/77   Pulse: 67 63  Resp: 16 16  Temp: 97.8 F (36.6 C) 97.8 F (36.6 C)  SpO2: 95% 99%   Will increase lisinopril in am   7/16- Lisinopril increased this AM and hasn't kicked in yet- con't to monitor 11. ABLA: essentially resolved Hgb 12.8 7/15 12. Constipation: will add Senna to colace --daily after supper. LBM just before  came today.   7/16- LBM 2 days ago- if no BM today, will increase/give Sorbitol. - usually goes daily.     LOS: 2 days A FACE TO FACE EVALUATION WAS PERFORMED  Chen Saadeh 03/24/2021, 12:51 PM

## 2021-03-25 LAB — GLUCOSE, CAPILLARY
Glucose-Capillary: 133 mg/dL — ABNORMAL HIGH (ref 70–99)
Glucose-Capillary: 136 mg/dL — ABNORMAL HIGH (ref 70–99)
Glucose-Capillary: 175 mg/dL — ABNORMAL HIGH (ref 70–99)
Glucose-Capillary: 169 mg/dL — ABNORMAL HIGH (ref 70–99)

## 2021-03-25 NOTE — Progress Notes (Signed)
Dressing changed done with OT; Intact with minimal bleeding noted.

## 2021-03-25 NOTE — Progress Notes (Signed)
Occupational Therapy Session Note  Patient Details  Name: Stanley Chaney MRN: [id number] Date of Birth: 12-14-1960  Today's Date: 03/25/2021 OT Individual Time: 1000-1057 and 4132-4401 OT Individual Time Calculation (min): 57 min and 57 min  Short Term Goals: Week 1:  OT Short Term Goal 1 (Week 1): STGs=LTGs due to ELOS  Skilled Therapeutic Interventions/Progress Updates:    Pt greeted EOB, pain manageable for tx. Started with amputee education regarding shrinker sock care. Min cues and Min A for doffing shrinker with torso strap so that RN could assess his incision site. Provided pt with a LH mirror so that he could look at his incision with her. Discussed importance of daily residual limb inspection during his ADL routine, signs of improper healing and when to contact MD. Also discussed shrinker sock care as he had blood stains on the one he removed. OT washed sock with cold water + mild soap, laid it flat to dry on the windowsill. Advised pt to do the same at home. Mod A to don shrinker afterwards. He was motivated to use his crutches for mobility today. Agreeable to attempt stairs as he has 2 flights to negotiate to access his apartment. He ambulated using crutches with CGA to the west unit stairwell. Pt ascended + descended 11 stairs, turning at the top of the stairwell with CGA-Min A overall. 1 small LOB when lowering onto the bottommost step using his crutches for standing support, Min A to recover. He then wanted to walk in the parallel bars, performed abdominal and UE exercises using bars. OT educated him on hip extension stretching while using the bars however pt unable to due to flexor tightness and limited lower back ROM + back pain. Transferred to the mat and pt transitioned to prone, unable to perform prone on elbows or prone propped up with hands correctly, compensated due to back limitations. Set pt up with a prone wedge with pt obtaining a little more stretch in the hip, though pt  mostly felt the stretch in his back. OT advised him to lay prone in the daytime during rest to help with hip extension stretching in prep for prosthetic in the future. Pt verbalized understanding. He then ambulated back to the room using crutches, agreeable to attend dance group so pt was escorted there via w/c.   2nd Session 1:1 tx (57 min) Pt greeted in bed, ready to go and agreeable to earlier than scheduled session. He was very motivated to ambulate today. Close supervision for stand pivot<w/c without AD and then pt was escorted to the outdoor patio. Worked on higher level balance challenges and community mobility while in the outdoor setting. He used the RW to ambulate over uneven concrete and up/down small slopes with CGA. While sitting, OT guided pt through yoga-based back stretches including lateral bends, gentle twists, and thoracic flexion/extension. Education provided on flexed posturing that results from w/c, crutch, and walker use, and how posture can contribute to his resting back pain. Advised him to be very mindful of his seated posture at rest to address therapeutically. At end of session pt was returned to the room via w/c and completed stand step transfer back to bed using RW with close supervision assistance. Left him in bed with all needs within reach and bed alarm set.    Therapy Documentation Precautions:  Precautions Precautions: Fall Precaution Comments: R AKA Restrictions Weight Bearing Restrictions: Yes RLE Weight Bearing: Non weight bearing  Pain: pt reported back + Rt residual limb pain to  be manageable during tx sessions without additional interventions   ADL: ADL Eating: Not assessed Grooming: Minimal assistance Where Assessed-Grooming: Edge of bed Upper Body Bathing: Setup Where Assessed-Upper Body Bathing: Edge of bed Lower Body Bathing: Minimal assistance Where Assessed-Lower Body Bathing: Edge of bed Upper Body Dressing: Setup Where Assessed-Upper Body  Dressing: Edge of bed Lower Body Dressing: Moderate assistance, Maximal assistance Where Assessed-Lower Body Dressing: Edge of bed Toileting: Not assessed Toilet Transfer: Not assessed Tub/Shower Transfer: Not assessed     Therapy/Group: Individual Therapy  Orby Tangen A Devani Odonnel 03/25/2021, 12:49 PM

## 2021-03-25 NOTE — Progress Notes (Signed)
Patient woke up and stated his L leg is cold and needs to ambulate to help with circulation. Patient assisted in the hallway with rolling walker/CGA and walked for 15 minutes.

## 2021-03-25 NOTE — Progress Notes (Signed)
Physical Therapy Session Note  Patient Details  Name: Stanley Chaney MRN: [id number] Date of Birth: Jan 14, 1961  Today's Date: 03/25/2021 PT Individual Time: 0102-7253 and 6644-0347 PT Individual Time Calculation (min): 60 min and 40 min  Short Term Goals: Week 1:  PT Short Term Goal 1 (Week 1): STG = LTG due to LOS  Skilled Therapeutic Interventions/Progress Updates:     Session 1: Patient in the bathroom with NT upon PT arrival. Patient alert and agreeable to PT session. Patient denied pain during session, stated "I am ready to exercise!"  Therapeutic Activity: Bed Mobility: Patient performed supine to/from sit and supine to/from prone with mod I for increased time on a mat table. Patient initially uncomfortable in prone, but eventually reported that the position felt good, stated "I have no been in this position in years." Transfers: Patient performed sit to/from stand x4 with RW and supervision and x4 with B crutches and min A progressing to CGA. Provided verbal cues for hand placement on RW x1 and demo and cues for transfers with B crutches in R hand and pushing up/reaching back with L hand.  Gait Training:  Patient ambulated ~120 feet using RW with close supervision and 12 feet, 22 feet, and ~120 feet using B axillary crutches with CGA for safety/balance. Ambulated with hop-to gait pattern on L with decreased foot clearance with crutches, improved with cues and change of crutch height to fit patient for increased leverage to lift with B upper extremities. Patient demonstrates better control with RW, reports he plans to use the RW for household mobility with mod I during the day and crutches for stairs and community ambulation with his son assisting at d/c.  Patient ascended/descended 4x6" steps x2 using R crutch and L rail ascending and L crutch R rail descending to simulate apartment entry with CGA. Performed hop-to gait pattern on L. Provided min cues for technique and sequencing.    Therapeutic Exercise: Patient performed the following exercises with verbal and tactile cues for proper technique. -prone lying x5 min for hip flexor stretch, required ~1 min to bring R hip to the mat table due to hip flexor tightness, patient unable to lift R residual limb off the mat in prone -standing hip extension 2 x10 with cues for isolating hip motion and minimizing trunk flexion -standing hip abd/add 2x10 focused on maintain neutral hip extension as much as able to reduce hip flexor activation  Patient seated EOB at end of session with breaks locked, bed alarm set, and all needs within reach.   Session 2: Patient in bed upon PT arrival. Patient alert and agreeable to PT session. Patient reported 3-4/10 R residual limb pain and back during session, RN made aware. PT provided repositioning, rest breaks, and distraction as pain interventions throughout session. Patient presented with increased fatigue from previous therapy sessions, however, reports "I'm still ready to go!"  Therapeutic Activity: Bed Mobility:Patient performed supine to/from sit with mod I for increased time and supine to/from prone with supervision for management rolling with softer mattress in the bed.  Therapeutic Exercise: Patient performed 1 set of the following exercises, provided handout for HEP (includes those struck through as not performed during session) with verbal and tactile cues for proper technique. Access Code: [email or web address] Supine Gluteal Sets - 2-3 x daily - 7 x weekly - 2 sets - 15 reps Supine Active Straight Leg Raise - 2-3 x daily - 7 x weekly - 2 sets - 15 reps Supine Isometric Hip Adduction with Towel  Roll (AKA) - 2-3 x daily - 7 x weekly - 2 sets - 15 reps Supine Single Leg Bridge with Sound Leg (BKA) - 2-3 x daily - 7 x weekly - 2 sets - 15 reps Sidelying Hip Abduction (AKA) - 2-3 x daily - 7 x weekly - 2 sets - 15 reps Prone Hip Extension with Residual Limb (BKA) - 2-3 x daily - 7 x weekly - 2  sets - 15 reps Prone Press Up On Elbows (start with just prone lying due to hip flexor tightness) - 2 x daily - 7 x weekly - 1 sets - 1 reps - 5-10 min hold  Patient performed 5 knee push-ups and plank hold with L knee down 2x30 sec spontaneously, provided cues for protecting his residual limb and maintaining R hip extension for increased gluteal and hamstring strengthening and hip flexor stretch.  Discussed d/c planning. Reports his son may be able to come in for family education on Wednesday afternoon if needed. States his son assisted him with stairs and mobility prior to admission and "knows what he is doing." Educated on PT goals, d/c recommendations, and POC.   Patient in bed at end of session with breaks locked, bed alarm set, and all needs within reach.   Therapy Documentation Precautions:  Precautions Precautions: Fall Precaution Comments: R AKA Restrictions Weight Bearing Restrictions: Yes RLE Weight Bearing: Non weight bearing    Therapy/Group: Individual Therapy  Stanley Chaney L Stanley Chaney PT, DPT  03/25/2021, 11:40 AM

## 2021-03-26 LAB — GLUCOSE, CAPILLARY
Glucose-Capillary: 158 mg/dL — ABNORMAL HIGH (ref 70–99)
Glucose-Capillary: 122 mg/dL — ABNORMAL HIGH (ref 70–99)
Glucose-Capillary: 145 mg/dL — ABNORMAL HIGH (ref 70–99)
Glucose-Capillary: 133 mg/dL — ABNORMAL HIGH (ref 70–99)

## 2021-03-26 LAB — BASIC METABOLIC PANEL
Anion gap: 9 (ref 5–15)
BUN: 34 mg/dL — ABNORMAL HIGH (ref 8–23)
CO2: 31 mmol/L (ref 22–32)
Calcium: 10 mg/dL (ref 8.9–10.3)
Chloride: 101 mmol/L (ref 98–111)
Creatinine, Ser: 1.22 mg/dL (ref 0.61–1.24)
GFR, Estimated: >60 mL/min (ref >60)
Glucose, Bld: 137 mg/dL — ABNORMAL HIGH (ref 70–99)
Potassium: 4.4 mmol/L (ref 3.5–5.1)
Sodium: 141 mmol/L (ref 135–145)

## 2021-03-26 LAB — CBC
HCT: 42.7 % (ref 39.0–52.0)
Hemoglobin: 13.6 g/dL (ref 13.0–17.0)
MCH: 26.3 pg (ref 26.0–34.0)
MCHC: 31.9 g/dL (ref 30.0–36.0)
MCV: 82.6 fL (ref 80.0–100.0)
Platelets: 379 10*3/uL (ref 150–400)
RBC: 5.17 MIL/uL (ref 4.22–5.81)
RDW: 13.4 % (ref 11.5–15.5)
WBC: 5.5 10*3/uL (ref 4.0–10.5)
nRBC: 0 % (ref 0.0–0.2)

## 2021-03-26 NOTE — Progress Notes (Signed)
Physical Therapy Session Note  Patient Details  Name: Stanley Chaney MRN: [id number] Date of Birth: Jun 18, 1961  Today's Date: 03/26/2021 PT Individual Time: 1500-1610 PT Individual Time Calculation (min): 70 min   Short Term Goals: Week 1:  PT Short Term Goal 1 (Week 1): STG = LTG due to LOS  Skilled Therapeutic Interventions/Progress Updates:    Pt received supine in bed, agreeable to extra therapy session. No complaints of pain. Bed mobility at mod I level with use of bedrail. Sit to stand with CGA to RW. Ambulation to/from therapy gym with RW and CGA for safety. Standing dips and mini-squats in // bars, 2 x 5 reps. Hip 3-way in // bars x 10 reps each direction with verbal and tactile cues for upright trunk with exercises. Sit to supine to prone on mat table at mod I level. Prone hip flexor stretch x 10 min with pillow under RLE to increase stretch. Prone hip ext x 10 reps B with cues for decreased hip rotation. Sidelying R hip abd x 10 reps with manual cues for correct exercise performance. Supine bridges x 10 reps. Pt noted to have increase in sanguinous drainage from residual limb. Returned to patient room and assisted him with changing dressing to residual limb. Pt able to doff limb shrinker independently, requires assist to don new limb shrinker. Pt able to wash soiled limb shrinker in the sink with setup A. Pt left seated in w/c in room with needs in reach in care of NT at end of session.  Therapy Documentation Precautions:  Precautions Precautions: Fall Precaution Comments: R AKA Restrictions Weight Bearing Restrictions: Yes RLE Weight Bearing: Non weight bearing    Therapy/Group: Individual Therapy   Excell Seltzer, PT, DPT, CSRS  03/26/2021, 4:15 PM

## 2021-03-26 NOTE — Progress Notes (Addendum)
PROGRESS NOTE   Subjective/Complaints:  No issues overnite, itching around incision, not asking for pain meds other than tylenol  ROS- neg CP, SOB, N/V/D  Objective:   No results found. No results for input(s): WBC, HGB, HCT, PLT in the last 72 hours.  No results for input(s): NA, K, CL, CO2, GLUCOSE, BUN, CREATININE, CALCIUM in the last 72 hours.   Intake/Output Summary (Last 24 hours) at 03/26/2021 0739 Last data filed at 03/26/2021 0731 Gross per 24 hour  Intake 720 ml  Output 1400 ml  Net -680 ml         Physical Exam: Vital Signs Blood pressure (!) 153/86, pulse (!) 58, temperature (!) 97.5 F (36.4 C), temperature source Oral, resp. rate 16, height 5\' 10"  (1.778 m), weight 65.2 kg, SpO2 99 %.  General: No acute distress Mood and affect are appropriate Heart: Regular rate and rhythm no rubs murmurs or extra sounds Lungs: Clear to auscultation, breathing unlabored, no rales or wheezes Abdomen: Positive bowel sounds, soft nontender to palpation, nondistended Extremities: No clubbing, cyanosis, or edema Skin: No evidence of breakdown, no evidence of rash, incision CDI, stump slightly bulbous, small amt serosang drainage on telfa  Neurologic: Cranial nerves II through XII intact, motor strength is 5/5 in bilateral deltoid, bicep, tricep, grip, Left hip flexor, knee extensors, ankle dorsiflexor and plantar flexor RLE has 3/5 HF Sensory exam normal sensation to light touch and proprioception in bilateral upper and lower extremities Cerebellar exam normal finger to nose to finger as well as heel to shin in bilateral upper and lower extremities Musculoskeletal: RIght AKA with VAC    Assessment/Plan: 1. Functional deficits which require 3+ hours per day of interdisciplinary therapy in a comprehensive inpatient rehab setting. Physiatrist is providing close team supervision and 24 hour management of active medical  problems listed below. Physiatrist and rehab team continue to assess barriers to discharge/monitor patient progress toward functional and medical goals  Care Tool:  Bathing    Body parts bathed by patient: Right arm, Left arm, Abdomen, Chest, Front perineal area, Buttocks, Right upper leg, Left upper leg, Left lower leg, Face     Body parts n/a: Right lower leg   Bathing assist Assist Level: Minimal Assistance - Patient > 75% (for balance)     Upper Body Dressing/Undressing Upper body dressing   What is the patient wearing?: Pull over shirt    Upper body assist Assist Level: Set up assist    Lower Body Dressing/Undressing Lower body dressing      What is the patient wearing?: Underwear/pull up     Lower body assist Assist for lower body dressing: Moderate Assistance - Patient 50 - 74%     Toileting Toileting Toileting Activity did not occur Landscape architect and hygiene only): N/A (no void or bm)  Toileting assist Assist for toileting: Set up assist Assistive Device Comment: Urinal   Transfers Chair/bed transfer  Transfers assist     Chair/bed transfer assist level: Supervision/Verbal cueing Chair/bed transfer assistive device: Programmer, multimedia   Ambulation assist   Ambulation activity did not occur: Safety/medical concerns (Fatigue, impulsiveness)  Assist level: Supervision/Verbal cueing Assistive device: Walker-rolling Max  distance: 120 ft   Walk 10 feet activity   Assist  Walk 10 feet activity did not occur: Safety/medical concerns (Fatigue, impulsiveness)  Assist level: Supervision/Verbal cueing Assistive device: Walker-rolling   Walk 50 feet activity   Assist Walk 50 feet with 2 turns activity did not occur: Safety/medical concerns (Fatigue, impulsiveness)  Assist level: Supervision/Verbal cueing Assistive device: Walker-rolling    Walk 150 feet activity   Assist Walk 150 feet activity did not occur: Safety/medical  concerns (Fatigue, impulsiveness)         Walk 10 feet on uneven surface  activity   Assist Walk 10 feet on uneven surfaces activity did not occur: Safety/medical concerns (Fatigue, impulsiveness)         Wheelchair     Assist Will patient use wheelchair at discharge?: Yes Type of Wheelchair: Manual    Wheelchair assist level: Contact Guard/Touching assist Max wheelchair distance: 100'    Wheelchair 50 feet with 2 turns activity    Assist        Assist Level: Contact Guard/Touching assist   Wheelchair 150 feet activity     Assist  Wheelchair 150 feet activity did not occur: Safety/medical concerns (Fatigue, impulsiveness)       Blood pressure (!) 153/86, pulse (!) 58, temperature (!) 97.5 F (36.4 C), temperature source Oral, resp. rate 16, height 5\' 10"  (1.778 m), weight 65.2 kg, SpO2 99 %.  Medical Problem List and Plan: 1.  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  Antithrombotics: -DVT/anticoagulation:  Pharmaceutical: Lovenox--added             -antiplatelet therapy: ASA/Plavix 3. Pain Management: Oxycodone prn effective.  4. Mood: LCSW to follow for evaluation and support.              -antipsychotic agents: N/A 5. Neuropsych: This patient is capable of making decisions on his own behalf. 6. Skin/Wound Care: Wound VAC to d/c tomorrow.             --continue Juven, Zinc and Vitamin C to promote wound healing. 7. Fluids/Electrolytes/Nutrition: Monitor I/O. Check lytes in am. 8.  Liposarcoma: To follow up at [facility] for evaluation- pt and son are aware of dx.  9. T2DM: Hgb A1c-7.9. Monitor BS ac/hs. Use SSI for elevated BS             --continue Metformin and Farziga daily CBG (last 3)  Recent Labs    03/25/21 1617 03/25/21 2101 03/26/21 0634  GLUCAP 175* 169* 158*    Controlled 7/18 10. HTN: Monitor BP tid--continue Lisinopril daily. Vitals:    03/25/21 2020 03/26/21 0338  BP: 129/71 (!) 153/86  Pulse: 69 (!) 58  Resp: 18 16  Temp: 98.1 F (36.7 C) (!) 97.5 F (36.4 C)  SpO2: 99% 29%  ELevated systolic in am but lisinopril recently increased  11. ABLA: essentially resolved Hgb 12.8 7/15 12. Constipation: will add Senna to colace --daily after supper. LBM just before came today.     LOS: 4 days A FACE TO FACE EVALUATION WAS PERFORMED  Charlett Blake 03/26/2021, 7:39 AM

## 2021-03-26 NOTE — Progress Notes (Signed)
Physical Therapy Session Note  Patient Details  Name: Stanley Chaney MRN: [id number] Date of Birth: 03/31/1961  Today's Date: 03/26/2021 PT Individual Time: 0802-0859; 1401-1430 PT Individual Time Calculation (min): 57 min and 29 min   Short Term Goals: Week 1:  PT Short Term Goal 1 (Week 1): STG = LTG due to LOS  Skilled Therapeutic Interventions/Progress Updates:  Session 1 Pt received supine in bed and reports no pain. Pt dressed UE and LE w/supervision at EOB. Stand pivot transfer w/RW and supervision to [facility]. Pt transported outside in [facility] total A for time management. Pt requested to "sun bathe for vitamin D" for a few minutes outside. Was very insistent on walking by himself and telling therapist to go back inside, as he was "fine to be alone". Educated pt on hospital policies and safety, pt understanding. Pt ambulated 150' on sidewalk w/RW and supervision for safety. Pt then stood for 5 minutes w/RW to "get his tan on" in the sun. Pt requested to sit down on sidewalk rather than WC. Educated pt on safety again and encouraged he sit in his WC instead. Pt agreeable after some hesitation. Pt was transported back to room in [facility] w/total A for time management. Encouraged pt to perform HEP between therapy sessions and reminded him of positional changes to promote R hip extension. Pt was left sitting in WC in room with all needs in reach.   Session 2 Pt received on mat in main gym w/OT for handoff. Stand pivot from mat to [facility] w/RW and supervision. Pt self-propelled in WC from main gym to apartment w/supervision. Practiced floor transfers in apartment for improved functional mobility and safety awareness. Pt demonstrated standing to supine on mat w/supervision. Pt was able to transfer from supine on mat to sitting EOB w/supervision and stated it was "easy" for him. Pt ambulated from apartment to room (~50') w/RW and supervision. Stand pivot from [facility] to bed with no AD w/supervision. Pt was left lying prone  in bed to stretch R hip w/all needs in reach.    Therapy Documentation Precautions:  Precautions Precautions: Fall Precaution Comments: R AKA Restrictions Weight Bearing Restrictions: Yes RLE Weight Bearing: Non weight bearing   Therapy/Group: Individual Therapy  Alexsis Branscom E Salisa Broz Cruzita Lederer Leonette Tischer, PT, DPT  03/26/2021, 8:14 AM

## 2021-03-26 NOTE — Plan of Care (Signed)
  Problem: RH Balance Goal: LTG Patient will maintain dynamic standing balance (PT) Description: LTG:  Patient will maintain dynamic standing balance with assistance during mobility activities (PT) Flowsheets (Taken 03/26/2021 1508) LTG: Pt will maintain dynamic standing balance during mobility activities with:: (LRAD; Updated due to progress) Independent with assistive device    Problem: Sit to Stand Goal: LTG:  Patient will perform sit to stand with assistance level (PT) Description: LTG:  Patient will perform sit to stand with assistance level (PT) Flowsheets (Taken 03/26/2021 1508) LTG: PT will perform sit to stand in preparation for functional mobility with assistance level: (LRAD; Updated due to progress) Independent with assistive device   Problem: RH Bed Mobility Goal: LTG Patient will perform bed mobility with assist (PT) Description: LTG: Patient will perform bed mobility with assistance, with/without cues (PT). Flowsheets (Taken 03/26/2021 1508) LTG: Pt will perform bed mobility with assistance level of: (Updated due to progress) Independent with assistive device    Problem: RH Bed to Chair Transfers Goal: LTG Patient will perform bed/chair transfers w/assist (PT) Description: LTG: Patient will perform bed to chair transfers with assistance (PT). Flowsheets (Taken 03/26/2021 1508) LTG: Pt will perform Bed to Chair Transfers with assistance level: (LRAD; Updated due to progress) Independent with assistive device    Problem: RH Car Transfers Goal: LTG Patient will perform car transfers with assist (PT) Description: LTG: Patient will perform car transfers with assistance (PT). Flowsheets (Taken 03/26/2021 1508) LTG: Pt will perform car transfers with assist:: (LRAD; Updated due to progress) Independent with assistive device    Problem: RH Furniture Transfers Goal: LTG Patient will perform furniture transfers w/assist (OT/PT) Description: LTG: Patient will perform furniture  transfers  with assistance (OT/PT). Flowsheets (Taken 03/26/2021 1508) LTG: Pt will perform furniture transfers with assist:: (LRAD; Updated due to progress) Independent with assistive device    Goal: LTG Patient will ambulate in home environment (PT) Description: LTG: Patient will ambulate in home environment, # of feet with assistance (PT). Flowsheets (Taken 03/26/2021 1508) LTG: Pt will ambulate in home environ  assist needed:: (LRAD; Updated due to progress) Independent with assistive device   Problem: RH Wheelchair Mobility Goal: LTG Patient will propel w/c in controlled environment (PT) Description: LTG: Patient will propel wheelchair in controlled environment, # of feet with assist (PT) Flowsheets (Taken 03/26/2021 1508) LTG: Pt will propel w/c in controlled environ  assist needed:: (Updated due to progress) Set up assist Goal: LTG Patient will propel w/c in home environment (PT) Description: LTG: Patient will propel wheelchair in home environment, # of feet with assistance (PT). Flowsheets Taken 03/26/2021 1508 LTG: Pt will propel w/c in home environ  assist needed:: (Updated due to progress) Set up assist Taken 03/23/2021 1217 LTG: Propel w/c distance in home environment: 63'

## 2021-03-26 NOTE — Progress Notes (Signed)
Occupational Therapy Session Note  Patient Details  Name: Stanley Chaney MRN: [id number] Date of Birth: 28-Jan-1961  Today's Date: 03/26/2021 OT Individual Time: 7579-7282 and 0601-5615 OT Individual Time Calculation (min): 53 min and 57 min   Short Term Goals: Week 1:  OT Short Term Goal 1 (Week 1): STGs=LTGs due to ELOS  Skilled Therapeutic Interventions/Progress Updates:    Session 1: Pt received seated in w/c, agreeable to OT, reporting no pain throughout session. Pt engaged in oral hygiene and sponge bath seated in w/c at sink with set up A (declined LB bathing this session). Stand pivot transfer w/c>bed without RW, only using bed rails with CGA. Education provided on desensitization techniques for residual limb care/preprosthetic training; pt reported he had been doing these techniques as his residual limb frequently itches. Pt engaged in bed level stretching and exercises due to reported tightness in back. Sit<>sup independent. Sup<>prone increased time with min vc's. In prone, pt education on anti-contracture positioning and importance of prone positioning. Pt tolerated prone for ~5 minutes with ability to press up slightly prone on elbows; however, demoed difficulties with trunk EXT. In supine and long sitting, pt engaged in BLE and low back stretches with reported improved ROM/tightness. Pt remained supine, alarm set, call bell in reach, and all immediate needs met.   Session 2: Pt received supine, agreeable to OT, reporting no pain throughout session. RN present for med pass. Bed mobility completed independently. Pt requested to walk with RW to gym for session. Functional ambulation with RW for total of 160' during session at CGA-close spvsn with no rest breaks required. Pt engaged in home kitchen simulation activity and education regarding home set up options, safety, fall prevention, and energy conservation strategies at ambulatory level (close spvsn). Pt gathered 10 items in high and  low cabinets/drawers with CGA when reaching outside BOS in all directions; min vc's for safety with RW when reaching high/low. Pt otherwise navigated kitchen and hallways safely throughout session. Pt engaged in standing tolerance/balance activity for 6+ minutes playing checkers on vertical surface, reaching outside BOS at table top with BUE or unilateral UE support; completed with close spvsn and no vc's for safety. Pt completed ambulatory transfer to therapy mat close spvsn and engaged in core and BUE strengthening activities including modified [place] twists 10x and chest press with 2, 3lb dumb bells - 3 sets of 15x; intermittent rest breaks provided for fatigue. Pt remained seated on therapy mat, handoff to PT, and all immediate needs met.   Therapy Documentation Precautions:  Precautions Precautions: Fall Precaution Comments: R AKA Restrictions Weight Bearing Restrictions: Yes RLE Weight Bearing: Non weight bearing  Pain: Pain Assessment Pain Scale: 0-10 Pain Score: 0-No pain   Therapy/Group: Individual Therapy  Mellissa Kohut 03/26/2021, 12:32 PM

## 2021-03-27 DIAGNOSIS — I739 Peripheral vascular disease, unspecified: Secondary | ICD-10-CM

## 2021-03-27 DIAGNOSIS — E119 Type 2 diabetes mellitus without complications: Secondary | ICD-10-CM

## 2021-03-27 DIAGNOSIS — R7989 Other specified abnormal findings of blood chemistry: Secondary | ICD-10-CM

## 2021-03-27 DIAGNOSIS — C499 Malignant neoplasm of connective and soft tissue, unspecified: Secondary | ICD-10-CM

## 2021-03-27 LAB — GLUCOSE, CAPILLARY
Glucose-Capillary: 134 mg/dL — ABNORMAL HIGH (ref 70–99)
Glucose-Capillary: 141 mg/dL — ABNORMAL HIGH (ref 70–99)
Glucose-Capillary: 128 mg/dL — ABNORMAL HIGH (ref 70–99)
Glucose-Capillary: 139 mg/dL — ABNORMAL HIGH (ref 70–99)

## 2021-03-27 NOTE — Patient Care Conference (Signed)
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 Alger Simons Care Coodinator Present: Dorien Chihuahua, RN, BSN, CRRN;Becky Dupree, LCSW Nurse Present: Dorien Chihuahua, RN PT Present: Magda Kiel, PT OT Present: Other (comment) Hulda Marin, OT) PPS Coordinator present : Gunnar Fusi, SLP     Current Status/Progress Goal Weekly Team Focus  Bowel/Bladder             Swallow/Nutrition/ Hydration             ADL's   Set up UB ADLs, Mod A LB ADLs (primary OT has been unable to see), spvsn transfers and functional ambulation; limited by potential d/c to home alone but has good support from son's familiy who live close by  mod I vs. spvsn depending on d/c location  LB adls, home safety, fall prevention, standing balance, functional transfers, endurance   Mobility   Supervision for transfers and gait.Min A for stairs. Walking up to 150' w/RW  Mod I with gait in home, Sup for stairs  Endurance, safety awareness, stairs   Communication             Safety/Cognition/ Behavioral Observations            Pain             Skin               Discharge Planning:  Home to his apartment until son's renovations complete in his basement. Will need to be safe alone since son and daughter in-law work   Team Discussion: Small drainage from incision with shrinker.  Encourage fluids. MD monitoring labs.  Patient on target to meet rehab goals: yes, currently supervision for transfers and gait. Min assist needed for steps. Requires set up for upper body care and CGA for lower body care.  *See Care Plan and progress notes for long and short-term goals.   Revisions to Treatment Plan:    Teaching Needs: Safety, skin care, medications, steps  Current Barriers to Discharge: Decreased caregiver support, Home enviroment access/layout, and Weight bearing restrictions  Possible Resolutions to Barriers: Family education with son Has DME for discharge     Medical Summary Current Status: right aka, wound looks fine. has some s/s drainage. sarcoma to be followed up as outpt. pain controlled. prerenal azotemia  Barriers to Discharge: Medical stability   Possible Resolutions to Barriers/Weekly Focus: daily wound assessment and treatment, pre-prosthetic education, pain control. volume/nutritional mgt   Continued Need for Acute Rehabilitation Level of Care: The patient requires daily medical management by a physician with specialized training in physical medicine and rehabilitation for the following reasons: Direction of a multidisciplinary physical rehabilitation program to maximize functional independence : Yes Medical management of patient stability for increased activity during participation in an intensive rehabilitation regime.: Yes Analysis of laboratory values and/or radiology reports with any subsequent  need for medication adjustment and/or medical intervention. : Yes   I attest that I was present, lead the team conference, and concur with the assessment and plan of the team.   Dorien Chihuahua B 03/27/2021, 4:37 PM

## 2021-03-27 NOTE — Progress Notes (Signed)
Patient ID: Stanley Chaney, male   DOB: [birth year], 60 y.o.   MRN: [id number] met with pt to discuss team conference goals mod/I level and discharge 7/21. Asked if son could come in for any education prior to discharge, pt felt not necessary he already helps him and he works. He feels he will be here Thursday late at 5:00 pm to pick up and take home. Has all equipment needs HHPT. Will try to find an agency that takes [facility]. Continue to work on discharge needs.

## 2021-03-27 NOTE — Progress Notes (Signed)
Physical Therapy Session Note  Patient Details  Name: Stanley Chaney MRN: [id number] Date of Birth: Oct 25, 1960  Today's Date: 03/27/2021 PT Individual Time: 4010-2725; 3664-4034 PT Individual Time Calculation (min): 53 min and 91 min  Short Term Goals: Week 1:  PT Short Term Goal 1 (Week 1): STG = LTG due to LOS  Skilled Therapeutic Interventions/Progress Updates:  Session 1 Pt received sitting in WC in room. Denies pain and is agreeable to PT. Pt reports disappointment with his inability to walk freely in his room and around hospital. Educated pt on safety and hospital protocol, but pt disagreed. Supine <> sit EOB w/supervision. Sit <> stand w/RW from [place] w/supervision. Stand pivot transfer to [facility] w/supervision. Pt self-propelled in WC from room to outside patio w/supervision. Pt ambulated >247ft on sidewalk outside w/RW and supervision for improved endurance and functional mobility. After sitting rest break for ~5 min, pt ambulated from reflection fountain to [employer] w/RW and supervision. Pt then transported in [facility] to room w/total A 2/2 fatigue. Stand pivot from [facility] to bed w/RW and supervision. Pt was left sitting at EOB w/NT and all needs in reach.   Session 2 Pt received supine in in room. Denies pain but reports he wants to "punish" PT for being 20 minutes later to earlier session. Communicated to pt that his time would be made up during current session, but he very adamant about "punishment". Pt agreeable to PT. Emphasis of session on endurance training, safety awareness, fall prevention and improved ROM of RLE. Supine <>sit EOB w/ supervision. Stand pivot with no Ad from bed  to [facility] with CGA. Pt self-propelled in WC from room to main gym with supervision. In // bars, pt performed 20 L-sits for improved core strength. Mini squats x10 w/BUE support on rail for improved glute and quad strength. Verbal cues to maintain upright posture for quad dominant squat. Pt ambulated from main gym to  ortho gym w/RW and supervision. Stand >sit>prone w/supervision on mat. Pt laid in prone for 10 minutes for R hip flexor stretch. Performed 15 leg lifts of RLE in prone for improved ROM and glute strength. Prone <>supine w/supervision. Supine glute bridges x25 for continued glute strength. Supine dead bugs, alt. 2x20 per side for improved core and hip flexor strength. Verbal cues to keep back flat on mat for improved core activation. Supine <> prone w/supervision. Bird dogs x15 for improved coordination and posterior chain strength. Prone snow angels x10 for shoulder ROM and posterior chain strength. Noted pt had difficulty moving L shoulder. Prone <> sit EOB w/supervision. Sit <> stand and stand pivot w/RW to [facility] w/supervision. Arm bike for 8 minutes on level 9, retro and forward, for improved shoulder ROM. Pt ambulated from ortho gym to room w/RW and supervision. Sit <> supine in bed with supervision. Pt was left in bed in room with all needs in reach.   Therapy Documentation Precautions:  Precautions Precautions: Fall Precaution Comments: R AKA Restrictions Weight Bearing Restrictions: Yes RLE Weight Bearing: Non weight bearing  Vital Signs: Therapy Vitals Temp: 97.9 F (36.6 C) Temp Source: Oral Pulse Rate: (!) 55 Resp: 18 BP: (!) 157/67 Patient Position (if appropriate): Lying Oxygen Therapy SpO2: 95 % O2 Device: Room Air    Therapy/Group: Individual Therapy  Telesa Jeancharles E Rosio Weiss Cruzita Lederer Angeliki Mates, PT, DPT  03/27/2021, 7:36 AM

## 2021-03-27 NOTE — Discharge Instructions (Addendum)
   Inpatient Rehab Discharge Instructions  Stanley Chaney Discharge date and time: 03/28/21   Activities/Precautions/ Functional Status: Activity: no lifting, driving, or strenuous exercise till cleared by MD Diet: diabetic diet Wound Care:  Wash with dial soap and rinse with water, pat dry and apply dry dressing. Contact Dr. Sharol Given if you notice increase in bleeding, drainage, pus, redness or other problems with your wound.    Functional status:  ___ No restrictions     ___ Walk up steps independently ___ 24/7 supervision/assistance   ___ Walk up steps with assistance _X__ Intermittent supervision/assistance  ___ Bathe/dress independently ___ Walk with walker     ___ Bathe/dress with assistance ___ Walk Independently    ___ Shower independently ___ Walk with assistance    _X__ Shower with assistance __X_ No alcohol     ___ Return to work/school ________  Special Instructions: You are getting dehydrated--you need to drink more water or your medications will build up in your system.  Eat protein snack twice a day between meals to promote wound healing.    COMMUNITY REFERRALS UPON DISCHARGE:    Home Health:   PT                  Agency:[facility] Phone:[phone]  Medical Equipment/Items Ordered:HAS ALL NEEDED EQUIPMENT FROM PAST ADMISSIONS                                                 Agency/Supplier:NA   My questions have been answered and I understand these instructions. I will adhere to these goals and the provided educational materials after my discharge from the hospital.  Patient/Caregiver Signature _______________________________ Date __________  Clinician Signature _______________________________________ Date __________  Please bring this form and your medication list with you to all your follow-up doctor's appointments.

## 2021-03-27 NOTE — Progress Notes (Signed)
Occupational Therapy Session Note  Patient Details  Name: Stanley Chaney MRN: [id number] Date of Birth: 06/01/1961  Today's Date: 03/27/2021 OT Individual Time: 2409-7353 OT Individual Time Calculation (min): 55 min    Short Term Goals: Week 1:  OT Short Term Goal 1 (Week 1): STGs=LTGs due to ELOS  Skilled Therapeutic Interventions/Progress Updates:    Pt received supine, agreeable to OT, reporting improved pain from this morning as RN provided pain meds. Bed mobility independent. Sit<>stand close spvsn with RW. Functional ambulation with RW bed>bathroom>w/c at sink (~25') close spvsn-CGA. Toileting 3/3 tasks CGA for clothing management and perihygiene in stance; pt continent of b/b. OT maintained line of sight for toileting at std height toilet with grab bars, but pt stood with RW without OT standing next to pt despite vc's. Education provided on safety/fall prevention. Education provided for showering with incision; covered residual limb for showering using bag and tape; pt verbalizing and demoing understanding. UB dressing set up A; LB dressing close spvsn seated and in stance with grab bars. Donned R sock close spvsn. Showered close spvsn seated on TTB; pt reports has seat for showering at home and always showers with son present for safety. Engaged in grooming (oral hygiene) and home management/laundry task at sink distant spvsn/mod I - washing out shrinker with blood from yesterday (from incision site on residual limb). Pt remained seated in w/c, alarm set, call bell in reach, and all immediate needs met.   Therapy Documentation Precautions:  Precautions Precautions: Fall Precaution Comments: R AKA Restrictions Weight Bearing Restrictions: Yes RLE Weight Bearing: Non weight bearing  Pain: Pain Assessment Pain Scale: 0-10 Pain Score: 0-No pain    Therapy/Group: Individual Therapy  Mellissa Kohut 03/27/2021, 7:41 AM

## 2021-03-27 NOTE — Progress Notes (Signed)
PROGRESS NOTE   Subjective/Complaints:  Some bleeding from incision. Shrinker with blood. Pain levels controlled  ROS: Patient denies fever, rash, sore throat, blurred vision, nausea, vomiting, diarrhea, cough, shortness of breath or chest pain,   headache, or mood change.    Objective:   No results found. Recent Labs    03/26/21 0816  WBC 5.5  HGB 13.6  HCT 42.7  PLT 379   Recent Labs    03/26/21 0816  NA 141  K 4.4  CL 101  CO2 31  GLUCOSE 137*  BUN 34*  CREATININE 1.22  CALCIUM 10.0    Intake/Output Summary (Last 24 hours) at 03/27/2021 0921 Last data filed at 03/27/2021 0716 Gross per 24 hour  Intake 540 ml  Output 1900 ml  Net -1360 ml        Physical Exam: Vital Signs Blood pressure (!) 157/67, pulse (!) 55, temperature 97.9 F (36.6 C), temperature source Oral, resp. rate 18, height 5\' 10"  (1.778 m), weight 65.2 kg, SpO2 95 %.  Constitutional: No distress . Vital signs reviewed. HEENT: EOMI, oral membranes moist Neck: supple Cardiovascular: RRR without murmur. No JVD    Respiratory/Chest: CTA Bilaterally without wheezes or rales. Normal effort    GI/Abdomen: BS +, non-tender, non-distended Ext: no clubbing, cyanosis  Psych: pleasant and cooperative  Skin: moderate serosang drainage on dressing, both shrinkers soiled with blood. Incision itself looks good, well approximated  Neurologic: Cranial nerves II through XII intact, motor strength is 5/5 in bilateral deltoid, bicep, tricep, grip, Left hip flexor, knee extensors, ankle dorsiflexor and plantar flexor RLE has 4-/5 HF. Sensory exam normal.  Musculoskeletal: RIght AKA tender, still swollen somewhat    Assessment/Plan: 1. Functional deficits which require 3+ hours per day of interdisciplinary therapy in a comprehensive inpatient rehab setting. Physiatrist is providing close team supervision and 24 hour management of active medical  problems listed below. Physiatrist and rehab team continue to assess barriers to discharge/monitor patient progress toward functional and medical goals  Care Tool:  Bathing    Body parts bathed by patient: Right arm, Left arm, Chest, Abdomen, Face     Body parts n/a: Right lower leg   Bathing assist Assist Level: Set up assist     Upper Body Dressing/Undressing Upper body dressing   What is the patient wearing?: Pull over shirt    Upper body assist Assist Level: Set up assist    Lower Body Dressing/Undressing Lower body dressing      What is the patient wearing?: Underwear/pull up     Lower body assist Assist for lower body dressing: Moderate Assistance - Patient 50 - 74%     Toileting Toileting Toileting Activity did not occur Landscape architect and hygiene only): N/A (no void or bm)  Toileting assist Assist for toileting: Set up assist Assistive Device Comment: Urinal   Transfers Chair/bed transfer  Transfers assist     Chair/bed transfer assist level: Supervision/Verbal cueing Chair/bed transfer assistive device: Programmer, multimedia   Ambulation assist   Ambulation activity did not occur: Safety/medical concerns (Fatigue, impulsiveness)  Assist level: Supervision/Verbal cueing Assistive device: Walker-rolling Max distance: 150'   Walk 10 feet  activity   Assist  Walk 10 feet activity did not occur: Safety/medical concerns (Fatigue, impulsiveness)  Assist level: Supervision/Verbal cueing Assistive device: Walker-rolling   Walk 50 feet activity   Assist Walk 50 feet with 2 turns activity did not occur: Safety/medical concerns (Fatigue, impulsiveness)  Assist level: Supervision/Verbal cueing Assistive device: Walker-rolling    Walk 150 feet activity   Assist Walk 150 feet activity did not occur: Safety/medical concerns (Fatigue, impulsiveness)         Walk 10 feet on uneven surface  activity   Assist Walk 10 feet on  uneven surfaces activity did not occur: Safety/medical concerns (Fatigue, impulsiveness)   Assist level: Supervision/Verbal cueing Assistive device: Aeronautical engineer Will patient use wheelchair at discharge?: Yes Type of Wheelchair: Manual    Wheelchair assist level: Contact Guard/Touching assist Max wheelchair distance: 100'    Wheelchair 50 feet with 2 turns activity    Assist        Assist Level: Contact Guard/Touching assist   Wheelchair 150 feet activity     Assist  Wheelchair 150 feet activity did not occur:  (Fatigue, impulsiveness)   Assist Level: Moderate Assistance - Patient 50 - 74%   Blood pressure (!) 157/67, pulse (!) 55, temperature 97.9 F (36.6 C), temperature source Oral, resp. rate 18, height 5\' 10"  (1.778 m), weight 65.2 kg, SpO2 95 %.  Medical Problem List and Plan: 1.  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  Antithrombotics: -DVT/anticoagulation:  Pharmaceutical: Lovenox--added             -antiplatelet therapy: ASA/Plavix 3. Pain Management: Oxycodone prn effective.  4. Mood: LCSW to follow for evaluation and support.              -antipsychotic agents: N/A 5. Neuropsych: This patient is capable of making decisions on his own behalf. 6. Skin/Wound Care: wound looks fine. Continue telfa, gauze, kerlix to absorb drainage. Wearing shrinker appropriately             --continue Juven, Zinc and Vitamin C to promote wound healing. 7. Fluids/Electrolytes/Nutrition: encourage po  I personally reviewed the patient's labs today.    -BUN trending up. Push fluids 8.  Liposarcoma: To follow up at [facility] for evaluation- pt and son are aware of dx.  9. T2DM: Hgb A1c-7.9. Monitor BS ac/hs. Use SSI for elevated BS             --continue Metformin and Farziga daily CBG (last 3)  Recent Labs     03/26/21 1609 03/26/21 2031 03/27/21 0630  GLUCAP 145* 133* 134*   Controlled 7/19 10. HTN: Monitor BP tid--continue Lisinopril daily. Vitals:   03/26/21 1954 03/27/21 0417  BP: 112/64 (!) 157/67  Pulse: 72 (!) 55  Resp: 18 18  Temp: 98.2 F (36.8 C) 97.9 F (36.6 C)  SpO2: 96% 95%  SBP still a little high. Observe, no changes today 11. ABLA: essentially resolved Hgb 13.6, likely concentration effect 12. Constipation:  moved bowels yesterday  LOS: 5 days A FACE TO FACE EVALUATION WAS PERFORMED  Meredith Staggers 03/27/2021, 9:21 AM

## 2021-03-28 DIAGNOSIS — R7989 Other specified abnormal findings of blood chemistry: Secondary | ICD-10-CM

## 2021-03-28 LAB — GLUCOSE, CAPILLARY
Glucose-Capillary: 129 mg/dL — ABNORMAL HIGH (ref 70–99)
Glucose-Capillary: 143 mg/dL — ABNORMAL HIGH (ref 70–99)
Glucose-Capillary: 101 mg/dL — ABNORMAL HIGH (ref 70–99)
Glucose-Capillary: 111 mg/dL — ABNORMAL HIGH (ref 70–99)

## 2021-03-28 MED ORDER — OXYCODONE HCL 5 MG PO TABS: 5.0000 mg | ORAL_TABLET | ORAL | Status: DC | PRN

## 2021-03-28 NOTE — Progress Notes (Signed)
Physical Therapy Session Note  Patient Details  Name: Stanley Chaney MRN: [id number] Date of Birth: 1961/07/05  Today's Date: 03/28/2021 PT Individual Time: 0945-1100 PT Individual Time Calculation (min): 75 min   Short Term Goals: Week 1:  PT Short Term Goal 1 (Week 1): STG = LTG due to LOS  Skilled Therapeutic Interventions/Progress Updates:  Pt received supine in bed asleep. Denies pain and is agreeable to PT. Supine <> sit EOB independently. Pt dressed UE and LE independently at EOB. Sit <> stand mod I w/RW. Stand pivot to [facility] w/RW mod I. Pt self-propelled from room to stairway (~43ft) w/set up assist and supervision. Pt ascended/descended 12 steps w/LUE support on rail and RUE on axillary crutch w/CGA. Pt self-propelled another 69' w/supervision to car and performed car transfer mod I w/RW. Pt able to pick up object w/use of reacher and UE support on RW mod I. Pt ascended/descended 4' curb w/RW using retro technique and CGA. Pt ascended/descended ramp w/RW and supervision for safety. Pt ambulated 50' w/RW and mod I to sit edge of mat. Sit > supine >prone independently. Pt held prone hip flexor stretch for 5 minutes with intermittent leg lifts of RLE and propped up on elbows for another 5 minutes. Prone > supine > sit edge of mat independently. Sit <>stand mod I w/RW and ambulated 100' to room w/RW mod I. Sit<> stand and stand pivot transfer from [facility] to bed mod I w/RW. Pt was left supine in bed with all questions answered and all needs in reach.   Therapy Documentation Precautions:  Precautions Precautions: Fall Precaution Comments: R AKA Restrictions Weight Bearing Restrictions: Yes RLE Weight Bearing: Non weight bearing   Therapy/Group: Individual Therapy Cruzita Lederer Tabbatha Bordelon, PT, DPT  03/28/2021, 7:31 AM

## 2021-03-28 NOTE — Discharge Summary (Signed)
Physician Discharge Summary  Patient ID: Stanley Chaney MRN: [id number] DOB/AGE: April 05, 1961 60 y.o.  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  Labs:  Basic Metabolic Panel: BMP Latest Ref Rng & Units 03/29/2021 03/29/2021 03/26/2021  Glucose 70 - 99 mg/dL 119(H) 149(H) 137(H)  BUN 8 - 23 mg/dL 38(H) 40(H) 34(H)  Creatinine 0.61 - 1.24 mg/dL 1.09 1.38(H) 1.22  BUN/Creat Ratio 10 - 24 - - -  Sodium 135 - 145 mmol/L 140 141 141  Potassium 3.5 - 5.1 mmol/L 5.2(H) 5.5(H) 4.4  Chloride 98 - 111 mmol/L 101 101 101  CO2 22 - 32 mmol/L $RemoveB'27 27 31  'PgAjDkuh$ Calcium 8.9 - 10.3 mg/dL 10.0 10.2 10.0     CBC: CBC Latest Ref Rng & Units 03/26/2021 03/23/2021 03/18/2021  WBC 4.0 - 10.5 K/uL 5.5 5.4 8.8  Hemoglobin 13.0 - 17.0 g/dL 13.6 12.8(L) 11.4(L)  Hematocrit 39.0 - 52.0 % 42.7 40.2 35.8(L)  Platelets 150 - 400 K/uL 379 332 270     CBG: Recent Labs  Lab 03/28/21 1136 03/28/21 1653 03/28/21 2058 03/29/21 0628 03/29/21 1148  GLUCAP 143* 101* 111* 138* 114*    Brief HPI:   Stanley Chaney is a 60 y.o. male with history of PAD, T2DM, family history of cardiogenic shock tumor, pyogenic arthritis status post I&D last follow-up treated with IV antibiotics but he continued to have elevated inflammatory markers.  MRI showed osteomyelitis and Dr. 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  He has been referred to [facility] for follow-up.  Therapies were initiated and patient was noted to be limited by weakness as well as right AKA.  CIR was recommended due to functional decline.   Hospital Course: Vickey Ewbank was  admitted to rehab 03/22/2021 for inpatient therapies to consist of PT and OT at least three hours five days a week. Past admission physiatrist, therapy team and rehab RN have worked together to provide customized collaborative inpatient rehab.  His blood pressures were monitored on TID basis and has been reasonably controlled.  His diabetes has been monitored with ac/hs CBG checks and SSI was use prn for tighter BS control.  P.o. intake has been good and blood sugars are reasonably controlled.  Wound VAC was removed and incision is noted to be healing well, intact with staples in place.    Follow-up labs showed acute blood loss anemia is resolving.  Check of electrolytes showed worsening of renal status with hyperkalemia. He was treated with fluid boluses and Lisinopril discontinued due to concerns of medication side effects.  He was advised to increase fluid intake and follow up with PCP for input on alternative antihypertensive. Since removal of wound VAC, he had had some serosanguineous drainage which is decreasing.  Pain has been reasonably controlled with the use of oxycodone on a rare occasions.  He has been educated on desensitizing techniques, wound care as well as importance of medication compliance after discharge.  He has made good gains during his rehab stay and is currently at modified independent level.  He will continue receive follow-up home health PT after discharge.   Rehab course: During patient's stay in rehab weekly team conferences were held  to monitor patient's progress, set goals and discuss barriers to discharge. At admission, patient required min to max assist with ADL tasks and min assist with mobility.He  has had improvement in activity tolerance, balance, postural control as well as ability to compensate for deficits.  He is able to complete ADL tasks at modified independent level.  He requires supervision with showers.  He is modified independent for transfers and is able to  ambulate 50 feet with rolling walker at modified independent level.  He is able to climb 12 steps with rail and axillary crutch with contact-guard assist.  Family education was completed with son.   Discharge disposition: 01-Home or Self Care  Diet: Carb modified  Special Instructions: Monitor BS 2-3 times a day and record. Increase fluid intake.  Recommend repeat BMET in 5-7 days to monitor renal status.   Allergies as of 03/29/2021       Reactions   Lisinopril Other (See Comments)   Hyperkalemia/AKI        Medication List     STOP taking these medications    lisinopril 10 MG tablet Commonly known as: ZESTRIL   Natural Vitamin D-3 125 MCG (5000 UT) Tabs Generic drug: Cholecalciferol   nutrition supplement (JUVEN) Pack       TAKE these medications    acetaminophen 325 MG tablet Commonly known as: TYLENOL Take 1-2 tablets (325-650 mg total) by mouth every 4 (four) hours as needed for mild pain.   ascorbic acid 1000 MG tablet Commonly known as: VITAMIN C Take 1 tablet (1,000 mg total) by mouth daily.   aspirin 81 MG EC tablet Take 1 tablet (81 mg total) by mouth daily.   blood glucose meter kit and supplies Dispense based on patient and insurance preference. Use up to four times daily as directed. (FOR ICD-10 E10.9, E11.9).   clopidogrel 75 MG tablet Commonly known as: PLAVIX Take 1 tablet (75 mg total) by mouth daily with breakfast.   dapagliflozin propanediol 5 MG Tabs tablet Commonly known as: Farxiga Take 1 tablet (5 mg total) by mouth daily before breakfast.   docusate sodium 100 MG capsule Commonly known as: COLACE Take 1 capsule (100 mg total) by mouth daily. Notes to patient: For constipation--OTC   gabapentin 300 MG capsule Commonly known as: NEURONTIN Take 1 capsule (300 mg total) by mouth at bedtime.   metFORMIN 500 MG tablet Commonly known as: GLUCOPHAGE Take 1 tablet (500 mg total) by mouth daily with breakfast.   methocarbamol 500 MG  tablet Commonly known as: ROBAXIN Take 1 tablet (500 mg total) by mouth every 6 (six) hours as needed for muscle spasms.   oxyCODONE 5 MG immediate release tablet--Rx # 10 pills Commonly known as: Oxy IR/ROXICODONE Take 1 tablet (5 mg total) by mouth 2 (two) times daily as needed for severe pain.   pantoprazole 40 MG tablet Commonly known as: PROTONIX Take 1 tablet (40 mg total) by mouth daily.   rosuvastatin 10 MG tablet Commonly known as: Crestor Take 1 tablet (10 mg total) by mouth daily after supper. What changed: when to take this   senna-docusate 8.6-50 MG tablet Commonly known as: Senokot-S Take 1 tablet by mouth daily after supper. Laxative--purchase over the counter   zinc sulfate 220 (50 Zn) MG capsule Take 1 capsule (220 mg total) by mouth daily. Purchase over the counter What changed: additional instructions        Follow-up Information     [name], MD. Call in 1  day(s).   Specialty: Orthopedic Surgery Why: for post op check in next 2 weeks. Contact information: [place] [address] [phone]         Wendie Agreste, MD. Call.   Specialties: Family Medicine, Sports Medicine Contact information: [place] [address] [phone]         Meredith Staggers, MD Follow up.   Specialty: Physical Medicine and Rehabilitation Why: Office will call you with follow up appointment Contact information: [address] [place] [address] [phone]         Magda Bernheim, MD Follow up on 04/10/2021.   Specialty: Orthopedic Surgery Why: Call to  get directions/time of appointment. Contact information: [address] [place] [address] [phone]                 Signed: Bary Leriche 03/29/2021, 4:57 PM

## 2021-03-28 NOTE — Progress Notes (Signed)
Physical Therapy Session Note  Patient Details  Name: Stanley Chaney MRN: [id number] Date of Birth: May 20, 1961  Today's Date: 03/28/2021 PT Individual Time: 1415-1505 PT Individual Time Calculation (min): 50 min   Short Term Goals: Week 1:  PT Short Term Goal 1 (Week 1): STG = LTG due to LOS  Skilled Therapeutic Interventions/Progress Updates:    Patient in supine and reports already to the gym twice today and wishes to go outside for some sun.  Patient performed squat pivot to w/c mod I.  Patient requesting assist with wheelchair.  Assisted in w/c outside.  Patient sit to stand to RW mod I and ambulated outdoor uneven surfaces with S except one LOB on unlevel surface with CGA for safety.  Patient assisted in w/c to fourth floor to ortho gym.  Transfer to mat with mod I and performed sit to supine and supine to prone mod I.  Patient in prone for hip stretching, bilat hip extension x 10 and prone on elbows for more hip flexor stretch.  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  Discussed likely would have much more severe pain like he had in his leg when it was infected.  PAtient supine to sit mod I.  Transferred to w/c mod I.  Propelled to room in w/c mod I.  Patient transferred to bed mod I.  Left with bed alarm active and needs in reach.  Therapy Documentation Precautions:  Precautions Precautions: Fall Precaution Comments: R AKA Restrictions Weight Bearing Restrictions: Yes RLE Weight Bearing: Non weight bearing  Pain: Pain Assessment Pain Scale: Faces Faces Pain Scale: Hurts a little bit Pain Type: Acute pain Pain Location: Hip Pain Orientation: Right;Left Pain Descriptors / Indicators: Tightness Pain Onset: With Activity Pain Intervention(s): Ambulation/increased activity Multiple Pain Sites: No    Therapy/Group: Individual Therapy  Reginia Naas Magda Kiel, PT 03/28/2021, 4:17 PM

## 2021-03-28 NOTE — Plan of Care (Signed)
  Problem: RH Balance Goal: LTG Patient will maintain dynamic standing with ADLs (OT) Description: LTG:  Patient will maintain dynamic standing balance with assist during activities of daily living (OT)  Outcome: Completed/Met   Problem: Sit to Stand Goal: LTG:  Patient will perform sit to stand in prep for activites of daily living with assistance level (OT) Description: LTG:  Patient will perform sit to stand in prep for activites of daily living with assistance level (OT) Outcome: Completed/Met   Problem: RH Grooming Goal: LTG Patient will perform grooming w/assist,cues/equip (OT) Description: LTG: Patient will perform grooming with assist, with/without cues using equipment (OT) Outcome: Completed/Met   Problem: RH Bathing Goal: LTG Patient will bathe all body parts with assist levels (OT) Description: LTG: Patient will bathe all body parts with assist levels (OT) Outcome: Completed/Met   Problem: RH Dressing Goal: LTG Patient will perform lower body dressing w/assist (OT) Description: LTG: Patient will perform lower body dressing with assist, with/without cues in positioning using equipment (OT) Outcome: Completed/Met   Problem: RH Toileting Goal: LTG Patient will perform toileting task (3/3 steps) with assistance level (OT) Description: LTG: Patient will perform toileting task (3/3 steps) with assistance level (OT)  Outcome: Completed/Met   Problem: RH Toilet Transfers Goal: LTG Patient will perform toilet transfers w/assist (OT) Description: LTG: Patient will perform toilet transfers with assist, with/without cues using equipment (OT) Outcome: Completed/Met   Problem: RH Tub/Shower Transfers Goal: LTG Patient will perform tub/shower transfers w/assist (OT) Description: LTG: Patient will perform tub/shower transfers with assist, with/without cues using equipment (OT) Outcome: Completed/Met

## 2021-03-28 NOTE — Progress Notes (Signed)
Wound evaluated--he had some bleeding on kerlex likely from medial aspect. Incision intact with staples in place and minimal edema.  Lateral aspect   Medial aspect

## 2021-03-28 NOTE — Progress Notes (Signed)
Occupational Therapy Discharge Summary  Patient Details  Name: Stanley Chaney MRN: [id number] Date of Birth: Dec 19, 1960  Today's Date: 03/28/2021 OT Individual Time: 2952-8413 OT Individual Time Calculation (min): 75 min    Patient has met 8 of 8 long term goals due to improved activity tolerance, improved balance, postural control, ability to compensate for deficits, improved coordination, and improved safety awareness .  Patient to discharge at overall Modified Independent level from w/c and RW; recommending spvsn for showering and standing level LB ADLs for safety.  Patient's care partner is independent to provide the necessary physical assistance at discharge.    Reasons goals not met: NA  Recommendation:  Patient may benefit from ongoing skilled OT services in home health setting to continue to advance functional skills in the area of BADL, iADL, and Reduce care partner burden. However, pt mod I at d/c for majority of ADLs, has adequate family support and spvsn for more challenging tasks (such as showering), and receives assistance for meals. Therefore, pt may benefit from HHPT>HHOT at d/c for improved strengthening and functional mobility for safe d/c home.   Equipment: No equipment provided  Reasons for discharge: treatment goals met and discharge from hospital  Patient/family agrees with progress made and goals achieved: Yes  OT Discharge Precautions/Restrictions  Precautions Precautions: Fall Precaution Comments: R AKA Restrictions Weight Bearing Restrictions: Yes RLE Weight Bearing: Non weight bearing  Pain Pain Assessment Pain Scale: Faces Faces Pain Scale: Hurts a little bit Pain Type: Acute pain Pain Location: Buttocks Pain Orientation: Left;Right Pain Descriptors / Indicators: Aching;Sore Pain Onset: With Activity Pain Intervention(s): Rest Multiple Pain Sites: No ADL ADL Eating: Independent Where Assessed-Eating: Chair, Wheelchair, Edge of  bed Grooming: Modified independent Where Assessed-Grooming: Sitting at sink, Edge of bed, Chair, Wheelchair Upper Body Bathing: Supervision/safety Where Assessed-Upper Body Bathing: Shower Lower Body Bathing: Supervision/safety Where Assessed-Lower Body Bathing: Shower Upper Body Dressing: Modified independent (Device) Where Assessed-Upper Body Dressing: Wheelchair, Chair, Edge of bed Lower Body Dressing: Modified independent Where Assessed-Lower Body Dressing: Chair, Sitting at sink, Bed level, Edge of bed, Wheelchair Toileting: Supervision/safety Where Assessed-Toileting: Glass blower/designer: Diplomatic Services operational officer Method: Counselling psychologist: Energy manager: Modified independent Clinical cytogeneticist Method: Optometrist: Radio broadcast assistant, Energy manager: Chief Financial Officer Method: Heritage manager: Radio broadcast assistant, Grab bars ADL Comments: LB ADLs completed in standing with RW recommend spvsn at d/c for balance and safety; if completed seated or bed level, pt completes mod I. Vision Baseline Vision/History: Wears glasses Wears Glasses: Reading only Patient Visual Report: No change from baseline Vision Assessment?: No apparent visual deficits Perception  Perception: Within Functional Limits Praxis Praxis: Intact Cognition Overall Cognitive Status: Within Functional Limits for tasks assessed Arousal/Alertness: Awake/alert Orientation Level: Oriented X4 Attention: Selective Selective Attention: Appears intact Memory: Appears intact Awareness: Appears intact Problem Solving: Appears intact Executive Function: Self Correcting Self Correcting: Appears intact Safety/Judgment: Appears intact Comments: Pt occasionally impulsive; but pt and son education on home safety/fall prevention and recommended spvsn levels for stairs, showering, and  standing level LB ADLs. Sensation Sensation Light Touch: Appears Intact Hot/Cold: Appears Intact Proprioception: Appears Intact Stereognosis: Appears Intact Coordination Gross Motor Movements are Fluid and Coordinated: No Fine Motor Movements are Fluid and Coordinated: Yes Coordination and Movement Description: COG affected 2/2 R AKA but improved since baseline Heel Shin Test: NT 2/2 R AKA Motor  Motor Motor: Other (comment below) Motor - Discharge Observations: Generalized weakness 2/2  R AKA but improved since evaluation Mobility  Bed Mobility Bed Mobility: Rolling Right;Rolling Left;Sit to Supine;Sitting - Scoot to [employer] of Bed;Supine to Sit Rolling Right: Independent Rolling Left: Independent Supine to Sit: Independent Sitting - Scoot to [employer] of Bed: Independent Sit to Supine: Independent Transfers Sit to Stand: Independent with assistive device Stand to Sit: Independent with assistive device  Squat pivot transfer: Independent with assistive device Stand pivot transfer: Independent with assistive device Assistive device(s): RW Trunk/Postural Assessment  Cervical Assessment Cervical Assessment: Exceptions to [facility] (forward head) Thoracic Assessment Thoracic Assessment: Exceptions to [facility] (kyphotic) Lumbar Assessment Lumbar Assessment: Exceptions to [facility] (posterior pelvic tilt) Postural Control Postural Control: Deficits on evaluation Balance Balance Balance Assessed: Yes Static Sitting Balance Static Sitting - Balance Support: Feet supported;No upper extremity supported Static Sitting - Level of Assistance: 7: Independent Dynamic Sitting Balance Dynamic Sitting - Balance Support: Feet supported;No upper extremity supported Dynamic Sitting - Level of Assistance: 7: Independent Dynamic Sitting - Balance Activities: Forward lean/weight shifting;Lateral lean/weight shifting;Reaching for objects;Reaching across midline Static Standing Balance Static Standing - Balance  Support: During functional activity;Bilateral upper extremity supported;Left upper extremity supported;Right upper extremity supported Static Standing - Level of Assistance: 6: Modified independent (Device/Increase time) Dynamic Standing Balance Dynamic Standing - Balance Support: During functional activity;Bilateral upper extremity supported;Left upper extremity supported;Right upper extremity supported Dynamic Standing - Level of Assistance: 6: Modified independent (Device/Increase time);5: Stand by assistance Dynamic Standing - Balance Activities: Lateral lean/weight shifting;Forward lean/weight shifting;Reaching for objects;Reaching across midline Extremity/Trunk Assessment RUE Assessment RUE Assessment: Within Functional Limits LUE Assessment LUE Assessment: Within Functional Limits Active Range of Motion (AROM) Comments: slight decreased AROM due to baseline shoulder issue, but [facility]  Skilled Therapeutic Intervention: Pt received supine, son present, agreeable to OT, reporting no pain at start of session. Bed mobility independent. Session focused on family education with son on fall prevention, home safety/set up, DME recommendations, energy conservation strategies, spvsn recommendations for standing level LB ADLs, and residual limb care. Provided with amputee coalition booklet resource and encouraged to use online resources as well for preprosthetic training, residual limb care, and adjusting to life after amputation. Pt and son asked questions and verbally understood education throughout with son demonstrating increased awareness of importance of safety at home somewhat more than pt. Pt engaged in 2x functional transfers to TTB in WIS and tub/shower at ambulatory level with RW and mod I. Pt and son expressed desire to purchase TTB as transfer was easier and pt currently only has a small stool/seat in tub/shower. OT provided recommendation of use of BSC over low height toilet at home as pt does  not have grab bars currently at toilet; pt did not think necessary, but son agreed to set up when pt first got home for safety. Functional ambulation with RW ~200' total mod I. Pt engaged stretches and exercises on therapy mat to improve functional mobility and conditioning for BLEs in preparation for improved independence in ADLs and safe d/c home: prone exercises (3 reps of hip EXT 30x bilaterally), hip abduction in sidelying 1 set of 15x, and back stretches (piriformis- bilaterally). Pt required min verbal/tactile cues to complete as pt demoed difficulties with hip EXT with LLE. Pt reported pain in buttocks following exercises. Engaged in 10x modified pushups with bolster under pelvis. Pt returned to room, remained supine, alarm set, call bell in reach, and all immediate needs met.   Mellissa Kohut 03/28/2021, 3:24 PM

## 2021-03-28 NOTE — Progress Notes (Signed)
Inpatient Rehabilitation Care Coordinator Discharge Note  The overall goal for the admission was met for:   Discharge location: Yes-HOME ALONE WITH INTERMITTENT ASSIST FROM SON  Length of Stay: Yes-7 DAYS  Discharge activity level: Yes-MOD/I LEVEL  Home/community participation: Yes  Services provided included: MD, RD, PT, OT, RN, CM, Pharmacy, and SW  Financial Services: Private Insurance: [employer]  Choices offered to/list presented to:PT  Follow-up services arranged: Home Health: [facility]-PT and Patient/Family has no preference for HH/DME agenciesHAS ALL EQUIPMENT NEEDS  Comments (or additional information):PT DID WELL AND EXCEEDED GOALS. PLAN TO MOVE IN WITH SON AND HIS FAMILY ONCE RENOVATIONS DONE IN BASEMENT OF SON'S HOME. SON WAS ASSISTING DOWN STAIRS PRIOR TO ADMISSION AND TRANSPORTING PT  Patient/Family verbalized understanding of follow-up arrangements: Yes  Individual responsible for coordination of the follow-up plan: ANDREY-SON [phone]  Confirmed correct DME delivered: Elease Hashimoto 03/28/2021    Elease Hashimoto

## 2021-03-28 NOTE — Progress Notes (Signed)
Physical Therapy Discharge Summary  Patient Details  Name: Stanley Chaney MRN: [id number] Date of Birth: [birth year]   Patient has met 12 of 12 long term goals due to improved activity tolerance, improved balance, improved postural control, increased strength, increased range of motion, decreased pain, and improved coordination.  Patient to discharge at an ambulatory level Modified Independent.   Patient's care partner is independent to provide the necessary physical assistance at discharge.  Provided education to pt's son regarding stair navigation and safety awareness with R residual limb. Son in agreement and verbalized understanding.   Recommendation:  Patient will benefit from ongoing skilled PT services in home health setting to continue to advance safe functional mobility, address ongoing impairments in pain management, residual limb care, gait training, and minimize fall risk.  Equipment: No equipment provided  Reasons for discharge: treatment goals met  Patient/family agrees with progress made and goals achieved: Yes  PT Discharge Precautions/Restrictions Restrictions Weight Bearing Restrictions: Yes RLE Weight Bearing: Non weight bearing Cognition Overall Cognitive Status: Within Functional Limits for tasks assessed Arousal/Alertness: Awake/alert Orientation Level: Oriented X4 Safety/Judgment: Appears intact Sensation Sensation Light Touch: Appears Intact Proprioception: Appears Intact Additional Comments: Of LLE Coordination Gross Motor Movements are Fluid and Coordinated: No Coordination and Movement Description: Affected by Rt lower limb loss and altered center of gravity during dynamic standing activity Finger Nose Finger Test: [facility] bilaterally Heel Shin Test: NT 2/2 R AKA Motor  Motor Motor: Other (comment) Motor - Skilled Clinical Observations: Generalized weakness, Rt AKA  Mobility Bed Mobility Bed Mobility: Rolling Right;Rolling Left;Sit to  Supine;Sitting - Scoot to [employer] of Bed;Supine to Sit Rolling Right: Independent Rolling Left: Independent Supine to Sit: Independent Sitting - Scoot to [employer] of Bed: Independent Sit to Supine: Independent Transfers Transfers: Sit to Stand;Stand to [employer] Transfers Sit to Stand: Independent with assistive device Stand to Sit: Independent with assistive device Stand Pivot Transfers: Independent with assistive device Squat Pivot Transfers: Independent with assistive device Transfer (Assistive device): Rolling walker Locomotion  Gait Ambulation: Yes Gait Assistance: Independent with assistive device Gait Distance (Feet): 150 Feet Assistive device: Rolling walker Gait Gait: Yes Gait Pattern: Impaired Gait Pattern: Poor foot clearance - left;Trunk flexed Gait velocity: decr Stairs / Additional Locomotion Stairs: Yes Stairs Assistance: Contact Guard/Touching assist Stair Management Technique: One rail Left;Other (comment) (axillary crutch) Number of Stairs: 24 Height of Stairs: 6 Ramp: Supervision/Verbal cueing Curb: Contact Guard/Touching assist (RW) Wheelchair Mobility Wheelchair Mobility: Yes Wheelchair Assistance: Set up Lexicographer: Both upper extremities Wheelchair Parts Management: Supervision/cueing Distance: 150  Trunk/Postural Assessment  Cervical Assessment Cervical Assessment: Exceptions to [facility] (Forward head) Thoracic Assessment Thoracic Assessment: Exceptions to [facility] (Kyphotic) Lumbar Assessment Lumbar Assessment: Exceptions to [facility] (Posterior pelvic tilt) Postural Control Postural Control: Deficits on evaluation Protective Responses: Heavy reliance on UE for standing w/RW  Balance Balance Balance Assessed: Yes Static Sitting Balance Static Sitting - Balance Support: Feet supported;No upper extremity supported Static Sitting - Level of Assistance: 7: Independent Dynamic Sitting Balance Dynamic Sitting - Balance  Support: Feet supported Dynamic Sitting - Level of Assistance: 7: Independent Dynamic Sitting - Balance Activities: Forward lean/weight shifting;Lateral lean/weight shifting Static Standing Balance Static Standing - Balance Support: Bilateral upper extremity supported Static Standing - Level of Assistance: 6: Modified independent (Device/Increase time) (RW) Dynamic Standing Balance Dynamic Standing - Balance Support: Bilateral upper extremity supported Dynamic Standing - Level of Assistance: 6: Modified independent (Device/Increase time) (RW) Extremity Assessment  RLE Assessment RLE Assessment: Exceptions to [facility]  RLE Strength Right Hip Flexion: 3+/5 LLE Assessment LLE Assessment: Exceptions to [facility] LLE Strength Left Hip Flexion: 4+/5 Left Hip Extension: 4/5 Left Hip ABduction: 4+/5 Left Hip ADduction: 4+/5 Left Knee Flexion: 4+/5 Left Knee Extension: 4+/5 Left Ankle Dorsiflexion: 4+/5 Left Ankle Plantar Flexion: 4+/5    Pancho Rushing E Kaesyn Johnston 03/28/2021, 12:32 PM

## 2021-03-29 DIAGNOSIS — I10 Essential (primary) hypertension: Secondary | ICD-10-CM

## 2021-03-29 DIAGNOSIS — E875 Hyperkalemia: Secondary | ICD-10-CM

## 2021-03-29 LAB — BASIC METABOLIC PANEL
Anion gap: 13 (ref 5–15)
Anion gap: 12 (ref 5–15)
BUN: 40 mg/dL — ABNORMAL HIGH (ref 8–23)
BUN: 38 mg/dL — ABNORMAL HIGH (ref 8–23)
CO2: 27 mmol/L (ref 22–32)
CO2: 27 mmol/L (ref 22–32)
Calcium: 10.2 mg/dL (ref 8.9–10.3)
Calcium: 10 mg/dL (ref 8.9–10.3)
Chloride: 101 mmol/L (ref 98–111)
Chloride: 101 mmol/L (ref 98–111)
Creatinine, Ser: 1.38 mg/dL — ABNORMAL HIGH (ref 0.61–1.24)
Creatinine, Ser: 1.09 mg/dL (ref 0.61–1.24)
GFR, Estimated: 58 mL/min — ABNORMAL LOW (ref >60)
GFR, Estimated: >60 mL/min (ref >60)
Glucose, Bld: 149 mg/dL — ABNORMAL HIGH (ref 70–99)
Glucose, Bld: 119 mg/dL — ABNORMAL HIGH (ref 70–99)
Potassium: 5.5 mmol/L — ABNORMAL HIGH (ref 3.5–5.1)
Potassium: 5.2 mmol/L — ABNORMAL HIGH (ref 3.5–5.1)
Sodium: 141 mmol/L (ref 135–145)
Sodium: 140 mmol/L (ref 135–145)

## 2021-03-29 LAB — GLUCOSE, CAPILLARY
Glucose-Capillary: 138 mg/dL — ABNORMAL HIGH (ref 70–99)
Glucose-Capillary: 114 mg/dL — ABNORMAL HIGH (ref 70–99)

## 2021-03-29 MED ORDER — SODIUM CHLORIDE 0.45 % IV BOLUS
250.0000 mL | Freq: Once | INTRAVENOUS | Status: AC
  Administered 2021-03-29: 250 mL via INTRAVENOUS

## 2021-03-29 MED ORDER — OXYCODONE HCL 5 MG PO TABS: 5.0000 mg | ORAL_TABLET | Freq: Two times a day (BID) | ORAL | 0 refills | Status: AC | PRN

## 2021-03-29 MED ORDER — METFORMIN HCL 500 MG PO TABS: 500.0000 mg | ORAL_TABLET | Freq: Every day | ORAL | 0 refills | Status: DC

## 2021-03-29 MED ORDER — SODIUM CHLORIDE 0.45 % IV BOLUS
500.0000 mL | Freq: Once | INTRAVENOUS | Status: AC
  Administered 2021-03-29: 500 mL via INTRAVENOUS

## 2021-03-29 MED ORDER — ASPIRIN 81 MG PO TBEC: 81.0000 mg | DELAYED_RELEASE_TABLET | Freq: Every day | ORAL | 1 refills | Status: AC

## 2021-03-29 MED ORDER — ACETAMINOPHEN 325 MG PO TABS: 325.0000 mg | ORAL_TABLET | ORAL | 0 refills | Status: AC | PRN

## 2021-03-29 MED ORDER — METHOCARBAMOL 500 MG PO TABS: 500.0000 mg | ORAL_TABLET | Freq: Four times a day (QID) | ORAL | 0 refills | Status: AC | PRN

## 2021-03-29 MED ORDER — DAPAGLIFLOZIN PROPANEDIOL 5 MG PO TABS: 5.0000 mg | ORAL_TABLET | Freq: Every day | ORAL | 0 refills | Status: DC

## 2021-03-29 MED ORDER — CLOPIDOGREL BISULFATE 75 MG PO TABS: 75.0000 mg | ORAL_TABLET | Freq: Every day | ORAL | 0 refills | Status: DC

## 2021-03-29 MED ORDER — GABAPENTIN 300 MG PO CAPS: 300.0000 mg | ORAL_CAPSULE | Freq: Every day | ORAL | 0 refills | Status: AC

## 2021-03-29 MED ORDER — ASCORBIC ACID 1000 MG PO TABS: 1000.0000 mg | ORAL_TABLET | Freq: Every day | ORAL | 0 refills | Status: AC

## 2021-03-29 MED ORDER — ROSUVASTATIN CALCIUM 10 MG PO TABS: 10.0000 mg | ORAL_TABLET | Freq: Every day | ORAL | 0 refills | Status: DC

## 2021-03-29 MED ORDER — PANTOPRAZOLE SODIUM 40 MG PO TBEC: 40.0000 mg | DELAYED_RELEASE_TABLET | Freq: Every day | ORAL | 0 refills | Status: AC

## 2021-03-29 MED ORDER — ZINC SULFATE 220 (50 ZN) MG PO CAPS: 220.0000 mg | ORAL_CAPSULE | Freq: Every day | ORAL | 0 refills | Status: AC

## 2021-03-29 MED ORDER — SENNOSIDES-DOCUSATE SODIUM 8.6-50 MG PO TABS: 1.0000 | ORAL_TABLET | Freq: Every day | ORAL | 0 refills | Status: AC

## 2021-03-29 MED ORDER — DOCUSATE SODIUM 100 MG PO CAPS: 100.0000 mg | ORAL_CAPSULE | Freq: Every day | ORAL | 0 refills | Status: AC

## 2021-03-29 NOTE — Progress Notes (Signed)
PROGRESS NOTE   Subjective/Complaints:  No new issues. Still some ooze from incision but looks fine.   ROS: Patient denies fever, rash, sore throat, blurred vision, nausea, vomiting, diarrhea, cough, shortness of breath or chest pain, joint or back pain, headache, or mood change.    Objective:   No results found. No results for input(s): WBC, HGB, HCT, PLT in the last 72 hours.  Recent Labs    03/29/21 0645  NA 141  K 5.5*  CL 101  CO2 27  GLUCOSE 149*  BUN 40*  CREATININE 1.38*  CALCIUM 10.2    Intake/Output Summary (Last 24 hours) at 03/29/2021 0904 Last data filed at 03/29/2021 0720 Gross per 24 hour  Intake 240 ml  Output 1100 ml  Net -860 ml        Physical Exam: Vital Signs Blood pressure (!) 143/62, pulse (!) 57, temperature 97.7 F (36.5 C), resp. rate 18, height 5\' 10"  (1.778 m), weight 65.2 kg, SpO2 98 %.  Constitutional: No distress . Vital signs reviewed. HEENT: EOMI, oral membranes moist Neck: supple Cardiovascular: RRR without murmur. No JVD    Respiratory/Chest: CTA Bilaterally without wheezes or rales. Normal effort    GI/Abdomen: BS +, non-tender, non-distended Ext: no clubbing, cyanosis, or edema Psych: pleasant and cooperative  Skin: wound with sl s/s discharge. Incision with staples well approximated  Neurologic: Cranial nerves II through XII intact, motor strength is 5/5 in bilateral deltoid, bicep, tricep, grip, Left hip flexor, knee extensors, ankle dorsiflexor and plantar flexor RLE has 4-/5 HF. Sensory exam normal.  Musculoskeletal: RIght AKA less tender    Assessment/Plan: 1. Functional deficits which require 3+ hours per day of interdisciplinary therapy in a comprehensive inpatient rehab setting. Physiatrist is providing close team supervision and 24 hour management of active medical problems listed below. Physiatrist and rehab team continue to assess barriers to  discharge/monitor patient progress toward functional and medical goals  Care Tool:  Bathing    Body parts bathed by patient: Right arm, Left arm, Chest, Abdomen, Face, Front perineal area, Buttocks, Right upper leg, Left upper leg, Left lower leg     Body parts n/a: Right lower leg   Bathing assist Assist Level: Supervision/Verbal cueing     Upper Body Dressing/Undressing Upper body dressing   What is the patient wearing?: Pull over shirt    Upper body assist Assist Level: Independent with assistive device Assistive Device Comment: w/c or RW  Lower Body Dressing/Undressing Lower body dressing      What is the patient wearing?: Underwear/pull up, Pants     Lower body assist Assist for lower body dressing: Independent with assitive device Assistive Device Comment: bed level or seated   Toileting Toileting Toileting Activity did not occur (Clothing management and hygiene only): N/A (no void or bm)  Toileting assist Assist for toileting: Supervision/Verbal cueing Assistive Device Comment: RW in stance from std height toilet   Transfers Chair/bed transfer  Transfers assist     Chair/bed transfer assist level: Independent with assistive device Chair/bed transfer assistive device: Programmer, multimedia   Ambulation assist   Ambulation activity did not occur: Safety/medical concerns (Fatigue, impulsiveness)  Assist  level: Independent with assistive device Assistive device: Walker-rolling Max distance: 150'   Walk 10 feet activity   Assist  Walk 10 feet activity did not occur: Safety/medical concerns (Fatigue, impulsiveness)  Assist level: Independent with assistive device Assistive device: Walker-rolling   Walk 50 feet activity   Assist Walk 50 feet with 2 turns activity did not occur: Safety/medical concerns (Fatigue, impulsiveness)  Assist level: Independent with assistive device Assistive device: Walker-rolling    Walk 150 feet  activity   Assist Walk 150 feet activity did not occur: Safety/medical concerns (Fatigue, impulsiveness)  Assist level: Independent with assistive device Assistive device: Walker-rolling    Walk 10 feet on uneven surface  activity   Assist Walk 10 feet on uneven surfaces activity did not occur: Safety/medical concerns (Fatigue, impulsiveness)   Assist level: Supervision/Verbal cueing Assistive device: Aeronautical engineer Will patient use wheelchair at discharge?: Yes Type of Wheelchair: Manual    Wheelchair assist level: Set up assist Max wheelchair distance: 150'    Wheelchair 50 feet with 2 turns activity    Assist        Assist Level: Supervision/Verbal cueing   Wheelchair 150 feet activity     Assist  Wheelchair 150 feet activity did not occur:  (Fatigue, impulsiveness)   Assist Level: Supervision/Verbal cueing   Blood pressure (!) 143/62, pulse (!) 57, temperature 97.7 F (36.5 C), resp. rate 18, height 5\' 10"  (1.778 m), weight 65.2 kg, SpO2 98 %.  Medical Problem List and Plan: 1.  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 I spoke to son this morning abut #7. May need to hold another day 2.  Antithrombotics: -DVT/anticoagulation:  Pharmaceutical: Lovenox--added             -antiplatelet therapy: ASA/Plavix 3. Pain Management: Oxycodone prn effective.  4. Mood: LCSW to follow for evaluation and support.              -antipsychotic agents: N/A 5. Neuropsych: This patient is capable of making decisions on his own behalf. 6. Skin/Wound Care: continue dry dressing with shrinker             --continue Juven, Zinc and Vitamin C to promote wound healing. 7. Fluids/Electrolytes/Nutrition, AKI: have encouraged po but BUN/Cr/K+ all increased today  -500cc bolus 1/2ns, repeat BMET at 1200 today  -dc lisinopril    8.   Liposarcoma: To follow up at [facility] for evaluation- pt and son are aware of dx.  9. T2DM: Hgb A1c-7.9. Monitor BS ac/hs. Use SSI for elevated BS             --continue Metformin and Farziga daily CBG (last 3)  Recent Labs    03/28/21 1653 03/28/21 2058 03/29/21 0628  GLUCAP 101* 111* 138*   Controlled 7/21 10. HTN: Monitor BP tid--  Lisinopril daily. Vitals:   03/28/21 2101 03/29/21 0634  BP: 132/71 (!) 143/62  Pulse: 70 (!) 57  Resp: 16 18  Temp: 97.7 F (36.5 C) 97.7 F (36.5 C)  SpO2: 98% 98%  Holding ACE now. Consider sending home on clonidine given bradycardia 11. ABLA: essentially resolved Hgb 13.6, likely concentration effect 12. Constipation:  moving bowels  LOS: 7 days A FACE TO FACE EVALUATION WAS PERFORMED  Alroy Dust  Alen Blew 03/29/2021, 9:04 AM

## 2021-03-29 NOTE — Progress Notes (Signed)
PA Pam in with family to discuss discharge instructions. Pt/family in agreement. Belongings gathered. Pt left per wheelchair to private vehicle. No complications noted. Sheela Stack, LPN

## 2021-04-02 ENCOUNTER — Telehealth: Payer: Self-pay

## 2021-04-02 NOTE — Telephone Encounter (Signed)
[facility] wants to move PT to this Tuesday or Wed this week for Pt  on [place], son cancelled due to [employer] this past week?   [name] [phone] at [facility]

## 2021-04-02 NOTE — Telephone Encounter (Signed)
Yes - ok to move appt.

## 2021-04-03 NOTE — Telephone Encounter (Signed)
Called Shane with [facility] to inform. He voiced understanding.

## 2021-04-04 ENCOUNTER — Telehealth: Payer: Self-pay | Admitting: Family Medicine

## 2021-04-04 NOTE — Telephone Encounter (Signed)
I have not personally seen this patient since March 2021.  He did see my colleague in March of this year.  I do not see a hospital follow-up scheduled.  Please schedule that visit.  If glucose less than 70 or over 300 should be called at this point but again we can discuss parameters further at follow-up.

## 2021-04-04 NOTE — Telephone Encounter (Signed)
Spoke with [name] from [facility] and informed of providers recommendations. He states he spoke with the patient's son and he was supposed to have already scheduled a visit. States he will give patient's son a call today as we can not give order as this point.

## 2021-04-04 NOTE — Telephone Encounter (Signed)
..  Home Health Verbal Orders  Agency:  [facility]  Caller: Contact and title Shane  Requesting OT/ PT/ Skilled nursing/ Social Work/ Speech:  Physical therapy 2 x 4 weeks - Therapist would like to know parameters for this patients diabetes - When the doctor wants to be called  Reason for Request:  Hospitalized   Frequency:  2 x 4 weeks - physical therapy   HH needs F2F w/in last 30 days

## 2021-04-05 ENCOUNTER — Telehealth: Payer: Self-pay | Admitting: *Deleted

## 2021-04-05 NOTE — Telephone Encounter (Addendum)
Shane PT [facility] called to find out who will be signing HHPT orders 2wk4. Dr Naaman Plummer is the physician and approval given.

## 2021-04-11 DIAGNOSIS — I10 Essential (primary) hypertension: Secondary | ICD-10-CM

## 2021-04-11 DIAGNOSIS — K5900 Constipation, unspecified: Secondary | ICD-10-CM

## 2021-04-11 DIAGNOSIS — Z7902 Long term (current) use of antithrombotics/antiplatelets: Secondary | ICD-10-CM

## 2021-04-11 DIAGNOSIS — Z89611 Acquired absence of right leg above knee: Secondary | ICD-10-CM

## 2021-04-11 DIAGNOSIS — Z7984 Long term (current) use of oral hypoglycemic drugs: Secondary | ICD-10-CM

## 2021-04-11 DIAGNOSIS — M869 Osteomyelitis, unspecified: Secondary | ICD-10-CM | POA: Diagnosis not present

## 2021-04-11 DIAGNOSIS — E559 Vitamin D deficiency, unspecified: Secondary | ICD-10-CM

## 2021-04-11 DIAGNOSIS — M1711 Unilateral primary osteoarthritis, right knee: Secondary | ICD-10-CM

## 2021-04-11 DIAGNOSIS — Z7982 Long term (current) use of aspirin: Secondary | ICD-10-CM

## 2021-04-11 DIAGNOSIS — Z87891 Personal history of nicotine dependence: Secondary | ICD-10-CM

## 2021-04-11 DIAGNOSIS — C4921 Malignant neoplasm of connective and soft tissue of right lower limb, including hip: Secondary | ICD-10-CM | POA: Diagnosis not present

## 2021-04-11 DIAGNOSIS — E1151 Type 2 diabetes mellitus with diabetic peripheral angiopathy without gangrene: Secondary | ICD-10-CM

## 2021-04-11 DIAGNOSIS — E1169 Type 2 diabetes mellitus with other specified complication: Secondary | ICD-10-CM | POA: Diagnosis not present

## 2021-04-11 DIAGNOSIS — Z4781 Encounter for orthopedic aftercare following surgical amputation: Secondary | ICD-10-CM | POA: Diagnosis not present

## 2021-04-24 ENCOUNTER — Ambulatory Visit (INDEPENDENT_AMBULATORY_CARE_PROVIDER_SITE_OTHER): Payer: 59 | Admitting: Family

## 2021-04-24 ENCOUNTER — Other Ambulatory Visit: Payer: Self-pay

## 2021-04-24 ENCOUNTER — Encounter: Payer: Self-pay | Admitting: Family

## 2021-04-24 DIAGNOSIS — Z89611 Acquired absence of right leg above knee: Secondary | ICD-10-CM

## 2021-04-24 DIAGNOSIS — M869 Osteomyelitis, unspecified: Secondary | ICD-10-CM

## 2021-04-24 DIAGNOSIS — C499 Malignant neoplasm of connective and soft tissue, unspecified: Secondary | ICD-10-CM

## 2021-04-24 DIAGNOSIS — S78119A Complete traumatic amputation at level between unspecified hip and knee, initial encounter: Secondary | ICD-10-CM

## 2021-04-24 NOTE — Progress Notes (Signed)
Post-Op Visit Note   Patient: Stanley Chaney           Date of Birth: May 23, 1961           MRN: [id number] Visit Date: 04/24/2021 PCP: Wendie Agreste, MD  Chief Complaint:  Chief Complaint  Patient presents with   Right Knee - Routine Post Op    HPI:  HPI The patient is a 60 year old gentleman seen in postoperative follow-up.  Status post right above-knee amputation July 8.  He has been seen by oncology with orthopedics at [facility] health unfortunately this is out of network for him he is requesting referral to a different provider today  Has been working on physical therapy doing twice weekly home health physical therapy. Ortho Exam Incision is well-healed staples harvested today without incident.  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 Knee osteomyelits, right ([place])   3. Above-knee amputation ([place])     Plan: Have encouraged him to continue with his current wound care wear shrinker around-the-clock.  We will go ahead and refer him to [facility] orthopedic oncology  Follow-Up Instructions: No follow-ups on file.   Imaging: No results found.  Orders:  Orders Placed This Encounter  Procedures   Ambulatory referral to Surgical Oncology   No orders of the defined types were placed in this encounter.    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[place]) 03/22/2021   Above knee amputation of right lower extremity ([place]) 03/16/2021   Mass of soft tissue of thigh    Dental caries 08/08/2020   HTN (hypertension) 07/21/2020   Rheumatoid factor positive 07/21/2020   Vitamin D deficiency 07/21/2020   Knee osteomyelits, right ([place])    Arthritis of right knee 07/19/2020   Septic arthritis of knee, right ([place]) 07/18/2020   PAD (peripheral  artery disease) ([place]) 07/20/2018   Malnutrition of moderate degree 06/11/2018   Cellulitis of left foot 06/10/2018   Diabetes mellitus ([place]) 06/10/2018   Hyperglycemia 06/10/2018   Cellulitis 06/10/2018   Past Medical History:  Diagnosis Date   Arthritis of right knee 07/19/2020   Diabetes mellitus without complication (HCC)    PAD (peripheral artery disease) ([place])    Rheumatoid factor positive 07/21/2020   Vitamin D deficiency 07/21/2020    Family History  Problem Relation Age of Onset   Diabetes Mother    Cancer Mother     Past Surgical History:  Procedure Laterality Date   ABDOMINAL AORTOGRAM W/LOWER EXTREMITY Bilateral 06/15/2018   Procedure: ABDOMINAL AORTOGRAM W/LOWER EXTREMITY;  Surgeon: [name], MD;  Location: [place] [facility];  Service: Cardiovascular;  Laterality: Bilateral;   ABDOMINAL AORTOGRAM W/LOWER EXTREMITY Left 12/30/2018   Procedure: ABDOMINAL AORTOGRAM W/LOWER EXTREMITY;  Surgeon: [name], MD;  Location: [place] [facility];  Service: Cardiovascular;  Laterality: Left;   ABDOMINAL AORTOGRAM W/LOWER EXTREMITY N/A 07/10/2020   Procedure: ABDOMINAL AORTOGRAM W/LOWER EXTREMITY;  Surgeon: [name], MD;  Location: [place] [facility];  Service: Cardiovascular;  Laterality: N/A;  Bilateral   AMPUTATION Right 03/16/2021   Procedure: RIGHT ABOVE KNEE AMPUTATION;  Surgeon: [name], MD;  Location: [place];  Service: Orthopedics;  Laterality: Right;   I & D EXTREMITY Left 06/12/2018   Procedure: Excisional Debridement left foot;  Surgeon:  [name], MD;  Location: [facility];  Service: Orthopedics;  Laterality: Left;   IRRIGATION AND DEBRIDEMENT KNEE Right 07/18/2020   Procedure: IRRIGATION AND DEBRIDEMENT RIGHT KNEE;  Surgeon: Altamese [place], MD;  Location: [place];  Service: Orthopedics;  Laterality: Right;   KNEE ARTHROSCOPY Right 07/18/2020   Procedure: ARTHROSCOPY KNEE SYNOVECTOMY;  Surgeon: Altamese [place], MD;  Location:  [place];  Service: Orthopedics;  Laterality: Right;   LOWER EXTREMITY ANGIOGRAM Right 07/20/2018     Right lower extremity angiogram   LOWER EXTREMITY ANGIOGRAPHY Right 07/20/2018   Procedure: Lower Extremity Angiography;  Surgeon: [name], MD;  Location: [place] [facility];  Service: Cardiovascular;  Laterality: Right;   PERIPHERAL VASCULAR ATHERECTOMY Left 06/15/2018   Procedure: PERIPHERAL VASCULAR ATHERECTOMY;  Surgeon: [name], MD;  Location: [place] [facility];  Service: Cardiovascular;  Laterality: Left;  SFA   PERIPHERAL VASCULAR BALLOON ANGIOPLASTY Left 06/15/2018   Procedure: PERIPHERAL VASCULAR BALLOON ANGIOPLASTY;  Surgeon: [name], MD;  Location: [place] [facility];  Service: Cardiovascular;  Laterality: Left;  SFA   PERIPHERAL VASCULAR BALLOON ANGIOPLASTY Right 07/20/2018   Procedure: PERIPHERAL VASCULAR BALLOON ANGIOPLASTY;  Surgeon: [name], MD;  Location: [place] [facility];  Service: Cardiovascular;  Laterality: Right;  SFA WITH DRUG COATED    PERIPHERAL VASCULAR INTERVENTION Left 12/30/2018   Procedure: PERIPHERAL VASCULAR INTERVENTION;  Surgeon: [name], MD;  Location: [place] [facility];  Service: Cardiovascular;  Laterality: Left;  SFA   Social History   Occupational History   Not on file  Tobacco Use   Smoking status: Former    Types: Cigarettes    Quit date: 06/14/2018    Years since quitting: 2.8   Smokeless tobacco: Never   Tobacco comments:    pt reports quitting 2 months ago  Vaping Use   Vaping Use: Never used  Substance and Sexual Activity   Alcohol use: Not Currently    Alcohol/week: 18.0 standard drinks    Types: 18 Cans of beer per week    Comment: pt son reports not drinking for past couple years   Drug use: Never   Sexual activity: Not on file

## 2021-04-30 ENCOUNTER — Ambulatory Visit: Payer: 59 | Admitting: Family Medicine

## 2021-04-30 ENCOUNTER — Telehealth: Payer: Self-pay | Admitting: Family Medicine

## 2021-04-30 ENCOUNTER — Other Ambulatory Visit: Payer: Self-pay

## 2021-04-30 DIAGNOSIS — I739 Peripheral vascular disease, unspecified: Secondary | ICD-10-CM

## 2021-04-30 MED ORDER — CLOPIDOGREL BISULFATE 75 MG PO TABS: 75.0000 mg | ORAL_TABLET | Freq: Every day | ORAL | 0 refills | Status: DC

## 2021-04-30 NOTE — Telephone Encounter (Signed)
Patient is out of his blood thinner  - Plavix - Patient was late for his appointment and [facility] said it would have to be rescheduled.  Please send to [facility] in [place].

## 2021-04-30 NOTE — Telephone Encounter (Signed)
Medication sent to pharmacy  

## 2021-04-30 NOTE — Telephone Encounter (Signed)
..  Home Health Certification or Plan of Care Tracking  Is this a Certification or Plan of Care?  yes  [place]:  [place]  Order Number:  [id number]  Has charge sheet been attached? yes  Where has form been placed:   In Dr. Vonna Kotyk box

## 2021-05-09 ENCOUNTER — Ambulatory Visit (INDEPENDENT_AMBULATORY_CARE_PROVIDER_SITE_OTHER): Payer: 59 | Admitting: Family Medicine

## 2021-05-09 ENCOUNTER — Other Ambulatory Visit: Payer: Self-pay

## 2021-05-09 VITALS — BP 138/74 | HR 71 | Temp 98.1°F | Resp 16 | Ht 70.0 in | Wt 148.2 lb

## 2021-05-09 DIAGNOSIS — E875 Hyperkalemia: Secondary | ICD-10-CM

## 2021-05-09 DIAGNOSIS — S78119A Complete traumatic amputation at level between unspecified hip and knee, initial encounter: Secondary | ICD-10-CM | POA: Diagnosis not present

## 2021-05-09 DIAGNOSIS — C499 Malignant neoplasm of connective and soft tissue, unspecified: Secondary | ICD-10-CM

## 2021-05-09 DIAGNOSIS — Z23 Encounter for immunization: Secondary | ICD-10-CM | POA: Diagnosis not present

## 2021-05-09 DIAGNOSIS — E1159 Type 2 diabetes mellitus with other circulatory complications: Secondary | ICD-10-CM | POA: Diagnosis not present

## 2021-05-09 DIAGNOSIS — E8809 Other disorders of plasma-protein metabolism, not elsewhere classified: Secondary | ICD-10-CM

## 2021-05-09 DIAGNOSIS — I739 Peripheral vascular disease, unspecified: Secondary | ICD-10-CM

## 2021-05-09 LAB — MICROALBUMIN / CREATININE URINE RATIO
Creatinine,U: 74 mg/dL
Microalb Creat Ratio: 4.9 mg/g (ref 0.0–30.0)
Microalb, Ur: 3.6 mg/dL — ABNORMAL HIGH (ref 0.0–1.9)

## 2021-05-09 MED ORDER — CLOPIDOGREL BISULFATE 75 MG PO TABS: 75.0000 mg | ORAL_TABLET | Freq: Every day | ORAL | 1 refills | Status: DC

## 2021-05-09 MED ORDER — ROSUVASTATIN CALCIUM 10 MG PO TABS: 10.0000 mg | ORAL_TABLET | Freq: Every day | ORAL | 1 refills | Status: DC

## 2021-05-09 MED ORDER — METFORMIN HCL 500 MG PO TABS: 500.0000 mg | ORAL_TABLET | Freq: Two times a day (BID) | ORAL | 1 refills | Status: DC

## 2021-05-09 MED ORDER — DAPAGLIFLOZIN PROPANEDIOL 5 MG PO TABS: 5.0000 mg | ORAL_TABLET | Freq: Every day | ORAL | 1 refills | Status: AC

## 2021-05-09 NOTE — Patient Instructions (Addendum)
I will check into referral for [facility].   For diabetes - increase metformin to twice per day. Same dose of Farxiga for now.   Keep a record of your blood pressures outside of the office and bring them to the next office visit. If running over 140/90 - may need to restart lisinopril.

## 2021-05-09 NOTE — Progress Notes (Signed)
Subjective:  Patient ID: Stanley Chaney, male    DOB: 1961-06-29  Age: 60 y.o. MRN: [id number]  CC:  Chief Complaint  Patient presents with   Cancer    Pt here to follow up on amputation from Rt leg, notes unable to see oncology yet, needs new referral placed to be seen, prefer [facility] due to insurance. Pt also has forms for home health    Immunizations    Pt agreeable to second pneumonia vaccine if advised today     HPI Stanley Chaney presents for   Hospital follow-up, follow-up for ongoing care.  I last saw him in March 2021.seen by my colleagues since that time. Here with son.   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  Amputation on 03/16/21. Hospitalized, then inpatient rehab. Home PT for a few weeks.  No PT at this time.  Using walker or crutches.  Lives by self  - no needs at home at this time. Son 59mn away and checks on him frequently. Ultimate plan to live with his son once basement finished.   Plan for possible prosthesis, but may be delayed depending on plan for workup of liposarcoma.  [facility]. Follow up with ortho 04/24/21 - [facility].   Lab Results  Component Value Date   WBC 5.5 03/26/2021   HGB 13.6 03/26/2021   HCT 42.7 03/26/2021   MCV 82.6 03/26/2021   PLT 379 03/26/2021   Diabetes: With PAD, hyperglycemia.  Home readings 130-150 fasting recently. Rare postprandials.  No recent gabapentin - not needed -not having nerve pain.  Metformin 5062mQD, no new side effects  Farxiga 59m8md.  Insulin in hospital only.  No symptomatic lows. Eating and drinking normally. Weight increasing since he has come home - eating better.  Lab Results  Component Value Date   LABPROT 10.0 11/30/2020  Albumin 2.9 on 7/15  On statin - Crestor.   On Plavix with PAD.  Microalbumin and prevnar 20 today.  Off lisinopril as BP ok, possible prior dehydration.   Lab Results  Component Value Date   HGBA1C 8.6 (H) 03/17/2021   HGBA1C 7.9 (A) 11/27/2020   HGBA1C 8.2 (A) 10/26/2020   Lab Results  Component Value Date   LDLCALC 63 11/30/2020   CREATININE 1.09 03/29/2021   Hyperkalemia: No potassium supplement.  5.5, 5.2 in July.    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   Above knee amputation of right lower extremity ([place]CC[place]7/04/2021   Mass of soft tissue of thigh    Dental caries 08/08/2020   HTN (hypertension) 07/21/2020   Rheumatoid factor positive 07/21/2020   Vitamin D deficiency 07/21/2020   Knee osteomyelits, right ([place]  Arthritis of right knee 07/19/2020   Septic arthritis of knee, right (HCCR[place]1/05/2020   PAD (peripheral artery disease) ([place]CC[place]1/07/2018   Malnutrition of moderate degree 06/11/2018   Cellulitis of left foot 06/10/2018   Diabetes mellitus (HCC[place]0/10/2017   Hyperglycemia 06/10/2018   Cellulitis 06/10/2018   Past Medical History:  Diagnosis Date   Arthritis of right knee 07/19/2020   Diabetes mellitus without complication (HCC)    PAD (peripheral artery disease) ([place]CC[place]  Rheumatoid factor positive 07/21/2020   Vitamin D deficiency 07/21/2020   Past Surgical History:  Procedure Laterality Date   ABDOMINAL AORTOGRAM W/LOWER EXTREMITY Bilateral 06/15/2018   Procedure: ABDOMINAL AORTOGRAM W/LOWER EXTREMITY;  Surgeon: [name], MD;  Location: [place] [facility];  Service: Cardiovascular;  Laterality: Bilateral;   ABDOMINAL AORTOGRAM W/LOWER EXTREMITY Left 12/30/2018   Procedure: ABDOMINAL AORTOGRAM W/LOWER EXTREMITY;  Surgeon: [name], MD;  Location: [place] [facility];  Service: Cardiovascular;  Laterality: Left;   ABDOMINAL AORTOGRAM W/LOWER EXTREMITY N/A 07/10/2020   Procedure: ABDOMINAL AORTOGRAM W/LOWER  EXTREMITY;  Surgeon: [name], MD;  Location: [place] [facility];  Service: Cardiovascular;  Laterality: N/A;  Bilateral   AMPUTATION Right 03/16/2021   Procedure: RIGHT ABOVE KNEE AMPUTATION;  Surgeon: [name], MD;  Location: [place];  Service: Orthopedics;  Laterality: Right;   I & D EXTREMITY Left 06/12/2018   Procedure: Excisional Debridement left foot;  Surgeon: [name], MD;  Location: [facility];  Service: Orthopedics;  Laterality: Left;   IRRIGATION AND DEBRIDEMENT KNEE Right 07/18/2020   Procedure: IRRIGATION AND DEBRIDEMENT RIGHT KNEE;  Surgeon: Altamese Eckhart Mines, MD;  Location: [facility];  Service: Orthopedics;  Laterality: Right;   KNEE ARTHROSCOPY Right 07/18/2020   Procedure: ARTHROSCOPY KNEE SYNOVECTOMY;  Surgeon: Altamese Falmouth, MD;  Location: [place];  Service: Orthopedics;  Laterality: Right;   LOWER EXTREMITY ANGIOGRAM Right 07/20/2018     Right lower extremity angiogram   LOWER EXTREMITY ANGIOGRAPHY Right 07/20/2018   Procedure: Lower Extremity Angiography;  Surgeon: [name], MD;  Location: [place] [facility];  Service: Cardiovascular;  Laterality: Right;   PERIPHERAL VASCULAR ATHERECTOMY Left 06/15/2018   Procedure: PERIPHERAL VASCULAR ATHERECTOMY;  Surgeon: [name], MD;  Location: [place] [facility];  Service: Cardiovascular;  Laterality: Left;  SFA   PERIPHERAL VASCULAR BALLOON ANGIOPLASTY Left 06/15/2018   Procedure: PERIPHERAL VASCULAR BALLOON ANGIOPLASTY;  Surgeon: [name], MD;  Location: [place] [facility];  Service: Cardiovascular;  Laterality: Left;  SFA   PERIPHERAL VASCULAR BALLOON ANGIOPLASTY Right 07/20/2018   Procedure: PERIPHERAL VASCULAR BALLOON ANGIOPLASTY;  Surgeon: [name], MD;  Location: [place] [facility];  Service: Cardiovascular;  Laterality: Right;  SFA WITH DRUG COATED    PERIPHERAL VASCULAR INTERVENTION Left 12/30/2018   Procedure: PERIPHERAL VASCULAR INTERVENTION;  Surgeon:  [name], MD;  Location: [place] [facility];  Service: Cardiovascular;  Laterality: Left;  SFA   Allergies  Allergen Reactions   Lisinopril Other (See Comments)    Hyperkalemia/AKI   Prior to Admission medications   Medication Sig Start Date End Date Taking? Authorizing Provider  acetaminophen (TYLENOL) 325 MG tablet Take 1-2 tablets (325-650 mg total) by mouth every 4 (four) hours as needed for mild pain. 03/29/21  Yes Love, Ivan Anchors, PA-C  ascorbic acid (VITAMIN C) 1000 MG tablet Take 1 tablet (1,000 mg total) by mouth daily. 03/29/21  Yes Love, Ivan Anchors, PA-C  aspirin 81 MG EC tablet Take 1 tablet (81 mg total) by mouth daily. 03/29/21  Yes Love, Ivan Anchors, PA-C  blood glucose meter kit and supplies Dispense based on patient and insurance preference. Use up to four times daily as directed. (FOR ICD-10 E10.9, E11.9). 06/16/18  Yes Domenic Polite, MD  clopidogrel (PLAVIX) 75 MG tablet Take 1 tablet (75 mg total) by mouth daily with breakfast. 04/30/21  Yes Wendie Agreste, MD  dapagliflozin propanediol (FARXIGA) 5 MG TABS tablet Take 1 tablet (5 mg total) by mouth daily before breakfast. 03/29/21  Yes Love, Ivan Anchors, PA-C  docusate sodium (COLACE) 100 MG capsule Take 1 capsule (100 mg total) by mouth daily. 03/29/21  Yes Love, Ivan Anchors, PA-C  gabapentin (NEURONTIN) 300 MG capsule Take 1 capsule (300 mg total) by mouth at bedtime. 03/29/21  Yes Love, Ivan Anchors, PA-C  metFORMIN (GLUCOPHAGE) 500 MG tablet Take 1 tablet (500 mg total) by mouth daily with breakfast. 03/29/21  Yes Love, Ivan Anchors, PA-C  methocarbamol (ROBAXIN) 500 MG tablet Take 1 tablet (500 mg total) by mouth every 6 (six) hours as needed for muscle spasms. 03/29/21  Yes Love, Ivan Anchors, PA-C  oxyCODONE (OXY IR/ROXICODONE) 5 MG immediate release tablet Take 1 tablet (5 mg total) by mouth 2 (two) times daily as needed for severe pain. 03/29/21  Yes Love, Ivan Anchors, PA-C  pantoprazole (PROTONIX) 40 MG tablet Take 1 tablet (40 mg  total) by mouth daily. 03/29/21  Yes Love, Ivan Anchors, PA-C  rosuvastatin (CRESTOR) 10 MG tablet Take 1 tablet (10 mg total) by mouth daily after supper. 03/29/21  Yes Love, Ivan Anchors, PA-C  senna-docusate (SENOKOT-S) 8.6-50 MG tablet Take 1 tablet by mouth daily after supper. Laxative--purchase over the counter 03/29/21  Yes Love, Ivan Anchors, PA-C  zinc sulfate 220 (50 Zn) MG capsule Take 1 capsule (220 mg total) by mouth daily. Purchase over the counter 03/29/21  Yes Love, Ivan Anchors, PA-C   Social History   Socioeconomic History   Marital status: Widowed    Spouse name: Not on file   Number of children: Not on file   Years of education: Not on file   Highest education level: Not on file  Occupational History   Not on file  Tobacco Use   Smoking status: Former    Types: Cigarettes    Quit date: 06/14/2018    Years since quitting: 2.9   Smokeless tobacco: Never   Tobacco comments:    pt reports quitting 2 months ago  Vaping Use   Vaping Use: Never used  Substance and Sexual Activity   Alcohol use: Not Currently    Alcohol/week: 18.0 standard drinks    Types: 18 Cans of beer per week    Comment: pt son reports not drinking for past couple years   Drug use: Never   Sexual activity: Not on file  Other Topics Concern   Not on file  Social History Narrative   Not on file   Social Determinants of Health   Financial Resource Strain: Not on file  Food Insecurity: Not on file  Transportation Needs: Not on file  Physical Activity: Not on file  Stress: Not on file  Social Connections: Not on file  Intimate Partner Violence: Not on file    Review of Systems Per HPI.    Objective:   Vitals:   05/09/21 1120  BP: 138/74  Pulse: 71  Resp: 16  Temp: 98.1 F (36.7 C)  TempSrc: Temporal  SpO2: 97%  Weight: 148 lb 3.2 oz (67.2 kg)  Height: $Remove'5\' 10"'wOLXpgn$  (1.778 m)     Physical Exam Vitals reviewed.  Constitutional:      Appearance: He is well-developed.  HENT:     Head:  Normocephalic and atraumatic.  Neck:     Vascular: No carotid bruit or JVD.  Cardiovascular:     Rate and Rhythm: Normal rate and regular rhythm.     Heart sounds: Normal heart sounds. No murmur heard. Pulmonary:     Effort: Pulmonary effort is normal.     Breath sounds: Normal breath sounds.  No rales.  Musculoskeletal:     Right lower leg: No edema.     Left lower leg: No edema.     Comments: Status post right above-knee amputation.  No pedal edema noted on left lower extremity.  Skin:    General: Skin is warm and dry.  Neurological:     Mental Status: He is alert and oriented to person, place, and time.  Psychiatric:        Mood and Affect: Mood normal.       Assessment & Plan:  Stanley Chaney is a 60 y.o. male . 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  I do see where he has been referred to [place] oncology and will have our staff check into that appointment to see if I need to place another referral.  PAD (peripheral artery disease) ([place]) - Plan: clopidogrel (PLAVIX) 75 MG tablet  -Continued on Plavix, continue same with follow-up with specialist as planned.  Above-knee amputation (HCC)  -Has assistance at home from son, no acute needs at this time.  Question timing of prosthesis given liposarcoma and unknown plans for treatment as above.  Type 2 diabetes mellitus with other circulatory complication, without long-term current use of insulin (HCC) - Plan: Microalbumin / creatinine urine ratio, rosuvastatin (CRESTOR) 10 MG tablet, dapagliflozin propanediol (FARXIGA) 5 MG TABS tablet, metFORMIN (GLUCOPHAGE) 500 MG tablet  -Decreased control, will attempt improved control given comorbidities as above.  Trial of increase metformin to 500 mg twice daily, continue [place] same dose for now, check urine microalbumin, repeat A1c in October.  Hyperkalemia Hypoalbuminemia  -Check labs.  Reports  sufficient appetite, intake at home.  Need for pneumococcal vaccination - Plan: Pneumococcal conjugate vaccine 20-valent (Prevnar 20)   Meds ordered this encounter  Medications   clopidogrel (PLAVIX) 75 MG tablet    Sig: Take 1 tablet (75 mg total) by mouth daily with breakfast.    Dispense:  90 tablet    Refill:  1   rosuvastatin (CRESTOR) 10 MG tablet    Sig: Take 1 tablet (10 mg total) by mouth daily after supper.    Dispense:  90 tablet    Refill:  1   dapagliflozin propanediol (FARXIGA) 5 MG TABS tablet    Sig: Take 1 tablet (5 mg total) by mouth daily before breakfast.    Dispense:  90 tablet    Refill:  1   metFORMIN (GLUCOPHAGE) 500 MG tablet    Sig: Take 1 tablet (500 mg total) by mouth 2 (two) times daily with a meal.    Dispense:  180 tablet    Refill:  1   Patient Instructions  I will check into referral for [facility].   For diabetes - increase metformin to twice per day. Same dose of Farxiga for now.   Keep a record of your blood pressures outside of the office and bring them to the next office visit. If running over 140/90 - may need to restart lisinopril.           Signed,   [name], MD [place] 05/09/21 12:52 PM

## 2021-05-11 ENCOUNTER — Encounter: Payer: Self-pay | Admitting: Family Medicine

## 2021-05-23 ENCOUNTER — Telehealth: Payer: Self-pay

## 2021-05-23 DIAGNOSIS — S78119A Complete traumatic amputation at level between unspecified hip and knee, initial encounter: Secondary | ICD-10-CM

## 2021-05-23 DIAGNOSIS — C499 Malignant neoplasm of connective and soft tissue, unspecified: Secondary | ICD-10-CM

## 2021-05-23 NOTE — Telephone Encounter (Signed)
Pt son is calling about a referral to be put in to the [facility] in [place]?   Pt call back [phone]

## 2021-05-25 NOTE — Telephone Encounter (Signed)
Called and spoke with patient son. Per your order referral has been placed for any possible facility including [facility] or [facility]. I also informed patient son per you that if he has not heard from anyone within 2 weeks to let us know. Son understood. No further concerns at this time.

## 2021-05-25 NOTE — Telephone Encounter (Signed)
Please call patient, son.  I had referrals check into status, and was not referred through [facility].  I placed a new referral for any possible facility, including other tertiary care facility such as [facility] or [facility] if needed.  If they have not heard from referrals or that facility in the next 2 weeks, let me know.

## 2021-07-04 ENCOUNTER — Encounter: Payer: Self-pay | Admitting: Family Medicine

## 2021-07-04 ENCOUNTER — Other Ambulatory Visit: Payer: Self-pay

## 2021-07-04 ENCOUNTER — Ambulatory Visit (INDEPENDENT_AMBULATORY_CARE_PROVIDER_SITE_OTHER): Payer: 59 | Admitting: Family Medicine

## 2021-07-04 VITALS — BP 138/78 | HR 68 | Temp 98.0°F | Resp 16 | Ht 70.0 in | Wt 148.0 lb

## 2021-07-04 DIAGNOSIS — E1159 Type 2 diabetes mellitus with other circulatory complications: Secondary | ICD-10-CM

## 2021-07-04 DIAGNOSIS — Z23 Encounter for immunization: Secondary | ICD-10-CM

## 2021-07-04 LAB — POCT GLYCOSYLATED HEMOGLOBIN (HGB A1C): Hemoglobin A1C: 7.5 % — AB (ref 4.0–5.6)

## 2021-07-04 LAB — GLUCOSE, POCT (MANUAL RESULT ENTRY): POC Glucose: 161 mg/dl — AB (ref 70–99)

## 2021-07-04 NOTE — Progress Notes (Signed)
Subjective:  Patient ID: Stanley Chaney, male    DOB: 06/07/1961  Age: 60 y.o. MRN: [id number]  CC:  Chief Complaint  Patient presents with   Diabetes    Pt here for 2 month follow up, doing well, reports he likes to eat once daily and will typically only take metformin once daily unless he remembers or has a second meal    Immunizations    Discuss having shingles vaccine, pt requested flu vaccine     HPI [name] presents for   Diabetes: Complicated by PAD, hyperglycemia, s/p RLE amputation. Last visit August 31.  Metformin 500 mg daily at that time.  Farxiga 5 mg daily.  Insulin when he was hospitalized only. He is on statin.  Plavix with PAD. Home readings - 130-160  Varied schedule. Takes metformin once per day, not taking 2nd dose at times as concerned. Eating once per day usually. Declines meeting with nutritionist at this time.   Microalbumin: 3.6 in 8/31 - normal ratio.  Optho, foot exam, pneumovax:  Due for optho exam. Problems with dilation in past, pain next day.   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  Liposarcoma in the lower extremity on the right.  Plan for right lateral thigh excision of sarcoma tumor bed possible soft tissue Joenathan Sakuma arrangement and possible VAC dressing on November 2nd.   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   Above knee amputation of right lower extremity ([place]) 03/16/2021   Mass of soft  tissue of thigh    Dental caries 08/08/2020   HTN (hypertension) 07/21/2020   Rheumatoid factor positive 07/21/2020   Vitamin D deficiency 07/21/2020   Knee osteomyelits, right ([place])    Arthritis of right knee 07/19/2020   Septic arthritis of knee, right ([place]) 07/18/2020   PAD (peripheral artery disease) ([place]) 07/20/2018   Malnutrition of moderate degree 06/11/2018   Cellulitis of left foot 06/10/2018   Diabetes mellitus ([place]) 06/10/2018   Hyperglycemia 06/10/2018   Cellulitis 06/10/2018   Past Medical History:  Diagnosis Date   Arthritis of right knee 07/19/2020   Diabetes mellitus without complication (HCC)    PAD (peripheral artery disease) ([place])    Rheumatoid factor positive 07/21/2020   Vitamin D deficiency 07/21/2020   Past Surgical History:  Procedure Laterality Date   ABDOMINAL AORTOGRAM W/LOWER EXTREMITY Bilateral 06/15/2018   Procedure: ABDOMINAL AORTOGRAM W/LOWER EXTREMITY;  Surgeon: [name], MD;  Location: [facility];  Service: Cardiovascular;  Laterality: Bilateral;   ABDOMINAL AORTOGRAM W/LOWER EXTREMITY Left 12/30/2018   Procedure: ABDOMINAL AORTOGRAM W/LOWER EXTREMITY;  Surgeon: [name], MD;  Location: [facility];  Service: Cardiovascular;  Laterality: Left;   ABDOMINAL AORTOGRAM W/LOWER EXTREMITY N/A 07/10/2020   Procedure: ABDOMINAL AORTOGRAM W/LOWER EXTREMITY;  Surgeon: [name], MD;  Location: [facility];  Service: Cardiovascular;  Laterality: N/A;  Bilateral   AMPUTATION Right 03/16/2021   Procedure: RIGHT ABOVE KNEE AMPUTATION;  Surgeon: [name], MD;  Location: [facility];  Service: Orthopedics;  Laterality: Right;   I & D EXTREMITY Left 06/12/2018   Procedure: Excisional Debridement left foot;  Surgeon: [name], MD;  Location: [facility];  Service: Orthopedics;  Laterality: Left;   IRRIGATION AND DEBRIDEMENT KNEE Right 07/18/2020   Procedure: IRRIGATION AND DEBRIDEMENT RIGHT KNEE;   Surgeon: Altamese Sparks, MD;  Location: [place] [facility];  Service: Orthopedics;  Laterality: Right;   KNEE ARTHROSCOPY Right 07/18/2020   Procedure: ARTHROSCOPY KNEE SYNOVECTOMY;  Surgeon: Altamese [place], MD;  Location: [facility];  Service: Orthopedics;  Laterality: Right;   LOWER EXTREMITY ANGIOGRAM Right 07/20/2018     Right lower extremity angiogram   LOWER EXTREMITY ANGIOGRAPHY Right 07/20/2018   Procedure: Lower Extremity Angiography;  Surgeon: [name], MD;  Location: [facility];  Service: Cardiovascular;  Laterality: Right;   PERIPHERAL VASCULAR ATHERECTOMY Left 06/15/2018   Procedure: PERIPHERAL VASCULAR ATHERECTOMY;  Surgeon: [name], MD;  Location: [facility];  Service: Cardiovascular;  Laterality: Left;  SFA   PERIPHERAL VASCULAR BALLOON ANGIOPLASTY Left 06/15/2018   Procedure: PERIPHERAL VASCULAR BALLOON ANGIOPLASTY;  Surgeon: [name], MD;  Location: [facility];  Service: Cardiovascular;  Laterality: Left;  SFA   PERIPHERAL VASCULAR BALLOON ANGIOPLASTY Right 07/20/2018   Procedure: PERIPHERAL VASCULAR BALLOON ANGIOPLASTY;  Surgeon: [name], MD;  Location: [facility];  Service: Cardiovascular;  Laterality: Right;  SFA WITH DRUG COATED    PERIPHERAL VASCULAR INTERVENTION Left 12/30/2018   Procedure: PERIPHERAL VASCULAR INTERVENTION;  Surgeon: [name], MD;  Location: [facility];  Service: Cardiovascular;  Laterality: Left;  SFA   Allergies  Allergen Reactions   Lisinopril Other (See Comments)    Hyperkalemia/AKI   Prior to Admission medications   Medication Sig Start Date End Date Taking? Authorizing Provider  acetaminophen (TYLENOL) 325 MG tablet Take 1-2 tablets (325-650 mg total) by mouth every 4 (four) hours as needed for mild pain. 03/29/21  Yes Love, Ivan Anchors, PA-C  ascorbic acid (VITAMIN C) 1000 MG tablet Take 1 tablet (1,000 mg total) by mouth daily. 03/29/21  Yes  Love, Ivan Anchors, PA-C  aspirin 81 MG EC tablet Take 1 tablet (81 mg total) by mouth daily. 03/29/21  Yes Love, Ivan Anchors, PA-C  blood glucose meter kit and supplies Dispense based on patient and insurance preference. Use up to four times daily as directed. (FOR ICD-10 E10.9, E11.9). 06/16/18  Yes Domenic Polite, MD  clopidogrel (PLAVIX) 75 MG tablet Take 1 tablet (75 mg total) by mouth daily with breakfast. 05/09/21  Yes Wendie Agreste, MD  dapagliflozin propanediol (FARXIGA) 5 MG TABS tablet Take 1 tablet (5 mg total) by mouth daily before breakfast. 05/09/21  Yes Wendie Agreste, MD  docusate sodium (COLACE) 100 MG capsule Take 1 capsule (100 mg total) by mouth daily. 03/29/21  Yes Love, Ivan Anchors, PA-C  gabapentin (NEURONTIN) 300 MG capsule Take 1 capsule (300 mg total) by mouth at bedtime. 03/29/21  Yes Love, Ivan Anchors, PA-C  metFORMIN (GLUCOPHAGE) 500 MG tablet Take 1 tablet (500 mg total) by mouth 2 (two) times daily with a meal. 05/09/21  Yes Wendie Agreste, MD  methocarbamol (ROBAXIN) 500 MG tablet Take 1 tablet (500 mg total) by mouth every 6 (six) hours as needed for muscle spasms. 03/29/21  Yes Love, Ivan Anchors, PA-C  oxyCODONE (OXY IR/ROXICODONE) 5 MG immediate release tablet Take 1 tablet (5 mg  total) by mouth 2 (two) times daily as needed for severe pain. 03/29/21  Yes Love, Ivan Anchors, PA-C  pantoprazole (PROTONIX) 40 MG tablet Take 1 tablet (40 mg total) by mouth daily. 03/29/21  Yes Love, Ivan Anchors, PA-C  rosuvastatin (CRESTOR) 10 MG tablet Take 1 tablet (10 mg total) by mouth daily after supper. 05/09/21  Yes Wendie Agreste, MD  senna-docusate (SENOKOT-S) 8.6-50 MG tablet Take 1 tablet by mouth daily after supper. Laxative--purchase over the counter 03/29/21  Yes Love, Ivan Anchors, PA-C  zinc sulfate 220 (50 Zn) MG capsule Take 1 capsule (220 mg total) by mouth daily. Purchase over the counter 03/29/21  Yes Love, Ivan Anchors, PA-C   Social History   Socioeconomic History   Marital status: Widowed     Spouse name: Not on file   Number of children: Not on file   Years of education: Not on file   Highest education level: Not on file  Occupational History   Not on file  Tobacco Use   Smoking status: Former    Types: Cigarettes    Quit date: 06/14/2018    Years since quitting: 3.0   Smokeless tobacco: Never   Tobacco comments:    pt reports quitting 2 months ago  Vaping Use   Vaping Use: Never used  Substance and Sexual Activity   Alcohol use: Not Currently    Alcohol/week: 18.0 standard drinks    Types: 18 Cans of beer per week    Comment: pt son reports not drinking for past couple years   Drug use: Never   Sexual activity: Not on file  Other Topics Concern   Not on file  Social History Narrative   Not on file   Social Determinants of Health   Financial Resource Strain: Not on file  Food Insecurity: Not on file  Transportation Needs: Not on file  Physical Activity: Not on file  Stress: Not on file  Social Connections: Not on file  Intimate Partner Violence: Not on file    Review of Systems  Constitutional:  Negative for fatigue and unexpected weight change.  Eyes:  Negative for visual disturbance.  Respiratory:  Negative for cough, chest tightness and shortness of breath.   Cardiovascular:  Negative for chest pain, palpitations and leg swelling.  Gastrointestinal:  Negative for abdominal pain and blood in stool.  Neurological:  Negative for dizziness, light-headedness and headaches.    Objective:   Vitals:   07/04/21 1341 07/04/21 1346  BP: (!) 144/82 138/78  Pulse: 68   Resp: 16   Temp: 98 F (36.7 C)   TempSrc: Temporal   SpO2: 97%   Weight: 148 lb (67.1 kg)   Height: $Remove'5\' 10"'YVEpRKZ$  (1.778 m)      Physical Exam Vitals reviewed.  Constitutional:      Appearance: He is well-developed.  HENT:     Head: Normocephalic and atraumatic.  Neck:     Vascular: No carotid bruit or JVD.  Cardiovascular:     Rate and Rhythm: Normal rate and regular rhythm.      Heart sounds: Normal heart sounds. No murmur heard. Pulmonary:     Effort: Pulmonary effort is normal.     Breath sounds: Normal breath sounds. No rales.  Musculoskeletal:     Left lower leg: No edema.     Comments: S/p R AKA. Wound well healed.   Skin:    General: Skin is warm and dry.  Neurological:     Mental Status: He  is alert and oriented to person, place, and time.  Psychiatric:        Mood and Affect: Mood normal.   Results for orders placed or performed in visit on 07/04/21  POCT glucose (manual entry)  Result Value Ref Range   POC Glucose 161 (A) 70 - 99 mg/dl  POCT glycosylated hemoglobin (Hb A1C)  Result Value Ref Range   Hemoglobin A1C 7.5 (A) 4.0 - 5.6 %   HbA1c POC (<> result, manual entry)     HbA1c, POC (prediabetic range)     HbA1c, POC (controlled diabetic range)      Assessment & Plan:  Kingdom Vanzanten is a 60 y.o. male . Type 2 diabetes mellitus with other circulatory complication, without long-term current use of insulin ([place]) - Plan: POCT glucose (manual entry), POCT glycosylated hemoglobin (Hb A1C), Ambulatory referral to Ophthalmology, Comprehensive metabolic panel, Lipid panel  -A1c improved.  More consistent meals discussed and handout given on diabetes nutrition.  Declined meeting with nutritionist.  Continue metformin 500 mg twice daily with meals, recheck A1c/office visit 3 months.  Refer to ophthalmology for diabetic retinopathy screening.  Recommended he discuss his previous concerns last exam with ophthalmologist at visit prior to dilation.  Need for influenza vaccination - Plan: Flu Vaccine QUAD 6+ mos PF IM (Fluarix Quad PF)  Need for shingles vaccine - Plan: Varicella-zoster vaccine IM   No orders of the defined types were placed in this encounter.  Patient Instructions  A1c is better today can continue metformin - 2 times per day if eating more regular meals. I do recommend more regular meals. See information below. Good luck on upcoming  surgery! I will refer you for eye exam, but let them know about the issue with previous exam and dilation.    ???????? ?????? ? ??????? ? ???????? Diabetes Mellitus and Nutrition, Adult ???? ?? ????????? ???????? ????????, ????? ????? ????? ???????? ???????? ???????, ?????? ??? ?????? ?????? (???????) ? ??? ? ????? ?????? ??????? ?? ????, ??? ?? ????? ? ?????. ??????? ???????? ????? ? ?????????? ???????????, ? ???? ? ?? ?? ????? ?????? ????, ????? ?????? ???: ?????????????? ??????? ??????? ? ?????. ??????? ???? ??????????? ??????. ???????? ???????????? ????????????? ????????. ??????? ?? ????? ??? ???????????? ?????????? ???. ??? ????? ???????? ?? ??? ???? ???????? ??? ??????? ???????? -- ??????, ? ?????? ??????? ????? ???? ??????????? ? ????? ???????. ???? ????? ??????????????? ?????????? ? ?????????, ????? ??????????? ??????? ???????, ????????? ??????? ?????????? ??? ???. ??? ???? ??????? ????? ??????????? ? ??????????? ?? ????????? ????????: ??????????? ? ????????. ??????????? ????????????? ??????????. ???? ????. ??????? ??????? ?????, ????????????? ???????? ? ?????? ???????????. ?????? ?????????? ??????????. ??????? ?????? ???????????, ????? ??? ??????????? ?????? ??? ?????. ??? ???????? ?????? ?? ??? ????????? ???????? ?????? ?? ??????? ??????? ? ????? ??????, ??? ????? ?????? ????. ???????????? ??????????? ????????? ? ????? ???????????? ?????????? ?????????? ??????? ? ?????. ???? ????????? -- ??? ????? ???????????? ?????????? ????????????? ? ???? ?????????. ??????? ????????? ????? ??? ????, ????? ???????????? ?????????? ??????? ? ????? ?? ?????????? ??????, ???????? ???? ?? ????????? ??????? ??? ?????????? ?????? ????????? ???????????????????? ????????????? ?????????. ????? ?????, ??????? ????????? ????? ????????? ?????????? ?? ????? ??????? ?????? ????. ??? ?????????? ?????????? ? ??????? ????????. ??? ???????? ??????? ??????????, ??????? ????????? ??? ??????? ?????????? ? ?????? ????????  ????? ???? ? ?? ????? ????????. ????? ??????? ????????? ????????? ???????? ????? ??????? ????????? ???????? ?????? ??????? ? ????? (????????????), ???????? ???? ?? ????????? ??????? ??? ?????????? ?????? ????????? ???????????????????? ?????????. ???????????? -- ??? ?????????, ?????????????? ?????? ??? ?????. ???????? ???????????? (??????????, ?????????????? ? ??????????? ????????) ????? ? ?????????? ????? ???????????? ??????????? ?????????? ????????. ?? ????? ????????, ????: ??? ??????? ???? ????? ??? ?? ????. ?? ?????????, ???????? ????????? ??? ?????????? ????????????. ???? ?? ????? ????????: ?? ????? ???????. ?????????? ????????????? ?????????? ??:  0-1 ?????? ? ???? ??? ??????. 0-2 ?????? ? ???? ??? ??????. ??????? ?? ???, ??????? ???????? ?????????? ? ????? ???????. ? ??? ???? ?????? ????????????? ????? ??????? ????, ??????? 12 ????? (355 ??), ?????? ?????? ????, ??????? 5 ????? (148 ??) ??? ????? ????? ???????? ???????, ??????? 1 ????? (44 ??). ??? ?????????????? ????????????? ????? ????, ??????????? ????????? ??? ????????? ??? ?? ?????. ?????? ? ????, ??? ??????? ?????????, ??? ? ?????? ????? ????? ????????? ????? ?????? ? ?????? ??????????? ??? ????????? ?????????. ?????? ?????? ?? ?????????? ????? ?????? ?????? ???????? ?? ????????? ??????? ? ???????? ??????? ??????, ?????????? ?? ???????? ??????? ?????? ??????????? ????????? ? ????????. ????????? ?? ???????? ?????????? ???????, ?????????, ????? ? ?????? ??????????? ??????? ?????????? ?? ???? ??????. ? ????????? ?????? ????????? ??????? ?????????? ????? ????? ??????. ????????? ????? ?????????? ??????? (?) ????????? ? ????? ??????. ?? ?????? ????????? ?????????? ?????? ????????? ?? ???? ?????? ???????? ????? ??????? ?????? ?????????? ????????? ?? 15. ????????, ???? ????? ?????????? ????????? ? ???????? ??????? ?????????? 30 ?, ??? ????????????? 2 ??????? ?????????. ????????? ?????????? ??????? (?) ?????????? ? ?????-????? ? ????? ??????.  ????????? ???????? ? ?????? ??????????? ???? ????? ??? ?? ???????????. ????????? ?????????? ???????????? (??) ???? (??????) ? ????? ??????. ??????????? ????? ?????? ?????????? ??????????? ??????: ?? ????? 2300 ?? ? ????. ?????? ?????????? ??????? ?????? ? ?????????, ?????????? ??? ???????? ??? ????????????. ? ???? ????????? ????? ???? ?????????? ?????????? ?????????????? ????????? ??? ??????, ? ????? ????????? ??????? ????????. ????????? ????????? ?????????? ???? ??????? ???????? ?????????? ??????????? ???????, ????????????? ?? ?????????. ??????? ?????????? ?? ???????? ????????, ??????? ??????? ???????? ??? ?????????????? ???????????? ????????. ? ??? ????? ?????????? ????? ?????, ?????? ? ???????? ?????. ?????????? ???????? ???????? ??????????? ????????????. ??? ???????? ??????? ?????? ??????? ? ??????, ?????????????? ????????? ? ??????, ??????? ???? ? ?????? ???????? ?????????. ????????????? ???? ??????????? ?????????????????? ??????? ????????????? ????, ???????? ?????????, ? ?? ???????????????????, ????????, ???????? ?????. ???????? ? ?????????????? ???????? ?????, ????????, ??????????, ????????? ??? ?????????????. ????????? ????????????? ??? ???? ?????????? ?????, ?????? ??? ??????? ????. ???????????? ??????? ???? ???????? ?????? ???? ? ???????? ?????? ???? ???????????, ??????????????? ?????? ???? ? ???? ? ?? ?? ?????. ?? ??????? ????????? ?????????? ????????? ????? ???????? ????. ??????????? ???????? ? ??????? ??????????? ?????????, ????????, ?????? ??????, ?????, ???? ? ?????????????? ????????. ???????? ? ?????? ?????????, ??????? ?????? ????????? ?? ?????? ??????? ? ?????? ????? ????. ?????? ???? ???????????? 4-6 ????? (112-168 ?) ?????????? ???????? ?????, ????? ??? ???????? ????, ????????, ????, ???? ??? ????. ???? ????? ???????? ????? ?????????????: 1 ????? (28 ?) ????, ???????? ??? ????. 1 ????.  ????? (62 ?) ????. ?????????? ?????? ???? ??????????? ????????, ?????????? ????????  ????, ????????, ???????, ?????, ??????? ? ????. ????? ??????? ??????????? ????????? ?????? ?????. ??????. ?????????. ???????. ????????. ?????. ????????. ?????. ??????. ??????. ????. ?????. ????? ?????-?????. ??????. ?????? ??????, ??????? ?????? ???????, ?????? ? ???????. ??????. ??????? ???????. ??????? ????????????. ????????. ???????. ??????? ??????. ????????. ????????? ????? ?????. ???. ??????. ???????????? ???????. ???????? ??????? ?????, ????? ??? ?????????????? ??????? ??? ???? ?? ???????? ?????, ????? ??????? (???????), ????????, ???? ? ????????. ????????? ??????? ????. ?????. ?????????? ??? ????? ???. ???? ? ?????? ????? ????????????. ????? ??? ????. ???????? ??????? ????? ? ????????. ????. ?????. ??????. ???????? ???????? ???????????? ???????? ???????? ??? ???????? ???????? ? ?????? ??????????? ????, ???????? ??????, ?????? ? ???. ????????????? ???? ????? ???? ???????? ??????? ????????? ??????? ??? ????????, ??????? ??? ????? ???????????. ??? ????????? ?????????????? ?????????? ?????????? ? ?????-?????????. ????? ????????? ??????? ????????? ?????? ???????????????? ?????? ? ???????. ????? ???????????????? ?????. ???????????? ????? ? ?????? ??? ????????? ??????. ???????? ?????????????? ????? ???? ? ?????? ???????, ????????, ????, ????????, ?????????? ???????? ? ??????. ????????? ???? ?????????????? ???????????? ?????????. ???? ? ?????? ????? ?????? ????? ????. ????? ?? ??????. ???????????? ??? ??????? ????. ?????? ?????????????. ????????? ?????????? ?????. ???????? ???????? ?????????????? ??????, ??? ??? ??????. ??????? ???????????? ???????, ????? ??? ????????? ??? ???????? ???. ????????????? ???? ????? ???? ???????? ??????? ????????? ??????? ??? ????????, ??????? ??? ??????? ????????. ??? ????????? ?????????????? ?????????? ?????????? ? ?????-?????????. ???????, ??????? ??????? ?????? ????? ????? ?? ??? ??????????? ?? ???????????? ?? ???????? ??????? ???????? ????????? ??????? ??  ??? ??????????? ? ??????????? ?? ?????? ?????? ? ???? ?????????, ???? ? ???? ???????? ???????? ? ????? ????? ??? ????? ????? ????????? ??????? ??????? ? ?????? ??? ????? ????? ?????? ??????????:   American Diabetes Association (???????????? ????????????? ??????????): [email or web address] ???????? ??????? ? ?????????? (Academy of Nutrition and Dietetics): [email or web address] of Diabetes and Digestive and Kidney Diseases (???????????? ???????? ???????, ???????? ??????????????? ??????? ? ?????): [email or web address] Association of Diabetes Care and Education Specialists (ADCES) (?????????? ????????????-????????????? ?? ???????? ??????? ????????? ???????): [email or web address] ?????? ???? ?? ?????? ???????? ????????, ????? ????? ???????? ???????? ???????, ?????? ??? ?????? ?????? (???????) ? ??? ? ????? ?????? ??????? ?? ????, ??? ?? ????? ? ?????. ???? ????????? ??????? ??????? ??? ?????????????? ??????? ??????? ? ????? ? ???????????? ???????? ????? ?????. ???? ????? ??????????????? ?????????? ? ?????????, ????? ??????????? ??????? ???????, ????????? ??????? ?????????? ??? ???. ?????? ? ????, ??? ???????? (??????) ? ???????? ????????? ???????????????? ??????????? ?? ??????? ??????? ? ?????. ????? ????? ??????? ???????????? ????????? ? ??????????? ???????? ? ?????????????. ??? ?????????? ?? ????? ???????? ??????, ??????????????? ????? ??????. ??????????? ???????? ????? ???????????? ??? ??????? ? ????? ??????? ??????. Document Revised: 09/08/2019 Document Reviewed: 09/08/2019 Elsevier Patient Education  2021 [place],   [name], MD [place] [employer] 07/04/21 10:23 PM

## 2021-07-04 NOTE — Patient Instructions (Addendum)
A1c is better today can continue metformin - 2 times per day if eating more regular meals. I do recommend more regular meals. See information below. Good luck on upcoming surgery! I will refer you for eye exam, but let them know about the issue with previous exam and dilation.    ???????? ?????? ? ??????? ? ???????? Diabetes Mellitus and Nutrition, Adult ???? ?? ????????? ???????? ????????, ????? ????? ????? ???????? ???????? ???????, ?????? ??? ?????? ?????? (???????) ? ??? ? ????? ?????? ??????? ?? ????, ??? ?? ????? ? ?????. ??????? ???????? ????? ? ?????????? ???????????, ? ???? ? ?? ?? ????? ?????? ????, ????? ?????? ???: ?????????????? ??????? ??????? ? ?????. ??????? ???? ??????????? ??????. ???????? ???????????? ????????????? ????????. ??????? ?? ????? ??? ???????????? ?????????? ???. ??? ????? ???????? ?? ??? ???? ???????? ??? ??????? ???????? -- ??????, ? ?????? ??????? ????? ???? ??????????? ? ????? ???????. ???? ????? ??????????????? ?????????? ? ?????????, ????? ??????????? ??????? ???????, ????????? ??????? ?????????? ??? ???. ??? ???? ??????? ????? ??????????? ? ??????????? ?? ????????? ????????: ??????????? ? ????????. ??????????? ????????????? ??????????. ???? ????. ??????? ??????? ?????, ????????????? ???????? ? ?????? ???????????. ?????? ?????????? ??????????. ??????? ?????? ???????????, ????? ??? ??????????? ?????? ??? ?????. ??? ???????? ?????? ?? ??? ????????? ???????? ?????? ?? ??????? ??????? ? ????? ??????, ??? ????? ?????? ????. ???????????? ??????????? ????????? ? ????? ???????????? ?????????? ?????????? ??????? ? ?????. ???? ????????? -- ??? ????? ???????????? ?????????? ????????????? ? ???? ?????????. ??????? ????????? ????? ??? ????, ????? ???????????? ?????????? ??????? ? ????? ?? ?????????? ??????, ???????? ???? ?? ????????? ??????? ??? ?????????? ?????? ????????? ???????????????????? ????????????? ?????????. ????? ?????, ??????? ????????? ????? ?????????  ?????????? ?? ????? ??????? ?????? ????. ??? ?????????? ?????????? ? ??????? ????????. ??? ???????? ??????? ??????????, ??????? ????????? ??? ??????? ?????????? ? ?????? ???????? ????? ???? ? ?? ????? ????????. ????? ??????? ????????? ????????? ???????? ????? ??????? ????????? ???????? ?????? ??????? ? ????? (????????????), ???????? ???? ?? ????????? ??????? ??? ?????????? ?????? ????????? ???????????????????? ?????????. ???????????? -- ??? ?????????, ?????????????? ?????? ??? ?????. ???????? ???????????? (??????????, ?????????????? ? ??????????? ????????) ????? ? ?????????? ????? ???????????? ??????????? ?????????? ????????. ?? ????? ????????, ????: ??? ??????? ???? ????? ??? ?? ????. ?? ?????????, ???????? ????????? ??? ?????????? ????????????. ???? ?? ????? ????????: ?? ????? ???????. ?????????? ????????????? ?????????? ??: 0-1 ?????? ? ???? ??? ??????. 0-2 ?????? ? ???? ??? ??????. ??????? ?? ???, ??????? ???????? ?????????? ? ????? ???????. ? ??? ???? ?????? ????????????? ????? ??????? ????, ??????? 12 ????? (355 ??), ?????? ?????? ????, ??????? 5 ????? (148 ??) ??? ????? ????? ???????? ???????, ??????? 1 ????? (44 ??). ??? ?????????????? ????????????? ????? ????, ??????????? ????????? ??? ????????? ??? ?? ?????. ?????? ? ????, ??? ??????? ?????????, ??? ? ?????? ????? ????? ????????? ????? ?????? ? ?????? ??????????? ??? ????????? ?????????. ?????? ?????? ?? ?????????? ????? ?????? ?????? ???????? ?? ????????? ??????? ? ???????? ??????? ??????, ?????????? ?? ???????? ??????? ?????? ??????????? ????????? ? ????????. ????????? ?? ???????? ?????????? ???????, ?????????, ????? ? ?????? ??????????? ??????? ?????????? ?? ???? ??????. ? ????????? ?????? ????????? ??????? ?????????? ????? ????? ??????. ????????? ????? ?????????? ??????? (?) ????????? ? ????? ??????. ?? ?????? ????????? ?????????? ?????? ????????? ?? ???? ?????? ???????? ????? ??????? ?????? ?????????? ????????? ?? 15. ????????,  ???? ????? ?????????? ????????? ? ???????? ??????? ?????????? 30 ?, ??? ????????????? 2 ??????? ?????????. ????????? ?????????? ??????? (?) ?????????? ? ?????-????? ? ????? ??????. ????????? ???????? ? ?????? ??????????? ???? ????? ??? ?? ???????????. ????????? ?????????? ???????????? (??) ???? (??????) ? ????? ??????. ??????????? ????? ?????? ?????????? ??????????? ??????: ?? ????? 2300 ?? ? ????. ?????? ?????????? ??????? ?????? ? ?????????, ?????????? ??? ???????? ??? ????????????. ? ???? ????????? ????? ???? ?????????? ?????????? ?????????????? ????????? ??? ??????, ? ????? ????????? ??????? ????????. ????????? ????????? ?????????? ???? ??????? ???????? ?????????? ??????????? ???????, ????????????? ?? ?????????. ??????? ?????????? ?? ???????? ????????, ??????? ??????? ???????? ??? ?????????????? ???????????? ????????. ? ??? ????? ?????????? ????? ?????, ?????? ? ???????? ?????. ?????????? ???????? ???????? ??????????? ????????????. ??? ???????? ??????? ?????? ??????? ? ??????, ?????????????? ????????? ? ??????, ??????? ???? ? ?????? ???????? ?????????. ????????????? ???? ??????????? ?????????????????? ??????? ????????????? ????, ???????? ?????????, ? ?? ???????????????????, ????????, ???????? ?????. ???????? ? ?????????????? ???????? ?????, ????????, ??????????, ????????? ??? ?????????????. ????????? ????????????? ??? ???? ?????????? ?????, ?????? ??? ??????? ????. ???????????? ??????? ???? ???????? ?????? ???? ? ???????? ?????? ???? ???????????, ??????????????? ?????? ???? ? ???? ? ?? ?? ?????. ?? ??????? ????????? ?????????? ????????? ????? ???????? ????. ??????????? ???????? ? ??????? ??????????? ?????????, ????????, ?????? ??????, ?????, ???? ? ?????????????? ????????. ???????? ? ?????? ?????????, ??????? ?????? ????????? ?? ?????? ??????? ? ?????? ????? ????. ?????? ???? ????????????  4-6 ????? (112-168 ?) ?????????? ???????? ?????, ????? ??? ???????? ????, ????????, ????, ???? ??? ????.  ???? ????? ???????? ????? ?????????????: 1 ????? (28 ?) ????, ???????? ??? ????. 1 ????.  ????? (62 ?) ????. ?????????? ?????? ???? ??????????? ????????, ?????????? ???????? ????, ????????, ???????, ?????, ??????? ? ????. ????? ??????? ??????????? ????????? ?????? ?????. ??????. ?????????. ???????. ????????. ?????. ????????. ?????. ??????. ??????. ????. ?????. ????? ?????-?????. ??????. ?????? ??????, ??????? ?????? ???????, ?????? ? ???????. ??????. ??????? ???????. ??????? ????????????. ????????. ???????. ??????? ??????. ????????. ????????? ????? ?????. ???. ??????. ???????????? ???????. ???????? ??????? ?????, ????? ??? ?????????????? ??????? ??? ???? ?? ???????? ?????, ????? ??????? (???????), ????????, ???? ? ????????. ????????? ??????? ????. ?????. ?????????? ??? ????? ???. ???? ? ?????? ????? ????????????. ????? ??? ????. ???????? ??????? ????? ? ????????. ????. ?????. ??????. ???????? ???????? ???????????? ???????? ???????? ??? ???????? ???????? ? ?????? ??????????? ????, ???????? ??????, ?????? ? ???. ????????????? ???? ????? ???? ???????? ??????? ????????? ??????? ??? ????????, ??????? ??? ????? ???????????. ??? ????????? ?????????????? ?????????? ?????????? ? ?????-?????????. ????? ????????? ??????? ????????? ?????? ???????????????? ?????? ? ???????. ????? ???????????????? ?????. ???????????? ????? ? ?????? ??? ????????? ??????. ???????? ?????????????? ????? ???? ? ?????? ???????, ????????, ????, ????????, ?????????? ???????? ? ??????. ????????? ???? ?????????????? ???????????? ?????????. ???? ? ?????? ????? ?????? ????? ????. ????? ?? ??????. ???????????? ??? ??????? ????. ?????? ?????????????. ????????? ?????????? ?????. ???????? ???????? ?????????????? ??????, ??? ??? ??????. ??????? ???????????? ???????, ????? ??? ????????? ??? ???????? ???. ????????????? ???? ????? ???? ???????? ??????? ????????? ??????? ??? ????????, ??????? ??? ??????? ????????. ??? ?????????  ?????????????? ?????????? ?????????? ? ?????-?????????. ???????, ??????? ??????? ?????? ????? ????? ?? ??? ??????????? ?? ???????????? ?? ???????? ??????? ???????? ????????? ??????? ?? ??? ??????????? ? ??????????? ?? ?????? ?????? ? ???? ?????????, ???? ? ???? ???????? ???????? ? ????? ????? ??? ????? ????? ????????? ??????? ??????? ? ?????? ??? ????? ????? ?????? ??????????: American Diabetes Association (???????????? ????????????? ??????????): [email or web address] ???????? ??????? ? ?????????? (Academy of Nutrition and Dietetics): [email or web address] of Diabetes and Digestive and Kidney Diseases (???????????? ???????? ???????, ???????? ??????????????? ??????? ? ?????): [email or web address] Association of Diabetes Care and Education Specialists (ADCES) (?????????? ????????????-????????????? ?? ???????? ??????? ????????? ???????): [email or web address] ?????? ???? ?? ?????? ???????? ????????, ????? ????? ???????? ???????? ???????, ?????? ??? ?????? ?????? (???????) ? ??? ? ????? ?????? ??????? ?? ????, ??? ?? ????? ? ?????. ???? ????????? ??????? ??????? ??? ?????????????? ??????? ??????? ? ????? ? ???????????? ???????? ????? ?????. ???? ????? ??????????????? ?????????? ? ?????????, ????? ??????????? ??????? ???????, ????????? ??????? ?????????? ??? ???. ?????? ? ????, ??? ???????? (??????) ? ???????? ????????? ???????????????? ??????????? ?? ??????? ??????? ? ?????. ????? ????? ??????? ???????????? ????????? ? ??????????? ???????? ? ?????????????. ??? ?????????? ?? ????? ???????? ??????, ??????????????? ????? ??????. ??????????? ???????? ????? ???????????? ??? ??????? ? ????? ??????? ??????. Document Revised: 09/08/2019 Document Reviewed: 09/08/2019 Elsevier Patient Education  [place].

## 2021-07-05 LAB — COMPREHENSIVE METABOLIC PANEL
ALT: 21 U/L (ref 0–53)
AST: 25 U/L (ref 0–37)
Albumin: 4.9 g/dL (ref 3.5–5.2)
Alkaline Phosphatase: 89 U/L (ref 39–117)
BUN: 17 mg/dL (ref 6–23)
CO2: 31 mEq/L (ref 19–32)
Calcium: 10 mg/dL (ref 8.4–10.5)
Chloride: 101 mEq/L (ref 96–112)
Creatinine, Ser: 0.81 mg/dL (ref 0.40–1.50)
GFR: 94.44 mL/min (ref >60.00)
Glucose, Bld: 156 mg/dL — ABNORMAL HIGH (ref 70–99)
Potassium: 4.8 mEq/L (ref 3.5–5.1)
Sodium: 140 mEq/L (ref 135–145)
Total Bilirubin: 0.6 mg/dL (ref 0.2–1.2)
Total Protein: 7.7 g/dL (ref 6.0–8.3)

## 2021-07-05 LAB — LIPID PANEL
Cholesterol: 165 mg/dL (ref 0–200)
HDL: 52 mg/dL (ref >39.00)
LDL Cholesterol: 89 mg/dL (ref 0–99)
NonHDL: 113.2
Total CHOL/HDL Ratio: 3
Triglycerides: 120 mg/dL (ref 0.0–149.0)
VLDL: 24 mg/dL (ref 0.0–40.0)

## 2021-07-15 NOTE — Progress Notes (Signed)
Established Patient Office Visit  Subjective:  Patient ID: Stanley Chaney, male    DOB: March 06, 1961  Age: 60 y.o. MRN: [id number]  CC:  Chief Complaint  Patient presents with   Follow-up    Patient is here for follow up, Patient would like to discuss surgical clearance for his leg surgery.    HPI Stanley Chaney presents for follow up   PAD in leg warrants above R knee amputation. Needs preoperative visit.  No acute concerns today.   Continues mixed compliance - overall poor compliance - with medication. His son is here today with him to help interpret.    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   Vitamin D deficiency 07/21/2020    Past Surgical History:  Procedure Laterality Date   ABDOMINAL AORTOGRAM W/LOWER EXTREMITY Bilateral 06/15/2018   Procedure: ABDOMINAL AORTOGRAM W/LOWER EXTREMITY;  Surgeon: [name], MD;  Location: [facility];  Service: Cardiovascular;  Laterality: Bilateral;   ABDOMINAL AORTOGRAM W/LOWER EXTREMITY Left 12/30/2018   Procedure: ABDOMINAL AORTOGRAM W/LOWER EXTREMITY;  Surgeon: [name], MD;  Location: [facility];  Service: Cardiovascular;  Laterality: Left;   ABDOMINAL AORTOGRAM W/LOWER EXTREMITY N/A 07/10/2020   Procedure: ABDOMINAL AORTOGRAM W/LOWER EXTREMITY;  Surgeon: [name], MD;  Location: [facility];  Service: Cardiovascular;  Laterality: N/A;  Bilateral   AMPUTATION Right 03/16/2021   Procedure: RIGHT ABOVE KNEE AMPUTATION;  Surgeon: [name], MD;  Location: [facility];  Service: Orthopedics;  Laterality: Right;   I & D EXTREMITY Left 06/12/2018   Procedure: Excisional Debridement left foot;  Surgeon: [name], MD;  Location: [facility];  Service: Orthopedics;  Laterality: Left;   IRRIGATION AND  DEBRIDEMENT KNEE Right 07/18/2020   Procedure: IRRIGATION AND DEBRIDEMENT RIGHT KNEE;  Surgeon: Altamese El Brazil, MD;  Location: [facility];  Service: Orthopedics;  Laterality: Right;   KNEE ARTHROSCOPY Right 07/18/2020   Procedure: ARTHROSCOPY KNEE SYNOVECTOMY;  Surgeon: Altamese Grand Marais, MD;  Location: [facility];  Service: Orthopedics;  Laterality: Right;   LOWER EXTREMITY ANGIOGRAM Right 07/20/2018     Right lower extremity angiogram   LOWER EXTREMITY ANGIOGRAPHY Right 07/20/2018   Procedure: Lower Extremity Angiography;  Surgeon: [name], MD;  Location: [facility];  Service: Cardiovascular;  Laterality: Right;   PERIPHERAL VASCULAR ATHERECTOMY Left 06/15/2018   Procedure: PERIPHERAL VASCULAR ATHERECTOMY;  Surgeon: [name], MD;  Location: [facility];  Service: Cardiovascular;  Laterality: Left;  SFA   PERIPHERAL VASCULAR BALLOON ANGIOPLASTY Left 06/15/2018   Procedure: PERIPHERAL VASCULAR BALLOON ANGIOPLASTY;  Surgeon: [name], MD;  Location: [facility];  Service: Cardiovascular;  Laterality: Left;  SFA   PERIPHERAL VASCULAR BALLOON ANGIOPLASTY Right 07/20/2018   Procedure: PERIPHERAL VASCULAR BALLOON ANGIOPLASTY;  Surgeon: [name], MD;  Location: [facility];  Service: Cardiovascular;  Laterality: Right;  SFA WITH DRUG COATED    PERIPHERAL VASCULAR INTERVENTION Left 12/30/2018   Procedure: PERIPHERAL VASCULAR INTERVENTION;  Surgeon: [name], MD;  Location: [facility];  Service: Cardiovascular;  Laterality: Left;  SFA    Family History  Problem Relation Age of Onset   Diabetes Mother    Cancer Mother     Social History   Socioeconomic History   Marital status: Widowed  Spouse name: Not on file   Number of children: Not on file   Years of education: Not on file   Highest education level: Not on file  Occupational History   Not on file  Tobacco Use   Smoking status:  Former    Types: Cigarettes    Quit date: 06/14/2018    Years since quitting: 3.0   Smokeless tobacco: Never   Tobacco comments:    pt reports quitting 2 months ago  Vaping Use   Vaping Use: Never used  Substance and Sexual Activity   Alcohol use: Not Currently    Alcohol/week: 18.0 standard drinks    Types: 18 Cans of beer per week    Comment: pt son reports not drinking for past couple years   Drug use: Never   Sexual activity: Not on file  Other Topics Concern   Not on file  Social History Narrative   Not on file   Social Determinants of Health   Financial Resource Strain: Not on file  Food Insecurity: Not on file  Transportation Needs: Not on file  Physical Activity: Not on file  Stress: Not on file  Social Connections: Not on file  Intimate Partner Violence: Not on file    Outpatient Medications Prior to Visit  Medication Sig Dispense Refill   blood glucose meter kit and supplies Dispense based on patient and insurance preference. Use up to four times daily as directed. (FOR ICD-10 E10.9, E11.9). 1 each 0   acetaminophen (TYLENOL) 500 MG tablet Take 1 tablet (500 mg total) by mouth every 8 (eight) hours as needed. 60 tablet 0   aspirin 81 MG EC tablet Take 1 tablet (81 mg total) by mouth daily. 90 tablet 1   Cholecalciferol 1.25 MG (50000 UT) TABS TAKE 1 TABLET BY MOUTH DAILY. (Patient not taking: Reported on 03/15/2021)     clopidogrel (PLAVIX) 75 MG tablet Take 1 tablet (75 mg total) by mouth daily with breakfast. 30 tablet 0   dapagliflozin propanediol (FARXIGA) 5 MG TABS tablet Take 1 tablet (5 mg total) by mouth daily before breakfast. 60 tablet 3   diclofenac Sodium (VOLTAREN) 1 % GEL Apply 2 g topically daily as needed (For knee pain). (Patient not taking: Reported on 03/15/2021)     gabapentin (NEURONTIN) 300 MG capsule Take 1 capsule (300 mg total) by mouth at bedtime. 90 capsule 3   HYDROcodone-acetaminophen (NORCO/VICODIN) 5-325 MG tablet Take 1-2 tablets by mouth  every 12 (twelve) hours as needed for moderate pain or severe pain. 30 tablet 0   Ibuprofen 200 MG CAPS Take 1 capsule by mouth daily as needed (For knee pain). (Patient not taking: Reported on 03/15/2021)     lisinopril (ZESTRIL) 10 MG tablet Take 1 tablet (10 mg total) by mouth daily. (Patient taking differently: Take 10 mg by mouth in the morning.) 90 tablet 3   metFORMIN (GLUCOPHAGE) 500 MG tablet Take 1 tablet (500 mg total) by mouth daily with breakfast. 180 tablet 3   rosuvastatin (CRESTOR) 10 MG tablet Take 1 tablet (10 mg total) by mouth daily. (Patient taking differently: Take 10 mg by mouth in the morning.) 90 tablet 3   No facility-administered medications prior to visit.    Allergies  Allergen Reactions   Lisinopril Other (See Comments)    Hyperkalemia/AKI    ROS Review of Systems  Constitutional: Negative.   HENT: Negative.    Eyes: Negative.   Respiratory: Negative.    Cardiovascular: Negative.   Gastrointestinal:  Negative.   Genitourinary: Negative.   Musculoskeletal: Negative.   Skin: Negative.   Neurological: Negative.   Psychiatric/Behavioral: Negative.    All other systems reviewed and are negative.    Objective:    Physical Exam Constitutional:      General: He is not in acute distress.    Appearance: Normal appearance. He is normal weight. He is not ill-appearing, toxic-appearing or diaphoretic.  Cardiovascular:     Rate and Rhythm: Normal rate and regular rhythm.     Heart sounds: Normal heart sounds. No murmur heard.   No friction rub. No gallop.  Pulmonary:     Effort: Pulmonary effort is normal. No respiratory distress.     Breath sounds: Normal breath sounds. No stridor. No wheezing, rhonchi or rales.  Chest:     Chest wall: No tenderness.  Musculoskeletal:        General: Tenderness (both lower extremities) present. No swelling, deformity or signs of injury. Normal range of motion.     Right lower leg: No edema.     Left lower leg: No edema.   Skin:    General: Skin is warm and dry.     Capillary Refill: Capillary refill takes more than 3 seconds. Lower extremities    Coloration: Skin is pale (both lower extremities). Skin is not jaundiced.     Findings: No bruising, lesion or rash.  Neurological:     General: No focal deficit present.     Mental Status: He is alert and oriented to person, place, and time. Mental status is at baseline.  Psychiatric:        Mood and Affect: Mood normal.        Behavior: Behavior normal.        Thought Content: Thought content normal.        Judgment: Judgment normal.    BP (!) 169/76   Pulse 96   Temp 98 F (36.7 C) (Temporal)   Resp 18   Ht _0  (1.651 m)   Wt 146 lb 9.6 oz (66.5 kg)   SpO2 97%   BMI 24.40 kg/m  Wt Readings from Last 3 Encounters:  07/04/21 148 lb (67.1 kg)  05/09/21 148 lb 3.2 oz (67.2 kg)  03/22/21 143 lb 11.8 oz (65.2 kg)     Health Maintenance Due  Topic Date Due   OPHTHALMOLOGY EXAM  08/31/2020   FOOT EXAM  11/16/2020    There are no preventive care reminders to display for this patient.  Lab Results  Component Value Date   TSH 0.888 11/30/2020   Lab Results  Component Value Date   WBC 5.5 03/26/2021   HGB 13.6 03/26/2021   HCT 42.7 03/26/2021   MCV 82.6 03/26/2021   PLT 379 03/26/2021   Lab Results  Component Value Date   NA 140 07/04/2021   K 4.8 07/04/2021   CO2 31 07/04/2021   GLUCOSE 156 (H) 07/04/2021   BUN 17 07/04/2021   CREATININE 0.81 07/04/2021   BILITOT 0.6 07/04/2021   ALKPHOS 89 07/04/2021   AST 25 07/04/2021   ALT 21 07/04/2021   PROT 7.7 07/04/2021   ALBUMIN 4.9 07/04/2021   CALCIUM 10.0 07/04/2021   ANIONGAP 12 03/29/2021   EGFR 91 11/30/2020   GFR 94.44 07/04/2021   Lab Results  Component Value Date   CHOL 165 07/04/2021   Lab Results  Component Value Date   HDL 52.00 07/04/2021   Lab Results  Component Value Date   LDLCALC  89 07/04/2021   Lab Results  Component Value Date   TRIG 120.0 07/04/2021    Lab Results  Component Value Date   CHOLHDL 3 07/04/2021   Lab Results  Component Value Date   HGBA1C 7.5 (A) 07/04/2021      Assessment & Plan:   Problem List Items Addressed This Visit       Endocrine   Diabetes mellitus ([place]) - Primary   Relevant Orders   POCT glycosylated hemoglobin (Hb A1C) (Completed)   Other Visit Diagnoses     Preoperative clearance       Relevant Orders   EKG 12-Lead (Completed)   CBC With Differential (Completed)   Comprehensive metabolic panel (Completed)   Lipid panel (Completed)   TSH (Completed)   Protime-INR (Completed)   Urinalysis (Completed)       No orders of the defined types were placed in this encounter.   Follow-up: No follow-ups on file.   PLAN Labs collected. Will follow up with the patient as warranted. Ekg collected. Compared to previous reading on 07/10/20. Notable for single PAC. No acute changes since previous readings. Discussed in depth upcoming surgery with patient and son who voice understanding Patient encouraged to call clinic with any questions, comments, or concerns.  Maximiano Coss, NP

## 2021-10-10 ENCOUNTER — Ambulatory Visit: Payer: 59 | Admitting: Family Medicine

## 2021-10-18 NOTE — Telephone Encounter (Signed)
Erroneous encounter. Please disregard.

## 2022-02-16 ENCOUNTER — Other Ambulatory Visit: Payer: Self-pay | Admitting: Family Medicine

## 2022-02-16 DIAGNOSIS — I739 Peripheral vascular disease, unspecified: Secondary | ICD-10-CM

## 2022-02-16 DIAGNOSIS — E1159 Type 2 diabetes mellitus with other circulatory complications: Secondary | ICD-10-CM

## 2023-04-11 ENCOUNTER — Other Ambulatory Visit: Payer: Self-pay
